# Patient Record
Sex: Male | Born: 1980
Health system: Southern US, Community
[De-identification: ages and names within clinical notes are randomized; demographics above are authoritative.]

## PROBLEM LIST (undated history)

## (undated) DIAGNOSIS — M549 Dorsalgia, unspecified: Secondary | ICD-10-CM

## (undated) DIAGNOSIS — F1120 Opioid dependence, uncomplicated: Secondary | ICD-10-CM

## (undated) DIAGNOSIS — F988 Other specified behavioral and emotional disorders with onset usually occurring in childhood and adolescence: Secondary | ICD-10-CM

## (undated) DIAGNOSIS — F319 Bipolar disorder, unspecified: Secondary | ICD-10-CM

## (undated) DIAGNOSIS — G629 Polyneuropathy, unspecified: Secondary | ICD-10-CM

## (undated) DIAGNOSIS — G8929 Other chronic pain: Secondary | ICD-10-CM

## (undated) HISTORY — PX: MOUTH SURGERY: SHX715

## (undated) HISTORY — PX: INCISION AND DRAINAGE: SHX5863

---

## 2006-02-05 ENCOUNTER — Ambulatory Visit: Payer: Self-pay | Admitting: "Endocrinology

## 2006-02-13 ENCOUNTER — Encounter: Admission: RE | Admit: 2006-02-13 | Discharge: 2006-02-13 | Payer: Self-pay | Admitting: "Endocrinology

## 2006-09-16 ENCOUNTER — Emergency Department (HOSPITAL_COMMUNITY): Admission: EM | Admit: 2006-09-16 | Discharge: 2006-09-16 | Payer: Self-pay | Admitting: Emergency Medicine

## 2007-12-19 ENCOUNTER — Ambulatory Visit (HOSPITAL_COMMUNITY): Admission: RE | Admit: 2007-12-19 | Discharge: 2007-12-19 | Payer: Self-pay | Admitting: Pulmonary Disease

## 2008-03-16 ENCOUNTER — Inpatient Hospital Stay (HOSPITAL_COMMUNITY): Admission: EM | Admit: 2008-03-16 | Discharge: 2008-03-17 | Payer: Self-pay | Admitting: Emergency Medicine

## 2008-04-13 ENCOUNTER — Emergency Department (HOSPITAL_COMMUNITY): Admission: EM | Admit: 2008-04-13 | Discharge: 2008-04-13 | Payer: Self-pay | Admitting: Emergency Medicine

## 2010-06-18 ENCOUNTER — Emergency Department (HOSPITAL_COMMUNITY): Admission: EM | Admit: 2010-06-18 | Discharge: 2010-06-18 | Payer: Self-pay | Admitting: Emergency Medicine

## 2010-06-19 ENCOUNTER — Inpatient Hospital Stay (HOSPITAL_COMMUNITY): Admission: AD | Admit: 2010-06-19 | Discharge: 2010-06-24 | Payer: Self-pay | Admitting: Pulmonary Disease

## 2010-06-19 ENCOUNTER — Ambulatory Visit: Payer: Self-pay | Admitting: Internal Medicine

## 2010-06-19 ENCOUNTER — Ambulatory Visit: Payer: Self-pay | Admitting: Cardiology

## 2010-06-20 ENCOUNTER — Encounter (INDEPENDENT_AMBULATORY_CARE_PROVIDER_SITE_OTHER): Payer: Self-pay | Admitting: Internal Medicine

## 2010-07-26 ENCOUNTER — Inpatient Hospital Stay (HOSPITAL_COMMUNITY): Admission: EM | Admit: 2010-07-26 | Discharge: 2010-07-28 | Payer: Self-pay | Admitting: Emergency Medicine

## 2010-08-12 ENCOUNTER — Ambulatory Visit: Payer: Self-pay | Admitting: Psychiatry

## 2010-12-29 ENCOUNTER — Emergency Department (HOSPITAL_COMMUNITY)
Admission: EM | Admit: 2010-12-29 | Discharge: 2010-12-30 | Disposition: A | Discharge status: Home / Self Care | Payer: Self-pay | Attending: Emergency Medicine | Admitting: Emergency Medicine

## 2010-12-29 DIAGNOSIS — E1069 Type 1 diabetes mellitus with other specified complication: Secondary | ICD-10-CM | POA: Insufficient documentation

## 2010-12-29 DIAGNOSIS — Z794 Long term (current) use of insulin: Secondary | ICD-10-CM | POA: Insufficient documentation

## 2010-12-29 DIAGNOSIS — E860 Dehydration: Secondary | ICD-10-CM | POA: Insufficient documentation

## 2010-12-29 LAB — DIFFERENTIAL
Basophils Absolute: 0 10*3/uL (ref 0.0–0.1)
Basophils Relative: 0 % (ref 0–1)
Lymphocytes Relative: 24 % (ref 12–46)
Monocytes Relative: 3 % (ref 3–12)
Neutro Abs: 7.5 10*3/uL (ref 1.7–7.7)
Neutrophils Relative %: 70 % (ref 43–77)

## 2010-12-29 LAB — CBC
HCT: 40.3 % (ref 39.0–52.0)
Hemoglobin: 13.2 g/dL (ref 13.0–17.0)
RBC: 4.67 MIL/uL (ref 4.22–5.81)

## 2010-12-29 LAB — HEPATIC FUNCTION PANEL
Albumin: 3.9 g/dL (ref 3.5–5.2)
Total Protein: 6.9 g/dL (ref 6.0–8.3)

## 2010-12-29 LAB — BLOOD GAS, VENOUS
FIO2: 0.21 %
O2 Saturation: 73.7 %
pCO2, Ven: 41.5 mmHg — ABNORMAL LOW (ref 45.0–50.0)

## 2010-12-29 LAB — URINALYSIS, ROUTINE W REFLEX MICROSCOPIC
Leukocytes, UA: NEGATIVE
Urine Glucose, Fasting: >1000 mg/dL — AB
pH: 6 (ref 5.0–8.0)

## 2010-12-29 LAB — POCT I-STAT, CHEM 8
BUN: 23 mg/dL (ref 6–23)
Calcium, Ion: 1.12 mmol/L (ref 1.12–1.32)
Chloride: 100 mEq/L (ref 96–112)
HCT: 38 % — ABNORMAL LOW (ref 39.0–52.0)
Potassium: 3.6 mEq/L (ref 3.5–5.1)
TCO2: 23 mmol/L (ref 0–100)

## 2010-12-29 LAB — URINE MICROSCOPIC-ADD ON

## 2010-12-29 LAB — BASIC METABOLIC PANEL
Chloride: 90 mEq/L — ABNORMAL LOW (ref 96–112)
Creatinine, Ser: 1.69 mg/dL — ABNORMAL HIGH (ref 0.4–1.5)
GFR calc Af Amer: 58 mL/min — ABNORMAL LOW (ref >60)
Potassium: 6.7 mEq/L (ref 3.5–5.1)
Sodium: 126 mEq/L — ABNORMAL LOW (ref 135–145)

## 2010-12-29 LAB — RAPID URINE DRUG SCREEN, HOSP PERFORMED
Barbiturates: NOT DETECTED
Benzodiazepines: POSITIVE — AB

## 2010-12-29 LAB — GLUCOSE, CAPILLARY: Glucose-Capillary: 552 mg/dL (ref 70–99)

## 2010-12-29 LAB — ETHANOL: Alcohol, Ethyl (B): <5 mg/dL (ref 0–10)

## 2010-12-29 LAB — LIPASE, BLOOD: Lipase: 19 U/L (ref 11–59)

## 2010-12-30 LAB — POCT I-STAT, CHEM 8
BUN: 23 mg/dL (ref 6–23)
Calcium, Ion: 1.13 mmol/L (ref 1.12–1.32)
Creatinine, Ser: 1.1 mg/dL (ref 0.4–1.5)
Glucose, Bld: 197 mg/dL — ABNORMAL HIGH (ref 70–99)
Hemoglobin: 12.2 g/dL — ABNORMAL LOW (ref 13.0–17.0)
TCO2: 22 mmol/L (ref 0–100)

## 2011-01-07 ENCOUNTER — Inpatient Hospital Stay (HOSPITAL_COMMUNITY)
Admission: EM | Admit: 2011-01-07 | Discharge: 2011-01-10 | DRG: 638 | Disposition: A | Discharge status: Home / Self Care | Payer: Self-pay | Attending: Pulmonary Disease | Admitting: Pulmonary Disease

## 2011-01-07 DIAGNOSIS — Z5989 Other problems related to housing and economic circumstances: Secondary | ICD-10-CM

## 2011-01-07 DIAGNOSIS — Z5987 Material hardship due to limited financial resources, not elsewhere classified: Secondary | ICD-10-CM

## 2011-01-07 DIAGNOSIS — E1065 Type 1 diabetes mellitus with hyperglycemia: Principal | ICD-10-CM | POA: Diagnosis present

## 2011-01-07 DIAGNOSIS — Z598 Other problems related to housing and economic circumstances: Secondary | ICD-10-CM

## 2011-01-07 DIAGNOSIS — G8929 Other chronic pain: Secondary | ICD-10-CM | POA: Diagnosis present

## 2011-01-07 DIAGNOSIS — Z794 Long term (current) use of insulin: Secondary | ICD-10-CM

## 2011-01-07 DIAGNOSIS — M79609 Pain in unspecified limb: Secondary | ICD-10-CM | POA: Diagnosis present

## 2011-01-07 DIAGNOSIS — M549 Dorsalgia, unspecified: Secondary | ICD-10-CM | POA: Diagnosis present

## 2011-01-07 DIAGNOSIS — IMO0002 Reserved for concepts with insufficient information to code with codable children: Principal | ICD-10-CM | POA: Diagnosis present

## 2011-01-07 DIAGNOSIS — E871 Hypo-osmolality and hyponatremia: Secondary | ICD-10-CM | POA: Diagnosis present

## 2011-01-07 LAB — DIFFERENTIAL
Basophils Relative: 0 % (ref 0–1)
Eosinophils Absolute: 0.2 10*3/uL (ref 0.0–0.7)
Eosinophils Relative: 1 % (ref 0–5)
Lymphs Abs: 2.8 10*3/uL (ref 0.7–4.0)
Neutrophils Relative %: 72 % (ref 43–77)

## 2011-01-07 LAB — GLUCOSE, CAPILLARY
Glucose-Capillary: >600 mg/dL (ref 70–99)
Glucose-Capillary: >600 mg/dL (ref 70–99)
Glucose-Capillary: 242 mg/dL — ABNORMAL HIGH (ref 70–99)
Glucose-Capillary: 231 mg/dL — ABNORMAL HIGH (ref 70–99)
Glucose-Capillary: 121 mg/dL — ABNORMAL HIGH (ref 70–99)
Glucose-Capillary: 79 mg/dL (ref 70–99)

## 2011-01-07 LAB — BASIC METABOLIC PANEL
BUN: 16 mg/dL (ref 6–23)
Creatinine, Ser: 1.02 mg/dL (ref 0.4–1.5)
GFR calc Af Amer: >60 mL/min (ref >60)
GFR calc non Af Amer: >60 mL/min (ref >60)
Potassium: 5 mEq/L (ref 3.5–5.1)

## 2011-01-07 LAB — CBC
MCH: 27.8 pg (ref 26.0–34.0)
MCV: 86.6 fL (ref 78.0–100.0)
Platelets: 303 10*3/uL (ref 150–400)
RDW: 14.6 % (ref 11.5–15.5)
WBC: 12.9 10*3/uL — ABNORMAL HIGH (ref 4.0–10.5)

## 2011-01-07 LAB — URINALYSIS, ROUTINE W REFLEX MICROSCOPIC
Hgb urine dipstick: NEGATIVE
Leukocytes, UA: NEGATIVE
Protein, ur: NEGATIVE mg/dL
Specific Gravity, Urine: 1.01 (ref 1.005–1.030)
Urine Glucose, Fasting: >1000 mg/dL — AB
pH: 7 (ref 5.0–8.0)

## 2011-01-07 LAB — BLOOD GAS, ARTERIAL
Acid-base deficit: 0.6 mmol/L (ref 0.0–2.0)
Bicarbonate: 23.2 mEq/L (ref 20.0–24.0)
O2 Saturation: 97.6 %
TCO2: 21 mmol/L (ref 0–100)
pCO2 arterial: 36.2 mmHg (ref 35.0–45.0)
pO2, Arterial: 91 mmHg (ref 80.0–100.0)

## 2011-01-07 LAB — URINE MICROSCOPIC-ADD ON

## 2011-01-07 LAB — MRSA PCR SCREENING: MRSA by PCR: NEGATIVE

## 2011-01-07 LAB — KETONES, QUALITATIVE

## 2011-01-08 LAB — GLUCOSE, CAPILLARY
Glucose-Capillary: 72 mg/dL (ref 70–99)
Glucose-Capillary: 95 mg/dL (ref 70–99)

## 2011-01-08 LAB — COMPREHENSIVE METABOLIC PANEL
ALT: 31 U/L (ref 0–53)
AST: 21 U/L (ref 0–37)
Albumin: 3.3 g/dL — ABNORMAL LOW (ref 3.5–5.2)
Alkaline Phosphatase: 58 U/L (ref 39–117)
Chloride: 100 mEq/L (ref 96–112)
Creatinine, Ser: 0.75 mg/dL (ref 0.4–1.5)
GFR calc Af Amer: >60 mL/min (ref >60)
Potassium: 3.4 mEq/L — ABNORMAL LOW (ref 3.5–5.1)
Sodium: 136 mEq/L (ref 135–145)
Total Bilirubin: 0.5 mg/dL (ref 0.3–1.2)

## 2011-01-09 LAB — GLUCOSE, CAPILLARY
Glucose-Capillary: 112 mg/dL — ABNORMAL HIGH (ref 70–99)
Glucose-Capillary: 232 mg/dL — ABNORMAL HIGH (ref 70–99)
Glucose-Capillary: 238 mg/dL — ABNORMAL HIGH (ref 70–99)
Glucose-Capillary: 413 mg/dL — ABNORMAL HIGH (ref 70–99)
Glucose-Capillary: 47 mg/dL — ABNORMAL LOW (ref 70–99)

## 2011-01-09 LAB — HEMOGLOBIN A1C: Hgb A1c MFr Bld: 10.2 % — ABNORMAL HIGH (ref <5.7)

## 2011-01-10 LAB — GLUCOSE, CAPILLARY: Glucose-Capillary: 403 mg/dL — ABNORMAL HIGH (ref 70–99)

## 2011-01-11 LAB — GLUCOSE, CAPILLARY: Glucose-Capillary: 25 mg/dL — CL (ref 70–99)

## 2011-01-15 NOTE — Discharge Summary (Signed)
  NAME:  Tyler Mann, Tyler Mann NO.:  [id number]  MEDICAL RECORD NO.:  [id number]           PATIENT TYPE:  I  LOCATION:  A210                          FACILITY:  [facility]  PHYSICIAN:  Garrison Michie L. Juanetta Gosling, M.D.DATE OF BIRTH:  1981-02-08  DATE OF ADMISSION:  01/07/2011 DATE OF DISCHARGE:  02/22/2012LH                              DISCHARGE SUMMARY   FINAL DISCHARGE DIAGNOSES: 1. Uncontrolled diabetes. 2. Chronic back and leg pain.  HISTORY:  This is a 30 year old who came to the emergency room with elevated blood sugar of about 762.  He said he had increasing pain in his legs and had increased blood sugar because of that.  He has been taking Neurontin and narcotics.  He treats his diabetes mostly with a sliding scale and with NPH.  He has significant financial difficulties and has been unable to afford some of his medications which has caused some of his problems with his blood sugar.  Exam showed he was alert, in no acute distress.  Pupils were reactive. Nose and throat were clear.  Neck was supple.  His blood sugar was 762. Sodium was 127, potassium of 5, bicarb of 23, BUN of 16, creatinine 1.02.  Blood gas showed a pH of 7.42, pCO2 of 36, pO2 of 91.  Urine showed a lot of glucose.  White blood count was 12,900, hemoglobin of 12, platelets 303.  HOSPITAL COURSE:  He was treated with insulin given IV fluids and pain medications.  Consultation was obtained with Dr. Fransico Him, endocrinologist and Dr. Fransico Him made a number of adjustments in his medications.  By the time of discharge, he was in much improved condition and he was discharged home on acetaminophen 325 mg 4 times a day as needed for pain, gabapentin 300 mg 2 tablets 4 times a day, NovoLog 10 units 3 times a day with meals and NovoLog sliding scale in addition.  Novolin N 6 units daily before supper and 8 units before breakfast, zolpidem 10 mg at bedtime and hydrocodone 10/325 q.i.d. p.r.n. pain.  He was  given prescriptions for his hydrocodone, Ambien and gabapentin with the understanding that he will need to follow in my office.  I did not give him any refills and he will need to have a urine drug screen at that point.     Shelbey Spindler L. Juanetta Gosling, M.D.     ELH/MEDQ  D:  01/10/2011  T:  01/11/2011  Job:  [id number]  Electronically Signed by Kari Baars M.D. on 01/15/2011 02:13:20 PM

## 2011-01-15 NOTE — Progress Notes (Signed)
  NAME:  Tyler Mann, Tyler Mann NO.:  [id number]  MEDICAL RECORD NO.:  [id number]           PATIENT TYPE:  I  LOCATION:  IC11                          FACILITY:  [facility]  PHYSICIAN:  Elmo Rio L. Juanetta Gosling, M.D.DATE OF BIRTH:  02-20-1981  DATE OF PROCEDURE: DATE OF DISCHARGE:                                PROGRESS NOTE   [name] was admitted yesterday with uncontrolled diabetes.  He has been using narcotics off the street and Neurontin off the street.  He says he is addicted to pain medication, I believe that he is.  He has been through rehab on several occasions and has not been successful yet. I asked him today if he would be willing to consider rehab again and he has not made a comment to that.  His blood sugar still up.  Overall, he looks about the same.  His chest is relatively clear.  Blood pressure in the 120s.  His blood sugar is better.  He is off the Glucommander at this point and my plan then, I am going to go and ask Dr. Fransico Him, the endocrinologist to take a look at it.  I have added some Neurontin and I will ask Social Work to see him and see if he is going to make any sort of a commitment to rehabilitation.     Damoney Julia L. Juanetta Gosling, M.D.     Gwenlyn Found  D:  01/08/2011  T:  01/08/2011  Job:  [id number]  Electronically Signed by Kari Baars M.D. on 01/15/2011 02:13:24 PM

## 2011-01-15 NOTE — Progress Notes (Signed)
  NAME:  Tyler Mann, Tyler Mann NO.:  [id number]  MEDICAL RECORD NO.:  [id number]           PATIENT TYPE:  LOCATION:                                 FACILITY:  PHYSICIAN:  Ashyia Schraeder L. Juanetta Gosling, M.D.DATE OF BIRTH:  [birth year]  DATE OF PROCEDURE: DATE OF DISCHARGE:                                PROGRESS NOTE   ADDENDUM  [name]'s mother called me after I spoke to him and said that she thinks that he is committed to going to rehab that they had discussed the possibility of going to [facility] for help with detox from narcotics, but also so that he will have someone to manage his blood sugar.  I think if we can make that work, that is certainly a reasonable possibility.  I would like for [name] to make it definite commitment to try this, but I have gone ahead and talked to our social worker about seeing if anything is available.     Alexys Lobello L. Juanetta Gosling, M.D.     Gwenlyn Found  D:  01/08/2011  T:  01/08/2011  Job:  [id number]  Electronically Signed by Kari Baars M.D. on 01/15/2011 02:13:30 PM

## 2011-01-15 NOTE — Progress Notes (Signed)
  NAME:  DEMETRIA, LIGHTSEY NO.:  [id number]  MEDICAL RECORD NO.:  [id number]           PATIENT TYPE:  I  LOCATION:  IC11                          FACILITY:  [facility]  PHYSICIAN:  Jess Sulak L. Juanetta Gosling, M.D.DATE OF BIRTH:  [birth year]  DATE OF PROCEDURE: DATE OF DISCHARGE:                                PROGRESS NOTE   Mr. Lothrop is better.  He continues to have some problems with his blood sugar and he had a blood sugar of 301 at 1:15 in the morning, 47 at midnight, last night 95 at 2200 last night.  This morning which is not out yet.  Otherwise, he looks about the same.  He says he did not sleep very well last night and is requesting something for sleep.  I think overall he is improved.  He is still hyperglycemic, but still working on trying to see if we can do something with his rehabilitation status.  I think he can move from the step-down unit today.     Ivan Maskell L. Juanetta Gosling, M.D.     ELH/MEDQ  D:  01/09/2011  T:  01/09/2011  Job:  [id number]  Electronically Signed by Kari Baars M.D. on 01/15/2011 02:13:35 PM

## 2011-01-15 NOTE — Progress Notes (Signed)
  NAME:  Tyler Mann, Tyler Mann NO.:  [id number]  MEDICAL RECORD NO.:  [id number]           PATIENT TYPE:  I  LOCATION:  A210                          FACILITY:  [facility]  PHYSICIAN:  Mason Burleigh L. Juanetta Gosling, M.D.DATE OF BIRTH:  24-Jan-1981  DATE OF PROCEDURE: DATE OF DISCHARGE:  01/10/2011                                PROGRESS NOTE   [name] is overall about the same.  He still has blood sugars that have been going up and down, but he looks better and he wants to go home.  I am going to go ahead and discussed this with Dr. Fransico Him and after my discussion with Dr. Fransico Him, he did agree that [name] could to be discharged, so I am going to plan to discharge him home.  Please see discharge summary for details.  He will follow up in my office and with Dr. Fransico Him.     Nelle Sayed L. Juanetta Gosling, M.D.     ELH/MEDQ  D:  01/10/2011  T:  01/10/2011  Job:  [id number]  Electronically Signed by Kari Baars M.D. on 01/15/2011 02:13:39 PM

## 2011-01-21 NOTE — H&P (Signed)
NAME:  Tyler Mann, Tyler Mann NO.:  [id number]  MEDICAL RECORD NO.:  [id number]          PATIENT TYPE:  LOCATION:                                 FACILITY:  PHYSICIAN:  Kingsley Callander. Ouida Sills, MD       DATE OF BIRTH:  Jul 13, 1981  DATE OF ADMISSION: DATE OF DISCHARGE:  LH                             HISTORY & PHYSICAL   CHIEF COMPLAINT:  High blood sugar and pain in the legs.  HISTORY OF PRESENT ILLNESS:  This patient is a 30 year old white male patient of Dr. Juanetta Gosling who presented to the emergency room with an elevated blood sugar found to be 762 on his MET-7.  He complained of his glucose being higher because of the increasing pain in his legs which he attributes to being off of street procured narcotics.  The patient describes a history of diabetes for approximately 6 years.  He has had neuropathic pain in his legs which he has resorted to treating with primarily Opana which he uses 2 doses of approximately 150 mg daily when he is able to get off of the street.  He last took any pain medications approximately 36 hours prior to presentation.  He describes having recently been in the emergency room with his glucose over 700 approximately a week ago.  He has taken Neurontin often at doses as high as 1600 mg at a the time for pain in his legs.  He has not had a prescription for either or Neurontin.  He treats his diabetes with a sliding scale.  He uses doses of NPH and regular insulin.  PAST MEDICAL HISTORY: 1. Diabetes. 2. Drug abuse.  He denies any recent IV drug use.  He has most     recently used marijuana.  He states has been off of cocaine since a     stent and rehab.  He was last hospitalized at [facility] with DKA     approximately 9 months ago, he states.  MEDICATIONS:  NPH and regular insulin.  ALLERGIES:  None.  SOCIAL HISTORY:  He denies alcohol use.  He is a smoker.  He denies any other drug use now.  He has worked as a Investment banker, operational.  FAMILY HISTORY:  Remarkable for  multiple sclerosis in his mother.  REVIEW OF SYSTEMS:  No fever, vomiting, syncope, or chest pain, states that he generally feels terrible all over.  PHYSICAL EXAMINATION:  VITAL SIGNS:  Temperature 98.0, blood pressure 135/85, pulse 84, respirations 18, oxygen saturation 100%. GENERAL:  Alert and in no acute distress. HEENT:  Pupils are dilated and reactive.  Oropharynx unremarkable. NECK:  Supple with no JVD or thyromegaly. LUNGS:  Clear. HEART:  Regular with no murmurs. ABDOMEN:  Nontender with no hepatosplenomegaly. GU: Deferred. EXTREMITIES:  No cyanosis, clubbing, or edema. NEURO:  No focal weakness. LYMPH NODES:  No cervical or supraclavicular enlargement. SKIN:  Multiple scabbed excoriations are present on his extremities.  LABORATORY DATA:  Glucose 762.  Sodium 127, potassium 5.0, bicarb 23, calcium 8.7, BUN 16, creatinine 1.02.  His ABG reveals a pH of 7.42, pCO2 36, and pO2 of 91 with  a bicarb of 21.  Urinalysis reveals greater than 100 mg per dL of glucose with trace ketones and negative protein. His blood acetone is small.  CBC reveals a white count 12.9 with a hemoglobin of 12, and platelets of 303,000  IMPRESSION/PLAN: 1. Uncontrolled diabetes.  He is not acidotic but is quite     hyperglycemic.  He will be admitted and treated with IV fluids and     IV insulin using the Glucommander protocol. 2. Drug abuse.  He will be treated with IV morphine initially on an as-     needed basis.  He will be started on a regular dosing schedule of     Neurontin. 3. Hyponatremia. 4. History of IV drug use.  He states he has been HIV negative.     Kingsley Callander. Ouida Sills, MD     ROF/MEDQ  D:  01/07/2011  T:  01/08/2011  Job:  [id number]  Electronically Signed by Carylon Perches MD on 01/21/2011 11:18:24 PM

## 2011-01-27 ENCOUNTER — Inpatient Hospital Stay (HOSPITAL_COMMUNITY)
Admission: EM | Admit: 2011-01-27 | Discharge: 2011-01-31 | DRG: 639 | Disposition: A | Discharge status: Home / Self Care | Payer: Self-pay | Attending: Pulmonary Disease | Admitting: Pulmonary Disease

## 2011-01-27 DIAGNOSIS — E1010 Type 1 diabetes mellitus with ketoacidosis without coma: Principal | ICD-10-CM | POA: Diagnosis present

## 2011-01-27 DIAGNOSIS — F1410 Cocaine abuse, uncomplicated: Secondary | ICD-10-CM | POA: Diagnosis present

## 2011-01-27 DIAGNOSIS — G8929 Other chronic pain: Secondary | ICD-10-CM | POA: Diagnosis present

## 2011-01-27 DIAGNOSIS — Z794 Long term (current) use of insulin: Secondary | ICD-10-CM

## 2011-01-27 DIAGNOSIS — E860 Dehydration: Secondary | ICD-10-CM | POA: Diagnosis present

## 2011-01-27 DIAGNOSIS — F1210 Cannabis abuse, uncomplicated: Secondary | ICD-10-CM | POA: Diagnosis present

## 2011-01-27 DIAGNOSIS — F1110 Opioid abuse, uncomplicated: Secondary | ICD-10-CM | POA: Diagnosis present

## 2011-01-27 LAB — BASIC METABOLIC PANEL
BUN: 18 mg/dL (ref 6–23)
BUN: 15 mg/dL (ref 6–23)
CO2: 11 mEq/L — ABNORMAL LOW (ref 19–32)
Chloride: 93 mEq/L — ABNORMAL LOW (ref 96–112)
Chloride: 105 mEq/L (ref 96–112)
Creatinine, Ser: 1.66 mg/dL — ABNORMAL HIGH (ref 0.4–1.5)
GFR calc non Af Amer: >60 mL/min (ref >60)
Glucose, Bld: 623 mg/dL (ref 70–99)
Glucose, Bld: 201 mg/dL — ABNORMAL HIGH (ref 70–99)
Potassium: 4.6 mEq/L (ref 3.5–5.1)
Sodium: 138 mEq/L (ref 135–145)

## 2011-01-27 LAB — GLUCOSE, CAPILLARY
Glucose-Capillary: 167 mg/dL — ABNORMAL HIGH (ref 70–99)
Glucose-Capillary: 235 mg/dL — ABNORMAL HIGH (ref 70–99)
Glucose-Capillary: 250 mg/dL — ABNORMAL HIGH (ref 70–99)
Glucose-Capillary: 126 mg/dL — ABNORMAL HIGH (ref 70–99)

## 2011-01-27 LAB — URINALYSIS, ROUTINE W REFLEX MICROSCOPIC
Ketones, ur: >80 mg/dL — AB
Leukocytes, UA: NEGATIVE
Protein, ur: NEGATIVE mg/dL
Urobilinogen, UA: 0.2 mg/dL (ref 0.0–1.0)

## 2011-01-27 LAB — DIFFERENTIAL
Eosinophils Absolute: 0.2 10*3/uL (ref 0.0–0.7)
Lymphs Abs: 4 10*3/uL (ref 0.7–4.0)
Monocytes Relative: 4 % (ref 3–12)
Neutro Abs: 12.5 10*3/uL — ABNORMAL HIGH (ref 1.7–7.7)
Neutrophils Relative %: 72 % (ref 43–77)

## 2011-01-27 LAB — CBC
HCT: 41.7 % (ref 39.0–52.0)
Hemoglobin: 13.5 g/dL (ref 13.0–17.0)
MCH: 28.1 pg (ref 26.0–34.0)
MCV: 86.7 fL (ref 78.0–100.0)
RBC: 4.81 MIL/uL (ref 4.22–5.81)

## 2011-01-27 LAB — URINE MICROSCOPIC-ADD ON

## 2011-01-27 LAB — MRSA PCR SCREENING: MRSA by PCR: NEGATIVE

## 2011-01-28 LAB — GLUCOSE, CAPILLARY
Glucose-Capillary: 104 mg/dL — ABNORMAL HIGH (ref 70–99)
Glucose-Capillary: 108 mg/dL — ABNORMAL HIGH (ref 70–99)
Glucose-Capillary: 114 mg/dL — ABNORMAL HIGH (ref 70–99)
Glucose-Capillary: 117 mg/dL — ABNORMAL HIGH (ref 70–99)
Glucose-Capillary: 125 mg/dL — ABNORMAL HIGH (ref 70–99)
Glucose-Capillary: 164 mg/dL — ABNORMAL HIGH (ref 70–99)
Glucose-Capillary: 234 mg/dL — ABNORMAL HIGH (ref 70–99)
Glucose-Capillary: 239 mg/dL — ABNORMAL HIGH (ref 70–99)
Glucose-Capillary: 279 mg/dL — ABNORMAL HIGH (ref 70–99)
Glucose-Capillary: 282 mg/dL — ABNORMAL HIGH (ref 70–99)
Glucose-Capillary: 320 mg/dL — ABNORMAL HIGH (ref 70–99)

## 2011-01-28 LAB — COMPREHENSIVE METABOLIC PANEL
ALT: 19 U/L (ref 0–53)
AST: 23 U/L (ref 0–37)
Albumin: 3.1 g/dL — ABNORMAL LOW (ref 3.5–5.2)
Alkaline Phosphatase: 57 U/L (ref 39–117)
BUN: 9 mg/dL (ref 6–23)
CO2: 21 mEq/L (ref 19–32)
Calcium: 8.1 mg/dL — ABNORMAL LOW (ref 8.4–10.5)
Chloride: 103 mEq/L (ref 96–112)
Creatinine, Ser: 1.05 mg/dL (ref 0.4–1.5)
GFR calc Af Amer: >60 mL/min (ref >60)
GFR calc non Af Amer: >60 mL/min (ref >60)
Glucose, Bld: 306 mg/dL — ABNORMAL HIGH (ref 70–99)
Potassium: 3.9 mEq/L (ref 3.5–5.1)
Sodium: 133 mEq/L — ABNORMAL LOW (ref 135–145)
Total Bilirubin: 0.7 mg/dL (ref 0.3–1.2)
Total Protein: 5.7 g/dL — ABNORMAL LOW (ref 6.0–8.3)

## 2011-01-28 LAB — BASIC METABOLIC PANEL
BUN: 12 mg/dL (ref 6–23)
BUN: 13 mg/dL (ref 6–23)
CO2: 21 mEq/L (ref 19–32)
CO2: 22 mEq/L (ref 19–32)
Calcium: 8.1 mg/dL — ABNORMAL LOW (ref 8.4–10.5)
Calcium: 8.3 mg/dL — ABNORMAL LOW (ref 8.4–10.5)
Chloride: 101 mEq/L (ref 96–112)
Chloride: 104 mEq/L (ref 96–112)
Creatinine, Ser: 0.94 mg/dL (ref 0.4–1.5)
Creatinine, Ser: 0.94 mg/dL (ref 0.4–1.5)
GFR calc Af Amer: >60 mL/min (ref >60)
GFR calc Af Amer: >60 mL/min (ref >60)
GFR calc non Af Amer: >60 mL/min (ref >60)
GFR calc non Af Amer: >60 mL/min (ref >60)
Glucose, Bld: 106 mg/dL — ABNORMAL HIGH (ref 70–99)
Glucose, Bld: 127 mg/dL — ABNORMAL HIGH (ref 70–99)
Potassium: 3.6 mEq/L (ref 3.5–5.1)
Potassium: 3.9 mEq/L (ref 3.5–5.1)
Sodium: 130 mEq/L — ABNORMAL LOW (ref 135–145)
Sodium: 135 mEq/L (ref 135–145)

## 2011-01-29 LAB — COMPREHENSIVE METABOLIC PANEL
ALT: 19 U/L (ref 0–53)
ALT: 24 U/L (ref 0–53)
AST: 31 U/L (ref 0–37)
CO2: 26 mEq/L (ref 19–32)
Calcium: 8.5 mg/dL (ref 8.4–10.5)
Chloride: 102 mEq/L (ref 96–112)
GFR calc Af Amer: >60 mL/min (ref >60)
GFR calc Af Amer: >60 mL/min (ref >60)
GFR calc non Af Amer: >60 mL/min (ref >60)
Glucose, Bld: 82 mg/dL (ref 70–99)
Potassium: 3.6 mEq/L (ref 3.5–5.1)
Sodium: 138 mEq/L (ref 135–145)
Sodium: 138 mEq/L (ref 135–145)
Total Bilirubin: 0.5 mg/dL (ref 0.3–1.2)
Total Protein: 6.1 g/dL (ref 6.0–8.3)

## 2011-01-29 LAB — GLUCOSE, CAPILLARY
Glucose-Capillary: 149 mg/dL — ABNORMAL HIGH (ref 70–99)
Glucose-Capillary: 205 mg/dL — ABNORMAL HIGH (ref 70–99)
Glucose-Capillary: 220 mg/dL — ABNORMAL HIGH (ref 70–99)
Glucose-Capillary: 380 mg/dL — ABNORMAL HIGH (ref 70–99)
Glucose-Capillary: 83 mg/dL (ref 70–99)
Glucose-Capillary: 87 mg/dL (ref 70–99)

## 2011-01-30 LAB — COMPREHENSIVE METABOLIC PANEL
ALT: 28 U/L (ref 0–53)
Alkaline Phosphatase: 63 U/L (ref 39–117)
BUN: 7 mg/dL (ref 6–23)
BUN: 7 mg/dL (ref 6–23)
CO2: 26 mEq/L (ref 19–32)
Calcium: 9.1 mg/dL (ref 8.4–10.5)
Creatinine, Ser: 0.77 mg/dL (ref 0.4–1.5)
GFR calc non Af Amer: >60 mL/min (ref >60)
Glucose, Bld: 240 mg/dL — ABNORMAL HIGH (ref 70–99)
Glucose, Bld: 163 mg/dL — ABNORMAL HIGH (ref 70–99)
Potassium: 3.9 mEq/L (ref 3.5–5.1)
Total Protein: 6.9 g/dL (ref 6.0–8.3)

## 2011-01-30 LAB — GLUCOSE, CAPILLARY
Glucose-Capillary: 217 mg/dL — ABNORMAL HIGH (ref 70–99)
Glucose-Capillary: 69 mg/dL — ABNORMAL LOW (ref 70–99)
Glucose-Capillary: 80 mg/dL (ref 70–99)
Glucose-Capillary: 90 mg/dL (ref 70–99)

## 2011-01-31 LAB — BASIC METABOLIC PANEL
Chloride: 101 mEq/L (ref 96–112)
Creatinine, Ser: 0.71 mg/dL (ref 0.4–1.5)
GFR calc Af Amer: >60 mL/min (ref >60)
Potassium: 4.4 mEq/L (ref 3.5–5.1)

## 2011-01-31 LAB — CBC
Platelets: 307 10*3/uL (ref 150–400)
RBC: 4.14 MIL/uL — ABNORMAL LOW (ref 4.22–5.81)
WBC: 7.9 10*3/uL (ref 4.0–10.5)

## 2011-01-31 LAB — COMPREHENSIVE METABOLIC PANEL
BUN: 6 mg/dL (ref 6–23)
CO2: 33 mEq/L — ABNORMAL HIGH (ref 19–32)
Chloride: 101 mEq/L (ref 96–112)
Creatinine, Ser: 0.64 mg/dL (ref 0.4–1.5)
GFR calc non Af Amer: >60 mL/min (ref >60)
Total Bilirubin: 0.5 mg/dL (ref 0.3–1.2)

## 2011-01-31 LAB — DIFFERENTIAL
Basophils Relative: 1 % (ref 0–1)
Eosinophils Absolute: 0.2 10*3/uL (ref 0.0–0.7)
Neutrophils Relative %: 49 % (ref 43–77)

## 2011-01-31 LAB — GLUCOSE, CAPILLARY
Glucose-Capillary: 104 mg/dL — ABNORMAL HIGH (ref 70–99)
Glucose-Capillary: 149 mg/dL — ABNORMAL HIGH (ref 70–99)
Glucose-Capillary: 125 mg/dL — ABNORMAL HIGH (ref 70–99)

## 2011-02-01 LAB — GLUCOSE, CAPILLARY
Glucose-Capillary: 113 mg/dL — ABNORMAL HIGH (ref 70–99)
Glucose-Capillary: 116 mg/dL — ABNORMAL HIGH (ref 70–99)
Glucose-Capillary: 139 mg/dL — ABNORMAL HIGH (ref 70–99)
Glucose-Capillary: 149 mg/dL — ABNORMAL HIGH (ref 70–99)
Glucose-Capillary: 151 mg/dL — ABNORMAL HIGH (ref 70–99)
Glucose-Capillary: 158 mg/dL — ABNORMAL HIGH (ref 70–99)
Glucose-Capillary: 168 mg/dL — ABNORMAL HIGH (ref 70–99)
Glucose-Capillary: 207 mg/dL — ABNORMAL HIGH (ref 70–99)
Glucose-Capillary: 250 mg/dL — ABNORMAL HIGH (ref 70–99)
Glucose-Capillary: 260 mg/dL — ABNORMAL HIGH (ref 70–99)
Glucose-Capillary: 291 mg/dL — ABNORMAL HIGH (ref 70–99)
Glucose-Capillary: 426 mg/dL — ABNORMAL HIGH (ref 70–99)
Glucose-Capillary: 99 mg/dL (ref 70–99)
Glucose-Capillary: >600 mg/dL (ref 70–99)

## 2011-02-01 LAB — COMPREHENSIVE METABOLIC PANEL
ALT: 59 U/L — ABNORMAL HIGH (ref 0–53)
AST: 27 U/L (ref 0–37)
Albumin: 4.1 g/dL (ref 3.5–5.2)
CO2: 9 mEq/L — CL (ref 19–32)
Calcium: 9.3 mg/dL (ref 8.4–10.5)
GFR calc Af Amer: 41 mL/min — ABNORMAL LOW (ref >60)
GFR calc non Af Amer: 34 mL/min — ABNORMAL LOW (ref >60)
Sodium: 130 mEq/L — ABNORMAL LOW (ref 135–145)

## 2011-02-01 LAB — BASIC METABOLIC PANEL
BUN: 11 mg/dL (ref 6–23)
BUN: 21 mg/dL (ref 6–23)
CO2: 13 mEq/L — ABNORMAL LOW (ref 19–32)
CO2: 17 mEq/L — ABNORMAL LOW (ref 19–32)
CO2: 20 mEq/L (ref 19–32)
CO2: 21 mEq/L (ref 19–32)
Calcium: 8.3 mg/dL — ABNORMAL LOW (ref 8.4–10.5)
Calcium: 8.4 mg/dL (ref 8.4–10.5)
Chloride: 106 mEq/L (ref 96–112)
Chloride: 106 mEq/L (ref 96–112)
Creatinine, Ser: 1.16 mg/dL (ref 0.4–1.5)
Creatinine, Ser: 1.23 mg/dL (ref 0.4–1.5)
GFR calc Af Amer: >60 mL/min (ref >60)
Glucose, Bld: 111 mg/dL — ABNORMAL HIGH (ref 70–99)
Glucose, Bld: 190 mg/dL — ABNORMAL HIGH (ref 70–99)
Glucose, Bld: 253 mg/dL — ABNORMAL HIGH (ref 70–99)
Potassium: 4.6 mEq/L (ref 3.5–5.1)
Sodium: 133 mEq/L — ABNORMAL LOW (ref 135–145)

## 2011-02-01 LAB — CBC
HCT: 33.7 % — ABNORMAL LOW (ref 39.0–52.0)
Hemoglobin: 13.2 g/dL (ref 13.0–17.0)
MCHC: 32.8 g/dL (ref 30.0–36.0)
MCHC: 34.2 g/dL (ref 30.0–36.0)
MCV: 86.4 fL (ref 78.0–100.0)
Platelets: 398 10*3/uL (ref 150–400)
RBC: 4.53 MIL/uL (ref 4.22–5.81)
RDW: 13.8 % (ref 11.5–15.5)

## 2011-02-01 LAB — RAPID URINE DRUG SCREEN, HOSP PERFORMED
Amphetamines: NOT DETECTED
Barbiturates: NOT DETECTED
Benzodiazepines: NOT DETECTED
Opiates: NOT DETECTED

## 2011-02-01 LAB — BLOOD GAS, ARTERIAL
Acid-base deficit: 23 mmol/L — ABNORMAL HIGH (ref 0.0–2.0)
Bicarbonate: 4.9 mEq/L — ABNORMAL LOW (ref 20.0–24.0)
Patient temperature: 37
TCO2: 4.7 mmol/L (ref 0–100)

## 2011-02-01 LAB — URINALYSIS, ROUTINE W REFLEX MICROSCOPIC
Glucose, UA: >1000 mg/dL — AB
Hgb urine dipstick: NEGATIVE
Ketones, ur: >80 mg/dL — AB
pH: 5.5 (ref 5.0–8.0)

## 2011-02-01 LAB — MRSA PCR SCREENING: MRSA by PCR: NEGATIVE

## 2011-02-01 LAB — DIFFERENTIAL
Eosinophils Absolute: 0.2 10*3/uL (ref 0.0–0.7)
Eosinophils Relative: 1 % (ref 0–5)
Lymphs Abs: 4 10*3/uL (ref 0.7–4.0)
Monocytes Absolute: 0.7 10*3/uL (ref 0.1–1.0)
Monocytes Relative: 4 % (ref 3–12)

## 2011-02-01 NOTE — H&P (Signed)
  NAME:  Tyler Mann, Tyler Mann NO.:  [id number]  MEDICAL RECORD NO.:  [id number]           PATIENT TYPE:  I  LOCATION:  IC08                          FACILITY:  [facility]  PHYSICIAN:  Kevin Mario G. Renard Matter, MD   DATE OF BIRTH:  1981/11/14  DATE OF ADMISSION:  01/27/2011 DATE OF DISCHARGE:  LH                             HISTORY & PHYSICAL   HISTORY OF PRESENT ILLNESS:  This 30 year old white male presented to the ED with a chief complaint of vomiting.  Apparently, patient has been running high blood sugars at home, had been increasing his insulin sliding scale.  Apparently, vomiting started some 2 weeks prior to this admission.  He has chronic back pain and leg pain.  The patient does have a prior history of depression and diabetes and does have a history of drug abuse.  The patient was seen, evaluated by ED physician.  BMET showed a sodium 129, potassium 5, chloride 93, CO2 of 11, glucose 623, and creatinine 1.66.  The patient was felt to be dehydrated and in diabetic ketoacidosis.  He was started on Glucommander protocol in the ED and was started on intravenous fluids was started on insulin drip, and subsequently admitted to ICU for further evaluation and followup. He was placed in ICU step-down care.  SOCIAL HISTORY:  The patient does currently smoke.  Drinks occasionally, does abuse cannabis, cocaine abuse, crack abuse, and narcotic abuse.  No prior history of surgery.  PAST MEDICAL HISTORY:  The patient has history of juvenile diabetes, has been hospitalized on several occasions for this and remains on the care of Dr. Juanetta Gosling and Dr. Fransico Him, endocrinologist.  ALLERGIES:  No known allergies except to BEES.  MEDICATION LIST:  Hydrocodone/acetaminophen 10/650 q.i.d., Neurontin 300 mg q.i.d., Ambien 10 mg at bedtime, Humalog insulin according to sliding scale.  Levemir insulin 24 units at bedtime.  REVIEW OF SYSTEMS:  HEENT:  Negative.  CARDIOPULMONARY:  No  cough, hemoptysis, dyspnea.  GI:  The patient has been vomiting intermittently today.  GU:  No dysuria, hematuria.  PHYSICAL EXAMINATION:  GENERAL:  Alert male when examined.  Blood pressure 121/70, pulse 115, respiration 22. HEENT:  Eyes:  PERRLA.  TMs negative.  Oropharynx benign. NECK:  Supple.  No JVD or thyroid abnormalities. HEART:  Regular rhythm.  No murmurs. LUNGS:  Clear to P and A. ABDOMEN:  No palpable organs or mass.  No organomegaly. EXTREMITIES:  Free of edema. NEUROLOGICAL:  Cranial nerves intact.  No motor or sensory disturbance.  ASSESSMENT:  The patient was admitted with diabetic ketosis acidosis, hyperglycemia, chronic back and leg pain, possible neuropathy.  PLAN:  To continue glucose stabilizer, continue intravenous fluids.  We will adjust insulin dosage.  We will attempt to control his pain with Dilaudid.     Lynette Topete G. Renard Matter, MD     AGM/MEDQ  D:  01/27/2011  T:  01/28/2011  Job:  [id number]  Electronically Signed by Butch Penny MD on 02/01/2011 04:41:47 PM

## 2011-02-02 LAB — CBC
HCT: 30.6 % — ABNORMAL LOW (ref 39.0–52.0)
HCT: 33.2 % — ABNORMAL LOW (ref 39.0–52.0)
Hemoglobin: 11.6 g/dL — ABNORMAL LOW (ref 13.0–17.0)
Hemoglobin: 9.8 g/dL — ABNORMAL LOW (ref 13.0–17.0)
MCH: 28.4 pg (ref 26.0–34.0)
MCH: 28.7 pg (ref 26.0–34.0)
MCH: 29 pg (ref 26.0–34.0)
MCH: 29.4 pg (ref 26.0–34.0)
MCHC: 32 g/dL (ref 30.0–36.0)
MCHC: 33.1 g/dL (ref 30.0–36.0)
MCV: 87.1 fL (ref 78.0–100.0)
MCV: 91.2 fL (ref 78.0–100.0)
Platelets: 188 10*3/uL (ref 150–400)
Platelets: 226 10*3/uL (ref 150–400)
Platelets: 241 10*3/uL (ref 150–400)
Platelets: 282 10*3/uL (ref 150–400)
Platelets: 323 10*3/uL (ref 150–400)
RBC: 3.64 MIL/uL — ABNORMAL LOW (ref 4.22–5.81)
RBC: 3.93 MIL/uL — ABNORMAL LOW (ref 4.22–5.81)
RBC: 4.19 MIL/uL — ABNORMAL LOW (ref 4.22–5.81)
RDW: 13.4 % (ref 11.5–15.5)
RDW: 13.6 % (ref 11.5–15.5)
RDW: 14.1 % (ref 11.5–15.5)
WBC: 4.4 10*3/uL (ref 4.0–10.5)
WBC: 45.1 10*3/uL — ABNORMAL HIGH (ref 4.0–10.5)

## 2011-02-02 LAB — GLUCOSE, CAPILLARY
Glucose-Capillary: 129 mg/dL — ABNORMAL HIGH (ref 70–99)
Glucose-Capillary: 130 mg/dL — ABNORMAL HIGH (ref 70–99)
Glucose-Capillary: 137 mg/dL — ABNORMAL HIGH (ref 70–99)
Glucose-Capillary: 151 mg/dL — ABNORMAL HIGH (ref 70–99)
Glucose-Capillary: 158 mg/dL — ABNORMAL HIGH (ref 70–99)
Glucose-Capillary: 164 mg/dL — ABNORMAL HIGH (ref 70–99)
Glucose-Capillary: 164 mg/dL — ABNORMAL HIGH (ref 70–99)
Glucose-Capillary: 166 mg/dL — ABNORMAL HIGH (ref 70–99)
Glucose-Capillary: 170 mg/dL — ABNORMAL HIGH (ref 70–99)
Glucose-Capillary: 172 mg/dL — ABNORMAL HIGH (ref 70–99)
Glucose-Capillary: 179 mg/dL — ABNORMAL HIGH (ref 70–99)
Glucose-Capillary: 183 mg/dL — ABNORMAL HIGH (ref 70–99)
Glucose-Capillary: 196 mg/dL — ABNORMAL HIGH (ref 70–99)
Glucose-Capillary: 213 mg/dL — ABNORMAL HIGH (ref 70–99)
Glucose-Capillary: 230 mg/dL — ABNORMAL HIGH (ref 70–99)
Glucose-Capillary: 234 mg/dL — ABNORMAL HIGH (ref 70–99)
Glucose-Capillary: 279 mg/dL — ABNORMAL HIGH (ref 70–99)
Glucose-Capillary: 308 mg/dL — ABNORMAL HIGH (ref 70–99)
Glucose-Capillary: 40 mg/dL — CL (ref 70–99)
Glucose-Capillary: 90 mg/dL (ref 70–99)
Glucose-Capillary: 97 mg/dL (ref 70–99)
Glucose-Capillary: 99 mg/dL (ref 70–99)
Glucose-Capillary: >600 mg/dL (ref 70–99)

## 2011-02-02 LAB — BASIC METABOLIC PANEL
BUN: 10 mg/dL (ref 6–23)
BUN: 10 mg/dL (ref 6–23)
BUN: 12 mg/dL (ref 6–23)
BUN: 17 mg/dL (ref 6–23)
BUN: 5 mg/dL — ABNORMAL LOW (ref 6–23)
BUN: 8 mg/dL (ref 6–23)
BUN: 9 mg/dL (ref 6–23)
CO2: 17 mEq/L — ABNORMAL LOW (ref 19–32)
CO2: 22 mEq/L (ref 19–32)
CO2: 22 mEq/L (ref 19–32)
CO2: 23 mEq/L (ref 19–32)
CO2: 23 mEq/L (ref 19–32)
CO2: 24 mEq/L (ref 19–32)
CO2: 31 mEq/L (ref 19–32)
Calcium: 7.8 mg/dL — ABNORMAL LOW (ref 8.4–10.5)
Calcium: 8 mg/dL — ABNORMAL LOW (ref 8.4–10.5)
Calcium: 8.1 mg/dL — ABNORMAL LOW (ref 8.4–10.5)
Calcium: 8.2 mg/dL — ABNORMAL LOW (ref 8.4–10.5)
Calcium: 8.4 mg/dL (ref 8.4–10.5)
Calcium: 8.5 mg/dL (ref 8.4–10.5)
Chloride: 100 mEq/L (ref 96–112)
Chloride: 116 mEq/L — ABNORMAL HIGH (ref 96–112)
Chloride: 119 mEq/L — ABNORMAL HIGH (ref 96–112)
Chloride: 123 mEq/L — ABNORMAL HIGH (ref 96–112)
Creatinine, Ser: 0.93 mg/dL (ref 0.4–1.5)
Creatinine, Ser: 0.97 mg/dL (ref 0.4–1.5)
Creatinine, Ser: 1.1 mg/dL (ref 0.4–1.5)
Creatinine, Ser: 1.49 mg/dL (ref 0.4–1.5)
Creatinine, Ser: 1.81 mg/dL — ABNORMAL HIGH (ref 0.4–1.5)
Creatinine, Ser: 1.9 mg/dL — ABNORMAL HIGH (ref 0.4–1.5)
GFR calc Af Amer: 51 mL/min — ABNORMAL LOW (ref >60)
GFR calc Af Amer: 54 mL/min — ABNORMAL LOW (ref >60)
GFR calc Af Amer: >60 mL/min (ref >60)
GFR calc Af Amer: >60 mL/min (ref >60)
GFR calc non Af Amer: 42 mL/min — ABNORMAL LOW (ref >60)
GFR calc non Af Amer: 53 mL/min — ABNORMAL LOW (ref >60)
GFR calc non Af Amer: 55 mL/min — ABNORMAL LOW (ref >60)
GFR calc non Af Amer: >60 mL/min (ref >60)
GFR calc non Af Amer: >60 mL/min (ref >60)
GFR calc non Af Amer: >60 mL/min (ref >60)
Glucose, Bld: 131 mg/dL — ABNORMAL HIGH (ref 70–99)
Glucose, Bld: 183 mg/dL — ABNORMAL HIGH (ref 70–99)
Glucose, Bld: 42 mg/dL — CL (ref 70–99)
Glucose, Bld: 90 mg/dL (ref 70–99)
Potassium: 2.6 mEq/L — CL (ref 3.5–5.1)
Potassium: 3.4 mEq/L — ABNORMAL LOW (ref 3.5–5.1)
Potassium: 3.7 mEq/L (ref 3.5–5.1)
Potassium: 3.8 mEq/L (ref 3.5–5.1)
Sodium: 143 mEq/L (ref 135–145)
Sodium: 143 mEq/L (ref 135–145)
Sodium: 145 mEq/L (ref 135–145)
Sodium: 151 mEq/L — ABNORMAL HIGH (ref 135–145)
Sodium: 153 mEq/L — ABNORMAL HIGH (ref 135–145)

## 2011-02-02 LAB — CARBOXYHEMOGLOBIN
Carboxyhemoglobin: 1.9 % — ABNORMAL HIGH (ref 0.5–1.5)
O2 Saturation: 89.5 %

## 2011-02-02 LAB — POCT I-STAT 3, ART BLOOD GAS (G3+)
Acid-base deficit: 21 mmol/L — ABNORMAL HIGH (ref 0.0–2.0)
Bicarbonate: 22.6 mEq/L (ref 20.0–24.0)
Bicarbonate: 8.9 mEq/L — ABNORMAL LOW (ref 20.0–24.0)
O2 Saturation: 100 %
O2 Saturation: 99 %
Patient temperature: 98.7
Patient temperature: 99.2
TCO2: 10 mmol/L (ref 0–100)
pCO2 arterial: 30.1 mmHg — ABNORMAL LOW (ref 35.0–45.0)
pH, Arterial: 7.328 — ABNORMAL LOW (ref 7.350–7.450)
pH, Arterial: 7.53 — ABNORMAL HIGH (ref 7.350–7.450)
pO2, Arterial: 161 mmHg — ABNORMAL HIGH (ref 80.0–100.0)

## 2011-02-02 LAB — COMPREHENSIVE METABOLIC PANEL
ALT: 31 U/L (ref 0–53)
AST: 36 U/L (ref 0–37)
Albumin: 3.4 g/dL — ABNORMAL LOW (ref 3.5–5.2)
CO2: 10 mEq/L — ABNORMAL LOW (ref 19–32)
Chloride: 119 mEq/L — ABNORMAL HIGH (ref 96–112)
GFR calc Af Amer: 33 mL/min — ABNORMAL LOW (ref >60)
GFR calc non Af Amer: 27 mL/min — ABNORMAL LOW (ref >60)
Sodium: 148 mEq/L — ABNORMAL HIGH (ref 135–145)
Total Bilirubin: 1.2 mg/dL (ref 0.3–1.2)

## 2011-02-02 LAB — MAGNESIUM
Magnesium: 2.1 mg/dL (ref 1.5–2.5)
Magnesium: 2.4 mg/dL (ref 1.5–2.5)
Magnesium: 2.8 mg/dL — ABNORMAL HIGH (ref 1.5–2.5)

## 2011-02-02 LAB — CARDIAC PANEL(CRET KIN+CKTOT+MB+TROPI)
CK, MB: 5.1 ng/mL — ABNORMAL HIGH (ref 0.3–4.0)
CK, MB: 7.5 ng/mL (ref 0.3–4.0)
Relative Index: 1.4 (ref 0.0–2.5)
Relative Index: INVALID (ref 0.0–2.5)
Total CK: 89 U/L (ref 7–232)
Troponin I: 0.05 ng/mL (ref 0.00–0.06)

## 2011-02-02 LAB — OSMOLALITY: Osmolality: 357 mOsm/kg — ABNORMAL HIGH (ref 275–300)

## 2011-02-02 LAB — CULTURE, BLOOD (ROUTINE X 2)
Culture: NO GROWTH
Culture: NO GROWTH

## 2011-02-02 LAB — PHOSPHORUS
Phosphorus: 2.4 mg/dL (ref 2.3–4.6)
Phosphorus: 2.5 mg/dL (ref 2.3–4.6)
Phosphorus: 2.5 mg/dL (ref 2.3–4.6)

## 2011-02-02 LAB — HEPATITIS PANEL, ACUTE
HCV Ab: NEGATIVE
Hep A IgM: NEGATIVE

## 2011-02-02 LAB — PROTIME-INR: Prothrombin Time: 13.2 seconds (ref 11.6–15.2)

## 2011-02-02 LAB — PROCALCITONIN: Procalcitonin: 29.96 ng/mL

## 2011-02-02 LAB — LACTATE DEHYDROGENASE: LDH: 248 U/L (ref 94–250)

## 2011-02-02 LAB — MRSA PCR SCREENING: MRSA by PCR: NEGATIVE

## 2011-02-02 LAB — HEMOGLOBIN A1C: Mean Plasma Glucose: 283 mg/dL — ABNORMAL HIGH (ref <117)

## 2011-02-02 LAB — CK
Total CK: 464 U/L — ABNORMAL HIGH (ref 7–232)
Total CK: 483 U/L — ABNORMAL HIGH (ref 7–232)

## 2011-02-02 LAB — APTT: aPTT: 23 seconds — ABNORMAL LOW (ref 24–37)

## 2011-02-03 LAB — BLOOD GAS, ARTERIAL
Acid-base deficit: 22.2 mmol/L — ABNORMAL HIGH (ref 0.0–2.0)
Bicarbonate: 5.6 meq/L — ABNORMAL LOW (ref 20.0–24.0)
FIO2: 0.21 %
O2 Saturation: 85.4 %
Patient temperature: 37
pCO2 arterial: 17.7 mmHg — CL (ref 35.0–45.0)
pH, Arterial: 7.124 — CL (ref 7.350–7.450)
pO2, Arterial: 66.2 mmHg — ABNORMAL LOW (ref 80.0–100.0)

## 2011-02-03 LAB — GLUCOSE, CAPILLARY
Glucose-Capillary: 307 mg/dL — ABNORMAL HIGH (ref 70–99)
Glucose-Capillary: >600 mg/dL (ref 70–99)
Glucose-Capillary: >600 mg/dL (ref 70–99)

## 2011-02-03 LAB — URINALYSIS, ROUTINE W REFLEX MICROSCOPIC
Bilirubin Urine: NEGATIVE
Glucose, UA: >1000 mg/dL — AB
Ketones, ur: >80 mg/dL — AB
Leukocytes, UA: NEGATIVE
Specific Gravity, Urine: 1.02 (ref 1.005–1.030)
pH: 5.5 (ref 5.0–8.0)

## 2011-02-03 LAB — RAPID URINE DRUG SCREEN, HOSP PERFORMED
Amphetamines: NOT DETECTED
Barbiturates: NOT DETECTED
Benzodiazepines: POSITIVE — AB
Cocaine: POSITIVE — AB
Opiates: NOT DETECTED
Tetrahydrocannabinol: NOT DETECTED

## 2011-02-03 LAB — DIFFERENTIAL
Eosinophils Relative: 1 % (ref 0–5)
Lymphocytes Relative: 16 % (ref 12–46)
Lymphs Abs: 3.5 10*3/uL (ref 0.7–4.0)
Monocytes Absolute: 1 10*3/uL (ref 0.1–1.0)

## 2011-02-03 LAB — CBC
HCT: 40 % (ref 39.0–52.0)
MCV: 92.3 fL (ref 78.0–100.0)
RBC: 4.33 MIL/uL (ref 4.22–5.81)
WBC: 22.4 10*3/uL — ABNORMAL HIGH (ref 4.0–10.5)

## 2011-02-03 LAB — GLUCOSE, RANDOM: Glucose, Bld: 586 mg/dL (ref 70–99)

## 2011-02-03 LAB — BASIC METABOLIC PANEL
Chloride: 93 mEq/L — ABNORMAL LOW (ref 96–112)
GFR calc Af Amer: 46 mL/min — ABNORMAL LOW (ref >60)
Potassium: 4.9 mEq/L (ref 3.5–5.1)
Sodium: 127 mEq/L — ABNORMAL LOW (ref 135–145)

## 2011-02-03 LAB — URINE MICROSCOPIC-ADD ON

## 2011-02-08 NOTE — Progress Notes (Signed)
  NAME:  JAHLANI, LORENTZ NO.:  [id number]  MEDICAL RECORD NO.:  [id number]           PATIENT TYPE:  I  LOCATION:  A210                          FACILITY:  [facility]  PHYSICIAN:  Lavel Rieman L. Juanetta Gosling, M.D.DATE OF BIRTH:  [birth year]  DATE OF PROCEDURE: DATE OF DISCHARGE:                                PROGRESS NOTE   Mr. Starlin was admitted with DKA.  He is doing better, but still has some abnormalities in his blood sugar.  He has been moved from the Intensive Care Unit, where he was initially.  He has overall improved.  His exam shows that he is awake and alert.  Chest is fairly clear. Heart is regular.  Blood sugar in the 100s-200s mostly.  His abdomen is soft.  ASSESSMENT:  He has DKA.  He is improving and he seems to be getting better in general.  Dr. Fransico Him has seen him and we are hopeful that he will be able to be ready for discharge tomorrow.  I do not plan to change any of his treatments today.     Delayla Hoffmaster L. Juanetta Gosling, M.D.     ELH/MEDQ  D:  01/30/2011  T:  01/31/2011  Job:  [id number]  Electronically Signed by Kari Baars M.D. on 02/08/2011 09:00:01 AM

## 2011-02-08 NOTE — Progress Notes (Signed)
  NAME:  OAKES, MCCREADY NO.:  [id number]  MEDICAL RECORD NO.:  [id number]           PATIENT TYPE:  I  LOCATION:  A210                          FACILITY:  [facility]  PHYSICIAN:  Kaari Zeigler L. Juanetta Gosling, M.D.DATE OF BIRTH:  05/28/1981  DATE OF PROCEDURE: DATE OF DISCHARGE:  01/31/2011                                PROGRESS NOTE   Tyler Mann says he is doing okay.  He did have a somewhat low blood sugar during last night, but he says he thinks that if he was at home, he would have had a larger bedtime snack and then he can control that. Otherwise, everything is about the same.  He has no new complaints.  PHYSICAL EXAMINATION:  VITAL SIGNS:  His exam shows his temperature is 97.9, pulse 82, respirations 20, blood pressure 129/91.  His O2 sats is 96% on room air GENERAL:  He is awake and alert, sitting up.  Says he feels well.  LABORATORY WORK:  His BUN is 6, creatinine 0.71, glucose 194.  White blood count 7900, hemoglobin is 11.5, platelets 307.  ASSESSMENT:  I think overall he is doing better and my plan is to discharge him home today.  Please see discharge summary for details.  I will ask for Dr. Isidoro Donning help with planning his insulin regimen at home.     Yulieth Carrender L. Juanetta Gosling, M.D.     ELH/MEDQ  D:  01/31/2011  T:  01/31/2011  Job:  [id number]  Electronically Signed by Kari Baars M.D. on 02/08/2011 09:00:07 AM

## 2011-02-08 NOTE — Discharge Summary (Signed)
  NAME:  JES, COSTALES NO.:  [id number]  MEDICAL RECORD NO.:  [id number]           PATIENT TYPE:  I  LOCATION:  A210                          FACILITY:  [facility]  PHYSICIAN:  Adedamola Seto L. Juanetta Gosling, M.D.DATE OF BIRTH:  17-Nov-1981  DATE OF ADMISSION:  01/27/2011 DATE OF DISCHARGE:  03/14/2012LH                              DISCHARGE SUMMARY   FINAL DISCHARGE DIAGNOSES: 1. Diabetic ketoacidosis. 2. Type 1 diabetes, chronically uncontrolled. 3. Chronic pain. 4. History of polysubstance abuse.  HISTORY:  Mr. Grays is a 30 year old who has had diabetes type 1 since age 62.  He had been in the hospital in February with DKA, was discharged.  Had seen Dr. Fransico Him, the endocrinologist in the office, thinks seemed to be going well and then his blood sugar became uncontrolled again.  There did not seem to be a cause of that, no specific infection, etc.  He simply became uncontrolled.  He was brought into the hospital, started on intravenous insulin and improved rapidly. He was switched back to Levemir and NovoLog.  His blood sugars stabilized and he is discharged home to continue with: 1. Ibuprofen 200 mg 2 tablets every 6 hours p.r.n. pain. 2. Hydrocodone 10/325 q.i.d. p.r.n. more severe pain. 3. NovoLog on a sliding scale. 4. NovoLog 10 units with meals. 5. Levemir 24 units at bedtime. 6. Ambien 10 mg at bedtime. 7. Gabapentin 300 mg 4 times a day.  I have asked Dr. Fransico Him to approve the final dosing on the insulin and make sure the dose that he would like to see Mr. Berger on, and otherwise I will see him back in my office in about 2 weeks and have him also follow with Dr. Fransico Him.     Chanelle Hodsdon L. Juanetta Gosling, M.D.     ELH/MEDQ  D:  01/31/2011  T:  01/31/2011  Job:  [id number]  Electronically Signed by Kari Baars M.D. on 02/08/2011 08:59:59 AM

## 2011-03-05 NOTE — Consult Note (Signed)
NAME:  Tyler Mann, Tyler Mann NO.:  [id number]  MEDICAL RECORD NO.:  [id number]           PATIENT TYPE:  LOCATION:                                 FACILITY:  PHYSICIAN:  Serafina Topham, MD DATE OF BIRTH:  04-20-1981  DATE OF CONSULTATION: DATE OF DISCHARGE:                                CONSULTATION   REASON FOR CONSULT:  Uncontrolled type 2 diabetes.  HISTORY OF PRESENT ILLNESS:  This is a 30 year old Caucasian gentleman with medical history significant for type 1 diabetes since age 83 with chronically uncontrolled status, currently inhouse with a complaint of severe hyperglycemia, not in diabetic ketoacidosis.  He was put on insulin infusion which had controlled his glycemia and currently on Lantus and NovoLog basal and bolus insulin.  He states that he came to the ER with complaint of pain on his bilateral legs because he ran out of his pain medications which include high-dose Opana.  He says his lower extremity pain resulted from diabetes complication likely neuropathy.  He gave a history of prior hospitalization for diabetic ketoacidosis, but none in the last 8 months.  He uses NPH and NovoLog insulin on sliding scale basis and not on fixed standing order.  PAST MEDICAL HISTORY:  Type 1 diabetes and drug abuse including marijuana, cocaine, and opiates.  He denies any IV drug use.  HOME MEDICATIONS:  NPH and NovoLog insulin at various doses.  ALLERGIES:  None.  SOCIAL HISTORY:  Remarkable for multiple drug abuse.  FAMILY HISTORY:  Multiple sclerosis in his mother.  No significant diabetes history.  REVIEW OF SYSTEMS:  At this point, the patient denies any chest pain, shortness of breath.  No fever and no chills.  He is tolerating oral feeding, but complains of pain which is chronic on his lower extremities.  PHYSICAL EXAMINATION:  GENERAL:  He is alert and oriented x3, not in acute distress.  He was sleeping, but arousable. VITAL SIGNS:  Within  normal limits. HEENT:  Slightly dry mucous membranes, otherwise anicteric.  No pallor. NECK:  Negative for thyromegaly. CHEST:  Clear to auscultation bilaterally. CARDIOVASCULAR:  Normal S1 and S2.  No murmur, no gallop. ABDOMEN:  Soft and nontender. EXTREMITIES:  No edema. CNS:  Nonfocal. SKIN:  No rash.  No hyperemia.  His most recent lab shows sodium 136, potassium 3.4, chloride 100, bicarb 28, BUN 7, creatinine 0.75, AST and ALT normal, albumin 3.3.  WBC 12.9, hemoglobin 12.0, hematocrit 37.4, platelets 303.  A1c none found. Thyroid function test none found at least during this admission.  ASSESSMENT AND PLAN:  Type 1 diabetes chronically uncontrolled.  The patient presented with severe hyperglycemia with impending DKA currently status post IV insulin therapy which will control glycemia to near normal.  I have counseled the patient for approximately 25 minutes regarding the tighten control of diabetes to avoid acute-on-chronic diabetes complication.  I have readjusted his medications as follows. He says he cannot afford Lantus, Levemir, NovoLog, and Humalog and would like to stay on his NPH and regular insulin.  Hence, I have readjusted his basal insulin as NPH 12 units b.i.d. and his  bolus insulin as regular insulin 80 units prior to each 3 major meals plus low dose sliding scale.  I have requested A1c and thyroid function test to be done before he leaves the hospital.  I offered him outpatient followup in 1 week to titrate his insulin.  If he stays overnight in the hospital, I will see him in morning to make further adjustments if needed.  I have discussed in detail with the patient the fact that type 1 diabetes would require standing order of basal and bolus insulin to avoid DKA and frequent hospitalization.  Dear Dr. Juanetta Gosling, thank you for the opportunity to participate in the care of this pleasant patient.  I will update you in his progress.           ______________________________ Purcell Nails, MD     GN/MEDQ  D:  01/08/2011  T:  01/09/2011  Job:  [id number]  Electronically Signed by Purcell Nails MD on 03/05/2011 09:03:00 AM

## 2011-03-05 NOTE — Consult Note (Signed)
NAME:  Tyler Mann, Tyler Mann NO.:  [id number]  MEDICAL RECORD NO.:  [id number]           PATIENT TYPE:  I  LOCATION:  A210                          FACILITY:  [facility]  PHYSICIAN:  Purcell Nails, MD DATE OF BIRTH:  Jul 21, 1981  DATE OF CONSULTATION:  01/27/2011 DATE OF DISCHARGE:                                CONSULTATION   REASON FOR CONSULTATION:  Uncontrolled type 1 diabetes.  HISTORY OF PRESENT ILLNESS:  The patient is a 30 year old Caucasian gentleman with a medical history significant for type 1 diabetes since age 60 with chronically uncontrolled status with most recent A1c of 9.6% and frequent hospitalization with diabetes ketoacidosis and hyperglycemia.  He was last admitted on January 07, 2011, and I saw him on January 08, 2011, for the same consultation.  At this time, he is being admitted after sustaining hyperglycemia for protracted duration of time and takes a himself to the ER where he was found to have severe acid-base imbalance consistent with diabetic ketoacidosis.  I had a chance to seen him as an outpatient where I have changed his NPH and regular insulin to Levemir and Humalog with clear instruction how to use these insulin, however, the patient did not report any extremes of glycemia before he arrived at the ER.  He was supposed to be on Levemir 20 units and Humalog 8 units t.i.d. a.c. plus sliding scale for his diabetes.  PAST MEDICAL HISTORY:  Type 1 diabetes, multiple substance abuse including marijuana, cocaine, and opiates.  MEDICATIONS:  Home medications include Levemir 20 units nightly and Humalog 8 units t.i.d. a.c. plus sliding scale.  ALLERGIES:  None.  SOCIAL HISTORY:  Remarkable for multiple drug abuse.  FAMILY HISTORY:  Significant for multiple sclerosis in his mother.  No significant history of diabetes.  REVIEW OF SYSTEMS:  At this point, the patient is status post IV insulin therapy, on basal bolus insulin with  controlled glycemia and good appetite.  No nausea.  No vomiting.  The rest of the systems have been reviewed and negative.  PHYSICAL EXAMINATION:  GENERAL:  He is alert and oriented x3 not in acute distress.  He is eating his breakfast. VITAL SIGNS:  BP 126/79, pulse rate 83, temperature 97.4. HEENT:  Moist mucous membranes.  There were no signs of dehydration. NECK:  Negative for JVD or thyromegaly. CHEST:  Clear to auscultation bilaterally. CARDIOVASCULAR:  Normal.  S1 and S2.  No murmur.  No gallop. ABDOMEN:  Soft and nontender. EXTREMITIES:  No edema. CNS:  Nonfocal. SKIN:  No rash.  No hyperemia.  LABORATORY DATA:  His glycemia in the 24 hours ranged from 59 to 217. Mild hypoglycemia was a result of transitioning NPH used as a one-time dose.  His chemistry shows sodium 138, potassium 4.1, chloride 102, bicarb 26, BUN 7, creatinine 0.7.  The CBC shows WBC 17.4, hemoglobin 13.5, hematocrit 41.7, platelet count 386.  ASSESSMENT AND PLAN: 1. Diabetic ketoacidosis, resolved. 2. Type 1 diabetes chronically uncontrolled with a recent hemoglobin     A1c of 9.6%.  The patient is currently status post treatment with     IV  insulin for diabetic ketoacidosis.  Since yesterday, he was     switched to Lantus 20 units nightly and NovoLog 8 units t.i.d. a.c.     plus low-dose sliding scale.  Current glycemic profile is     acceptable to proceed with the same insulin regimen for the next 24     hours.  I have repeatedly counselled this patient for 25 minutes     regarding the need for tightening of diabetes control to avoid     acute and chronic complications.  This patient has several social problems including lack of medical insurance as well as polysubstance abuse which basically interferes with his ability to focus on his diabetic care.  I offered him outpatient followup during last encounter, and he already has an appointment to see me in the outpatient setting.  I have reiterated with  the patient the fact that type 1 diabetes would require type least standing basal insulin regardless of meal intake to prevent diabetic ketoacidosis and proper coverage of his meals with rapid acting insulin is also advised.  Dear Dr. Juanetta Gosling, thank you for the opportunity to participate in the care of this pleasant patient.  I will update you his progress.          ______________________________ Purcell Nails, MD     GN/MEDQ  D:  01/30/2011  T:  01/31/2011  Job:  [id number]  Electronically Signed by Purcell Nails MD on 03/05/2011 09:03:05 AM

## 2011-04-03 NOTE — H&P (Signed)
NAME:  Tyler Mann, CERNEY NO.:  [id number]   MEDICAL RECORD NO.:  [id number]          PATIENT TYPE:  INP   LOCATION:  A225                          FACILITY:  [facility]   PHYSICIAN:  Edward L. Juanetta Gosling, M.D.DATE OF BIRTH:  June 09, 1981   DATE OF ADMISSION:  03/16/2008  DATE OF DISCHARGE:  LH                              HISTORY & PHYSICAL   Mr. Maffeo is a 30 year old who has had a long known history of  diabetes which was juvenile onset.  He has seen multiple  endocrinologists, and to my knowledge, he has never had good control of  his blood sugar.  He says that he was at a party about 3 days prior to  admission, and he developed nausea and vomiting following that.  He told  the emergency room doctor that he ran out of his Lantus. He tells me  that he ran out of his vial version of Humalog. He has been using a Pen  with does not seem to have worked.  He had been nauseated.  He has been  vomiting, but he is better in general this morning.  He has been  unemployed.  He does not have any insurance, so he has had difficulty  with obtaining his medications and has not done well with medical  followup.   PAST MEDICAL HISTORY:  Positive for diabetes as mentioned. He is  supposed to be taking Lantus and a sliding scale of Humalog, and it is  not really quite clear what he is taking.   FAMILY HISTORY:  Positive for multiple sclerosis.   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  He does smoke cigarettes at least a pack a day. Does not drink any  alcohol.  No use of illicit drugs.  He has been staying with friends   PHYSICAL EXAMINATION:  GENERAL:  A thin male who is in no acute  distress.  He is awake and alert.  His he does not appear to be having  any Kussmaul breathing.  VITAL SIGNS:  His blood pressure was 110/70, pulse 90.  He is afebrile  now.  HEENT:  His pupils are reactive.  His mucous membranes are moist.  Dental hygiene fair.  NECK:   Supple without masses.  CHEST:  Relatively clear without wheezes, rales or rhonchi.  HEART:  Regular without murmur, gallop or rub.  ABDOMEN:  Soft.  No masses felt.  EXTREMITIES:  Showed no edema.  CENTRAL NERVOUS SYSTEM:  Examination is grossly intact.   LABORATORY DATA:  Urine drug screen positive for benzodiazepines,  otherwise negative.  BMET showed a CO2 of 18, BUN of 17, creatinine 1.3.  Blood gas showed pH 7.32, pCO2 of 28, pO2 of 110.   ASSESSMENT:  He has mild diabetic ketoacidosis.   PLAN:  Admit him for insulin drip, etc..  Will see if we can somehow  obtain some help with diabetes teaching, etc.      Oneal Deputy. Juanetta Gosling, M.D.  Electronically Signed     ELH/MEDQ  D:  03/17/2008  T:  03/17/2008  Job:  [id number] 

## 2011-04-03 NOTE — Group Therapy Note (Signed)
NAME:  Tyler Mann, Tyler Mann NO.:  [id number]   MEDICAL RECORD NO.:  [id number]          PATIENT TYPE:  INP   LOCATION:  A225                          FACILITY:  [facility]   PHYSICIAN:  Edward L. Juanetta Gosling, M.D.DATE OF BIRTH:  [birth year]   DATE OF PROCEDURE:  03/17/2008  DATE OF DISCHARGE:  03/17/2008                                 PROGRESS NOTE   Mr. Cooler is much better.  He states he feels better and wants to go  home.  Part of his reason for wanting to be discharged of course is  financial in nature, since he does not have medical insurance, but he is  much better.  His blood sugars are better, and I think it is okay for  him to be discharged.  He has got medications available now.  I will  plan to see him back in my office.      Edward L. Juanetta Gosling, M.D.  Electronically Signed     ELH/MEDQ  D:  03/17/2008  T:  03/18/2008  Job:  [id number]

## 2011-04-03 NOTE — Group Therapy Note (Signed)
NAME:  STEFEN, JUBA NO.:  [id number]   MEDICAL RECORD NO.:  [id number]          PATIENT TYPE:  INP   LOCATION:  A225                          FACILITY:  [facility]   PHYSICIAN:  Edward L. Juanetta Gosling, M.D.DATE OF BIRTH:  November 29, 1980   DATE OF PROCEDURE:  03/17/2008  DATE OF DISCHARGE:                                 PROGRESS NOTE   Mr. Chevez was admitted last night with uncontrolled diabetes.  He  says he is better this morning, but he is still having some ups and  downs with his blood sugar.  Please see history and physical for more  details.      Edward L. Juanetta Gosling, M.D.  Electronically Signed     ELH/MEDQ  D:  03/17/2008  T:  03/17/2008  Job:  [id number]

## 2011-04-06 NOTE — Discharge Summary (Signed)
NAME:  Tyler Mann, Tyler Mann NO.:  [id number]   MEDICAL RECORD NO.:  [id number]           PATIENT TYPE:   LOCATION:                                 FACILITY:   PHYSICIAN:  Edward L. Juanetta Gosling, M.D.DATE OF BIRTH:  1981-10-06   DATE OF ADMISSION:  DATE OF DISCHARGE:  LH                               DISCHARGE SUMMARY   FINAL DISCHARGE DIAGNOSES:  1. Diabetic ketoacidosis.  2. Juvenile-onset diabetes.  3. Anxiety.   HISTORY:  Tyler Mann is a 30 year old _man_________ with a well-known  history of diabetes, which was juvenile onset, and in the 5 years or so  that I have known him, has never been controlled.  He has been in my  office, and I have sent him to multiple endocrinologists with very  little help.  His situation is now complicated by the fact that he is  not employed.  He has no health insurance and has had more problems with  getting his medications.  He told the doctor in the emergency room when  he came to the emergency room that he had run out of Lantus, but he told  me that he had run out of Humalog, and he likes to take the Humalog in  the vial.  When he switched to the pen, he said it did not  seem to  work.  He was actually taking someone else's pen insulin.   PHYSICAL EXAMINATION:  GENERAL:  On admission showed a thin male in no  acute distress.  He does not have any Kussmaul breathing.  CHEST:  Clear.  HEART:  Regular.  ABDOMEN:  Soft.   His BMET showed a CO2 of 18, a BUN of 17, a creatinine of 1.3.  His  blood gas showed a pH of 7.32, pCO2 of 28, pO2 of 110.   HOSPITAL COURSE:  He was started on an insulin drip, and he showed  marked improvement over the next 24 hours to the point that he was able  to be discharged home.  He is going to restart Lantus insulin that he  has been taking at about 24 units a day, and he is going to be on the  sliding scale that we use in the hospital, because he said he thought  that worked better than his sliding  scale that he was previously using  at home.  I plan to see him back in my office in about a month.      Edward L. Juanetta Gosling, M.D.  Electronically Signed     ELH/MEDQ  D:  03/22/2008  T:  03/23/2008  Job:  [id number]

## 2011-04-28 ENCOUNTER — Emergency Department (HOSPITAL_COMMUNITY)
Admission: EM | Admit: 2011-04-28 | Discharge: 2011-04-28 | Disposition: A | Discharge status: Home / Self Care | Payer: Self-pay | Attending: Emergency Medicine | Admitting: Emergency Medicine

## 2011-04-28 DIAGNOSIS — Z794 Long term (current) use of insulin: Secondary | ICD-10-CM | POA: Insufficient documentation

## 2011-04-28 DIAGNOSIS — F329 Major depressive disorder, single episode, unspecified: Secondary | ICD-10-CM | POA: Insufficient documentation

## 2011-04-28 DIAGNOSIS — F3289 Other specified depressive episodes: Secondary | ICD-10-CM | POA: Insufficient documentation

## 2011-04-28 DIAGNOSIS — J40 Bronchitis, not specified as acute or chronic: Secondary | ICD-10-CM | POA: Insufficient documentation

## 2011-04-28 DIAGNOSIS — E1069 Type 1 diabetes mellitus with other specified complication: Secondary | ICD-10-CM | POA: Insufficient documentation

## 2011-04-28 LAB — GLUCOSE, CAPILLARY: Glucose-Capillary: 121 mg/dL — ABNORMAL HIGH (ref 70–99)

## 2011-05-13 ENCOUNTER — Emergency Department (HOSPITAL_COMMUNITY)
Admission: EM | Admit: 2011-05-13 | Discharge: 2011-05-14 | Disposition: A | Discharge status: Home / Self Care | Payer: Self-pay | Attending: Emergency Medicine | Admitting: Emergency Medicine

## 2011-05-13 DIAGNOSIS — F17200 Nicotine dependence, unspecified, uncomplicated: Secondary | ICD-10-CM | POA: Insufficient documentation

## 2011-05-13 DIAGNOSIS — F3289 Other specified depressive episodes: Secondary | ICD-10-CM | POA: Insufficient documentation

## 2011-05-13 DIAGNOSIS — F1910 Other psychoactive substance abuse, uncomplicated: Secondary | ICD-10-CM | POA: Insufficient documentation

## 2011-05-13 DIAGNOSIS — E119 Type 2 diabetes mellitus without complications: Secondary | ICD-10-CM | POA: Insufficient documentation

## 2011-05-13 DIAGNOSIS — Z79899 Other long term (current) drug therapy: Secondary | ICD-10-CM | POA: Insufficient documentation

## 2011-05-13 DIAGNOSIS — F329 Major depressive disorder, single episode, unspecified: Secondary | ICD-10-CM | POA: Insufficient documentation

## 2011-05-14 LAB — DIFFERENTIAL
Basophils Absolute: 0.1 10*3/uL (ref 0.0–0.1)
Basophils Relative: 1 % (ref 0–1)
Eosinophils Absolute: 0.2 10*3/uL (ref 0.0–0.7)
Eosinophils Relative: 2 % (ref 0–5)
Monocytes Absolute: 0.6 10*3/uL (ref 0.1–1.0)
Monocytes Relative: 5 % (ref 3–12)

## 2011-05-14 LAB — BASIC METABOLIC PANEL
Calcium: 9.5 mg/dL (ref 8.4–10.5)
GFR calc Af Amer: >60 mL/min (ref >60)
GFR calc non Af Amer: >60 mL/min (ref >60)
Glucose, Bld: 253 mg/dL — ABNORMAL HIGH (ref 70–99)
Potassium: 3.4 mEq/L — ABNORMAL LOW (ref 3.5–5.1)
Sodium: 136 mEq/L (ref 135–145)

## 2011-05-14 LAB — GLUCOSE, CAPILLARY: Glucose-Capillary: 573 mg/dL (ref 70–99)

## 2011-05-14 LAB — CBC
MCH: 27.6 pg (ref 26.0–34.0)
MCHC: 32.9 g/dL (ref 30.0–36.0)
RDW: 14.2 % (ref 11.5–15.5)

## 2011-05-27 ENCOUNTER — Emergency Department (HOSPITAL_COMMUNITY)
Admission: EM | Admit: 2011-05-27 | Discharge: 2011-05-28 | Disposition: A | Discharge status: Home / Self Care | Payer: Self-pay | Attending: Emergency Medicine | Admitting: Emergency Medicine

## 2011-05-27 ENCOUNTER — Encounter: Payer: Self-pay | Admitting: *Deleted

## 2011-05-27 DIAGNOSIS — E119 Type 2 diabetes mellitus without complications: Secondary | ICD-10-CM | POA: Insufficient documentation

## 2011-05-27 DIAGNOSIS — F17200 Nicotine dependence, unspecified, uncomplicated: Secondary | ICD-10-CM | POA: Insufficient documentation

## 2011-05-27 DIAGNOSIS — G629 Polyneuropathy, unspecified: Secondary | ICD-10-CM

## 2011-05-27 DIAGNOSIS — R110 Nausea: Secondary | ICD-10-CM | POA: Insufficient documentation

## 2011-05-27 DIAGNOSIS — R739 Hyperglycemia, unspecified: Secondary | ICD-10-CM

## 2011-05-27 DIAGNOSIS — M79609 Pain in unspecified limb: Secondary | ICD-10-CM | POA: Insufficient documentation

## 2011-05-27 HISTORY — DX: Polyneuropathy, unspecified: G62.9

## 2011-05-27 LAB — GLUCOSE, CAPILLARY: Glucose-Capillary: >600 mg/dL (ref 70–99)

## 2011-05-27 NOTE — ED Notes (Signed)
Patient states that his blood sugar has been "out of whack" for several days, states that he does not feel good-nausea, arms and legs numb

## 2011-05-28 ENCOUNTER — Encounter (HOSPITAL_COMMUNITY): Payer: Self-pay | Admitting: *Deleted

## 2011-05-28 LAB — BASIC METABOLIC PANEL
BUN: 17 mg/dL (ref 6–23)
Calcium: 9.1 mg/dL (ref 8.4–10.5)
Chloride: 92 mEq/L — ABNORMAL LOW (ref 96–112)
Creatinine, Ser: 1.04 mg/dL (ref 0.50–1.35)
GFR calc Af Amer: >60 mL/min (ref >60)
GFR calc non Af Amer: >60 mL/min (ref >60)
Potassium: 4.5 mEq/L (ref 3.5–5.1)

## 2011-05-28 LAB — URINALYSIS, ROUTINE W REFLEX MICROSCOPIC
Glucose, UA: >1000 mg/dL — AB
Hgb urine dipstick: NEGATIVE
Leukocytes, UA: NEGATIVE
Nitrite: NEGATIVE
Specific Gravity, Urine: <1.005 — ABNORMAL LOW (ref 1.005–1.030)
Urobilinogen, UA: 0.2 mg/dL (ref 0.0–1.0)
pH: 6 (ref 5.0–8.0)

## 2011-05-28 LAB — DIFFERENTIAL
Basophils Absolute: 0.1 10*3/uL (ref 0.0–0.1)
Basophils Relative: 1 % (ref 0–1)
Lymphocytes Relative: 25 % (ref 12–46)
Monocytes Absolute: 0.4 10*3/uL (ref 0.1–1.0)
Neutro Abs: 6.8 10*3/uL (ref 1.7–7.7)
Neutrophils Relative %: 70 % (ref 43–77)

## 2011-05-28 LAB — CBC
HCT: 38 % — ABNORMAL LOW (ref 39.0–52.0)
Hemoglobin: 11.9 g/dL — ABNORMAL LOW (ref 13.0–17.0)
MCHC: 31.3 g/dL (ref 30.0–36.0)
RDW: 14.6 % (ref 11.5–15.5)
WBC: 9.7 10*3/uL (ref 4.0–10.5)

## 2011-05-28 LAB — URINE MICROSCOPIC-ADD ON

## 2011-05-28 MED ORDER — SODIUM CHLORIDE 0.9 % IV SOLN
Freq: Once | INTRAVENOUS | Status: AC
  Administered 2011-05-28: 1000 mL via INTRAVENOUS
  Administered 2011-05-28: 02:00:00 via INTRAVENOUS

## 2011-05-28 MED ORDER — ONDANSETRON HCL 4 MG/2ML IJ SOLN
4.0000 mg | Freq: Once | INTRAMUSCULAR | Status: AC
  Administered 2011-05-28: 4 mg via INTRAVENOUS

## 2011-05-28 MED ORDER — INSULIN ASPART 100 UNIT/ML ~~LOC~~ SOLN
10.0000 [IU] | Freq: Once | SUBCUTANEOUS | Status: AC
  Administered 2011-05-28: 10 [IU] via INTRAVENOUS
  Filled 2011-05-28: qty 3

## 2011-05-28 MED ORDER — INSULIN ASPART 100 UNIT/ML ~~LOC~~ SOLN
10.0000 [IU] | Freq: Once | SUBCUTANEOUS | Status: DC
  Filled 2011-05-28: qty 0.1

## 2011-05-28 MED ORDER — HYDROMORPHONE HCL 1 MG/ML IJ SOLN
1.0000 mg | Freq: Once | INTRAMUSCULAR | Status: AC
  Administered 2011-05-28: 1 mg via INTRAVENOUS
  Filled 2011-05-28: qty 1

## 2011-05-28 MED ORDER — ONDANSETRON HCL 4 MG/2ML IJ SOLN
4.0000 mg | Freq: Once | INTRAMUSCULAR | Status: DC
  Filled 2011-05-28: qty 2

## 2011-05-28 MED ORDER — SODIUM CHLORIDE 0.9 % IV SOLN
Freq: Once | INTRAVENOUS | Status: AC
  Administered 2011-05-28: 1000 mL via INTRAVENOUS

## 2011-05-28 NOTE — ED Provider Notes (Addendum)
History     Chief Complaint  Patient presents with  . Hyperglycemia   HPI Comments: PATIENT KNOWN TO ME AND FREQUENTLY COMES FOR HIGH BLOOD SUGAR AND BLE PAIN. FOR THE PAST 3 DAYS BS HAS BEEN GREATER THAN 600. NO VOMITING NO FEVER, NO ABD PAIN, SOME NAUSEA. NO CHANGE IN INSULIN. LE PAIN 8/10 AND OFTEN RELATED TO HIGH BS.   The history is provided by the patient.    Past Medical History  Diagnosis Date  . Diabetes mellitus   . Neuropathy     History reviewed. No pertinent past surgical history.  History reviewed. No pertinent family history.  History  Substance Use Topics  . Smoking status: Current Everyday Smoker -- 0.5 packs/day for 17 years    Types: Cigarettes  . Smokeless tobacco: Not on file  . Alcohol Use: No      Review of Systems  Constitutional: Negative for fever and appetite change.  HENT: Negative for neck pain.   Eyes: Negative for visual disturbance.  Respiratory: Negative for cough, chest tightness and shortness of breath.   Cardiovascular: Negative for chest pain and leg swelling.  Gastrointestinal: Positive for nausea. Negative for vomiting, abdominal pain and diarrhea.  Genitourinary: Positive for frequency.  Musculoskeletal: Negative for back pain and joint swelling.  Skin: Negative for rash.  Neurological: Negative for weakness and headaches.  Hematological: Negative for adenopathy.  Psychiatric/Behavioral: Negative for confusion.    Physical Exam  BP 126/80  Pulse 92  Temp(Src) 98.2 F (36.8 C) (Oral)  Resp 18  Ht 6' (1.829 m)  Wt 174 lb (78.926 kg)  BMI 23.60 kg/m2  SpO2 98%  Physical Exam  Constitutional: He is oriented to person, place, and time. He appears well-developed and well-nourished.  HENT:  Head: Normocephalic and atraumatic.  Eyes: Conjunctivae and EOM are normal. Pupils are equal, round, and reactive to light.  Neck: Normal range of motion. Neck supple.  Cardiovascular: Normal rate, regular rhythm, normal heart sounds  and intact distal pulses.   Pulmonary/Chest: Effort normal and breath sounds normal.  Abdominal: Soft. Bowel sounds are normal. There is no tenderness.  Musculoskeletal: Normal range of motion. He exhibits no edema and no tenderness.  Neurological: He is alert and oriented to person, place, and time. No cranial nerve deficit.       NORMAL MOTOR AND SENSATION  Skin: No rash noted.    ED Course  Procedures  MDM LABS NOTED LOW NA PSEUDO HYPONATREMIA FOR GLUCOSE IN 700'S. IN ED RECEIVED IV NS BOLUS X2 AND IV INSULIN 10 UNITS. BS IMPROVED TO 360'S AND LEG PAIN SIG IMPROVED WITH IV DILAUDID. NO ACIDOSIS.   CRITICAL CARE TIME 30 MINUTES FOR RESUSCITAITON STUDY INTERPRETATION AND RECHECKING ON PATIENT.       Shelda Jakes, MD 05/28/11 1610  Shelda Jakes, MD 05/28/11 [phone]

## 2011-08-14 LAB — BLOOD GAS, ARTERIAL
Acid-base deficit: 10.5 — ABNORMAL HIGH
O2 Content: 21
O2 Saturation: 97.2
Patient temperature: 37
TCO2: 13

## 2011-08-14 LAB — CBC
HCT: 41.5
MCV: 87.4
RBC: 4.75
WBC: 16.1 — ABNORMAL HIGH

## 2011-08-14 LAB — BASIC METABOLIC PANEL
BUN: 17
CO2: 18 — ABNORMAL LOW
CO2: 18 — ABNORMAL LOW
Calcium: 9.1
Calcium: 9.6
Chloride: 90 — ABNORMAL LOW
Chloride: 96
Creatinine, Ser: 1.3
Creatinine, Ser: 1.33
GFR calc Af Amer: >60
Glucose, Bld: 347 — ABNORMAL HIGH
Sodium: 126 — ABNORMAL LOW

## 2011-08-14 LAB — HEPATITIS PANEL, ACUTE
HCV Ab: NEGATIVE
Hep A IgM: NEGATIVE
Hep B C IgM: NEGATIVE

## 2011-08-14 LAB — DIFFERENTIAL
Basophils Absolute: 0
Eosinophils Relative: 1
Lymphocytes Relative: 18
Lymphs Abs: 2.9
Monocytes Relative: 2 — ABNORMAL LOW

## 2011-08-14 LAB — URINALYSIS, ROUTINE W REFLEX MICROSCOPIC
Bilirubin Urine: NEGATIVE
Glucose, UA: >1000 — AB
Hgb urine dipstick: NEGATIVE
Ketones, ur: >80 — AB
Specific Gravity, Urine: 1.01
pH: 5

## 2011-08-14 LAB — RAPID URINE DRUG SCREEN, HOSP PERFORMED
Amphetamines: NOT DETECTED
Opiates: NOT DETECTED
Tetrahydrocannabinol: NOT DETECTED

## 2011-08-14 LAB — URINE MICROSCOPIC-ADD ON

## 2011-08-14 LAB — URINE CULTURE: Colony Count: 1000

## 2011-08-14 LAB — KETONES, QUALITATIVE

## 2011-08-15 LAB — DIFFERENTIAL
Basophils Absolute: 0
Basophils Relative: 1
Eosinophils Relative: 2
Monocytes Absolute: 0.5
Neutro Abs: 5.9

## 2011-08-15 LAB — BASIC METABOLIC PANEL
BUN: 15
CO2: 27
Calcium: 9.3
Glucose, Bld: 86
Sodium: 139

## 2011-08-15 LAB — CBC
HCT: 34.9 — ABNORMAL LOW
Hemoglobin: 12.1 — ABNORMAL LOW
MCHC: 34.6
Platelets: 304
RDW: 15.2

## 2012-02-21 ENCOUNTER — Ambulatory Visit (HOSPITAL_COMMUNITY)
Admission: RE | Admit: 2012-02-21 | Discharge: 2012-02-21 | Disposition: A | Discharge status: Home / Self Care | Payer: Self-pay | Source: Ambulatory Visit | Attending: Pulmonary Disease | Admitting: Pulmonary Disease

## 2012-02-21 ENCOUNTER — Other Ambulatory Visit (HOSPITAL_COMMUNITY): Payer: Self-pay | Admitting: Pulmonary Disease

## 2012-02-21 DIAGNOSIS — R52 Pain, unspecified: Secondary | ICD-10-CM

## 2012-09-18 ENCOUNTER — Encounter (HOSPITAL_COMMUNITY): Payer: Self-pay | Admitting: *Deleted

## 2012-09-18 ENCOUNTER — Inpatient Hospital Stay (HOSPITAL_COMMUNITY)
Admission: EM | Admit: 2012-09-18 | Discharge: 2012-09-21 | DRG: 639 | Disposition: A | Discharge status: Home / Self Care | Payer: Self-pay | Attending: Family Medicine | Admitting: Family Medicine

## 2012-09-18 DIAGNOSIS — Z79899 Other long term (current) drug therapy: Secondary | ICD-10-CM

## 2012-09-18 DIAGNOSIS — Z794 Long term (current) use of insulin: Secondary | ICD-10-CM

## 2012-09-18 DIAGNOSIS — Z23 Encounter for immunization: Secondary | ICD-10-CM

## 2012-09-18 DIAGNOSIS — E785 Hyperlipidemia, unspecified: Secondary | ICD-10-CM | POA: Diagnosis present

## 2012-09-18 DIAGNOSIS — F17200 Nicotine dependence, unspecified, uncomplicated: Secondary | ICD-10-CM | POA: Diagnosis present

## 2012-09-18 DIAGNOSIS — E1110 Type 2 diabetes mellitus with ketoacidosis without coma: Secondary | ICD-10-CM | POA: Diagnosis present

## 2012-09-18 DIAGNOSIS — E1010 Type 1 diabetes mellitus with ketoacidosis without coma: Principal | ICD-10-CM | POA: Diagnosis present

## 2012-09-18 DIAGNOSIS — F1910 Other psychoactive substance abuse, uncomplicated: Secondary | ICD-10-CM | POA: Diagnosis present

## 2012-09-18 DIAGNOSIS — G8929 Other chronic pain: Secondary | ICD-10-CM | POA: Diagnosis present

## 2012-09-18 DIAGNOSIS — G589 Mononeuropathy, unspecified: Secondary | ICD-10-CM | POA: Diagnosis present

## 2012-09-18 DIAGNOSIS — E109 Type 1 diabetes mellitus without complications: Secondary | ICD-10-CM | POA: Diagnosis present

## 2012-09-18 LAB — GLUCOSE, CAPILLARY
Glucose-Capillary: 344 mg/dL — ABNORMAL HIGH (ref 70–99)
Glucose-Capillary: 328 mg/dL — ABNORMAL HIGH (ref 70–99)

## 2012-09-18 LAB — BASIC METABOLIC PANEL
BUN: 24 mg/dL — ABNORMAL HIGH (ref 6–23)
CO2: 13 mEq/L — ABNORMAL LOW (ref 19–32)
Calcium: 8.4 mg/dL (ref 8.4–10.5)
Chloride: 92 mEq/L — ABNORMAL LOW (ref 96–112)
Creatinine, Ser: 0.82 mg/dL (ref 0.50–1.35)
Glucose, Bld: 424 mg/dL — ABNORMAL HIGH (ref 70–99)

## 2012-09-18 LAB — URINALYSIS, ROUTINE W REFLEX MICROSCOPIC
Glucose, UA: 500 mg/dL — AB
Ketones, ur: >80 mg/dL — AB
Leukocytes, UA: NEGATIVE
Protein, ur: NEGATIVE mg/dL
pH: 6 (ref 5.0–8.0)

## 2012-09-18 LAB — CBC WITH DIFFERENTIAL/PLATELET
Hemoglobin: 12.6 g/dL — ABNORMAL LOW (ref 13.0–17.0)
Lymphs Abs: 4.5 10*3/uL — ABNORMAL HIGH (ref 0.7–4.0)
Monocytes Relative: 4 % (ref 3–12)
Neutro Abs: 12.9 10*3/uL — ABNORMAL HIGH (ref 1.7–7.7)
Neutrophils Relative %: 71 % (ref 43–77)
Platelets: 345 10*3/uL (ref 150–400)
RBC: 4.28 MIL/uL (ref 4.22–5.81)
WBC: 18.2 10*3/uL — ABNORMAL HIGH (ref 4.0–10.5)

## 2012-09-18 LAB — COMPREHENSIVE METABOLIC PANEL
ALT: 15 U/L (ref 0–53)
Albumin: 4.4 g/dL (ref 3.5–5.2)
Alkaline Phosphatase: 116 U/L (ref 39–117)
BUN: 26 mg/dL — ABNORMAL HIGH (ref 6–23)
Chloride: 87 mEq/L — ABNORMAL LOW (ref 96–112)
GFR calc Af Amer: >90 mL/min (ref >90)
Glucose, Bld: 346 mg/dL — ABNORMAL HIGH (ref 70–99)
Potassium: 4.9 mEq/L (ref 3.5–5.1)
Sodium: 130 mEq/L — ABNORMAL LOW (ref 135–145)
Total Bilirubin: 0.3 mg/dL (ref 0.3–1.2)

## 2012-09-18 MED ORDER — SODIUM CHLORIDE 0.9 % IV BOLUS (SEPSIS)
1000.0000 mL | Freq: Once | INTRAVENOUS | Status: AC
  Administered 2012-09-18: 1000 mL via INTRAVENOUS

## 2012-09-18 MED ORDER — DEXTROSE 50 % IV SOLN: 25.0000 mL | INTRAVENOUS | Status: DC | PRN

## 2012-09-18 MED ORDER — DEXTROSE-NACL 5-0.45 % IV SOLN
INTRAVENOUS | Status: DC
  Administered 2012-09-19: 1000 mL via INTRAVENOUS

## 2012-09-18 MED ORDER — ONDANSETRON HCL 4 MG/2ML IJ SOLN
4.0000 mg | Freq: Four times a day (QID) | INTRAMUSCULAR | Status: DC | PRN
  Administered 2012-09-19: 4 mg via INTRAVENOUS
  Filled 2012-09-18: qty 2

## 2012-09-18 MED ORDER — SODIUM CHLORIDE 0.9 % IV SOLN
INTRAVENOUS | Status: DC
  Administered 2012-09-18: 23:00:00 via INTRAVENOUS

## 2012-09-18 MED ORDER — ONDANSETRON HCL 4 MG/2ML IJ SOLN
4.0000 mg | Freq: Once | INTRAMUSCULAR | Status: AC
  Administered 2012-09-18: 4 mg via INTRAVENOUS
  Filled 2012-09-18: qty 2

## 2012-09-18 MED ORDER — SODIUM CHLORIDE 0.9 % IV SOLN
INTRAVENOUS | Status: DC
  Administered 2012-09-18: 21:00:00 via INTRAVENOUS
  Administered 2012-09-19: 1.8 [IU]/h via INTRAVENOUS
  Filled 2012-09-18 (×2): qty 1

## 2012-09-18 MED ORDER — INSULIN REGULAR BOLUS VIA INFUSION
0.0000 [IU] | Freq: Three times a day (TID) | INTRAVENOUS | Status: DC
  Filled 2012-09-18: qty 10

## 2012-09-18 MED ORDER — HYDROMORPHONE HCL PF 1 MG/ML IJ SOLN
1.0000 mg | Freq: Once | INTRAMUSCULAR | Status: AC
  Administered 2012-09-18: 1 mg via INTRAVENOUS
  Filled 2012-09-18: qty 1

## 2012-09-18 NOTE — ED Notes (Signed)
Patient is argumentative regarding his care at times, family at bedside.

## 2012-09-18 NOTE — ED Notes (Signed)
Vomiting since yesterday morning. Last CBG was 596 at home and states he is spilling ketones. Pt also states he has not been able to take his methadone because he knew he couldn't keep it down.

## 2012-09-18 NOTE — ED Notes (Signed)
Per physician, pt may eat.

## 2012-09-18 NOTE — ED Notes (Signed)
Pt states he feels like he wants to eat, advised the doctor would be notified of his request.

## 2012-09-18 NOTE — ED Provider Notes (Addendum)
History   This chart was scribed for Benny Lennert, MD, by Frederik Pear. The patient was seen in room APA08/APA08 and the patient's care was started at 1754.    CSN: [id number]  Arrival date & time 09/18/12  1736   First MD Initiated Contact with Patient 09/18/12 1754      Chief Complaint  Patient presents with  . Hyperglycemia  . Emesis    (Consider location/radiation/quality/duration/timing/severity/associated sxs/prior treatment) HPI Comments: Tyler Mann is a 31 y.o. male with a h/o of DM who presents to the Emergency Department complaining of severe, regular vomiting with associated abdominal pain and hyperglycemia that began yesterday morning. He also reports that his last CBG was 596 at home, and that he is spilling ketones. Pt is also a methadone pt and has not had a treatment since yesterday.    PCP is Dr. Juanetta Gosling.  Patient is a 31 y.o. male presenting with vomiting. The history is provided by the patient.  Emesis  This is a new problem. The current episode started yesterday. The problem occurs more than 10 times per day. The problem has not changed since onset.The emesis has an appearance of stomach contents. There has been no fever. Fever duration: No fever. Associated symptoms include abdominal pain. Pertinent negatives include no cough, no diarrhea and no headaches.    Past Medical History  Diagnosis Date  . Diabetes mellitus   . Neuropathy     History reviewed. No pertinent past surgical history.  No family history on file.  History  Substance Use Topics  . Smoking status: Current Every Day Smoker -- 0.5 packs/day for 17 years    Types: Cigarettes  . Smokeless tobacco: Not on file  . Alcohol Use: No      Review of Systems  Constitutional: Negative for fatigue.  HENT: Negative for congestion, sinus pressure and ear discharge.   Eyes: Negative for discharge.  Respiratory: Negative for cough.   Cardiovascular: Negative for chest pain.   Hyperglycemia  Gastrointestinal: Positive for vomiting and abdominal pain. Negative for diarrhea.  Genitourinary: Negative for frequency and hematuria.  Musculoskeletal: Negative for back pain.  Skin: Negative for rash.  Neurological: Negative for seizures and headaches.  Hematological: Negative.   Psychiatric/Behavioral: Negative for hallucinations.    Allergies  Review of patient's allergies indicates no known allergies.  Home Medications   Current Outpatient Rx  Name Route Sig Dispense Refill  . ACETAMINOPHEN 325 MG PO TABS Oral Take 650-975 mg by mouth 2 (two) times daily as needed. For pain    . GABAPENTIN 300 MG PO CAPS Oral Take 600 mg by mouth 2 (two) times daily.     . INSULIN GLARGINE 100 UNIT/ML [place] SOLN Subcutaneous Inject 22 Units into the skin at bedtime.    . INSULIN LISPRO (HUMAN) 100 UNIT/ML [place] SOLN Subcutaneous Inject 10-16 Units into the skin 3 (three) times daily before meals. As directed per sliding scale    . METHADONE HCL 10 MG/ML PO CONC Oral Take 100-120 mg by mouth 2 (two) times daily. *Give 120mg  daily in the morning and 100mg  in the evening    . PREGABALIN 100 MG PO CAPS Oral Take 100 mg by mouth 2 (two) times daily.    Marland Kitchen SIMVASTATIN 10 MG PO TABS Oral Take 10 mg by mouth at bedtime.    Marland Kitchen ZOLPIDEM TARTRATE 10 MG PO TABS Oral Take 10 mg by mouth at bedtime.      BP 114/66  Pulse 94  Temp 97.9 F (36.6 C) (Oral)  Resp 16  Ht 6' (1.829 m)  Wt 156 lb (70.761 kg)  BMI 21.16 kg/m2  SpO2 100%  Physical Exam  Nursing note and vitals reviewed. Constitutional: He is oriented to person, place, and time. He appears well-developed.  HENT:  Head: Normocephalic and atraumatic.       Mucus membranes are dry.  Eyes: Conjunctivae normal and EOM are normal. No scleral icterus.  Neck: Neck supple. No thyromegaly present.  Cardiovascular: Normal rate and regular rhythm.  Exam reveals no gallop and no friction rub.   No murmur heard. Pulmonary/Chest: No stridor. He  has no wheezes. He has no rales. He exhibits no tenderness.  Abdominal: He exhibits no distension. There is no tenderness. There is no rebound.  Musculoskeletal: Normal range of motion. He exhibits tenderness. He exhibits no edema.       Mild tenderness throughout.  Lymphadenopathy:    He has no cervical adenopathy.  Neurological: He is oriented to person, place, and time. Coordination normal.  Skin: No rash noted. No erythema.  Psychiatric: He has a normal mood and affect. His behavior is normal.    ED Course  Procedures (including critical care time)  DIAGNOSTIC STUDIES: Oxygen Saturation is 100% on room air, normal by my interpretation.    COORDINATION OF CARE:  18:01- Discussed planned course of treatment with the patient, including pain medication and blood work, who is agreeable at this time.  18:15- Medication Orders- sodium chloride 0.9% bolus 1,000 mL-Once, sodium chloride 0.9% bolus 1,000 mL- Once, ondansetron (ZOFRAN) injection 4 mg- Once, HYDROmorphone (DILAUDID) injection 1 mg- Once.  Results for orders placed during the hospital encounter of 09/18/12  GLUCOSE, CAPILLARY      Component Value Range   Glucose-Capillary 344 (*) 70 - 99 mg/dL     No diagnosis found.  CRITICAL CARE Performed by: Dejuana Weist L   Total critical care time:40  Critical care time was exclusive of separately billable procedures and treating other patients.  Critical care was necessary to treat or prevent imminent or life-threatening deterioration.  Critical care was time spent personally by me on the following activities: development of treatment plan with patient and/or surrogate as well as nursing, discussions with consultants, evaluation of patient's response to treatment, examination of patient, obtaining history from patient or surrogate, ordering and performing treatments and interventions, ordering and review of laboratory studies, ordering and review of radiographic studies,  pulse oximetry and re-evaluation of patient's condition.   MDM    The chart was scribed for me under my direct supervision.  I personally performed the history, physical, and medical decision making and all procedures in the evaluation of this patient.Benny Lennert, MD 09/18/12 2117  Benny Lennert, MD 09/18/12 [phone]

## 2012-09-18 NOTE — ED Notes (Signed)
Pt attempted to eat, but states he feels nauseated when doing so.  No insulin coverage provided.

## 2012-09-19 ENCOUNTER — Encounter (HOSPITAL_COMMUNITY): Payer: Self-pay | Admitting: *Deleted

## 2012-09-19 LAB — GLUCOSE, CAPILLARY
Glucose-Capillary: 134 mg/dL — ABNORMAL HIGH (ref 70–99)
Glucose-Capillary: 152 mg/dL — ABNORMAL HIGH (ref 70–99)
Glucose-Capillary: 150 mg/dL — ABNORMAL HIGH (ref 70–99)
Glucose-Capillary: 181 mg/dL — ABNORMAL HIGH (ref 70–99)
Glucose-Capillary: 126 mg/dL — ABNORMAL HIGH (ref 70–99)
Glucose-Capillary: 128 mg/dL — ABNORMAL HIGH (ref 70–99)
Glucose-Capillary: 141 mg/dL — ABNORMAL HIGH (ref 70–99)

## 2012-09-19 LAB — BASIC METABOLIC PANEL
BUN: 14 mg/dL (ref 6–23)
BUN: 11 mg/dL (ref 6–23)
BUN: 7 mg/dL (ref 6–23)
CO2: 22 mEq/L (ref 19–32)
Calcium: 8.7 mg/dL (ref 8.4–10.5)
Calcium: 9 mg/dL (ref 8.4–10.5)
Chloride: 96 mEq/L (ref 96–112)
Chloride: 97 mEq/L (ref 96–112)
Creatinine, Ser: 0.72 mg/dL (ref 0.50–1.35)
Creatinine, Ser: 0.71 mg/dL (ref 0.50–1.35)
Creatinine, Ser: 0.58 mg/dL (ref 0.50–1.35)
GFR calc Af Amer: >90 mL/min (ref >90)
GFR calc Af Amer: >90 mL/min (ref >90)
GFR calc non Af Amer: >90 mL/min (ref >90)
Glucose, Bld: 165 mg/dL — ABNORMAL HIGH (ref 70–99)
Glucose, Bld: 248 mg/dL — ABNORMAL HIGH (ref 70–99)
Glucose, Bld: 211 mg/dL — ABNORMAL HIGH (ref 70–99)
Potassium: 4.1 mEq/L (ref 3.5–5.1)

## 2012-09-19 LAB — RAPID URINE DRUG SCREEN, HOSP PERFORMED
Amphetamines: NOT DETECTED
Benzodiazepines: NOT DETECTED
Cocaine: NOT DETECTED
Opiates: NOT DETECTED

## 2012-09-19 LAB — CBC
Platelets: 296 10*3/uL (ref 150–400)
RBC: 3.7 MIL/uL — ABNORMAL LOW (ref 4.22–5.81)
RDW: 14.5 % (ref 11.5–15.5)
WBC: 14.8 10*3/uL — ABNORMAL HIGH (ref 4.0–10.5)

## 2012-09-19 LAB — MRSA PCR SCREENING: MRSA by PCR: NEGATIVE

## 2012-09-19 MED ORDER — SODIUM CHLORIDE 0.9 % IV SOLN
INTRAVENOUS | Status: DC
  Administered 2012-09-19 (×2): via INTRAVENOUS

## 2012-09-19 MED ORDER — POTASSIUM CHLORIDE 10 MEQ/100ML IV SOLN
10.0000 meq | INTRAVENOUS | Status: AC
  Administered 2012-09-19 (×2): 10 meq via INTRAVENOUS
  Filled 2012-09-19: qty 200

## 2012-09-19 MED ORDER — INSULIN NPH (HUMAN) (ISOPHANE) 100 UNIT/ML ~~LOC~~ SUSP
10.0000 [IU] | Freq: Once | SUBCUTANEOUS | Status: AC
  Administered 2012-09-19: 10 [IU] via SUBCUTANEOUS
  Filled 2012-09-19: qty 10

## 2012-09-19 MED ORDER — GABAPENTIN 300 MG PO CAPS
300.0000 mg | ORAL_CAPSULE | Freq: Two times a day (BID) | ORAL | Status: DC
  Administered 2012-09-19 – 2012-09-21 (×6): 300 mg via ORAL
  Filled 2012-09-19 (×5): qty 1

## 2012-09-19 MED ORDER — METHADONE HCL 10 MG/ML PO CONC: 100.0000 mg | Freq: Two times a day (BID) | ORAL | Status: DC

## 2012-09-19 MED ORDER — INSULIN ASPART 100 UNIT/ML ~~LOC~~ SOLN
8.0000 [IU] | Freq: Three times a day (TID) | SUBCUTANEOUS | Status: DC
  Administered 2012-09-19 – 2012-09-20 (×2): 8 [IU] via SUBCUTANEOUS

## 2012-09-19 MED ORDER — INSULIN ASPART 100 UNIT/ML ~~LOC~~ SOLN
0.0000 [IU] | Freq: Three times a day (TID) | SUBCUTANEOUS | Status: DC
  Administered 2012-09-20: 5 [IU] via SUBCUTANEOUS

## 2012-09-19 MED ORDER — METHADONE HCL 10 MG PO TABS
100.0000 mg | ORAL_TABLET | Freq: Every day | ORAL | Status: DC
  Administered 2012-09-19: 100 mg via ORAL
  Administered 2012-09-20: 20 mg via ORAL
  Filled 2012-09-19: qty 10

## 2012-09-19 MED ORDER — SIMVASTATIN 20 MG PO TABS
20.0000 mg | ORAL_TABLET | Freq: Every day | ORAL | Status: DC
  Administered 2012-09-19 – 2012-09-20 (×2): 20 mg via ORAL
  Filled 2012-09-19: qty 1

## 2012-09-19 MED ORDER — METHADONE HCL 10 MG PO TABS
120.0000 mg | ORAL_TABLET | Freq: Every day | ORAL | Status: DC
  Administered 2012-09-19 – 2012-09-21 (×3): 120 mg via ORAL
  Filled 2012-09-19: qty 2
  Filled 2012-09-19 (×2): qty 12
  Filled 2012-09-19: qty 10

## 2012-09-19 MED ORDER — INSULIN ASPART 100 UNIT/ML ~~LOC~~ SOLN: 8.0000 [IU] | Freq: Three times a day (TID) | SUBCUTANEOUS | Status: DC

## 2012-09-19 MED ORDER — INSULIN GLARGINE 100 UNIT/ML ~~LOC~~ SOLN
20.0000 [IU] | Freq: Every day | SUBCUTANEOUS | Status: DC
  Administered 2012-09-19: 21:00:00 via SUBCUTANEOUS
  Administered 2012-09-20: 20 [IU] via SUBCUTANEOUS

## 2012-09-19 MED ORDER — POTASSIUM CHLORIDE 10 MEQ/100ML IV SOLN
10.0000 meq | INTRAVENOUS | Status: AC
  Administered 2012-09-19 (×2): 10 meq via INTRAVENOUS

## 2012-09-19 MED ORDER — ENOXAPARIN SODIUM 40 MG/0.4ML ~~LOC~~ SOLN
40.0000 mg | SUBCUTANEOUS | Status: DC
  Administered 2012-09-19 – 2012-09-20 (×2): 40 mg via SUBCUTANEOUS
  Filled 2012-09-19 (×3): qty 0.4

## 2012-09-19 MED ORDER — SODIUM CHLORIDE 0.9 % IV SOLN: 1.0000 mg/h | Freq: Once | INTRAVENOUS | Status: DC

## 2012-09-19 MED ORDER — INFLUENZA VIRUS VACC SPLIT PF IM SUSP
0.5000 mL | INTRAMUSCULAR | Status: AC
  Administered 2012-09-19: 0.5 mL via INTRAMUSCULAR
  Filled 2012-09-19: qty 0.5

## 2012-09-19 MED ORDER — HYDROMORPHONE HCL PF 1 MG/ML IJ SOLN
1.0000 mg | Freq: Once | INTRAMUSCULAR | Status: AC
  Administered 2012-09-19: 1 mg via INTRAVENOUS
  Filled 2012-09-19: qty 1

## 2012-09-19 MED ORDER — POTASSIUM CHLORIDE 10 MEQ/100ML IV SOLN
INTRAVENOUS | Status: AC
  Administered 2012-09-19: 10 meq via INTRAVENOUS
  Filled 2012-09-19: qty 200

## 2012-09-19 MED ORDER — PREGABALIN 50 MG PO CAPS
100.0000 mg | ORAL_CAPSULE | Freq: Two times a day (BID) | ORAL | Status: DC
  Administered 2012-09-19 – 2012-09-21 (×6): 100 mg via ORAL
  Filled 2012-09-19: qty 2
  Filled 2012-09-19 (×5): qty 1

## 2012-09-19 NOTE — Progress Notes (Signed)
ANION GAP 14. PT REMAINS ON INSULIN DRIP W/ HOURLY CBG'S

## 2012-09-19 NOTE — Progress Notes (Addendum)
NA 132 CHL 89; CO2 22; ANION GAP 21. INSULIN DRIP STOPPED AS ORDERED.

## 2012-09-19 NOTE — H&P (Signed)
[facility] MRN: [id number] DOB/AGE: [birth year] 33 y.o. Primary Care Physician:Kathlyn Leachman L, MD Admit date: 09/18/2012 Chief Complaint: Diabetic ketoacidosis HPI: This is a 31 year old type I diabetic who has been hospitalized on multiple occasions with diabetic ketoacidosis. His problem had been complicated by illicit drug use which he stopped about a year ago. Since then his diabetes has been better controlled. He says he feels better. He came to the emergency with nausea vomiting and diarrhea. He is on a continuous insulin infusion.  Past Medical History  Diagnosis Date  . Diabetes mellitus   . Neuropathy    History reviewed. No pertinent past surgical history.      History reviewed. No pertinent family history. his mother has multiple sclerosis  Social History:  reports that he has been smoking Cigarettes.  He has a 8.5 pack-year smoking history. He does not have any smokeless tobacco history on file. He reports that he does not drink alcohol or use illicit drugs.   Allergies: No Known Allergies  Medications Prior to Admission  Medication Sig Dispense Refill  . acetaminophen (TYLENOL) 325 MG tablet Take 650-975 mg by mouth 2 (two) times daily as needed. For pain      . gabapentin (NEURONTIN) 300 MG capsule Take 600 mg by mouth 2 (two) times daily.       . insulin glargine (LANTUS) 100 UNIT/ML injection Inject 22 Units into the skin at bedtime.      . insulin lispro (HUMALOG) 100 UNIT/ML injection Inject 10-16 Units into the skin 3 (three) times daily before meals. As directed per sliding scale      . methadone (DOLOPHINE) 10 MG/ML solution Take 100-120 mg by mouth 2 (two) times daily. *Give 120mg  daily in the morning and 100mg  in the evening      . pregabalin (LYRICA) 100 MG capsule Take 100 mg by mouth 2 (two) times daily.      . simvastatin (ZOCOR) 10 MG tablet Take 10 mg by mouth at bedtime.      Marland Kitchen zolpidem (AMBIEN) 10 MG tablet Take 10 mg by mouth at bedtime.            DGU:YQIHK from the symptoms mentioned above,there are no other symptoms referable to all systems reviewed.  Physical Exam: Blood pressure 120/70, pulse 81, temperature 97.9 F (36.6 C), temperature source Oral, resp. rate 13, height 6\' 1"  (1.854 m), weight 69.4 kg (153 lb), SpO2 98.00%. He is awake and alert. He is asking for something to eat. His pupils are reactive nose and throat are clear. Neck is supple without masses. His chest is clear. His heart is regular without gallop. His abdomen is soft without masses bowel sounds are present and active. Extremities showed no edema. Central nervous system examination is grossly intact    Basename 09/19/12 0434 09/18/12 1740  WBC 14.8* 18.2*  NEUTROABS -- 12.9*  HGB 10.9* 12.6*  HCT 32.9* 38.0*  MCV 88.9 88.8  PLT 296 345    Basename 09/19/12 0434 09/19/12 0011  NA 133* 133*  K 4.6 4.1  CL 98 97  CO2 18* 19  GLUCOSE 165* 170*  BUN 14 19  CREATININE 0.72 0.79  CALCIUM 8.7 8.9  MG -- --  lablast2(ast:2,ALT:2,alkphos:2,bilitot:2,prot:2,albumin:2)@    Recent Results (from the past 240 hour(s))  MRSA PCR SCREENING     Status: Normal   Collection Time   09/18/12 11:36 PM      Component Value Range Status Comment   MRSA by PCR NEGATIVE  NEGATIVE Final      No results found. Impression: He has diabetic ketoacidosis. He has long-term diabetes. He has a history of illicit drug abuse but has stopped that and his diabetes has been better controlled. He says he is hungry but he is not at goal for his insulin drip. After discussion I have reluctantly put him on clear liquids for the moment Active Problems:  * No active hospital problems. *      Plan: I will ask for endocrinology consultation. He will continue his insulin drip because he is still acidotic based on his bicarbonate level.      Skylier Kretschmer L Pager [phone]  09/19/2012, 8:46 AM

## 2012-09-19 NOTE — Consult Note (Signed)
  [id number] 

## 2012-09-19 NOTE — Progress Notes (Signed)
UR Chart Review Completed  

## 2012-09-19 NOTE — Plan of Care (Signed)
Problem: Consults Goal: Diabetic Ketoacidosis (DKA) Patient Education See Patient Education Modules for education specifics. Outcome: Progressing Patient readmitted with longstanding illness. Type 1 diabetic Goal: Nutrition Consult-if indicated Outcome: Progressing Patient has lost 30lbs in last 5 months Goal: Diabetes Guidelines if Diabetic/Glucose > 140 If diabetic or lab glucose is > 140 mg/dl - Initiate Diabetes/Hyperglycemia Guidelines & Document Interventions  Outcome: Completed/Met Date Met:  09/19/12 Presently on glucostabilizer  Problem: Phase I Progression Outcomes Goal: Monitor hydration status Outcome: Progressing D51/2 NS running at 125cc/hr Goal: Initial discharge plan identified Outcome: Progressing Home with parents Goal: Voiding-avoid urinary catheter unless indicated Outcome: Not Progressing Patient has not voided since admission at 2300

## 2012-09-19 NOTE — Consult Note (Signed)
NAME:  Tyler Mann, Tyler Mann NO.:  [id number]  MEDICAL RECORD NO.:  [id number]  LOCATION:  IC02                          FACILITY:  [facility]  PHYSICIAN:  Purcell Nails, MD DATE OF BIRTH:  [birth year]  DATE OF CONSULTATION:  09/19/2012 DATE OF DISCHARGE:                                CONSULTATION   REASON FOR CONSULT:  Diabetic ketoacidosis.  HISTORY OF PRESENT ILLNESS:  This is a 31 year old gentleman who is familiar to me from outpatient consult as well as prior visits.  [name] is a type 1 diabetic for the last 25 years with history significant for noncompliance and poor control.  I have seen him on multiple occasions as an outpatient.  His most recent A1c of 10.7% was actually an improvement for him in September, 2013 at which time, he was on basal bolus insulin involving Levemir 22 units at bedtime and Humalog 11 units t.i.d. a.c. plus sliding scale.  At this time he comes in with a complaint of nausea, vomiting, and hyperglycemia and was found to have diabetic ketoacidosis.  He has multiple admissions in the past for diabetic ketoacidosis.  However he has successfully avoided hospitalizations for at least 1 year due to relatively better compliance.  He denied any chest pain, fever; however, he did have abdominal pain  and vomiting and some diarrhea.  He was put on insulin drip to stabilize his numbers and this morning, he feels much better, although, he is asking for food.  PAST MEDICAL HISTORY:  Type 1 diabetes, multiple drug abuse, pain syndrome, and neuropathy.  PAST SURGICAL HISTORY:  Nonsignificant.  SOCIAL HISTORY:  Positive for smoking.  He has at least 10 years of smoking history.  He had also used some illicit drugs in the past. Reports no alcohol intake at this time.  ALLERGIES:  No allergies.  HOME MEDICATIONS:  Include Neurontin, Levemir 22 units at bedtime and Humalog 11 units t.i.d. a.c. plus sliding scale.  He is on methadone, Lyrica,  simvastatin, and Ambien.  REVIEW OF SYSTEMS:  As in HPI.  All others are reviewed and negative.  PHYSICAL EXAMINATION:  GENERAL:  He is alert and oriented x3, not in acute distress. VITAL SIGNS:  Stable at blood pressure 114/61, pulse rate 81, temperature 97.9. HEENT:  Slightly dry mucous membranes. NECK:  Negative for JVD or thyromegaly. CHEST:  Clear to auscultation bilaterally.  CARDIOVASCULAR:  Normal S1, S2.  No murmur.  No gallop. ABDOMEN:  Soft and nontender.  He has positive bowel sounds. EXTREMITIES:  No edema. CNS:  Nonfocal.  LABORATORY DATA:  His most recent lab show sodium 132, potassium 4.4, chloride 96, bicarb 22, BUN 11, and creatinine 0.7.  ASSESSMENT: 1. Diabetic ketoacidosis, resolving. 2. Type 1 diabetes chronically uncontrolled.  Last A1c was 10.7% on     August 05, 2012.  PLAN:  Given his stabilized chemistry and out of anion gap acidosis I will consider converting to basal bolus insulin today.  He will be allowed to take clear liquids to advance as tolerated up to 2000 calories daily.  Better to keep the patient in ICU for close monitoring for at least today and plan to transfer to  the regular floors if everything is stable by tomorrow.  I will continue to follow him in- house as well as an outpatient.          ______________________________ Purcell Nails, MD     GN/MEDQ  D:  09/19/2012  T:  09/19/2012  Job:  [id number]

## 2012-09-19 NOTE — Progress Notes (Signed)
Nutrition Brief Note  Patient identified on the Malnutrition Screening Tool (MST) Report  Body mass index is 20.19 kg/(m^2). Pt meets criteria for Normal Range based on current BMI.   Current diet order is Clear Liquids,meal intake percentage not available at this time. Labs and medications reviewed.   No nutrition interventions warranted at this time. If nutrition issues arise, please consult RD.   #[phone]

## 2012-09-20 DIAGNOSIS — E785 Hyperlipidemia, unspecified: Secondary | ICD-10-CM | POA: Diagnosis present

## 2012-09-20 DIAGNOSIS — E1110 Type 2 diabetes mellitus with ketoacidosis without coma: Secondary | ICD-10-CM | POA: Diagnosis present

## 2012-09-20 DIAGNOSIS — E109 Type 1 diabetes mellitus without complications: Secondary | ICD-10-CM | POA: Diagnosis present

## 2012-09-20 DIAGNOSIS — G8929 Other chronic pain: Secondary | ICD-10-CM | POA: Diagnosis present

## 2012-09-20 LAB — BASIC METABOLIC PANEL
Chloride: 107 mEq/L (ref 96–112)
Creatinine, Ser: 0.6 mg/dL (ref 0.50–1.35)
GFR calc Af Amer: >90 mL/min (ref >90)
GFR calc non Af Amer: >90 mL/min (ref >90)
Potassium: 3.8 mEq/L (ref 3.5–5.1)

## 2012-09-20 LAB — GLUCOSE, CAPILLARY
Glucose-Capillary: 275 mg/dL — ABNORMAL HIGH (ref 70–99)
Glucose-Capillary: 306 mg/dL — ABNORMAL HIGH (ref 70–99)

## 2012-09-20 MED ORDER — SIMVASTATIN 20 MG PO TABS
ORAL_TABLET | ORAL | Status: AC
  Administered 2012-09-20: 20 mg via ORAL
  Filled 2012-09-20: qty 1

## 2012-09-20 MED ORDER — GABAPENTIN 300 MG PO CAPS
ORAL_CAPSULE | ORAL | Status: AC
  Administered 2012-09-20: 22:00:00
  Filled 2012-09-20: qty 1

## 2012-09-20 MED ORDER — METHADONE HCL 40 MG PO TBSO
ORAL_TABLET | ORAL | Status: AC
  Administered 2012-09-20: 80 mg
  Filled 2012-09-20: qty 2

## 2012-09-20 MED ORDER — METHADONE HCL 10 MG PO TABS
ORAL_TABLET | ORAL | Status: AC
  Administered 2012-09-20: 22:00:00
  Filled 2012-09-20: qty 2

## 2012-09-20 MED ORDER — INSULIN ASPART 100 UNIT/ML ~~LOC~~ SOLN
0.0000 [IU] | Freq: Three times a day (TID) | SUBCUTANEOUS | Status: DC
  Administered 2012-09-20: 7 [IU] via SUBCUTANEOUS
  Administered 2012-09-21: 1 [IU] via SUBCUTANEOUS

## 2012-09-20 MED ORDER — SODIUM CHLORIDE 0.9 % IJ SOLN: 3.0000 mL | Freq: Two times a day (BID) | INTRAMUSCULAR | Status: DC

## 2012-09-20 MED ORDER — PREGABALIN 50 MG PO CAPS
ORAL_CAPSULE | ORAL | Status: AC
  Administered 2012-09-20: 22:00:00
  Filled 2012-09-20: qty 2

## 2012-09-20 NOTE — Progress Notes (Signed)
Subjective: He says he feels better. He's eating without any difficulty. He has no nausea. His blood sugars are better. His acid base balance is normal  Objective: Vital signs in last 24 hours: Temp:  [97.9 F (36.6 C)-98.3 F (36.8 C)] 97.9 F (36.6 C) (11/02 0411) Resp:  [9-18] 9  (11/02 0600) BP: (107-131)/(58-80) 121/74 mmHg (11/02 0400) Weight:  [72.9 kg (160 lb 11.5 oz)] 72.9 kg (160 lb 11.5 oz) (11/02 4540) Weight change: 2.139 kg (4 lb 11.5 oz) Last BM Date: 09/18/12  Intake/Output from previous day: 11/01 0701 - 11/02 0700 In: 4671.1 [P.O.:2220; I.V.:2251.1; IV Piggyback:200] Out: 3250 [Urine:3250]  PHYSICAL EXAM General appearance: alert, cooperative and no distress Resp: clear to auscultation bilaterally Cardio: regular rate and rhythm, S1, S2 normal, no murmur, click, rub or gallop GI: soft, non-tender; bowel sounds normal; no masses,  no organomegaly Extremities: extremities normal, atraumatic, no cyanosis or edema  Lab Results:    Basic Metabolic Panel:  Basename 09/20/12 0454 09/19/12 1255  NA 142 132*  K 3.8 4.2  CL 107 99  CO2 28 22  GLUCOSE 73 211*  BUN 4* 7  CREATININE 0.60 0.58  CALCIUM 9.2 9.0  MG -- --  PHOS -- --   Liver Function Tests:  Basename 09/18/12 1740  AST 17  ALT 15  ALKPHOS 116  BILITOT 0.3  PROT 8.1  ALBUMIN 4.4   No results found for this basename: LIPASE:2,AMYLASE:2 in the last 72 hours No results found for this basename: AMMONIA:2 in the last 72 hours CBC:  Basename 09/19/12 0434 09/18/12 1740  WBC 14.8* 18.2*  NEUTROABS -- 12.9*  HGB 10.9* 12.6*  HCT 32.9* 38.0*  MCV 88.9 88.8  PLT 296 345   Cardiac Enzymes: No results found for this basename: CKTOTAL:3,CKMB:3,CKMBINDEX:3,TROPONINI:3 in the last 72 hours BNP: No results found for this basename: PROBNP:3 in the last 72 hours D-Dimer: No results found for this basename: DDIMER:2 in the last 72 hours CBG:  Basename 09/20/12 0725 09/19/12 2058 09/19/12 1615  09/19/12 1355 09/19/12 1250 09/19/12 1128  GLUCAP 86 141* 107* 128* 186* 153*   Hemoglobin A1C: No results found for this basename: HGBA1C in the last 72 hours Fasting Lipid Panel: No results found for this basename: CHOL,HDL,LDLCALC,TRIG,CHOLHDL,LDLDIRECT in the last 72 hours Thyroid Function Tests: No results found for this basename: TSH,T4TOTAL,FREET4,T3FREE,THYROIDAB in the last 72 hours Anemia Panel: No results found for this basename: VITAMINB12,FOLATE,FERRITIN,TIBC,IRON,RETICCTPCT in the last 72 hours Coagulation: No results found for this basename: LABPROT:2,INR:2 in the last 72 hours Urine Drug Screen: Drugs of Abuse     Component Value Date/Time   LABOPIA NONE DETECTED 09/19/2012 1438   COCAINSCRNUR NONE DETECTED 09/19/2012 1438   LABBENZ NONE DETECTED 09/19/2012 1438   AMPHETMU NONE DETECTED 09/19/2012 1438   THCU NONE DETECTED 09/19/2012 1438   LABBARB NONE DETECTED 09/19/2012 1438    Alcohol Level: No results found for this basename: ETH:2 in the last 72 hours Urinalysis:  Basename 09/18/12 1932  COLORURINE YELLOW  LABSPEC >1.030*  PHURINE 6.0  GLUCOSEU 500*  HGBUR NEGATIVE  BILIRUBINUR SMALL*  KETONESUR >80*  PROTEINUR NEGATIVE  UROBILINOGEN 0.2  NITRITE NEGATIVE  LEUKOCYTESUR NEGATIVE   Misc. Labs:  ABGS No results found for this basename: PHART,PCO2,PO2ART,TCO2,HCO3 in the last 72 hours CULTURES Recent Results (from the past 240 hour(s))  MRSA PCR SCREENING     Status: Normal   Collection Time   09/18/12 11:36 PM      Component Value Range Status Comment  MRSA by PCR NEGATIVE  NEGATIVE Final    Studies/Results: No results found.  Medications:  Prior to Admission:  Prescriptions prior to admission  Medication Sig Dispense Refill  . acetaminophen (TYLENOL) 325 MG tablet Take 650-975 mg by mouth 2 (two) times daily as needed. For pain      . gabapentin (NEURONTIN) 300 MG capsule Take 600 mg by mouth 2 (two) times daily.       . insulin glargine  (LANTUS) 100 UNIT/ML injection Inject 22 Units into the skin at bedtime.      . insulin lispro (HUMALOG) 100 UNIT/ML injection Inject 10-16 Units into the skin 3 (three) times daily before meals. As directed per sliding scale      . methadone (DOLOPHINE) 10 MG/ML solution Take 100-120 mg by mouth 2 (two) times daily. *Give 120mg  daily in the morning and 100mg  in the evening      . pregabalin (LYRICA) 100 MG capsule Take 100 mg by mouth 2 (two) times daily.      . simvastatin (ZOCOR) 10 MG tablet Take 10 mg by mouth at bedtime.      Marland Kitchen zolpidem (AMBIEN) 10 MG tablet Take 10 mg by mouth at bedtime.       Scheduled:   . enoxaparin (LOVENOX) injection  40 mg Subcutaneous Q24H  . gabapentin  300 mg Oral BID  . influenza  inactive virus vaccine  0.5 mL Intramuscular Tomorrow-1000  . insulin aspart  0-9 Units Subcutaneous TID WC  . insulin aspart  8 Units Subcutaneous TID WC  . insulin glargine  20 Units Subcutaneous QHS  . insulin NPH  10 Units Subcutaneous Once  . methadone  120 mg Oral Daily   And  . methadone  100 mg Oral QHS  . potassium chloride  10 mEq Intravenous Q1 Hr x 2  . pregabalin  100 mg Oral BID  . simvastatin  20 mg Oral q1800  . DISCONTD: insulin aspart  8 Units Subcutaneous TID WC  . DISCONTD: insulin regular  0-10 Units Intravenous TID WC   Continuous:   . sodium chloride 125 mL/hr at 09/20/12 0600  . dextrose 5 % and 0.45% NaCl 125 mL/hr at 09/19/12 0900  . DISCONTD: sodium chloride 150 mL/hr at 09/18/12 2303  . DISCONTD: insulin (NOVOLIN-R) infusion 1.1 Units/hr (09/19/12 0800)   ZOX:WRUEAVWU, ondansetron (ZOFRAN) IV  Assesment: He has diabetes and was in DKA. That has improved. I think he can move to the floor Active Problems:  * No active hospital problems. *     Plan: Transfer to regular floor. Probable discharge in the morning    LOS: 2 days   Edna Rede L 09/20/2012, 8:04 AM

## 2012-09-20 NOTE — Progress Notes (Signed)
PT ESCORTED BY RN TO ROOM 304.TOLERATED WELL.

## 2012-09-20 NOTE — Progress Notes (Signed)
Transfer report called to [employer] on 300. Pt alert and oriented. Lunch time cbg 275. ssi And meal coverage given. Skin warm and dry. Rt ac nsl  Patent. Parents at bedside.

## 2012-09-20 NOTE — Progress Notes (Signed)
NAME:  Tyler Mann, TEAGLE NO.:  [id number]  MEDICAL RECORD NO.:  [id number]  LOCATION:  IC02                          FACILITY:  [facility]  PHYSICIAN:  Purcell Nails, MD DATE OF BIRTH:  1981-06-21  DATE OF PROCEDURE:  09/20/2012 DATE OF DISCHARGE:                                PROGRESS NOTE   REASON FOR FOLLOWUP:  Diabetic ketoacidosis.  SUBJECTIVE:  The patient feels better this morning.  He did not have any hypoglycemia.  He is tolerating oral feeding.  OBJECTIVE:  GENERAL:  He is alert and oriented x3 in no acute distress. VITAL SIGNS:  Blood pressure of 104/59, pulse rate 88, temperature 97.8. HEENT:  Well hydrated. NECK:  Negative for JVD. CHEST:  Clear to auscultation bilaterally. CARDIOVASCULAR:  Normal S1 and S2.  No murmur.  No gallop. ABDOMEN:  Soft and nontender. EXTREMITIES:  No edema. CNS:  Nonfocal.  LABORATORY DATA:  Sodium 142, potassium 3.8, chloride 107, bicarb 28, BUN 4, creatinine 0.6.  ASSESSMENT: 1. Diabetic ketoacidosis, resolved. 2. Type 1 diabetes, chronically uncontrolled.  Last A1c was 10.7% on     August 05, 2012.  PLAN:  Continue basal bolus insulin.  Lantus 20 units at bedtime and NovoLog 8 units t.i.d. before meals plus sliding scale.  Continue to monitor blood glucose before meals and at bedtime.  The patient is consuming much less carbohydrate in the hospital compared to what he is consuming at home hence the need for Lasix while he is here.  He can be treated on regular floors outside of the ICU.  I will continue to follow up.          ______________________________ Purcell Nails, MD    GN/MEDQ  D:  09/20/2012  T:  09/20/2012  Job:  [id number]

## 2012-09-20 NOTE — Progress Notes (Signed)
Patient ID: Tyler Mann, male   DOB: 08/04/1981, 31 y.o.   MRN: [id number]

## 2012-09-21 LAB — GLUCOSE, CAPILLARY: Glucose-Capillary: 319 mg/dL — ABNORMAL HIGH (ref 70–99)

## 2012-09-21 MED ORDER — INSULIN ASPART 100 UNIT/ML ~~LOC~~ SOLN: 10.0000 [IU] | Freq: Three times a day (TID) | SUBCUTANEOUS | Status: DC

## 2012-09-21 MED ORDER — INSULIN GLARGINE 100 UNIT/ML ~~LOC~~ SOLN: 24.0000 [IU] | Freq: Every day | SUBCUTANEOUS | Status: DC

## 2012-09-21 NOTE — Progress Notes (Signed)
Patient very anxious about elevated blood sugar ( 306).  Notified MD and received order for night time coverage with sliding scale.  Insulin given per MD order.

## 2012-09-21 NOTE — Discharge Summary (Signed)
Physician Discharge Summary  Patient ID: Tyler Mann MRN: [id number] DOB/AGE: 12-25-1980 31 y.o. Primary Care Physician:Brooke Steinhilber L, MD Admit date: 09/18/2012 Discharge date: 09/21/2012    Discharge Diagnoses:   Principal Problem:  *Diabetic ketoacidosis Active Problems:  Diabetes mellitus type 1  Chronic pain  Hyperlipidemia     Medication List     As of 09/21/2012  9:53 AM    TAKE these medications         acetaminophen 325 MG tablet   Commonly known as: TYLENOL   Take 650-975 mg by mouth 2 (two) times daily as needed. For pain      gabapentin 300 MG capsule   Commonly known as: NEURONTIN   Take 600 mg by mouth 2 (two) times daily.      insulin glargine 100 UNIT/ML injection   Commonly known as: LANTUS   Inject 22 Units into the skin at bedtime.      insulin lispro 100 UNIT/ML injection   Commonly known as: HUMALOG   Inject 10-16 Units into the skin 3 (three) times daily before meals. As directed per sliding scale      methadone 10 MG/ML solution   Commonly known as: DOLOPHINE   Take 100-120 mg by mouth 2 (two) times daily. *Give 120mg  daily in the morning and 100mg  in the evening      pregabalin 100 MG capsule   Commonly known as: LYRICA   Take 100 mg by mouth 2 (two) times daily.      simvastatin 10 MG tablet   Commonly known as: ZOCOR   Take 10 mg by mouth at bedtime.      zolpidem 10 MG tablet   Commonly known as: AMBIEN   Take 10 mg by mouth at bedtime.        Discharged Condition: Improved    Consults: Endocrinology  Significant Diagnostic Studies: No results found.  Lab Results: Basic Metabolic Panel:  Basename 09/20/12 0454 09/19/12 1255  NA 142 132*  K 3.8 4.2  CL 107 99  CO2 28 22  GLUCOSE 73 211*  BUN 4* 7  CREATININE 0.60 0.58  CALCIUM 9.2 9.0  MG -- --  PHOS -- --   Liver Function Tests:  Basename 09/18/12 1740  AST 17  ALT 15  ALKPHOS 116  BILITOT 0.3  PROT 8.1  ALBUMIN 4.4     CBC:  Basename  09/19/12 0434 09/18/12 1740  WBC 14.8* 18.2*  NEUTROABS -- 12.9*  HGB 10.9* 12.6*  HCT 32.9* 38.0*  MCV 88.9 88.8  PLT 296 345    Recent Results (from the past 240 hour(s))  MRSA PCR SCREENING     Status: Normal   Collection Time   09/18/12 11:36 PM      Component Value Range Status Comment   MRSA by PCR NEGATIVE  NEGATIVE Final      Hospital Course: He was admitted with diabetic ketoacidosis. He was treated with intravenous insulin and improved. By the time of discharge was back to baseline. He had endocrinology consultation. His blood sugar was in the 100 to discharge he felt and looked better  Discharge Exam: Blood pressure 113/73, pulse 69, temperature 97.7 F (36.5 C), temperature source Oral, resp. rate 12, height 6\' 1"  (1.854 m), weight 72.9 kg (160 lb 11.5 oz), SpO2 98.00%. He is awake and alert. His chest is clear. His heart is regular. His abdomen is soft  Disposition: Home he does not want home health services  Discharge Orders    Future Orders Please Complete By Expires   Discharge patient           Signed: Fredirick Maudlin Pager [phone]  09/21/2012, 9:53 AM

## 2012-09-21 NOTE — Progress Notes (Signed)
Patient received discharge instructions along with follow up appointments and medication summary. Patient verbalized understanding of all instructions. Patient was escorted by staff to vehicle. Patient discharged to home in stable condition.

## 2012-09-21 NOTE — Progress Notes (Signed)
Subjective: He says he feels well. He feels that the problem with his blood sugar last night was the he did not get any insulin at all when his blood sugar was in the 70s. He is better this morning and blood sugars in the 100 range. He is anxious to go home  Objective: Vital signs in last 24 hours: Temp:  [97.7 F (36.5 C)-97.8 F (36.6 C)] 97.7 F (36.5 C) (11/03 0425) Pulse Rate:  [69-77] 69  (11/03 0425) Resp:  [12] 12  (11/03 0425) BP: (113-114)/(73-74) 113/73 mmHg (11/03 0425) SpO2:  [98 %] 98 % (11/03 0425) Weight change:  Last BM Date: 09/18/12  Intake/Output from previous day: 11/02 0701 - 11/03 0700 In: 960 [P.O.:960] Out: 400 [Urine:400]  PHYSICAL EXAM General appearance: alert, cooperative and no distress Resp: clear to auscultation bilaterally Cardio: regular rate and rhythm, S1, S2 normal, no murmur, click, rub or gallop GI: soft, non-tender; bowel sounds normal; no masses,  no organomegaly Extremities: extremities normal, atraumatic, no cyanosis or edema  Lab Results:    Basic Metabolic Panel:  Basename 09/20/12 0454 09/19/12 1255  NA 142 132*  K 3.8 4.2  CL 107 99  CO2 28 22  GLUCOSE 73 211*  BUN 4* 7  CREATININE 0.60 0.58  CALCIUM 9.2 9.0  MG -- --  PHOS -- --   Liver Function Tests:  Basename 09/18/12 1740  AST 17  ALT 15  ALKPHOS 116  BILITOT 0.3  PROT 8.1  ALBUMIN 4.4   No results found for this basename: LIPASE:2,AMYLASE:2 in the last 72 hours No results found for this basename: AMMONIA:2 in the last 72 hours CBC:  Basename 09/19/12 0434 09/18/12 1740  WBC 14.8* 18.2*  NEUTROABS -- 12.9*  HGB 10.9* 12.6*  HCT 32.9* 38.0*  MCV 88.9 88.8  PLT 296 345   Cardiac Enzymes: No results found for this basename: CKTOTAL:3,CKMB:3,CKMBINDEX:3,TROPONINI:3 in the last 72 hours BNP: No results found for this basename: PROBNP:3 in the last 72 hours D-Dimer: No results found for this basename: DDIMER:2 in the last 72 hours CBG:  Basename  09/20/12 2129 09/20/12 1944 09/20/12 1647 09/20/12 1117 09/20/12 0725 09/19/12 2058  GLUCAP 319* 306* 78 275* 86 141*   Hemoglobin A1C: No results found for this basename: HGBA1C in the last 72 hours Fasting Lipid Panel: No results found for this basename: CHOL,HDL,LDLCALC,TRIG,CHOLHDL,LDLDIRECT in the last 72 hours Thyroid Function Tests: No results found for this basename: TSH,T4TOTAL,FREET4,T3FREE,THYROIDAB in the last 72 hours Anemia Panel: No results found for this basename: VITAMINB12,FOLATE,FERRITIN,TIBC,IRON,RETICCTPCT in the last 72 hours Coagulation: No results found for this basename: LABPROT:2,INR:2 in the last 72 hours Urine Drug Screen: Drugs of Abuse     Component Value Date/Time   LABOPIA NONE DETECTED 09/19/2012 1438   COCAINSCRNUR NONE DETECTED 09/19/2012 1438   LABBENZ NONE DETECTED 09/19/2012 1438   AMPHETMU NONE DETECTED 09/19/2012 1438   THCU NONE DETECTED 09/19/2012 1438   LABBARB NONE DETECTED 09/19/2012 1438    Alcohol Level: No results found for this basename: ETH:2 in the last 72 hours Urinalysis:  Basename 09/18/12 1932  COLORURINE YELLOW  LABSPEC >1.030*  PHURINE 6.0  GLUCOSEU 500*  HGBUR NEGATIVE  BILIRUBINUR SMALL*  KETONESUR >80*  PROTEINUR NEGATIVE  UROBILINOGEN 0.2  NITRITE NEGATIVE  LEUKOCYTESUR NEGATIVE   Misc. Labs:  ABGS No results found for this basename: PHART,PCO2,PO2ART,TCO2,HCO3 in the last 72 hours CULTURES Recent Results (from the past 240 hour(s))  MRSA PCR SCREENING     Status: Normal  Collection Time   09/18/12 11:36 PM      Component Value Range Status Comment   MRSA by PCR NEGATIVE  NEGATIVE Final    Studies/Results: No results found.  Medications:  Prior to Admission:  Prescriptions prior to admission  Medication Sig Dispense Refill  . acetaminophen (TYLENOL) 325 MG tablet Take 650-975 mg by mouth 2 (two) times daily as needed. For pain      . gabapentin (NEURONTIN) 300 MG capsule Take 600 mg by mouth 2 (two)  times daily.       . insulin glargine (LANTUS) 100 UNIT/ML injection Inject 22 Units into the skin at bedtime.      . insulin lispro (HUMALOG) 100 UNIT/ML injection Inject 10-16 Units into the skin 3 (three) times daily before meals. As directed per sliding scale      . methadone (DOLOPHINE) 10 MG/ML solution Take 100-120 mg by mouth 2 (two) times daily. *Give 120mg  daily in the morning and 100mg  in the evening      . pregabalin (LYRICA) 100 MG capsule Take 100 mg by mouth 2 (two) times daily.      . simvastatin (ZOCOR) 10 MG tablet Take 10 mg by mouth at bedtime.      Marland Kitchen zolpidem (AMBIEN) 10 MG tablet Take 10 mg by mouth at bedtime.       Scheduled:   . enoxaparin (LOVENOX) injection  40 mg Subcutaneous Q24H  . [COMPLETED] gabapentin      . gabapentin  300 mg Oral BID  . insulin aspart  0-9 Units Subcutaneous TID WC & HS  . insulin aspart  10 Units Subcutaneous TID WC  . insulin glargine  24 Units Subcutaneous QHS  . [COMPLETED] methadone      . methadone  120 mg Oral Daily   And  . methadone  100 mg Oral QHS  . [COMPLETED] methadone      . [COMPLETED] pregabalin      . pregabalin  100 mg Oral BID  . simvastatin  20 mg Oral q1800  . sodium chloride  3 mL Intravenous Q12H  . [DISCONTINUED] insulin aspart  0-9 Units Subcutaneous TID WC  . [DISCONTINUED] insulin aspart  8 Units Subcutaneous TID WC  . [DISCONTINUED] insulin glargine  20 Units Subcutaneous QHS   Continuous:   . sodium chloride Stopped (09/20/12 0800)  . dextrose 5 % and 0.45% NaCl 125 mL/hr at 09/19/12 0900   ZOX:[id number], ondansetron (ZOFRAN) IV  Assesment: He is much improved and ready for discharge Principal Problem:  *Diabetic ketoacidosis Active Problems:  Diabetes mellitus type 1  Chronic pain  Hyperlipidemia    Plan: Discharge home today please see discharge summary for details    LOS: 3 days   Meshelle Holness L 09/21/2012, 9:51 AM

## 2012-09-22 LAB — GLUCOSE, CAPILLARY
Glucose-Capillary: 61 mg/dL — ABNORMAL LOW (ref 70–99)
Glucose-Capillary: 116 mg/dL — ABNORMAL HIGH (ref 70–99)
Glucose-Capillary: 142 mg/dL — ABNORMAL HIGH (ref 70–99)

## 2012-11-08 ENCOUNTER — Emergency Department (HOSPITAL_COMMUNITY): Payer: Self-pay

## 2012-11-08 ENCOUNTER — Encounter (HOSPITAL_COMMUNITY): Payer: Self-pay | Admitting: *Deleted

## 2012-11-08 ENCOUNTER — Emergency Department (HOSPITAL_COMMUNITY)
Admission: EM | Admit: 2012-11-08 | Discharge: 2012-11-08 | Discharge status: Against Medical Advice | Payer: Self-pay | Attending: Emergency Medicine | Admitting: Emergency Medicine

## 2012-11-08 ENCOUNTER — Emergency Department (HOSPITAL_COMMUNITY)
Admission: EM | Admit: 2012-11-08 | Discharge: 2012-11-08 | Disposition: A | Discharge status: Home / Self Care | Payer: Self-pay | Attending: Emergency Medicine | Admitting: Emergency Medicine

## 2012-11-08 DIAGNOSIS — R350 Frequency of micturition: Secondary | ICD-10-CM | POA: Insufficient documentation

## 2012-11-08 DIAGNOSIS — Z79899 Other long term (current) drug therapy: Secondary | ICD-10-CM | POA: Insufficient documentation

## 2012-11-08 DIAGNOSIS — E1149 Type 2 diabetes mellitus with other diabetic neurological complication: Secondary | ICD-10-CM | POA: Insufficient documentation

## 2012-11-08 DIAGNOSIS — G589 Mononeuropathy, unspecified: Secondary | ICD-10-CM | POA: Insufficient documentation

## 2012-11-08 DIAGNOSIS — Z794 Long term (current) use of insulin: Secondary | ICD-10-CM | POA: Insufficient documentation

## 2012-11-08 DIAGNOSIS — F17200 Nicotine dependence, unspecified, uncomplicated: Secondary | ICD-10-CM | POA: Insufficient documentation

## 2012-11-08 DIAGNOSIS — G8929 Other chronic pain: Secondary | ICD-10-CM | POA: Insufficient documentation

## 2012-11-08 DIAGNOSIS — R5381 Other malaise: Secondary | ICD-10-CM | POA: Insufficient documentation

## 2012-11-08 DIAGNOSIS — R6883 Chills (without fever): Secondary | ICD-10-CM | POA: Insufficient documentation

## 2012-11-08 DIAGNOSIS — R110 Nausea: Secondary | ICD-10-CM | POA: Insufficient documentation

## 2012-11-08 DIAGNOSIS — E1169 Type 2 diabetes mellitus with other specified complication: Secondary | ICD-10-CM | POA: Insufficient documentation

## 2012-11-08 DIAGNOSIS — R112 Nausea with vomiting, unspecified: Secondary | ICD-10-CM | POA: Insufficient documentation

## 2012-11-08 DIAGNOSIS — R61 Generalized hyperhidrosis: Secondary | ICD-10-CM | POA: Insufficient documentation

## 2012-11-08 DIAGNOSIS — R739 Hyperglycemia, unspecified: Secondary | ICD-10-CM

## 2012-11-08 DIAGNOSIS — E1165 Type 2 diabetes mellitus with hyperglycemia: Secondary | ICD-10-CM

## 2012-11-08 DIAGNOSIS — IMO0001 Reserved for inherently not codable concepts without codable children: Secondary | ICD-10-CM | POA: Insufficient documentation

## 2012-11-08 DIAGNOSIS — F1310 Sedative, hypnotic or anxiolytic abuse, uncomplicated: Secondary | ICD-10-CM | POA: Insufficient documentation

## 2012-11-08 DIAGNOSIS — E1142 Type 2 diabetes mellitus with diabetic polyneuropathy: Secondary | ICD-10-CM | POA: Insufficient documentation

## 2012-11-08 HISTORY — DX: Opioid dependence, uncomplicated: F11.20

## 2012-11-08 LAB — BASIC METABOLIC PANEL
BUN: 28 mg/dL — ABNORMAL HIGH (ref 6–23)
CO2: 27 mEq/L (ref 19–32)
Calcium: 9.2 mg/dL (ref 8.4–10.5)
Chloride: 87 mEq/L — ABNORMAL LOW (ref 96–112)
Creatinine, Ser: 0.79 mg/dL (ref 0.50–1.35)
GFR calc Af Amer: >90 mL/min (ref >90)
GFR calc non Af Amer: >90 mL/min (ref >90)
Glucose, Bld: 751 mg/dL (ref 70–99)
Potassium: 4.8 mEq/L (ref 3.5–5.1)
Sodium: 128 mEq/L — ABNORMAL LOW (ref 135–145)

## 2012-11-08 LAB — URINALYSIS, ROUTINE W REFLEX MICROSCOPIC
Bilirubin Urine: NEGATIVE
Glucose, UA: >1000 mg/dL — AB
Hgb urine dipstick: NEGATIVE
Ketones, ur: 15 mg/dL — AB
Leukocytes, UA: NEGATIVE
Nitrite: NEGATIVE
Protein, ur: NEGATIVE mg/dL
Specific Gravity, Urine: <1.005 — ABNORMAL LOW (ref 1.005–1.030)
Urobilinogen, UA: 0.2 mg/dL (ref 0.0–1.0)
pH: 6 (ref 5.0–8.0)

## 2012-11-08 LAB — GLUCOSE, CAPILLARY
Glucose-Capillary: >600 mg/dL (ref 70–99)
Glucose-Capillary: 592 mg/dL (ref 70–99)
Glucose-Capillary: 317 mg/dL — ABNORMAL HIGH (ref 70–99)
Glucose-Capillary: 434 mg/dL — ABNORMAL HIGH (ref 70–99)

## 2012-11-08 LAB — BLOOD GAS, VENOUS
Acid-Base Excess: 2.2 mmol/L — ABNORMAL HIGH (ref 0.0–2.0)
Bicarbonate: 27.6 mEq/L — ABNORMAL HIGH (ref 20.0–24.0)
FIO2: 21 %
O2 Saturation: 58.1 %
Patient temperature: 37
TCO2: 25.6 mmol/L (ref 0–100)
pCO2, Ven: 54.6 mmHg — ABNORMAL HIGH (ref 45.0–50.0)
pH, Ven: 7.324 — ABNORMAL HIGH (ref 7.250–7.300)
pO2, Ven: 33.6 mmHg (ref 30.0–45.0)

## 2012-11-08 LAB — CBC
HCT: 37.3 % — ABNORMAL LOW (ref 39.0–52.0)
Hemoglobin: 12.3 g/dL — ABNORMAL LOW (ref 13.0–17.0)
MCH: 29.6 pg (ref 26.0–34.0)
MCHC: 33 g/dL (ref 30.0–36.0)
MCV: 89.9 fL (ref 78.0–100.0)
Platelets: 310 10*3/uL (ref 150–400)
RBC: 4.15 MIL/uL — ABNORMAL LOW (ref 4.22–5.81)
RDW: 13.4 % (ref 11.5–15.5)
WBC: 10.1 10*3/uL (ref 4.0–10.5)

## 2012-11-08 LAB — URINE MICROSCOPIC-ADD ON: Urine-Other: NONE SEEN

## 2012-11-08 MED ORDER — ONDANSETRON HCL 4 MG/2ML IJ SOLN
4.0000 mg | Freq: Once | INTRAMUSCULAR | Status: AC
  Administered 2012-11-08: 4 mg via INTRAVENOUS
  Filled 2012-11-08: qty 2

## 2012-11-08 MED ORDER — ONDANSETRON HCL 4 MG/2ML IJ SOLN: 4.0000 mg | INTRAMUSCULAR | Status: DC | PRN

## 2012-11-08 MED ORDER — INSULIN ASPART 100 UNIT/ML ~~LOC~~ SOLN
20.0000 [IU] | Freq: Once | SUBCUTANEOUS | Status: AC
  Administered 2012-11-08: 20 [IU] via INTRAVENOUS

## 2012-11-08 MED ORDER — SODIUM CHLORIDE 0.9 % IV BOLUS (SEPSIS)
1000.0000 mL | Freq: Once | INTRAVENOUS | Status: AC
  Administered 2012-11-08: 1000 mL via INTRAVENOUS

## 2012-11-08 MED ORDER — SODIUM CHLORIDE 0.9 % IV SOLN: INTRAVENOUS | Status: DC

## 2012-11-08 MED ORDER — HYDROMORPHONE HCL PF 1 MG/ML IJ SOLN
1.0000 mg | Freq: Once | INTRAMUSCULAR | Status: AC
  Administered 2012-11-08: 1 mg via INTRAVENOUS
  Filled 2012-11-08: qty 1

## 2012-11-08 MED ORDER — INSULIN ASPART 100 UNIT/ML ~~LOC~~ SOLN
15.0000 [IU] | Freq: Once | SUBCUTANEOUS | Status: AC
  Administered 2012-11-08: 15 [IU] via INTRAVENOUS
  Filled 2012-11-08: qty 1

## 2012-11-08 MED ORDER — FENTANYL CITRATE 0.05 MG/ML IJ SOLN: 50.0000 ug | INTRAMUSCULAR | Status: DC | PRN

## 2012-11-08 MED ORDER — SODIUM CHLORIDE 0.9 % IV BOLUS (SEPSIS): 1000.0000 mL | Freq: Once | INTRAVENOUS | Status: DC

## 2012-11-08 NOTE — ED Notes (Signed)
Pt not happy with care. States he will be back because none of the doctors he goes to will not help him, Pt inst to follow discharge inst and if he does not get any better or is worse he can return to the ED. Pt staggering around in room

## 2012-11-08 NOTE — ED Notes (Signed)
Pt CBG read 317. Pt states he hurts all over and needs something for pain. RN made aware.

## 2012-11-08 NOTE — ED Notes (Signed)
Lab called critical glucose of 751. MD aware

## 2012-11-08 NOTE — ED Provider Notes (Signed)
History  This chart was scribed for Raeford Razor, MD by Shari Heritage, ED Scribe. The patient was seen in room APA10/APA10. Patient's care was started at 0716.  CSN: [id number]  Arrival date & time 11/08/12  0706   First MD Initiated Contact with Patient 11/08/12 [phone]      Chief Complaint  Patient presents with  . Hyperglycemia    The history is provided by the patient. No language interpreter was used.    HPI Comments: Tyler Mann is a 31 y.o. male diabetes and neuropathy who presents to the Emergency Department complaining of hyperglycemia onset 4 days ago. There is associated intermittent nausea, moderate upper extremity pain (arms and shoulders bilaterally), increased urinary frequency, thirst, chills and diaphoresis. Patient takes humalog, lantus daily to control blood sugar. Patient says his lantus dosage was recently increased to 22u per day.. Patient also states that he has been taking methadone 220 mg daily as part of rehab for narcotic addiction and self-reports decreased grip strength in his hands while taking methadone.   PCP - Dwana Melena  Past Medical History  Diagnosis Date  . Diabetes mellitus   . Neuropathy   . Narcotic addiction     History reviewed. No pertinent past surgical history.  No family history on file.  History  Substance Use Topics  . Smoking status: Current Every Day Smoker -- 0.5 packs/day for 17 years    Types: Cigarettes  . Smokeless tobacco: Not on file  . Alcohol Use: No      Review of Systems  Constitutional: Positive for chills and diaphoresis.  Gastrointestinal: Positive for nausea.  Genitourinary: Positive for frequency.  Musculoskeletal: Positive for myalgias.  All other systems reviewed and are negative.    Allergies  Review of patient's allergies indicates no known allergies.  Home Medications   Current Outpatient Rx  Name  Route  Sig  Dispense  Refill  . ACETAMINOPHEN 325 MG PO TABS   Oral   Take 650-975 mg by  mouth 2 (two) times daily as needed. For pain         . GABAPENTIN 300 MG PO CAPS   Oral   Take 600 mg by mouth 2 (two) times daily.          . INSULIN GLARGINE 100 UNIT/ML [place] SOLN   Subcutaneous   Inject 22 Units into the skin at bedtime.         . INSULIN LISPRO (HUMAN) 100 UNIT/ML [place] SOLN   Subcutaneous   Inject 10-16 Units into the skin 3 (three) times daily before meals. As directed per sliding scale         . METHADONE HCL 10 MG/ML PO CONC   Oral   Take 100-120 mg by mouth 2 (two) times daily. *Give 120mg  daily in the morning and 100mg  in the evening         . PREGABALIN 100 MG PO CAPS   Oral   Take 100 mg by mouth 2 (two) times daily.         Marland Kitchen SIMVASTATIN 10 MG PO TABS   Oral   Take 10 mg by mouth at bedtime.         Marland Kitchen ZOLPIDEM TARTRATE 10 MG PO TABS   Oral   Take 10 mg by mouth at bedtime.           Triage Vitals: BP 132/80  Pulse 82  Temp 97.5 F (36.4 C) (Oral)  Resp 16  SpO2 95%  Physical Exam  Nursing note and vitals reviewed. Constitutional: He appears well-developed and well-nourished. No distress.       Tired appearing, but NAD.   HENT:  Head: Normocephalic and atraumatic.  Eyes: Conjunctivae normal are normal. Right eye exhibits no discharge. Left eye exhibits no discharge.  Neck: Neck supple.  Cardiovascular: Normal rate, regular rhythm and normal heart sounds.  Exam reveals no gallop and no friction rub.   No murmur heard. Pulmonary/Chest: Effort normal and breath sounds normal. No respiratory distress.       No increased work of breathing.  Abdominal: Soft. He exhibits no distension. There is no tenderness.  Musculoskeletal: He exhibits no edema and no tenderness.  Neurological: He is alert.  Skin: Skin is warm and dry.  Psychiatric: He has a normal mood and affect. His behavior is normal. Thought content normal.    ED Course  Procedures (including critical care time) DIAGNOSTIC STUDIES: Oxygen Saturation is 95% on room  air, adequate by my interpretation.    COORDINATION OF CARE: 7:30 AM- Patient informed of current plan for treatment and evaluation and agrees with plan at this time.   Results for orders placed during the hospital encounter of 11/08/12  GLUCOSE, CAPILLARY      Component Value Range   Glucose-Capillary >600 (*) 70 - 99 mg/dL  BLOOD GAS, VENOUS      Component Value Range   FIO2 21.00     pH, Ven 7.324 (*) 7.250 - 7.300   pCO2, Ven 54.6 (*) 45.0 - 50.0 mmHg   pO2, Ven 33.6  30.0 - 45.0 mmHg   Bicarbonate 27.6 (*) 20.0 - 24.0 mEq/L   TCO2 25.6  0 - 100 mmol/L   Acid-Base Excess 2.2 (*) 0.0 - 2.0 mmol/L   O2 Saturation 58.1     Patient temperature 37.0     Drawn by VEIN     Sample type VEIN    BASIC METABOLIC PANEL      Component Value Range   Sodium 128 (*) 135 - 145 mEq/L   Potassium 4.8  3.5 - 5.1 mEq/L   Chloride 87 (*) 96 - 112 mEq/L   CO2 27  19 - 32 mEq/L   Glucose, Bld 751 (*) 70 - 99 mg/dL   BUN 28 (*) 6 - 23 mg/dL   Creatinine, Ser 4.09  0.50 - 1.35 mg/dL   Calcium 9.2  8.4 - 81.1 mg/dL   GFR calc non Af Amer >90  >90 mL/min   GFR calc Af Amer >90  >90 mL/min  CBC      Component Value Range   WBC 10.1  4.0 - 10.5 K/uL   RBC 4.15 (*) 4.22 - 5.81 MIL/uL   Hemoglobin 12.3 (*) 13.0 - 17.0 g/dL   HCT 91.4 (*) 78.2 - 95.6 %   MCV 89.9  78.0 - 100.0 fL   MCH 29.6  26.0 - 34.0 pg   MCHC 33.0  30.0 - 36.0 g/dL   RDW 21.3  08.6 - 57.8 %   Platelets 310  150 - 400 K/uL  URINALYSIS, ROUTINE W REFLEX MICROSCOPIC      Component Value Range   Color, Urine YELLOW  YELLOW   APPearance CLEAR  CLEAR   Specific Gravity, Urine <1.005 (*) 1.005 - 1.030   pH 6.0  5.0 - 8.0   Glucose, UA >1000 (*) NEGATIVE mg/dL   Hgb urine dipstick NEGATIVE  NEGATIVE   Bilirubin Urine NEGATIVE  NEGATIVE   Ketones,  ur 15 (*) NEGATIVE mg/dL   Protein, ur NEGATIVE  NEGATIVE mg/dL   Urobilinogen, UA 0.2  0.0 - 1.0 mg/dL   Nitrite NEGATIVE  NEGATIVE   Leukocytes, UA NEGATIVE  NEGATIVE  URINE  MICROSCOPIC-ADD ON      Component Value Range   Urine-Other       Value: NO FORMED ELEMENTS SEEN ON URINE MICROSCOPIC EXAMINATION  GLUCOSE, CAPILLARY      Component Value Range   Glucose-Capillary 592 (*) 70 - 99 mg/dL   Comment 1 Documented in Chart     Comment 2 Notify RN    GLUCOSE, CAPILLARY      Component Value Range   Glucose-Capillary 317 (*) 70 - 99 mg/dL   Comment 1 Documented in Chart     Comment 2 Notify RN      No results found.   1. Poorly controlled diabetes mellitus   2. Chronic pain       MDM  31yM with generalized pain and hyperglycemia. Significant hyperglycemia, but bicarb normal. Anion gap 14. HD stable. Glucose improved with IVF and insulin. Instructed to increase lantus to 24u and continue SS. Discussed needs to FU with his endocrinologist as soon as he can.      I personally preformed the services scribed in my presence. The recorded information has been reviewed and is correct. Raeford Razor, MD.    Raeford Razor, MD 11/08/12 [phone]

## 2012-11-08 NOTE — ED Notes (Signed)
C/o generalized pain to shoulders, legs, and difficulty ambulating x 3-4 days.  States blood sugar has increased over last couple of days.

## 2012-11-08 NOTE — ED Notes (Signed)
MD advised of critical blood gas values

## 2012-11-08 NOTE — ED Notes (Signed)
Pt walking out of ED. Called name and he ignored [place] and walked out. Family that was with pt stated the edp told him she was going to do chest xray and urine so pt left.

## 2012-11-08 NOTE — ED Notes (Signed)
Seen here this morning for same.  Reports has been vomiting since discharge and is not feeling better.

## 2012-11-08 NOTE — ED Provider Notes (Signed)
History     CSN: [id number]  Arrival date & time 11/08/12  1539   First MD Initiated Contact with Patient 11/08/12 1601      Chief Complaint  Patient presents with  . Hyperglycemia     HPI Pt was seen at 1620.   Per pt, c/o gradual onset and persistence of constant generalized weakness and chronic pain for the past "20 years."  Pt states he is "so weak" he cannot take care of himself at home alone and he lives with his parents. States he "can't take the cap off a toothpaste tube" because of his generalized weakness and chronic pain issues that have been ongoing for many many years. States "no doctor has been able to help me."  States he did not take his methadone today due to N/V and feels his CBG's "are high again," so he came back to the ED for "some pain meds and to get admitted."  Denies CP/SOB, no abd pain, no diarrhea, no black or blood in stools or emesis, no fevers, no rash.     Past Medical History  Diagnosis Date  . Diabetes mellitus   . Neuropathy   . Narcotic addiction     History reviewed. No pertinent past surgical history.   History  Substance Use Topics  . Smoking status: Current Every Day Smoker -- 0.5 packs/day for 17 years    Types: Cigarettes  . Smokeless tobacco: Not on file  . Alcohol Use: No     Review of Systems ROS: Statement: All systems negative except as marked or noted in the HPI; Constitutional: +chronic generalized pain. Negative for fever and chills. ; ; Eyes: Negative for eye pain, redness and discharge. ; ; ENMT: Negative for ear pain, hoarseness, nasal congestion, sinus pressure and sore throat. ; ; Cardiovascular: Negative for chest pain, palpitations, diaphoresis, dyspnea and peripheral edema. ; ; Respiratory: Negative for cough, wheezing and stridor. ; ; Gastrointestinal: +N/V. Negative for diarrhea, abdominal pain, blood in stool, hematemesis, jaundice and rectal bleeding. . ; ; Genitourinary: Negative for dysuria, flank pain and  hematuria. ; ; Musculoskeletal: Negative for back pain and neck pain. Negative for swelling and trauma.; ; Skin: Negative for pruritus, rash, abrasions, blisters, bruising and skin lesion.; ; Neuro: +generalized weakness. Negative for headache, lightheadedness and neck stiffness. Negative for altered level of consciousness , altered mental status, extremity weakness, paresthesias, involuntary movement, seizure and syncope.     Allergies  Review of patient's allergies indicates no known allergies.  Home Medications   Current Outpatient Rx  Name  Route  Sig  Dispense  Refill  . INSULIN DETEMIR 100 UNIT/ML [place] SOLN   Subcutaneous   Inject 24 Units into the skin at bedtime.          . INSULIN LISPRO (HUMAN) 100 UNIT/ML [place] SOLN   Subcutaneous   Inject 3-15 Units into the skin 3 (three) times daily before meals. Take 3 units if BG < 70, Take 6 units for 71-149, Take 10 units for BG > 150, then increase by 1 unit for every 50 units >150.         Marland Kitchen METHADONE HCL 10 MG/ML PO CONC   Oral   Take 100-120 mg by mouth 2 (two) times daily. *Give 120mg  daily in the morning and 100mg  in the evening           BP 118/75  Pulse 92  Temp 97.9 F (36.6 C) (Oral)  Resp 16  SpO2 96%  Physical Exam 1625: Physical examination:  Nursing notes reviewed; Vital signs and O2 SAT reviewed;  Constitutional: Thin, Well hydrated, In no acute distress; Head:  Normocephalic, atraumatic; Eyes: EOMI, PERRL, No scleral icterus; ENMT: Mouth and pharynx normal, Mucous membranes moist; Neck: Supple, Full range of motion, No lymphadenopathy; Cardiovascular: Regular rate and rhythm, No gallop; Respiratory: Breath sounds clear & equal bilaterally, No rales, rhonchi, wheezes.  Speaking full sentences with ease, Normal respiratory effort/excursion; Chest: Nontender, Movement normal; Abdomen: Soft, Nontender, Nondistended, Normal bowel sounds;; Extremities: Pulses normal, No tenderness, No edema, No calf edema or asymmetry.;  Neuro: AA&Ox3, Major CN grossly intact.  Speech clear. No gross focal motor or sensory deficits in extremities.; Skin: Color normal, Warm, Dry.; Psych:  Easily agitated and arguementative.    ED Course  Procedures     MDM  MDM Reviewed: previous chart, nursing note and vitals Reviewed previous: labs and x-ray      1645:  Apparently after my exam pt climbed off the stretcher by himself, got himself dressed, and walked out of the ED. Had no N/V while in the ED. Pt's family friend and mother in the hallway, tearful.  States he "just got mad again and left," and "he gets like that a lot." They further describe pt as easily agitated, frequently cursing and arguing with his parents. Family and friend have made an appt at social services after the holidays to try to help him in that regard (financial, medical programs, etc).  I encouraged family and pt's friend to have pt stay for a work up, but pt continues to refuse and won't get out of the car in the parking lot.  Pt makes his own medical decisions.  Risks of AMA explained to pt's friend and family, including, but not limited to:  stroke, heart attack, cardiac arrythmia ("irregular heart rate/beat"), "passing out," temporary and/or permanent disability, death.  Pt's friend and family verb understanding, and relay this to pt in the parking lot.  He continues to refuse any further ED evaluation, apparently understanding the consequences of his decision.  I encouraged them to have pt to follow up with his PMD and Pain Management MD on Monday and return to the ED immediately if symptoms return, or for any other concerns.  Pt's friend and family verb understanding, agreeable.  Pt's friend and family given resource list as well as information on signing up for insurance under the new federal marketplace.  Pt's friend and family both voiced appreciation and thanks for my and the ED staff's efforts to help them.          Laray Anger, DO 11/10/12  [phone]

## 2012-11-08 NOTE — ED Notes (Signed)
Pt CBG read 592.

## 2012-11-22 ENCOUNTER — Encounter (HOSPITAL_COMMUNITY): Payer: Self-pay | Admitting: *Deleted

## 2012-11-22 ENCOUNTER — Inpatient Hospital Stay (HOSPITAL_COMMUNITY)
Admission: EM | Admit: 2012-11-22 | Discharge: 2012-11-24 | DRG: 639 | Disposition: A | Discharge status: Home / Self Care | Payer: Self-pay | Attending: Internal Medicine | Admitting: Internal Medicine

## 2012-11-22 ENCOUNTER — Emergency Department (HOSPITAL_COMMUNITY): Payer: Self-pay

## 2012-11-22 DIAGNOSIS — Z794 Long term (current) use of insulin: Secondary | ICD-10-CM

## 2012-11-22 DIAGNOSIS — E1010 Type 1 diabetes mellitus with ketoacidosis without coma: Principal | ICD-10-CM

## 2012-11-22 DIAGNOSIS — F17200 Nicotine dependence, unspecified, uncomplicated: Secondary | ICD-10-CM | POA: Diagnosis present

## 2012-11-22 DIAGNOSIS — E1121 Type 2 diabetes mellitus with diabetic nephropathy: Secondary | ICD-10-CM | POA: Diagnosis present

## 2012-11-22 DIAGNOSIS — E109 Type 1 diabetes mellitus without complications: Secondary | ICD-10-CM

## 2012-11-22 DIAGNOSIS — E1029 Type 1 diabetes mellitus with other diabetic kidney complication: Secondary | ICD-10-CM | POA: Diagnosis present

## 2012-11-22 DIAGNOSIS — F1120 Opioid dependence, uncomplicated: Secondary | ICD-10-CM

## 2012-11-22 DIAGNOSIS — Z720 Tobacco use: Secondary | ICD-10-CM

## 2012-11-22 DIAGNOSIS — N058 Unspecified nephritic syndrome with other morphologic changes: Secondary | ICD-10-CM | POA: Diagnosis present

## 2012-11-22 DIAGNOSIS — E1110 Type 2 diabetes mellitus with ketoacidosis without coma: Secondary | ICD-10-CM

## 2012-11-22 DIAGNOSIS — G8929 Other chronic pain: Secondary | ICD-10-CM

## 2012-11-22 DIAGNOSIS — Z23 Encounter for immunization: Secondary | ICD-10-CM

## 2012-11-22 DIAGNOSIS — E1065 Type 1 diabetes mellitus with hyperglycemia: Secondary | ICD-10-CM | POA: Diagnosis present

## 2012-11-22 LAB — CBC WITH DIFFERENTIAL/PLATELET
Hemoglobin: 12.7 g/dL — ABNORMAL LOW (ref 13.0–17.0)
Lymphocytes Relative: 20 % (ref 12–46)
Lymphs Abs: 3.4 10*3/uL (ref 0.7–4.0)
MCH: 29.1 pg (ref 26.0–34.0)
Monocytes Relative: 5 % (ref 3–12)
Neutro Abs: 12.9 10*3/uL — ABNORMAL HIGH (ref 1.7–7.7)
Neutrophils Relative %: 75 % (ref 43–77)
RBC: 4.36 MIL/uL (ref 4.22–5.81)

## 2012-11-22 LAB — BASIC METABOLIC PANEL
BUN: 17 mg/dL (ref 6–23)
BUN: 13 mg/dL (ref 6–23)
CO2: 16 mEq/L — ABNORMAL LOW (ref 19–32)
Chloride: 85 mEq/L — ABNORMAL LOW (ref 96–112)
Creatinine, Ser: 0.86 mg/dL (ref 0.50–1.35)
GFR calc non Af Amer: >90 mL/min (ref >90)
Glucose, Bld: 581 mg/dL (ref 70–99)
Glucose, Bld: 132 mg/dL — ABNORMAL HIGH (ref 70–99)
Potassium: 4.6 mEq/L (ref 3.5–5.1)
Potassium: 4 mEq/L (ref 3.5–5.1)

## 2012-11-22 LAB — GLUCOSE, CAPILLARY
Glucose-Capillary: 513 mg/dL — ABNORMAL HIGH (ref 70–99)
Glucose-Capillary: 251 mg/dL — ABNORMAL HIGH (ref 70–99)
Glucose-Capillary: 120 mg/dL — ABNORMAL HIGH (ref 70–99)
Glucose-Capillary: 125 mg/dL — ABNORMAL HIGH (ref 70–99)

## 2012-11-22 LAB — URINALYSIS, ROUTINE W REFLEX MICROSCOPIC
Bilirubin Urine: NEGATIVE
Hgb urine dipstick: NEGATIVE
Specific Gravity, Urine: 1.02 (ref 1.005–1.030)
pH: 5.5 (ref 5.0–8.0)

## 2012-11-22 LAB — URINE MICROSCOPIC-ADD ON

## 2012-11-22 LAB — INFLUENZA PANEL BY PCR (TYPE A & B)
Influenza A By PCR: NEGATIVE
Influenza B By PCR: NEGATIVE

## 2012-11-22 LAB — MRSA PCR SCREENING: MRSA by PCR: NEGATIVE

## 2012-11-22 LAB — KETONES, QUALITATIVE

## 2012-11-22 MED ORDER — ONDANSETRON HCL 4 MG/2ML IJ SOLN
4.0000 mg | Freq: Once | INTRAMUSCULAR | Status: AC
  Administered 2012-11-22: 4 mg via INTRAVENOUS

## 2012-11-22 MED ORDER — ENOXAPARIN SODIUM 40 MG/0.4ML ~~LOC~~ SOLN
40.0000 mg | SUBCUTANEOUS | Status: DC
  Administered 2012-11-22 – 2012-11-23 (×2): 40 mg via SUBCUTANEOUS
  Filled 2012-11-22 (×2): qty 0.4

## 2012-11-22 MED ORDER — SODIUM CHLORIDE 0.9 % IV BOLUS (SEPSIS)
1000.0000 mL | Freq: Once | INTRAVENOUS | Status: AC
  Administered 2012-11-22: 1000 mL via INTRAVENOUS

## 2012-11-22 MED ORDER — POTASSIUM CHLORIDE 10 MEQ/100ML IV SOLN
10.0000 meq | INTRAVENOUS | Status: AC
  Administered 2012-11-22: 10 meq via INTRAVENOUS
  Filled 2012-11-22: qty 200

## 2012-11-22 MED ORDER — SODIUM CHLORIDE 0.9 % IV SOLN: INTRAVENOUS | Status: DC

## 2012-11-22 MED ORDER — ONDANSETRON HCL 4 MG/2ML IJ SOLN: 4.0000 mg | INTRAMUSCULAR | Status: DC | PRN

## 2012-11-22 MED ORDER — DEXTROSE-NACL 5-0.45 % IV SOLN
INTRAVENOUS | Status: DC
  Administered 2012-11-22: 1000 mL via INTRAVENOUS
  Administered 2012-11-23: 12:00:00 via INTRAVENOUS
  Administered 2012-11-23: 1000 mL via INTRAVENOUS

## 2012-11-22 MED ORDER — DEXTROSE 50 % IV SOLN: 25.0000 mL | INTRAVENOUS | Status: DC | PRN

## 2012-11-22 MED ORDER — ONDANSETRON HCL 4 MG/2ML IJ SOLN
INTRAMUSCULAR | Status: AC
  Administered 2012-11-22: 4 mg
  Filled 2012-11-22: qty 2

## 2012-11-22 MED ORDER — ONDANSETRON HCL 4 MG/2ML IJ SOLN
INTRAMUSCULAR | Status: AC
  Administered 2012-11-22: 4 mg via INTRAVENOUS
  Filled 2012-11-22: qty 2

## 2012-11-22 MED ORDER — SODIUM CHLORIDE 0.9 % IV SOLN
Freq: Once | INTRAVENOUS | Status: AC
  Administered 2012-11-22: 12:00:00 via INTRAVENOUS

## 2012-11-22 MED ORDER — LORAZEPAM 0.5 MG PO TABS
1.0000 mg | ORAL_TABLET | Freq: Four times a day (QID) | ORAL | Status: DC | PRN
  Administered 2012-11-22 – 2012-11-23 (×3): 1 mg via ORAL
  Filled 2012-11-22 (×3): qty 2

## 2012-11-22 MED ORDER — ACETAMINOPHEN 325 MG PO TABS: 650.0000 mg | ORAL_TABLET | Freq: Four times a day (QID) | ORAL | Status: DC | PRN

## 2012-11-22 MED ORDER — INSULIN REGULAR HUMAN 100 UNIT/ML IJ SOLN
INTRAMUSCULAR | Status: DC
  Administered 2012-11-22: 15:00:00 via INTRAVENOUS
  Filled 2012-11-22 (×2): qty 1

## 2012-11-22 MED ORDER — ONDANSETRON HCL 4 MG/2ML IJ SOLN: 4.0000 mg | Freq: Three times a day (TID) | INTRAMUSCULAR | Status: DC | PRN

## 2012-11-22 MED ORDER — HYDROMORPHONE HCL PF 1 MG/ML IJ SOLN
INTRAMUSCULAR | Status: AC
  Administered 2012-11-22: 1 mg
  Filled 2012-11-22: qty 1

## 2012-11-22 NOTE — ED Provider Notes (Signed)
History  This chart was scribed for Benny Lennert, MD by Bennett Scrape, ED Scribe. This patient was seen in room APA09/APA09 and the patient's care was started at 11:50 AM.  CSN: [id number]  Arrival date & time 11/22/12  1120   First MD Initiated Contact with Patient 08/18/12 1150    Chief Complaint  Patient presents with  . Hyperglycemia    The history is provided by the patient. No language interpreter was used.    Tyler Mann is a 32 y.o. male with a h/o DM who presents to the Emergency Department complaining of 3 days of gradual onset, gradually worsening, constant hyperglycemia of 600 with associated non-bloody emesis that started this morning and decreased appetite. He reports that he has been taking his insulin as prescribed without improvement. He states that he took 70 to 80 units of insulin this morning and had a CBG level of 65. He reports that he took a large dose, because he was tired of his glucose levels being so high. He denies fever, chills, cough, and diarrhea as associated symptoms. He also has a h/o neuropathy and narcotic addiction and is a current everyday smoker but denies alcohol use.   Past Medical History  Diagnosis Date  . Diabetes mellitus   . Neuropathy   . Narcotic addiction     No past surgical history on file.  No family history on file.  History  Substance Use Topics  . Smoking status: Current Every Day Smoker -- 0.5 packs/day for 17 years    Types: Cigarettes  . Smokeless tobacco: Not on file  . Alcohol Use: No      Review of Systems  Constitutional: Positive for appetite change (decreased). Negative for fever, chills and fatigue.  HENT: Negative for congestion, sinus pressure and ear discharge.   Eyes: Negative for discharge.  Respiratory: Negative for cough.   Cardiovascular: Negative for chest pain.  Gastrointestinal: Positive for vomiting. Negative for abdominal pain and diarrhea.  Genitourinary: Negative for frequency and  hematuria.  Musculoskeletal: Negative for back pain.  Skin: Negative for rash.  Neurological: Negative for seizures and headaches.  Hematological: Negative.   Psychiatric/Behavioral: Negative for hallucinations.    Allergies  Review of patient's allergies indicates no known allergies.  Home Medications   Current Outpatient Rx  Name  Route  Sig  Dispense  Refill  . INSULIN DETEMIR 100 UNIT/ML [place] SOLN   Subcutaneous   Inject 24 Units into the skin at bedtime.          . INSULIN LISPRO (HUMAN) 100 UNIT/ML [place] SOLN   Subcutaneous   Inject 3-15 Units into the skin 3 (three) times daily before meals. Take 3 units if BG < 70, Take 6 units for 71-149, Take 10 units for BG > 150, then increase by 1 unit for every 50 units >150.         Marland Kitchen METHADONE HCL 10 MG/ML PO CONC   Oral   Take 100-120 mg by mouth 2 (two) times daily. *Give 120mg  daily in the morning and 100mg  in the evening           Triage Vitals: BP 152/85  Pulse 101  Temp 99.6 F (37.6 C) (Oral)  Resp 16  Ht 6' (1.829 m)  Wt 160 lb (72.576 kg)  BMI 21.70 kg/m2  SpO2 100%  Physical Exam  Nursing note and vitals reviewed. Constitutional: He is oriented to person, place, and time. He appears well-developed and well-nourished.  HENT:  Head: Normocephalic and atraumatic.       Dry mucous membranes  Eyes: Conjunctivae normal and EOM are normal. No scleral icterus.  Neck: Neck supple. No thyromegaly present.  Cardiovascular: Regular rhythm.  Tachycardia present.  Exam reveals no gallop and no friction rub.   No murmur heard. Pulmonary/Chest: Effort normal and breath sounds normal. No stridor. He has no wheezes. He has no rales. He exhibits no tenderness.  Abdominal: Soft. He exhibits no distension. There is no tenderness. There is no rebound.  Musculoskeletal: Normal range of motion. He exhibits no edema.  Lymphadenopathy:    He has no cervical adenopathy.  Neurological: He is alert and oriented to person, place,  and time. Coordination normal.  Skin: Skin is warm and dry. No rash noted. No erythema.  Psychiatric: He has a normal mood and affect. His behavior is normal.    ED Course  Procedures (including critical care time)  DIAGNOSTIC STUDIES: Oxygen Saturation is 100% on room air, normal by my interpretation.    COORDINATION OF CARE: 11:55 AM-Discussed treatment plan which includes CXR, UA and CBC panel with pt at bedside and pt agreed to plan.  12:00 PM- Ordered 1,000 mL of Bolus  Labs Reviewed  GLUCOSE, CAPILLARY - Abnormal; Notable for the following:    Glucose-Capillary >600 (*)     All other components within normal limits  CBC WITH DIFFERENTIAL  BASIC METABOLIC PANEL   No results found.   No diagnosis found.   CRITICAL CARE Performed by: Tvisha Schwoerer L   Total critical care time: 40  Critical care time was exclusive of separately billable procedures and treating other patients.  Critical care was necessary to treat or prevent imminent or life-threatening deterioration.  Critical care was time spent personally by me on the following activities: development of treatment plan with patient and/or surrogate as well as nursing, discussions with consultants, evaluation of patient's response to treatment, examination of patient, obtaining history from patient or surrogate, ordering and performing treatments and interventions, ordering and review of laboratory studies, ordering and review of radiographic studies, pulse oximetry and re-evaluation of patient's condition.  MDM     The chart was scribed for me under my direct supervision.  I personally performed the history, physical, and medical decision making and all procedures in the evaluation of this patient.Benny Lennert, MD 11/22/12 [phone]

## 2012-11-22 NOTE — ED Notes (Signed)
Pt states his glucose levels have been reading high x 3 days. States he took 70-80 units of insulin and it was 65 this morning. States he took so much insulin because he was tired of it being so high. States he is spilling ketones. Vomiting began this morning.

## 2012-11-22 NOTE — ED Notes (Signed)
edp in with pt at this time  

## 2012-11-22 NOTE — H&P (Signed)
Hospital Admission Note Date: 11/22/2012  Patient name: Tyler Mann Medical record number: [id number] Date of birth: July 17, 1981 Age: 32 y.o. Gender: male PCP: Dwana Melena, MD  Attending physician: Christiane Ha, MD  Chief Complaint: Vomiting and high blood sugars  History of Present Illness:  Tyler Mann is an 32 y.o. male type I diabetic who presented with vomiting and hyperglycemia. He has been giving himself extra lispro insulin but remains hyperglycemic. He used to see Dr. Juanetta Gosling for his primary care needs, but has switched to Dr. Shelva Majestic as his primary care provider. He noticed his blood sugars have been running higher than usual when he tried to quit methadone maintenance cold [place]. He has a history of IV heroin use, and has been on methadone for a year. He was tired of taking it and felt that it was making his manual dexterity worse. He's had no hematemesis. He has a chronic smoker's cough which is unchanged. No fevers or chills. No dysuria. No diarrhea. He received some Dilaudid in the emergency room, but would like to avoid any further. He reports that if he continues Zofran, he should be okay. He believes he has essentially detoxed from opiates. In the emergency room, he was noted to have moderate acetone, increased anion gap acidosis, and a blood glucose of greater than 600. He is also followed by Dr. Fransico Him. He reports that he call Dr. Fransico Him who recommended he come to the emergency room.  Past Medical History  Diagnosis Date  . Diabetes mellitus   . Neuropathy   . Narcotic addiction     Meds: Prescriptions prior to admission  Medication Sig Dispense Refill  . insulin detemir (LEVEMIR) 100 UNIT/ML injection Inject 22 Units into the skin at bedtime.       . insulin lispro (HUMALOG) 100 UNIT/ML injection Inject 1-16 Units into the skin 3 (three) times daily before meals. Take 1 units if BG < 100, then increase by 1 unit for every 50 units >150. If BG > 400, take 16  units and call MD        Allergies: Review of patient's allergies indicates no known allergies. History   Social History  . Marital Status: Single    Spouse Name: N/A    Number of Children: N/A  . Years of Education: N/A   Occupational History    unemployed    Social History Main Topics  . Smoking status: Current Every Day Smoker -- 0.5 packs/day for 17 years    Types: Cigarettes  . Smokeless tobacco: Not on file  . Alcohol Use: No  . Drug Use: No     Comment: recovering from heroin  . Sexually Active:    Other Topics Concern  . Not on file   Social History Narrative  . No narrative on file   Family history: Mother has multiple sclerosis  No past surgical history on file.  Review of Systems: Systems reviewed and as per HPI, otherwise negative.  Physical Exam: Blood pressure 140/66, pulse 100, temperature 98.3 F (36.8 C), temperature source Oral, resp. rate 26, height 5\' 11"  (1.803 m), weight 70 kg (154 lb 5.2 oz), SpO2 100.00%. BP 140/66  Pulse 100  Temp 98.5 F (36.9 C) (Oral)  Resp 26  Ht 5\' 11"  (1.803 m)  Wt 70 kg (154 lb 5.2 oz)  BMI 21.52 kg/m2  SpO2 100%  General Appearance:    alert, talkative, nontoxic appearing.   Head:    Normocephalic, without obvious abnormality,  atraumatic  Eyes:    PERRL, conjunctiva/corneas clear, EOM's intact, fundi    benign, both eyes       Ears:    Normal TM's and external ear canals, both ears  Nose:   Nares normal, septum midline, mucosa normal, no drainage    or sinus tenderness  Throat:   Lips, mucosa, and tongue normal; teeth and gums normal  Neck:   Supple, symmetrical, trachea midline, no adenopathy;       thyroid:  No enlargement/tenderness/nodules; no carotid   bruit or JVD  Back:     Symmetric, no curvature, ROM normal, no CVA tenderness  Lungs:     Clear to auscultation bilaterally, respirations unlabored  Chest wall:    No tenderness or deformity  Heart:    Regular rate and rhythm, S1 and S2 normal, no  murmur, rub   or gallop  Abdomen:     Soft, non-tender, bowel sounds active all four quadrants,    no masses, no organomegaly  Genitalia:   deferred   Rectal:   deferred   Extremities:   Extremities normal, atraumatic, no cyanosis or edema  Pulses:   2+ and symmetric all extremities  Skin:   Skin color, texture, turgor normal, no rashes or lesions  Lymph nodes:   Cervical, supraclavicular, and axillary nodes normal  Neurologic:   CNII-XII intact. Normal strength, sensation and reflexes      throughout    Psychiatric: Normal affect. Calm and cooperative  Lab results: Basic Metabolic Panel:  Basename 11/22/12 1146  NA 128*  K 4.6  CL 85*  CO2 16*  GLUCOSE 581*  BUN 17  CREATININE 1.00  CALCIUM 9.8  MG --  PHOS --   Liver Function Tests: No results found for this basename: AST:2,ALT:2,ALKPHOS:2,BILITOT:2,PROT:2,ALBUMIN:2 in the last 72 hours No results found for this basename: LIPASE:2,AMYLASE:2 in the last 72 hours No results found for this basename: AMMONIA:2 in the last 72 hours CBC:  Basename 11/22/12 1146  WBC 17.3*  NEUTROABS 12.9*  HGB 12.7*  HCT 38.9*  MCV 89.2  PLT 395  CBG:  Basename 11/22/12 1513 11/22/12 1430 11/22/12 1132  GLUCAP 502* 513* >600*   Urinalysis:  Basename 11/22/12 1215  COLORURINE STRAW*  LABSPEC 1.020  PHURINE 5.5  GLUCOSEU >1000*  HGBUR NEGATIVE  BILIRUBINUR NEGATIVE  KETONESUR >80*  PROTEINUR NEGATIVE  UROBILINOGEN 0.2  NITRITE NEGATIVE  LEUKOCYTESUR NEGATIVE   Imaging results:  Dg Chest Port 1 View  11/22/2012  *RADIOLOGY REPORT*  Clinical Data: Weakness  PORTABLE CHEST - 1 VIEW  Comparison: 07/26/2010  Findings: Cardiomediastinal silhouette is within normal limits. The lungs are clear. No pleural effusion.  No pneumothorax.  No acute osseous abnormality.  Cardiac leads obscure detail.  IMPRESSION: No acute cardiopulmonary process.   Original Report Authenticated By: Christiana Pellant, M.D.     Assessment & Plan: Principal  Problem:  *DKA, type 1 Active Problems:  Opiate dependence  Tobacco abuse  Diabetic nephropathy  The patient has received a fluid bolus in the emergency room. He has been started on an insulin drip. We'll continue this. Serial basic metabolic panels. Step down monitoring while on insulin drip. Patient wishes to avoid further opiates. Patient had been on Neurontin previously but I do not see this listed on his home medication list as prepared by the pharmacy tech. Counseled to quit smoking.  Tyler Mann L 11/22/2012, 3:42 PM

## 2012-11-22 NOTE — ED Notes (Signed)
Beeped to [phone].Tyler Mann

## 2012-11-22 NOTE — ED Notes (Signed)
CRITICAL VALUE ALERT  Critical value received:  bs 581  Date of notification:  11/22/2012  Time of notification:  1230  Critical value read back:yes  Nurse who received alert:  c Shareka Casale rn  MD notified (1st page):  Dr zammit  Time of first page:  1230  MD notified (2nd page):  Time of second page:  Responding MD:  Dr Estell Harpin  Time MD responded:  1230

## 2012-11-22 NOTE — ED Notes (Signed)
Pt/family advised of flu test takes approx 1-1.5hrs for results per lab

## 2012-11-22 NOTE — Progress Notes (Signed)
Patient visibly anxious this evening. States that he frequently has a feeling of anxiety, and sometimes he goes for days without sleeping. He states he has not seen a psychiatrist for his symptoms. Patient also states that the only medications he takes are home are insulin, that he has no health insurance, and he is unable to pay for his medications. Case management consult ordered.

## 2012-11-23 LAB — BASIC METABOLIC PANEL
BUN: 7 mg/dL (ref 6–23)
BUN: 5 mg/dL — ABNORMAL LOW (ref 6–23)
BUN: 8 mg/dL (ref 6–23)
Calcium: 9 mg/dL (ref 8.4–10.5)
Chloride: 99 mEq/L (ref 96–112)
Chloride: 100 mEq/L (ref 96–112)
Chloride: 100 mEq/L (ref 96–112)
Chloride: 102 mEq/L (ref 96–112)
GFR calc Af Amer: >90 mL/min (ref >90)
GFR calc Af Amer: >90 mL/min (ref >90)
GFR calc non Af Amer: >90 mL/min (ref >90)
GFR calc non Af Amer: >90 mL/min (ref >90)
GFR calc non Af Amer: >90 mL/min (ref >90)
Glucose, Bld: 164 mg/dL — ABNORMAL HIGH (ref 70–99)
Glucose, Bld: 170 mg/dL — ABNORMAL HIGH (ref 70–99)
Potassium: 3.8 mEq/L (ref 3.5–5.1)
Potassium: 3.7 mEq/L (ref 3.5–5.1)
Potassium: 3.8 mEq/L (ref 3.5–5.1)
Sodium: 135 mEq/L (ref 135–145)
Sodium: 133 mEq/L — ABNORMAL LOW (ref 135–145)
Sodium: 135 mEq/L (ref 135–145)

## 2012-11-23 LAB — GLUCOSE, CAPILLARY
Glucose-Capillary: 136 mg/dL — ABNORMAL HIGH (ref 70–99)
Glucose-Capillary: 140 mg/dL — ABNORMAL HIGH (ref 70–99)
Glucose-Capillary: 144 mg/dL — ABNORMAL HIGH (ref 70–99)
Glucose-Capillary: 149 mg/dL — ABNORMAL HIGH (ref 70–99)
Glucose-Capillary: 176 mg/dL — ABNORMAL HIGH (ref 70–99)
Glucose-Capillary: 201 mg/dL — ABNORMAL HIGH (ref 70–99)
Glucose-Capillary: 216 mg/dL — ABNORMAL HIGH (ref 70–99)

## 2012-11-23 LAB — CBC
HCT: 36.1 % — ABNORMAL LOW (ref 39.0–52.0)
MCH: 29.2 pg (ref 26.0–34.0)
MCHC: 33 g/dL (ref 30.0–36.0)
MCV: 88.5 fL (ref 78.0–100.0)
Platelets: 367 10*3/uL (ref 150–400)
RDW: 14.1 % (ref 11.5–15.5)
WBC: 16.5 10*3/uL — ABNORMAL HIGH (ref 4.0–10.5)

## 2012-11-23 LAB — HEMOGLOBIN A1C: Mean Plasma Glucose: 283 mg/dL — ABNORMAL HIGH (ref <117)

## 2012-11-23 MED ORDER — INSULIN ASPART 100 UNIT/ML ~~LOC~~ SOLN
0.0000 [IU] | Freq: Three times a day (TID) | SUBCUTANEOUS | Status: DC
  Administered 2012-11-24: 5 [IU] via SUBCUTANEOUS

## 2012-11-23 MED ORDER — INSULIN ASPART 100 UNIT/ML ~~LOC~~ SOLN
6.0000 [IU] | Freq: Three times a day (TID) | SUBCUTANEOUS | Status: DC
  Administered 2012-11-24: 6 [IU] via SUBCUTANEOUS

## 2012-11-23 MED ORDER — INSULIN DETEMIR 100 UNIT/ML ~~LOC~~ SOLN
22.0000 [IU] | Freq: Every day | SUBCUTANEOUS | Status: DC
  Administered 2012-11-23: 22 [IU] via SUBCUTANEOUS
  Filled 2012-11-23: qty 10

## 2012-11-23 MED ORDER — POTASSIUM CHLORIDE CRYS ER 20 MEQ PO TBCR
20.0000 meq | EXTENDED_RELEASE_TABLET | Freq: Once | ORAL | Status: AC
  Administered 2012-11-23: 20 meq via ORAL
  Filled 2012-11-23: qty 1

## 2012-11-23 MED ORDER — INSULIN ASPART 100 UNIT/ML ~~LOC~~ SOLN: 0.0000 [IU] | Freq: Every day | SUBCUTANEOUS | Status: DC

## 2012-11-23 NOTE — Progress Notes (Signed)
Subjective: Vomiting resolved.  Feels well  Objective: Vital signs in last 24 hours: Filed Vitals:   11/23/12 0700 11/23/12 0800 11/23/12 0820 11/23/12 0900  BP: 128/70 109/66 117/71 106/63  Pulse:   92   Temp:   98 F (36.7 C)   TempSrc:   Oral   Resp: 18 24 19 19   Height:      Weight:      SpO2:   100%    Weight change:   Intake/Output Summary (Last 24 hours) at 11/23/12 1137 Last data filed at 11/23/12 0932  Gross per 24 hour  Intake 2540.32 ml  Output   2225 ml  Net 315.32 ml   General: Comfortable Lungs clear to auscultation bilaterally without wheeze rhonchi or rales Cardiovascular regular rate rhythm without murmurs gallops rubs Abdomen soft nontender nondistended Extremities no clubbing cyanosis or edema  Lab Results: Basic Metabolic Panel:  Lab 11/23/12 1478 11/23/12 0436  NA 133* 135  K 3.7 3.8  CL 100 100  CO2 19 21  GLUCOSE 164* 151*  BUN 5* 7  CREATININE 0.64 0.81  CALCIUM 9.2 9.2  MG -- --  PHOS -- --   Liver Function Tests: No results found for this basename: AST:2,ALT:2,ALKPHOS:2,BILITOT:2,PROT:2,ALBUMIN:2 in the last 168 hours No results found for this basename: LIPASE:2,AMYLASE:2 in the last 168 hours No results found for this basename: AMMONIA:2 in the last 168 hours CBC:  Lab 11/23/12 0436 11/22/12 1146  WBC 16.5* 17.3*  NEUTROABS -- 12.9*  HGB 11.9* 12.7*  HCT 36.1* 38.9*  MCV 88.5 89.2  PLT 367 395   CBG:  Lab 11/23/12 1037 11/23/12 0931 11/23/12 0818 11/23/12 0718 11/23/12 0612 11/23/12 0440  GLUCAP 140* 178* 243* 175* 230* 144*   Hemoglobin A1C:  Lab 11/22/12 1536  HGBA1C 11.5*    Lab 11/22/12 1215  COLORURINE STRAW*  LABSPEC 1.020  PHURINE 5.5  GLUCOSEU >1000*  HGBUR NEGATIVE  BILIRUBINUR NEGATIVE  KETONESUR >80*  PROTEINUR NEGATIVE  UROBILINOGEN 0.2  NITRITE NEGATIVE  LEUKOCYTESUR NEGATIVE   Micro Results: Recent Results (from the past 240 hour(s))  MRSA PCR SCREENING     Status: Normal   Collection Time    11/22/12  3:44 PM      Component Value Range Status Comment   MRSA by PCR NEGATIVE  NEGATIVE Final    Studies/Results: Dg Chest Port 1 View  11/22/2012  *RADIOLOGY REPORT*  Clinical Data: Weakness  PORTABLE CHEST - 1 VIEW  Comparison: 07/26/2010  Findings: Cardiomediastinal silhouette is within normal limits. The lungs are clear. No pleural effusion.  No pneumothorax.  No acute osseous abnormality.  Cardiac leads obscure detail.  IMPRESSION: No acute cardiopulmonary process.   Original Report Authenticated By: Christiana Pellant, M.D.    Scheduled Meds:   . enoxaparin (LOVENOX) injection  40 mg Subcutaneous Q24H   Continuous Infusions:   . sodium chloride Stopped (11/22/12 1928)  . dextrose 5 % and 0.45% NaCl 125 mL/hr at 11/23/12 0900  . insulin (NOVOLIN-R) infusion 0.8 Units/hr (11/23/12 0447)   PRN Meds:.acetaminophen, dextrose, LORazepam, ondansetron (ZOFRAN) IV Assessment/Plan: Principal Problem:  *DKA, type 1: Straight remains on an insulin drip as anion gap still slightly high. Active Problems:  Opiate dependence: essentially detoxed from methadone at this point.  Tobacco abuse  Diabetic nephropathy Vomiting resolved   LOS: 1 day   Tyler Mann 11/23/2012, 11:37 AM

## 2012-11-23 NOTE — Plan of Care (Signed)
Problem: Consults Goal: Diabetic Ketoacidosis (DKA) Patient Education See Patient Education Modules for education specifics. Outcome: Progressing Recurrent admissions for this brittle diabetic, nausea/ vomiting was symptom that brought him to the ED Goal: Diabetes Guidelines if Diabetic/Glucose > 140 If diabetic or lab glucose is > 140 mg/dl - Initiate Diabetes/Hyperglycemia Guidelines & Document Interventions  Outcome: Progressing Patient remains on glucostabilizer throughout the night.  Anion gap remains elevated at present  Problem: Phase I Progression Outcomes Goal: Acidosis resolving Outcome: Progressing Last gap was 18, to reassess at 0430 Goal: NPO or per MD order Outcome: Progressing Diabetic clears, carbohydrate covering via glucostabilizer Goal: Initial discharge plan identified Outcome: Completed/Met Date Met:  11/23/12 Home lives with Mom and Dad Goal: Pt. states reason for hospitalization Outcome: Completed/Met Date Met:  11/23/12 Nausea and vomiting brought him to the ED

## 2012-11-23 NOTE — Progress Notes (Signed)
Anion Gap at 2330  Bmet 18.  Patient resting at present.  Insulin drip continues

## 2012-11-24 DIAGNOSIS — E109 Type 1 diabetes mellitus without complications: Secondary | ICD-10-CM

## 2012-11-24 DIAGNOSIS — E1110 Type 2 diabetes mellitus with ketoacidosis without coma: Secondary | ICD-10-CM

## 2012-11-24 LAB — BASIC METABOLIC PANEL
BUN: 5 mg/dL — ABNORMAL LOW (ref 6–23)
CO2: 26 mEq/L (ref 19–32)
GFR calc non Af Amer: >90 mL/min (ref >90)
Glucose, Bld: 77 mg/dL (ref 70–99)
Potassium: 3.6 mEq/L (ref 3.5–5.1)
Sodium: 142 mEq/L (ref 135–145)

## 2012-11-24 LAB — GLUCOSE, CAPILLARY: Glucose-Capillary: 210 mg/dL — ABNORMAL HIGH (ref 70–99)

## 2012-11-24 MED ORDER — ONDANSETRON HCL 4 MG PO TABS: 4.0000 mg | ORAL_TABLET | Freq: Three times a day (TID) | ORAL | Status: DC | PRN

## 2012-11-24 NOTE — Discharge Summary (Signed)
Physician Discharge Summary  GLENDA KUNST MWU:[id number] DOB: Oct 19, 1981 DOA: 11/22/2012  PCP: Dwana Melena, MD Endocrinologist: Dr. Fransico Him  Admit date: 11/22/2012 Discharge date: 11/24/2012  Time spent: 35 minutes  Recommendations for Outpatient Follow-up:  1. Follow up with Dr. Fransico Him in the next week  Discharge Diagnoses:  Principal Problem:  *DKA, type 1 Active Problems:  Opiate dependence  Tobacco abuse  Diabetic nephropathy   Discharge Condition: improved  Diet recommendation: low carb diet  Filed Weights   11/22/12 1527 11/23/12 0646 11/24/12 0507  Weight: 70 kg (154 lb 5.2 oz) 72 kg (158 lb 11.7 oz) 72.7 kg (160 lb 4.4 oz)    History of present illness:  Tyler Mann is an 32 y.o. male type I diabetic who presented with vomiting and hyperglycemia. He has been giving himself extra lispro insulin but remains hyperglycemic. He used to see Dr. Juanetta Gosling for his primary care needs, but has switched to Dr. Shelva Majestic as his primary care provider. He noticed his blood sugars have been running higher than usual when he tried to quit methadone maintenance cold [place]. He has a history of IV heroin use, and has been on methadone for a year. He was tired of taking it and felt that it was making his manual dexterity worse. He's had no hematemesis. He has a chronic smoker's cough which is unchanged. No fevers or chills. No dysuria. No diarrhea. He received some Dilaudid in the emergency room, but would like to avoid any further. He reports that if he continues Zofran, he should be okay. He believes he has essentially detoxed from opiates. In the emergency room, he was noted to have moderate acetone, increased anion gap acidosis, and a blood glucose of greater than 600. He is also followed by Dr. Fransico Him. He reports that he call Dr. Fransico Him who recommended he come to the emergency room.   Hospital Course:  Patient was admitted to the hospital and receive standard treatment for diabetic ketoacidosis.  He was placed on insulin drip per protocol and received aggressive IV hydration. Since then, his anion gap has closed. His blood sugars are in reasonable range. Patient reports that his increased blood sugars were related to his opiate detoxification. He reports having a follow up appointment with Dr. Fransico Him in the next week. He reports compliance with his insulin as well as his diet. At this time, he appears to be doing well. He is requesting discharge home. I believe he is stable for discharge at this point.  Procedures:  None  Consultations:  None  Discharge Exam: Filed Vitals:   11/23/12 2030 11/24/12 0037 11/24/12 0507 11/24/12 0742  BP: 119/73  117/81   Pulse: 79  80   Temp: 98.4 F (36.9 C) 97.8 F (36.6 C) 98.3 F (36.8 C) 98.1 F (36.7 C)  TempSrc: Oral Oral Oral Oral  Resp: 20  20   Height:      Weight:   72.7 kg (160 lb 4.4 oz)   SpO2: 99%  99%     General: No acute distress Cardiovascular: S1, S2, regular rate and rhythm Respiratory: Clear to auscultation bilaterally  Discharge Instructions  Discharge Orders    Future Orders Please Complete By Expires   Diet - low sodium heart healthy      Diet Carb Modified      Increase activity slowly          Medication List     As of 11/24/2012  8:35 AM    TAKE  these medications         insulin detemir 100 UNIT/ML injection   Commonly known as: LEVEMIR   Inject 22 Units into the skin at bedtime.      insulin lispro 100 UNIT/ML injection   Commonly known as: HUMALOG   Inject 1-16 Units into the skin 3 (three) times daily before meals. Take 1 units if BG < 100, then increase by 1 unit for every 50 units >150. If BG > 400, take 16 units and call MD      ondansetron 4 MG tablet   Commonly known as: ZOFRAN   Take 1 tablet (4 mg total) by mouth every 8 (eight) hours as needed for nausea.           Follow-up Information    Follow up with [facility], MD. Schedule an appointment as soon as possible for a visit in 2  weeks.   Contact information:   [address] [place] [address] [phone]       Follow up with NIDA,GEBRESELASSIE, MD. (follow up by the end of this week)    Contact information:   [address] [place] [address] [phone]           The results of significant diagnostics from this hospitalization (including imaging, microbiology, ancillary and laboratory) are listed below for reference.    Significant Diagnostic Studies: Dg Chest Port 1 View  11/22/2012  *RADIOLOGY REPORT*  Clinical Data: Weakness  PORTABLE CHEST - 1 VIEW  Comparison: 07/26/2010  Findings: Cardiomediastinal silhouette is within normal limits. The lungs are clear. No pleural effusion.  No pneumothorax.  No acute osseous abnormality.  Cardiac leads obscure detail.  IMPRESSION: No acute cardiopulmonary process.   Original Report Authenticated By: Christiana Pellant, M.D.     Microbiology: Recent Results (from the past 240 hour(s))  MRSA PCR SCREENING     Status: Normal   Collection Time   11/22/12  3:44 PM      Component Value Range Status Comment   MRSA by PCR NEGATIVE  NEGATIVE Final      Labs: Basic Metabolic Panel:  Lab 11/24/12 5784 11/23/12 1436 11/23/12 1009 11/23/12 0436 11/22/12 2326  NA 142 135 133* 135 133*  K 3.6 3.8 3.7 3.8 3.7  CL 105 102 100 100 99  CO2 26 22 19 21  16*  GLUCOSE 77 170* 164* 151* 211*  BUN 5* 8 5* 7 10  CREATININE 0.64 0.79 0.64 0.81 0.81  CALCIUM 9.3 9.1 9.2 9.2 9.0  MG -- -- -- -- --  PHOS -- -- -- -- --   Liver Function Tests: No results found for this basename: AST:5,ALT:5,ALKPHOS:5,BILITOT:5,PROT:5,ALBUMIN:5 in the last 168 hours No results found for this basename: LIPASE:5,AMYLASE:5 in the last 168 hours No results found for this basename: AMMONIA:5 in the last 168 hours CBC:  Lab 11/23/12 0436 11/22/12 1146  WBC 16.5* 17.3*  NEUTROABS -- 12.9*  HGB 11.9* 12.7*  HCT 36.1* 38.9*  MCV 88.5 89.2  PLT 367 395   Cardiac Enzymes: No  results found for this basename: CKTOTAL:5,CKMB:5,CKMBINDEX:5,TROPONINI:5 in the last 168 hours BNP: BNP (last 3 results) No results found for this basename: PROBNP:3 in the last 8760 hours CBG:  Lab 11/24/12 0725 11/23/12 2028 11/23/12 1720 11/23/12 1615 11/23/12 1511  GLUCAP 210* 126* 219* 149* 136*       Signed:  Nicholette Dolson  [facility] 11/24/2012, 8:35 AM

## 2012-11-24 NOTE — Progress Notes (Signed)
Discharged patient to home with instructions for follow up appointments. Prescriptions at [facility]. Vitals WNL. Discussed attempting to obtain health insurance thru the new [employer] Act. States he in in the process of trying to obtain disability. Verbalized understanding of discharge instructions through teachback method. Escorted to private vehicle and family per wheelchair. IV removed.

## 2013-01-21 ENCOUNTER — Emergency Department (HOSPITAL_COMMUNITY)
Admission: EM | Admit: 2013-01-21 | Discharge: 2013-01-21 | Disposition: A | Discharge status: Home / Self Care | Payer: Self-pay | Attending: Emergency Medicine | Admitting: Emergency Medicine

## 2013-01-21 ENCOUNTER — Emergency Department (HOSPITAL_COMMUNITY): Payer: Self-pay

## 2013-01-21 ENCOUNTER — Encounter (HOSPITAL_COMMUNITY): Payer: Self-pay

## 2013-01-21 DIAGNOSIS — F17200 Nicotine dependence, unspecified, uncomplicated: Secondary | ICD-10-CM | POA: Insufficient documentation

## 2013-01-21 DIAGNOSIS — R059 Cough, unspecified: Secondary | ICD-10-CM | POA: Insufficient documentation

## 2013-01-21 DIAGNOSIS — J209 Acute bronchitis, unspecified: Secondary | ICD-10-CM | POA: Insufficient documentation

## 2013-01-21 DIAGNOSIS — Z794 Long term (current) use of insulin: Secondary | ICD-10-CM | POA: Insufficient documentation

## 2013-01-21 DIAGNOSIS — Z8669 Personal history of other diseases of the nervous system and sense organs: Secondary | ICD-10-CM | POA: Insufficient documentation

## 2013-01-21 DIAGNOSIS — J40 Bronchitis, not specified as acute or chronic: Secondary | ICD-10-CM

## 2013-01-21 DIAGNOSIS — R5381 Other malaise: Secondary | ICD-10-CM | POA: Insufficient documentation

## 2013-01-21 DIAGNOSIS — F1920 Other psychoactive substance dependence, uncomplicated: Secondary | ICD-10-CM | POA: Insufficient documentation

## 2013-01-21 DIAGNOSIS — R739 Hyperglycemia, unspecified: Secondary | ICD-10-CM

## 2013-01-21 DIAGNOSIS — E1169 Type 2 diabetes mellitus with other specified complication: Secondary | ICD-10-CM | POA: Insufficient documentation

## 2013-01-21 LAB — GLUCOSE, CAPILLARY
Glucose-Capillary: 413 mg/dL — ABNORMAL HIGH (ref 70–99)
Glucose-Capillary: 77 mg/dL (ref 70–99)

## 2013-01-21 LAB — BASIC METABOLIC PANEL
CO2: 19 mEq/L (ref 19–32)
Chloride: 93 mEq/L — ABNORMAL LOW (ref 96–112)
Creatinine, Ser: 0.72 mg/dL (ref 0.50–1.35)
GFR calc Af Amer: >90 mL/min (ref >90)
Potassium: 4.2 mEq/L (ref 3.5–5.1)
Sodium: 132 mEq/L — ABNORMAL LOW (ref 135–145)

## 2013-01-21 LAB — CBC WITH DIFFERENTIAL/PLATELET
Basophils Absolute: 0.1 10*3/uL (ref 0.0–0.1)
Basophils Relative: 0 % (ref 0–1)
HCT: 36.3 % — ABNORMAL LOW (ref 39.0–52.0)
Lymphocytes Relative: 14 % (ref 12–46)
MCHC: 33.3 g/dL (ref 30.0–36.0)
Monocytes Absolute: 0.6 10*3/uL (ref 0.1–1.0)
Neutro Abs: 13.3 10*3/uL — ABNORMAL HIGH (ref 1.7–7.7)
Neutrophils Relative %: 81 % — ABNORMAL HIGH (ref 43–77)
Platelets: 307 10*3/uL (ref 150–400)
RDW: 13.5 % (ref 11.5–15.5)
WBC: 16.3 10*3/uL — ABNORMAL HIGH (ref 4.0–10.5)

## 2013-01-21 MED ORDER — AMOXICILLIN 500 MG PO CAPS: 500.0000 mg | ORAL_CAPSULE | Freq: Three times a day (TID) | ORAL | Status: DC

## 2013-01-21 MED ORDER — INSULIN ASPART 100 UNIT/ML ~~LOC~~ SOLN
10.0000 [IU] | Freq: Once | SUBCUTANEOUS | Status: AC
  Administered 2013-01-21: 10 [IU] via SUBCUTANEOUS
  Filled 2013-01-21: qty 1

## 2013-01-21 MED ORDER — SODIUM CHLORIDE 0.9 % IV BOLUS (SEPSIS)
1000.0000 mL | Freq: Once | INTRAVENOUS | Status: AC
  Administered 2013-01-21: 1000 mL via INTRAVENOUS

## 2013-01-21 NOTE — ED Notes (Signed)
Pt reports has had a cold for past few days.  Reports blood sugar has been very high for the past 3 days.  Today cbg registered hi on his machine.  Pt took 22units of levemir and 12 units of humalog approx ago.  Pt also c/o feeling lightheaded.

## 2013-01-21 NOTE — ED Provider Notes (Signed)
History     CSN: [id number]  Arrival date & time 01/21/13  1633   First MD Initiated Contact with Patient 01/21/13 1648      Chief Complaint  Patient presents with  . Hyperglycemia  . Dizziness    (Consider location/radiation/quality/duration/timing/severity/associated sxs/prior treatment) Patient is a 32 y.o. male presenting with cough. The history is provided by the patient (pt complains of a cough and his sugars being high).  Cough Cough characteristics:  Non-productive Severity:  Moderate Onset quality:  Gradual Timing:  Constant Progression:  Worsening Chronicity:  New Associated symptoms: no chest pain, no eye discharge, no headaches and no rash     Past Medical History  Diagnosis Date  . Diabetes mellitus   . Neuropathy   . Narcotic addiction     History reviewed. No pertinent past surgical history.  No family history on file.  History  Substance Use Topics  . Smoking status: Current Every Day Smoker -- 0.50 packs/day for 17 years    Types: Cigarettes  . Smokeless tobacco: Not on file  . Alcohol Use: No      Review of Systems  Constitutional: Positive for fatigue.  HENT: Negative for congestion, sinus pressure and ear discharge.   Eyes: Negative for discharge.  Respiratory: Positive for cough.   Cardiovascular: Negative for chest pain.  Gastrointestinal: Negative for abdominal pain and diarrhea.  Genitourinary: Negative for frequency and hematuria.  Musculoskeletal: Negative for back pain.  Skin: Negative for rash.  Neurological: Negative for seizures and headaches.  Psychiatric/Behavioral: Negative for hallucinations.    Allergies  Review of patient's allergies indicates no known allergies.  Home Medications   Current Outpatient Rx  Name  Route  Sig  Dispense  Refill  . Chlorpheniramine-DM (COUGH & COLD PO)   Oral   Take 1-2 tablets by mouth daily as needed (FOR COUGH AND COLD SYMPTOMS).         . insulin detemir (LEVEMIR) 100 UNIT/ML  injection   Subcutaneous   Inject 22 Units into the skin every morning.          . insulin lispro (HUMALOG) 100 UNIT/ML injection   Subcutaneous   Inject 1-20 Units into the skin 3 (three) times daily before meals. For levels between 90-150, inject 10 units. Sliding scales increases at 151 to 11 units and for every increase of 50 units patient injects 1 additional unit. If levels increase over 400, call MD.         . ondansetron (ZOFRAN) 4 MG tablet   Oral   Take 1 tablet (4 mg total) by mouth every 8 (eight) hours as needed for nausea.   20 tablet   0   . amoxicillin (AMOXIL) 500 MG capsule   Oral   Take 1 capsule (500 mg total) by mouth 3 (three) times daily.   21 capsule   0     BP 113/73  Pulse 83  Temp(Src) 97.4 F (36.3 C) (Oral)  Resp 20  Ht 6' (1.829 m)  Wt 166 lb (75.297 kg)  BMI 22.51 kg/m2  SpO2 99%  Physical Exam  Constitutional: He is oriented to person, place, and time. He appears well-developed.  HENT:  Head: Normocephalic and atraumatic.  Eyes: Conjunctivae and EOM are normal. No scleral icterus.  Neck: Neck supple. No thyromegaly present.  Cardiovascular: Normal rate and regular rhythm.  Exam reveals no gallop and no friction rub.   No murmur heard. Pulmonary/Chest: No stridor. He has no wheezes. He has no  rales. He exhibits no tenderness.  Abdominal: He exhibits no distension. There is no tenderness. There is no rebound.  Musculoskeletal: Normal range of motion. He exhibits no edema.  Lymphadenopathy:    He has no cervical adenopathy.  Neurological: He is oriented to person, place, and time. Coordination normal.  Skin: No rash noted. No erythema.  Psychiatric: He has a normal mood and affect. His behavior is normal.    ED Course  Procedures (including critical care time)  Labs Reviewed  GLUCOSE, CAPILLARY - Abnormal; Notable for the following:    Glucose-Capillary 413 (*)    All other components within normal limits  CBC WITH DIFFERENTIAL  - Abnormal; Notable for the following:    WBC 16.3 (*)    RBC 4.14 (*)    Hemoglobin 12.1 (*)    HCT 36.3 (*)    Neutrophils Relative 81 (*)    Neutro Abs 13.3 (*)    All other components within normal limits  BASIC METABOLIC PANEL - Abnormal; Notable for the following:    Sodium 132 (*)    Chloride 93 (*)    Glucose, Bld 376 (*)    All other components within normal limits  GLUCOSE, CAPILLARY   Dg Chest 2 View  01/21/2013  *RADIOLOGY REPORT*  Clinical Data: Dizzy, diabetic  CHEST - 2 VIEW  Comparison: Chest radiograph 11/22/2012  Findings: Normal cardiac silhouette.  Costophrenic angles are clear.  No effusion, infiltrate, or pneumothorax.  IMPRESSION: No acute cardiopulmonary process.   Original Report Authenticated By: Genevive Bi, M.D.      1. Hyperglycemia   2. Bronchitis       MDM  Pt improved with tx.  Wanted to go home.  He is to eat his dinner directly after being discharged        Benny Lennert, MD 01/21/13 2030

## 2013-01-21 NOTE — ED Notes (Signed)
BGL 413.  Nurse notified

## 2013-04-02 ENCOUNTER — Encounter (HOSPITAL_COMMUNITY): Payer: Self-pay | Admitting: Emergency Medicine

## 2013-04-02 ENCOUNTER — Inpatient Hospital Stay (HOSPITAL_COMMUNITY)
Admission: EM | Admit: 2013-04-02 | Discharge: 2013-04-03 | DRG: 295 | Disposition: A | Discharge status: Home / Self Care | Payer: BC Managed Care – PPO | Attending: Internal Medicine | Admitting: Internal Medicine

## 2013-04-02 DIAGNOSIS — Z720 Tobacco use: Secondary | ICD-10-CM | POA: Diagnosis present

## 2013-04-02 DIAGNOSIS — F1120 Opioid dependence, uncomplicated: Secondary | ICD-10-CM | POA: Diagnosis present

## 2013-04-02 DIAGNOSIS — Z794 Long term (current) use of insulin: Secondary | ICD-10-CM

## 2013-04-02 DIAGNOSIS — G8929 Other chronic pain: Secondary | ICD-10-CM | POA: Diagnosis present

## 2013-04-02 DIAGNOSIS — F17200 Nicotine dependence, unspecified, uncomplicated: Secondary | ICD-10-CM | POA: Diagnosis present

## 2013-04-02 DIAGNOSIS — G589 Mononeuropathy, unspecified: Secondary | ICD-10-CM | POA: Diagnosis present

## 2013-04-02 DIAGNOSIS — E109 Type 1 diabetes mellitus without complications: Secondary | ICD-10-CM | POA: Diagnosis present

## 2013-04-02 DIAGNOSIS — E1010 Type 1 diabetes mellitus with ketoacidosis without coma: Principal | ICD-10-CM | POA: Diagnosis present

## 2013-04-02 LAB — CBC
MCH: 27.3 pg (ref 26.0–34.0)
MCHC: 31 g/dL (ref 30.0–36.0)
MCV: 88.1 fL (ref 78.0–100.0)
Platelets: 302 10*3/uL (ref 150–400)
RBC: 4.62 MIL/uL (ref 4.22–5.81)

## 2013-04-02 LAB — COMPREHENSIVE METABOLIC PANEL
AST: 17 U/L (ref 0–37)
CO2: 23 mEq/L (ref 19–32)
Calcium: 9 mg/dL (ref 8.4–10.5)
Creatinine, Ser: 0.89 mg/dL (ref 0.50–1.35)
GFR calc Af Amer: >90 mL/min (ref >90)
GFR calc non Af Amer: >90 mL/min (ref >90)
Glucose, Bld: 957 mg/dL (ref 70–99)
Total Protein: 7.5 g/dL (ref 6.0–8.3)

## 2013-04-02 LAB — BLOOD GAS, ARTERIAL
Acid-base deficit: 4.4 mmol/L — ABNORMAL HIGH (ref 0.0–2.0)
Drawn by: 22223
FIO2: 21 %
O2 Saturation: 96.2 %
Patient temperature: 37
pO2, Arterial: 89.2 mmHg (ref 80.0–100.0)

## 2013-04-02 LAB — URINALYSIS, ROUTINE W REFLEX MICROSCOPIC
Leukocytes, UA: NEGATIVE
Nitrite: NEGATIVE
Specific Gravity, Urine: 1.01 (ref 1.005–1.030)
Urobilinogen, UA: 0.2 mg/dL (ref 0.0–1.0)
pH: 5.5 (ref 5.0–8.0)

## 2013-04-02 LAB — GLUCOSE, CAPILLARY: Glucose-Capillary: >600 mg/dL (ref 70–99)

## 2013-04-02 MED ORDER — INSULIN REGULAR HUMAN 100 UNIT/ML IJ SOLN
INTRAMUSCULAR | Status: AC
  Filled 2013-04-02: qty 3

## 2013-04-02 MED ORDER — HYDROMORPHONE HCL PF 1 MG/ML IJ SOLN
1.0000 mg | Freq: Once | INTRAMUSCULAR | Status: AC
  Administered 2013-04-02: 1 mg via INTRAVENOUS
  Filled 2013-04-02: qty 1

## 2013-04-02 MED ORDER — INSULIN REGULAR BOLUS VIA INFUSION
0.0000 [IU] | Freq: Three times a day (TID) | INTRAVENOUS | Status: DC
  Filled 2013-04-02: qty 10

## 2013-04-02 MED ORDER — SODIUM CHLORIDE 0.9 % IV BOLUS (SEPSIS)
1000.0000 mL | Freq: Once | INTRAVENOUS | Status: AC
  Administered 2013-04-02: 1000 mL via INTRAVENOUS

## 2013-04-02 MED ORDER — SODIUM CHLORIDE 0.9 % IV SOLN
INTRAVENOUS | Status: DC
  Administered 2013-04-03: 01:00:00 via INTRAVENOUS

## 2013-04-02 MED ORDER — ONDANSETRON HCL 4 MG/2ML IJ SOLN
4.0000 mg | Freq: Once | INTRAMUSCULAR | Status: AC
  Administered 2013-04-02: 4 mg via INTRAVENOUS
  Filled 2013-04-02: qty 2

## 2013-04-02 MED ORDER — DEXTROSE 50 % IV SOLN: 25.0000 mL | INTRAVENOUS | Status: DC | PRN

## 2013-04-02 MED ORDER — SODIUM CHLORIDE 0.9 % IV SOLN
INTRAVENOUS | Status: DC
  Administered 2013-04-03: 5.4 [IU]/h via INTRAVENOUS
  Filled 2013-04-02: qty 1

## 2013-04-02 NOTE — ED Provider Notes (Signed)
History    This chart was scribed for [name], MD,  by Concha Se, ED Scribe. The patient was seen in room APA02/APA02 and the patient's care was started at 11:07 PM.     CSN: [id number]  Arrival date & time 04/02/13  2223      Chief Complaint  Patient presents with  . Hyperglycemia   The history is provided by the patient and medical records. No language interpreter was used.   HPI Comments: Tyler Mann is a 32 y.o. male who presents to the Emergency Department complaining of hyperglycemia that started at  2 PM today which is uncontrollable with insulin.  Pt has h/o diabetes.Pt states he has been admitted 4 months ago for DKA. Pt states he has taken 100 units of humalog since 4PM with no improvement.  Pt states he has been under work stress that has got better.Pt also states he is experiencing nausea. Pt states he experiences pain to extremeties when he experiences elevated blood sugar level which he is current experiencing. Symptoms MOD in severity.  Past Medical History  Diagnosis Date  . Diabetes mellitus   . Neuropathy   . Narcotic addiction     History reviewed. No pertinent past surgical history.  History reviewed. No pertinent family history.  History  Substance Use Topics  . Smoking status: Current Every Day Smoker -- 0.50 packs/day for 17 years    Types: Cigarettes  . Smokeless tobacco: Not on file  . Alcohol Use: No      Review of Systems  Constitutional: Negative for fever and chills.  HENT: Negative for neck pain and neck stiffness.   Eyes: Negative for pain.  Respiratory: Negative for shortness of breath.   Cardiovascular: Negative for chest pain.  Gastrointestinal: Positive for nausea. Negative for abdominal distention.  Genitourinary: Negative for dysuria.  Musculoskeletal: Positive for arthralgias (pain to extremities ). Negative for back pain.  Skin: Negative for rash.  Neurological: Negative for headaches.  All other systems reviewed  and are negative.   Allergies  Review of patient's allergies indicates no known allergies.  Home Medications   Current Outpatient Rx  Name  Route  Sig  Dispense  Refill  . amoxicillin (AMOXIL) 500 MG capsule   Oral   Take 1 capsule (500 mg total) by mouth 3 (three) times daily.   21 capsule   0   . Chlorpheniramine-DM (COUGH & COLD PO)   Oral   Take 1-2 tablets by mouth daily as needed (FOR COUGH AND COLD SYMPTOMS).         . insulin detemir (LEVEMIR) 100 UNIT/ML injection   Subcutaneous   Inject 22 Units into the skin every morning.          . insulin lispro (HUMALOG) 100 UNIT/ML injection   Subcutaneous   Inject 1-20 Units into the skin 3 (three) times daily before meals. For levels between 90-150, inject 10 units. Sliding scales increases at 151 to 11 units and for every increase of 50 units patient injects 1 additional unit. If levels increase over 400, call MD.         . ondansetron (ZOFRAN) 4 MG tablet   Oral   Take 1 tablet (4 mg total) by mouth every 8 (eight) hours as needed for nausea.   20 tablet   0     BP 147/85  Pulse 106  Temp(Src) 97.4 F (36.3 C) (Oral)  Resp 22  Ht 6' (1.829 m)  Wt 160 lb (  72.576 kg)  BMI 21.7 kg/m2  SpO2 98%  Physical Exam  Nursing note and vitals reviewed. Constitutional: He is oriented to person, place, and time. He appears well-developed and well-nourished.  HENT:  Head: Normocephalic and atraumatic.  Dry mucus membrane. Clear posterior pharynx.  Eyes: EOM are normal. Pupils are equal, round, and reactive to light. No scleral icterus.  Neck: Normal range of motion. Neck supple.  Cardiovascular: Regular rhythm, normal heart sounds and intact distal pulses.  Tachycardia present.   Pulmonary/Chest: Effort normal and breath sounds normal. No respiratory distress.  Abdominal: Soft. Bowel sounds are normal. He exhibits no distension and no mass. There is tenderness. There is no rebound and no guarding.  mild diffuse  abdominal tenderness.  Genitourinary: Rectum normal.  Musculoskeletal: Normal range of motion. He exhibits no edema.  Neurological: He is alert and oriented to person, place, and time.  Skin: Skin is warm and dry.  Psychiatric: He has a normal mood and affect. Judgment normal.    ED Course  Procedures (including critical care time) DIAGNOSTIC STUDIES: Oxygen Saturation is 98% on room air, normal by my interpretation.    COORDINATION OF CARE: 11:02 PM Discussed ED treatment with pt and pt agrees to treatment plan.  Results for orders placed during the hospital encounter of 04/02/13  GLUCOSE, CAPILLARY      Result Value Range   Glucose-Capillary >600 (*) 70 - 99 mg/dL  BLOOD GAS, ARTERIAL      Result Value Range   FIO2 21.00     Delivery systems ROOM AIR     pH, Arterial 7.337 (*) 7.350 - 7.450   pCO2 arterial 39.1  35.0 - 45.0 mmHg   pO2, Arterial 89.2  80.0 - 100.0 mmHg   Bicarbonate 20.4  20.0 - 24.0 mEq/L   TCO2 18.9  0 - 100 mmol/L   Acid-base deficit 4.4 (*) 0.0 - 2.0 mmol/L   O2 Saturation 96.2     Patient temperature 37.0     Collection site LEFT RADIAL     Drawn by [id number]     Sample type ARTERIAL     Allens test (pass/fail) PASS  PASS  CBC      Result Value Range   WBC 9.9  4.0 - 10.5 K/uL   RBC 4.62  4.22 - 5.81 MIL/uL   Hemoglobin 12.6 (*) 13.0 - 17.0 g/dL   HCT 19.1  47.8 - 29.5 %   MCV 88.1  78.0 - 100.0 fL   MCH 27.3  26.0 - 34.0 pg   MCHC 31.0  30.0 - 36.0 g/dL   RDW 62.1  30.8 - 65.7 %   Platelets 302  150 - 400 K/uL  COMPREHENSIVE METABOLIC PANEL      Result Value Range   Sodium 127 (*) 135 - 145 mEq/L   Potassium 4.9  3.5 - 5.1 mEq/L   Chloride 89 (*) 96 - 112 mEq/L   CO2 23  19 - 32 mEq/L   Glucose, Bld 957 (*) 70 - 99 mg/dL   BUN 19  6 - 23 mg/dL   Creatinine, Ser 8.46  0.50 - 1.35 mg/dL   Calcium 9.0  8.4 - 96.2 mg/dL   Total Protein 7.5  6.0 - 8.3 g/dL   Albumin 3.9  3.5 - 5.2 g/dL   AST 17  0 - 37 U/L   ALT 17  0 - 53 U/L   Alkaline  Phosphatase 98  39 - 117 U/L   Total Bilirubin  0.2 (*) 0.3 - 1.2 mg/dL   GFR calc non Af Amer >90  >90 mL/min   GFR calc Af Amer >90  >90 mL/min  URINALYSIS, ROUTINE W REFLEX MICROSCOPIC      Result Value Range   Color, Urine STRAW (*) YELLOW   APPearance CLEAR  CLEAR   Specific Gravity, Urine 1.010  1.005 - 1.030   pH 5.5  5.0 - 8.0   Glucose, UA >1000 (*) NEGATIVE mg/dL   Hgb urine dipstick NEGATIVE  NEGATIVE   Bilirubin Urine NEGATIVE  NEGATIVE   Ketones, ur TRACE (*) NEGATIVE mg/dL   Protein, ur NEGATIVE  NEGATIVE mg/dL   Urobilinogen, UA 0.2  0.0 - 1.0 mg/dL   Nitrite NEGATIVE  NEGATIVE   Leukocytes, UA NEGATIVE  NEGATIVE  URINE MICROSCOPIC-ADD ON      Result Value Range   Urine-Other       Value: NO FORMED ELEMENTS SEEN ON URINE MICROSCOPIC EXAMINATION  GLUCOSE, CAPILLARY      Result Value Range   Glucose-Capillary >600 (*) 70 - 99 mg/dL   CRITICAL CARE Performed by: [name] Total critical care time: 30 Critical care time was exclusive of separately billable procedures and treating other patients. Critical care was necessary to treat or prevent imminent or life-threatening deterioration. Critical care was time spent personally by me on the following activities: development of treatment plan with patient and/or surrogate as well as nursing, discussions with consultants, evaluation of patient's response to treatment, examination of patient, obtaining history from patient or surrogate, ordering and performing treatments and interventions, ordering and review of laboratory studies, ordering and review of radiographic studies, pulse oximetry and re-evaluation of patient's condition. IVFs. IV Insulin drip, serial evaluations, IV narcotics pain control, d/w Dr Orvan Falconer at 1205am and MED admit step down ICU  MDM  Early DKA (mild gap and pH < 7.35)/ Hyperglycemia  Labs obtained/ reviewed as above  IVFs and IV insulin initiated in the ED  MED admit    I personally  performed the services described in this documentation, which was scribed in my presence. The recorded information has been reviewed and is accurate.     [name], MD 04/03/13 [phone]

## 2013-04-02 NOTE — ED Notes (Signed)
Patient with history of diabetes, reports large amount resulted for urine ketone test at home. Also reports glucose at home was greater than 600. Reports feeling generally weak.

## 2013-04-03 ENCOUNTER — Encounter (HOSPITAL_COMMUNITY): Payer: Self-pay | Admitting: Internal Medicine

## 2013-04-03 DIAGNOSIS — G8929 Other chronic pain: Secondary | ICD-10-CM

## 2013-04-03 DIAGNOSIS — E1010 Type 1 diabetes mellitus with ketoacidosis without coma: Secondary | ICD-10-CM

## 2013-04-03 DIAGNOSIS — F1120 Opioid dependence, uncomplicated: Secondary | ICD-10-CM

## 2013-04-03 DIAGNOSIS — F17200 Nicotine dependence, unspecified, uncomplicated: Secondary | ICD-10-CM

## 2013-04-03 DIAGNOSIS — E109 Type 1 diabetes mellitus without complications: Secondary | ICD-10-CM

## 2013-04-03 LAB — CBC
Hemoglobin: 11.6 g/dL — ABNORMAL LOW (ref 13.0–17.0)
MCH: 27.2 pg (ref 26.0–34.0)
MCHC: 32.3 g/dL (ref 30.0–36.0)
MCV: 84.1 fL (ref 78.0–100.0)

## 2013-04-03 LAB — BASIC METABOLIC PANEL
BUN: 9 mg/dL (ref 6–23)
CO2: 23 mEq/L (ref 19–32)
Calcium: 8.6 mg/dL (ref 8.4–10.5)
Calcium: 8.7 mg/dL (ref 8.4–10.5)
Creatinine, Ser: 0.91 mg/dL (ref 0.50–1.35)
GFR calc non Af Amer: >90 mL/min (ref >90)
Glucose, Bld: 780 mg/dL (ref 70–99)
Glucose, Bld: 105 mg/dL — ABNORMAL HIGH (ref 70–99)
Potassium: 3.6 mEq/L (ref 3.5–5.1)

## 2013-04-03 LAB — GLUCOSE, CAPILLARY
Glucose-Capillary: >600 mg/dL (ref 70–99)
Glucose-Capillary: >600 mg/dL (ref 70–99)
Glucose-Capillary: 490 mg/dL — ABNORMAL HIGH (ref 70–99)
Glucose-Capillary: 50 mg/dL — ABNORMAL LOW (ref 70–99)
Glucose-Capillary: 64 mg/dL — ABNORMAL LOW (ref 70–99)

## 2013-04-03 LAB — MAGNESIUM: Magnesium: 2 mg/dL (ref 1.5–2.5)

## 2013-04-03 MED ORDER — VITAMIN B-1 100 MG PO TABS: 100.0000 mg | ORAL_TABLET | Freq: Every day | ORAL | Status: DC

## 2013-04-03 MED ORDER — TRAZODONE HCL 50 MG PO TABS: 75.0000 mg | ORAL_TABLET | Freq: Every evening | ORAL | Status: DC | PRN

## 2013-04-03 MED ORDER — INSULIN ASPART 100 UNIT/ML ~~LOC~~ SOLN: 0.0000 [IU] | Freq: Every day | SUBCUTANEOUS | Status: DC

## 2013-04-03 MED ORDER — INSULIN ASPART 100 UNIT/ML ~~LOC~~ SOLN: 0.0000 [IU] | Freq: Three times a day (TID) | SUBCUTANEOUS | Status: DC

## 2013-04-03 MED ORDER — DEXTROSE-NACL 5-0.45 % IV SOLN
INTRAVENOUS | Status: DC
  Administered 2013-04-03: 1000 mL via INTRAVENOUS

## 2013-04-03 MED ORDER — FOLIC ACID 1 MG PO TABS: 1.0000 mg | ORAL_TABLET | Freq: Every day | ORAL | Status: DC

## 2013-04-03 MED ORDER — SODIUM CHLORIDE 0.9 % IV SOLN: INTRAVENOUS | Status: DC

## 2013-04-03 MED ORDER — POTASSIUM CHLORIDE 10 MEQ/100ML IV SOLN
10.0000 meq | INTRAVENOUS | Status: AC
  Administered 2013-04-03 (×2): 10 meq via INTRAVENOUS
  Filled 2013-04-03: qty 200

## 2013-04-03 MED ORDER — ENOXAPARIN SODIUM 40 MG/0.4ML ~~LOC~~ SOLN: 40.0000 mg | SUBCUTANEOUS | Status: DC

## 2013-04-03 MED ORDER — HYDROMORPHONE HCL PF 1 MG/ML IJ SOLN: 1.0000 mg | INTRAMUSCULAR | Status: DC | PRN

## 2013-04-03 MED ORDER — SODIUM CHLORIDE 0.9 % IJ SOLN
3.0000 mL | Freq: Two times a day (BID) | INTRAMUSCULAR | Status: DC
  Administered 2013-04-03: 3 mL via INTRAVENOUS

## 2013-04-03 MED ORDER — INSULIN DETEMIR 100 UNIT/ML ~~LOC~~ SOLN
22.0000 [IU] | Freq: Every day | SUBCUTANEOUS | Status: DC
  Filled 2013-04-03: qty 0.22

## 2013-04-03 MED ORDER — SODIUM CHLORIDE 0.9 % IV SOLN
INTRAVENOUS | Status: DC
  Filled 2013-04-03: qty 1

## 2013-04-03 MED ORDER — NICOTINE 21 MG/24HR TD PT24: 21.0000 mg | MEDICATED_PATCH | Freq: Every day | TRANSDERMAL | Status: DC | PRN

## 2013-04-03 MED ORDER — ACETAMINOPHEN 325 MG PO TABS: 650.0000 mg | ORAL_TABLET | ORAL | Status: DC | PRN

## 2013-04-03 MED ORDER — DEXTROSE 50 % IV SOLN: 25.0000 mL | INTRAVENOUS | Status: DC | PRN

## 2013-04-03 MED ORDER — ONDANSETRON HCL 4 MG/2ML IJ SOLN: 4.0000 mg | Freq: Three times a day (TID) | INTRAMUSCULAR | Status: DC | PRN

## 2013-04-03 MED ORDER — ONDANSETRON HCL 4 MG/2ML IJ SOLN: 4.0000 mg | INTRAMUSCULAR | Status: DC | PRN

## 2013-04-03 NOTE — Progress Notes (Signed)
Patient discharged to home with father. Instructions given regarding medications, activity, diet, follow-up appointment. IV removed, site clean, dry, and intact.

## 2013-04-03 NOTE — Clinical Social Work Note (Signed)
CSW received consult for narcotic abuse. Pt d/c this morning already prior to CSW assessment.   Derenda Fennel, [place] [phone]

## 2013-04-03 NOTE — Progress Notes (Signed)
UR Chart Review Completed  

## 2013-04-03 NOTE — H&P (Signed)
[facility] History and Physical  Tyler Mann  OZH:[id number]  DOB: [birth year]   DOA: 04/03/2013   PCP:   Catalina Pizza, MD  Endocrinologist: Dr. Fransico Him  Chief Complaint:  Elevated blood sugar since noon today  HPI: Tyler Mann is an 32 y.o. male.   With a history of diabetes type 1, and narcotic abuse, presents to the emergency room complaining of uncontrolled blood sugars since midday. Before lunch his blood sugar was 450, and although he denies eating at all today, he reports his blood sugars getting progressively higher despite repeated doses of Humalog, total of 100 units. At baseline he takes 22 units of Levemir daily, and sliding scale Humalog, 10-15 units 4 times daily.  He says the stress of work causes his blood sugars to be elevated, but in fact he has not worked for a week since he lost his job.  He denies nausea or vomiting, but says he has been having diarrhea for the past 2 days; 10 watery loose stool on Wednesday; 6 on Friday. He has had no fevers nor chills.  He takes typically 90 mg of illegally purchased Roxicodone daily, and says this is to prevent pains in his upper and lower extremities; he does have a history of IV narcotic abuse; he declines treatment for his drug addiction; he has failed methadone therapy.  He smokes one and a half packs of cigarettes per day and does not wish to quit.    Rewiew of Systems:   All systems negative except as marked bold or noted in the HPI;  Constitutional:    malaise, fever and chills. ;  Eyes:   eye pain, redness and discharge. ;  ENMT:   ear pain, hoarseness, nasal congestion, sinus pressure and sore throat. ;  Cardiovascular:    chest pain, palpitations, diaphoresis, dyspnea and peripheral edema.  Respiratory:   cough, hemoptysis, wheezing and stridor. ;  Gastrointestinal:  nausea, vomiting, diarrhea, constipation, abdominal pain, melena, blood in stool, hematemesis, jaundice and rectal bleeding. unusual weight  loss..   Genitourinary:    frequency, dysuria, incontinence,flank pain and hematuria; Musculoskeletal:   back pain and neck pain.  swelling and trauma.;  Skin: .  pruritus, rash, abrasions, bruising and skin lesion.; ulcerations Neuro:    headache, lightheadedness and neck stiffness.  weakness, altered level of consciousness, altered mental status, extremity weakness, burning feet, involuntary movement, seizure and syncope.  Psych:    anxiety, depression, insomnia, tearfulness, panic attacks, hallucinations, paranoia, suicidal or homicidal ideation    Past Medical History  Diagnosis Date  . Diabetes mellitus   . Neuropathy   . Narcotic addiction     History reviewed. No pertinent past surgical history.  Medications:  HOME MEDS: Prior to Admission medications   Medication Sig Start Date End Date Taking? Authorizing Provider  amoxicillin (AMOXIL) 500 MG capsule Take 1 capsule (500 mg total) by mouth 3 (three) times daily. 01/21/13   Benny Lennert, MD  Chlorpheniramine-DM (COUGH & COLD PO) Take 1-2 tablets by mouth daily as needed (FOR COUGH AND COLD SYMPTOMS).    Historical Provider, MD  insulin detemir (LEVEMIR) 100 UNIT/ML injection Inject 22 Units into the skin every morning.     Historical Provider, MD  insulin lispro (HUMALOG) 100 UNIT/ML injection Inject 1-20 Units into the skin 3 (three) times daily before meals. For levels between 90-150, inject 10 units. Sliding scales increases at 151 to 11 units and for every increase of 50 units patient injects 1 additional  unit. If levels increase over 400, call MD.    Historical Provider, MD  ondansetron (ZOFRAN) 4 MG tablet Take 1 tablet (4 mg total) by mouth every 8 (eight) hours as needed for nausea. 11/24/12   Erick Blinks, MD     Allergies:  No Known Allergies  Social History:   reports that he has been smoking Cigarettes.  He has a 25.5 pack-year smoking history. He does not have any smokeless tobacco history on file. He reports  that he does not drink alcohol or use illicit drugs.  Family History: History reviewed. No pertinent family history.   Physical Exam: Filed Vitals:   04/02/13 2249 04/03/13 0045 04/03/13 0100  BP: 147/85 138/85 142/89  Pulse: 106 98   Temp: 97.4 F (36.3 C)  98.3 F (36.8 C)  TempSrc: Oral  Oral  Resp: 22 24 19   Height: 6' (1.829 m)  6' (1.829 m)  Weight: 72.576 kg (160 lb)  74.3 kg (163 lb 12.8 oz)  SpO2: 98% 99% 99%   Blood pressure 142/89, pulse 98, temperature 98.3 F (36.8 C), temperature source Oral, resp. rate 19, height 6' (1.829 m), weight 74.3 kg (163 lb 12.8 oz), SpO2 99.00%.  GEN:  Pleasant young Caucasian man lying bed in no acute distress; cooperative with exam PSYCH:  alert and oriented x4;  neither anxious or depressed; affect is appropriate. HEENT: Mucous membranes pink and anicteric; PERRLA; EOM intact; no cervical lymphadenopathy nor thyromegaly or carotid bruit; no JVD; Breasts:: Not examined CHEST WALL: No tenderness CHEST: Normal respiration, clear to auscultation bilaterally HEART: Regular rate and rhythm; no murmurs rubs or gallops BACK: No kyphosis no scoliosis; no CVA tenderness ABDOMEN: Scaphoid soft non-tender; no masses, no organomegaly, normal abdominal bowel sounds; no pannus; no intertriginous candida. Rectal Exam: Not done EXTREMITIES: No bone or joint deformity; age-appropriate arthropathy of the hands and knees; no edema; no ulcerations. Genitalia: not examined PULSES: 2+ and symmetric SKIN: Normal hydration no rash or ulceration CNS: Cranial nerves 2-12 grossly intact no focal lateralizing neurologic deficit   Labs on Admission:  Basic Metabolic Panel:  Recent Labs Lab 04/02/13 2315  NA 127*  K 4.9  CL 89*  CO2 23  GLUCOSE 957*  BUN 19  CREATININE 0.89  CALCIUM 9.0   Liver Function Tests:  Recent Labs Lab 04/02/13 2315  AST 17  ALT 17  ALKPHOS 98  BILITOT 0.2*  PROT 7.5  ALBUMIN 3.9   No results found for this  basename: LIPASE, AMYLASE,  in the last 168 hours No results found for this basename: AMMONIA,  in the last 168 hours CBC:  Recent Labs Lab 04/02/13 2315  WBC 9.9  HGB 12.6*  HCT 40.7  MCV 88.1  PLT 302   Cardiac Enzymes: No results found for this basename: CKTOTAL, CKMB, CKMBINDEX, TROPONINI,  in the last 168 hours BNP: No components found with this basename: POCBNP,  D-dimer: No components found with this basename: D-DIMER,  CBG:  Recent Labs Lab 04/02/13 2256 04/02/13 2356 04/03/13 0111 04/03/13 0207  GLUCAP >600* >600* >600* >600*    Radiological Exams on Admission: No results found.     Assessment/Plan Present on Admission:   . DKA, type 1  Is only mildly acidotic, and anion gap is only slightly elevated; there is trace ketones in his urine. Nevertheless we'll start him on a DKA protocol since he is likely to deteriorate rapidly without aggressive treatment. We'll keep him n.p.o. until blood sugar normalizes.    Marland Kitchen  Tobacco abuse  He objects to counseling, and he insists he will continue smoking until the day he dies. Will offer nicotine patch as necessary.  . Opiate dependence  He freely admits this use of street drugs, "to prevent himself from having pains" and opines that I could save him a lot of money by simply prescribing them for him. He is declining any treatment program. He has been advised that he will not be getting any narcotics while in hospital.  It is likely that he allows his blood sugars to become elevated in order to get inpatient narcotics.his description of how his blood sugar comes to the elevated does not sound plausible. l  . Chronic pain  After discussion with him its seems more likely that his pain is mostly a result of chronic narcotic use, and possibly some diabetic neuropathy; he has declined neuropathic treatments such as Neurontin.  He reports loose stool for at least 2 days, but this is likely the results of narcotic withdrawal,  since he has not worked for a week, and may be running short of money to buy his Roxicodone. If diarrhea continues in hospital will do stool evaluation.  PLAN:   Other plans as per orders.  Code Status: He insists he does not want any resuscitative measures .DNR  Family Communication: Plans discussed with patient at bedside; his father Navraj is next of kin Disposition Plan: Likely home in a day or less  Critical care time: 60 minutes.   Sparrow [place] Nocturnist [facility] Pager [phone]   04/03/2013, 2:25 AM

## 2013-04-03 NOTE — Discharge Summary (Signed)
[facility]                                                                                   Tyler Mann, is a 32 y.o. male  DOB 03-13-1981  MRN [id number].  Admission date:  04/02/2013  Discharge Date:  04/03/2013  Primary MD  Catalina Pizza, MD  Admitting Physician  Vania Rea, MD  Admission Diagnosis  DKA, type 1 [250.11]  Discharge Diagnosis     Active Problems:   Diabetes mellitus type 1   Chronic pain   DKA, type 1   Opiate dependence   Tobacco abuse    Past Medical History  Diagnosis Date  . Diabetes mellitus   . Neuropathy   . Narcotic addiction     History reviewed. No pertinent past surgical history.   Recommendations for primary care physician for things to follow:       Discharge Diagnoses:   Active Problems:   Diabetes mellitus type 1   Chronic pain   DKA, type 1   Opiate dependence   Tobacco abuse    Discharge Condition: Stable   Diet recommendation: See Discharge Instructions below   Consults S work    History of present illness and  Hospital Course:     Kindly see H&P for history of present illness and admission details, please review complete Labs, Consult reports and Test reports for all details in brief Tyler Mann, is a 32 y.o. male, patient with history of allergies mellitus type I, chronic narcotic abuse, chronic pain, tobacco abuse who was counseled to quit all recreational narcotics to stop smoking. Was admitted to the hospital for mild DKA which resolved within a few hours of insulin administration to IV drip, he is clinically stable, he denies missing any home insulin dose, he is nontoxic appearance, stable vital signs, his anion gap closed last night, will be discharged home on his home insulin regimen, has been counseled to check his blood sugars q. a.c. at bedtime and to be in close touch and followup with his primary care physician within a few days days post discharge. He will be seen by social worker  prior to his discharge for narcotic abuse counseling.      Today   Subjective:   Tyler Mann today has no headache,no chest abdominal pain,chronic body aches, no new weakness tingling or numbness, feels much better wants to go home today.   Objective:   Blood pressure 112/62, pulse 73, temperature 98 F (36.7 C), temperature source Oral, resp. rate 19, height 6' (1.829 m), weight 74.3 kg (163 lb 12.8 oz), SpO2 97.00%.   Intake/Output Summary (Last 24 hours) at 04/03/13 0754 Last data filed at 04/03/13 0600  Gross per 24 hour  Intake  896.8 ml  Output   1900 ml  Net -1003.2 ml    Exam Awake Alert, Oriented *3, No new F.N deficits, Normal affect [place].AT,PERRAL Supple Neck,No JVD, No cervical lymphadenopathy appriciated.  Symmetrical Chest wall movement, Good air movement bilaterally, CTAB RRR,No Gallops,Rubs or new Murmurs, No Parasternal Heave +ve B.Sounds, Abd Soft, Non tender, No organomegaly appriciated, No rebound -guarding or rigidity. No Cyanosis, Clubbing or edema,  No new Rash or bruise  Data Review   Major procedures and Radiology Reports - PLEASE review detailed and final reports for all details in brief -      No results found.  Micro Results     Recent Results (from the past 240 hour(s))  MRSA PCR SCREENING     Status: None   Collection Time    04/03/13 12:50 AM      Result Value Range Status   MRSA by PCR NEGATIVE  NEGATIVE Final   Comment:            The GeneXpert MRSA Assay (FDA     approved for NASAL specimens     only), is one component of a     comprehensive MRSA colonization     surveillance program. It is not     intended to diagnose MRSA     infection nor to guide or     monitor treatment for     MRSA infections.     CBC w Diff: Lab Results  Component Value Date   WBC 10.1 04/03/2013   HGB 11.6* 04/03/2013   HCT 35.9* 04/03/2013   PLT 270 04/03/2013   LYMPHOPCT 14 01/21/2013   MONOPCT 4 01/21/2013   EOSPCT 0 01/21/2013   BASOPCT 0  01/21/2013    CMP: Lab Results  Component Value Date   NA 138 04/03/2013   K 3.6 04/03/2013   CL 104 04/03/2013   CO2 25 04/03/2013   BUN 9 04/03/2013   CREATININE 0.76 04/03/2013   PROT 7.5 04/02/2013   ALBUMIN 3.9 04/02/2013   BILITOT 0.2* 04/02/2013   ALKPHOS 98 04/02/2013   AST 17 04/02/2013   ALT 17 04/02/2013  .   Discharge Instructions     Follow with Primary MD Catalina Pizza, MD in 3 days   Get CBC, CMP, checked 3 days by Primary MD and again as instructed by your Primary MD.  Accuchecks 4 times/day, Once in AM empty stomach and then before each meal. Log in all results and show them to your Prim.MD in 3 days. If any glucose reading is under 80 or above 300 call your Prim MD immidiately. Follow Low glucose instructions for glucose under 80 as instructed.    Get Medicines reviewed and adjusted.  Please request your Prim.MD to go over all Hospital Tests and Procedure/Radiological results at the follow up, please get all Hospital records sent to your Prim MD by signing hospital release before you go home.  Activity: As tolerated with Full fall precautions use walker/cane & assistance as needed   Diet:  Heart Healthy - Low Carb .  For Heart failure patients - Check your Weight same time everyday, if you gain over 2 pounds, or you develop in leg swelling, experience more shortness of breath or chest pain, call your Primary MD immediately. Follow Cardiac Low Salt Diet and 1.8 lit/day fluid restriction.  Disposition Home    If you experience worsening of your admission symptoms, develop shortness of breath, life threatening emergency, suicidal or homicidal thoughts you must seek medical attention immediately by calling 911 or calling your MD immediately  if symptoms less severe.  You Must read complete instructions/literature along with all the possible adverse reactions/side effects for all the Medicines you take and that have been prescribed to you. Take any new Medicines after you  have completely understood and accpet all the possible adverse reactions/side effects.   Do not drive and provide baby sitting services  if your were admitted for syncope or siezures until you have seen by Primary MD or a Neurologist and advised to do so again.  Do not drive when taking Pain medications.    Do not take more than prescribed Pain, Sleep and Anxiety Medications  Special Instructions: If you have smoked or chewed Tobacco  in the last 2 yrs please stop smoking, stop any regular Alcohol  and or any Recreational drug use.  Wear Seat belts while driving.    Follow-up Information   Follow up with Catalina Pizza, MD. Schedule an appointment as soon as possible for a visit in 3 days.   Contact information:    [address]  [place] [address] [phone]         Discharge Medications     Medication List    TAKE these medications       amoxicillin 500 MG capsule  Commonly known as:  AMOXIL  Take 1 capsule (500 mg total) by mouth 3 (three) times daily.     COUGH & COLD PO  Take 1-2 tablets by mouth daily as needed (FOR COUGH AND COLD SYMPTOMS).     insulin detemir 100 UNIT/ML injection  Commonly known as:  LEVEMIR  Inject 22 Units into the skin every morning.     insulin lispro 100 UNIT/ML injection  Commonly known as:  HUMALOG  Inject 1-20 Units into the skin 3 (three) times daily before meals. For levels between 90-150, inject 10 units. Sliding scales increases at 151 to 11 units and for every increase of 50 units patient injects 1 additional unit. If levels increase over 400, call MD.     ondansetron 4 MG tablet  Commonly known as:  ZOFRAN  Take 1 tablet (4 mg total) by mouth every 8 (eight) hours as needed for nausea.           Total Time in preparing paper work, data evaluation and todays exam - 35 minutes  Leroy Sea M.D on 04/03/2013 at 7:54 AM  [facility] Office  [phone]

## 2013-04-03 NOTE — Progress Notes (Signed)
Hypoglycemic Event  CBG: 50  Treatment: 4 oz orange juice, graham crackers and peanut butter  Symptoms: asymptomatic, alert and oriented  Follow-up CBG: Time:0754 CBG Result:64  Possible Reasons for Event: npo overnight  Comments/MD notified:Dr Thedore Mins notified    [place]  Remember to initiate Hypoglycemia Order Set & complete

## 2013-04-03 NOTE — ED Notes (Signed)
CRITICAL VALUE ALERT  Critical value received:  Glucose 957  Date of notification:  04/02/13  Time of notification:  2355  Critical value read back:yes   Nurse who received alert:  [email or web address]  MD notified (1st page): opitz  Time of first page:  2355  MD notified (2nd page):  Time of second page:  Responding MD:  opitz  Time MD responded:  opitz

## 2013-04-03 NOTE — Plan of Care (Signed)
Problem: Consults Goal: Diabetic Ketoacidosis (DKA) Patient Education See Patient Education Modules for education specifics. Outcome: Progressing Patient admitted with DKA, recurrent problem with this type 1 diabetic.  Last admission 09/19/12.  Patient states he knows how to care for his diabetes.  Wall barrier to any new or added teaching.  Goal: Diabetes Guidelines if Diabetic/Glucose > 140 If diabetic or lab glucose is > 140 mg/dl - Initiate Diabetes/Hyperglycemia Guidelines & Document Interventions  Outcome: Progressing Patient on Glucostabilizer for  Admission blood sugar of 950.  Problem: Phase I Progression Outcomes Goal: Acidosis resolving Outcome: Completed/Met Date Met:  04/03/13 Anion gap has closed Goal: NPO or per MD order Outcome: Completed/Met Date Met:  04/03/13 Ice  Goal: Nausea/vomiting controlled with antiemetics Outcome: Not Applicable Date Met:  04/03/13 No nausea Goal: Pain controlled with appropriate interventions Outcome: Not Progressing Patient complains of shoulder pain that radiates to his fingers and back pain that radiates to his feet.  He says it hurts really bad when his sugar goes up. Goal: Initial discharge plan identified Outcome: Progressing To home

## 2013-09-24 ENCOUNTER — Other Ambulatory Visit: Payer: Self-pay

## 2013-11-19 ENCOUNTER — Inpatient Hospital Stay (HOSPITAL_COMMUNITY)
Admission: EM | Admit: 2013-11-19 | Discharge: 2013-11-21 | DRG: 639 | Disposition: A | Discharge status: Home / Self Care | Payer: BC Managed Care – PPO | Attending: Family Medicine | Admitting: Family Medicine

## 2013-11-19 ENCOUNTER — Encounter (HOSPITAL_COMMUNITY): Payer: Self-pay | Admitting: Emergency Medicine

## 2013-11-19 DIAGNOSIS — E1065 Type 1 diabetes mellitus with hyperglycemia: Secondary | ICD-10-CM | POA: Diagnosis present

## 2013-11-19 DIAGNOSIS — E1049 Type 1 diabetes mellitus with other diabetic neurological complication: Secondary | ICD-10-CM | POA: Diagnosis present

## 2013-11-19 DIAGNOSIS — F17200 Nicotine dependence, unspecified, uncomplicated: Secondary | ICD-10-CM | POA: Diagnosis present

## 2013-11-19 DIAGNOSIS — R1110 Vomiting, unspecified: Secondary | ICD-10-CM

## 2013-11-19 DIAGNOSIS — E860 Dehydration: Secondary | ICD-10-CM

## 2013-11-19 DIAGNOSIS — Z794 Long term (current) use of insulin: Secondary | ICD-10-CM

## 2013-11-19 DIAGNOSIS — E1110 Type 2 diabetes mellitus with ketoacidosis without coma: Secondary | ICD-10-CM | POA: Diagnosis present

## 2013-11-19 DIAGNOSIS — E1010 Type 1 diabetes mellitus with ketoacidosis without coma: Secondary | ICD-10-CM

## 2013-11-19 DIAGNOSIS — E1142 Type 2 diabetes mellitus with diabetic polyneuropathy: Secondary | ICD-10-CM | POA: Diagnosis present

## 2013-11-19 DIAGNOSIS — E875 Hyperkalemia: Secondary | ICD-10-CM | POA: Diagnosis present

## 2013-11-19 LAB — GLUCOSE, CAPILLARY
GLUCOSE-CAPILLARY: 422 mg/dL — AB (ref 70–99)
GLUCOSE-CAPILLARY: 240 mg/dL — AB (ref 70–99)
Glucose-Capillary: 479 mg/dL — ABNORMAL HIGH (ref 70–99)
Glucose-Capillary: 154 mg/dL — ABNORMAL HIGH (ref 70–99)
Glucose-Capillary: 145 mg/dL — ABNORMAL HIGH (ref 70–99)

## 2013-11-19 LAB — URINALYSIS, ROUTINE W REFLEX MICROSCOPIC
Bilirubin Urine: NEGATIVE
Glucose, UA: 500 mg/dL — AB
Hgb urine dipstick: NEGATIVE
Ketones, ur: >80 mg/dL — AB
Leukocytes, UA: NEGATIVE
Nitrite: NEGATIVE
Protein, ur: NEGATIVE mg/dL
Specific Gravity, Urine: 1.025 (ref 1.005–1.030)
Urobilinogen, UA: 0.2 mg/dL (ref 0.0–1.0)
pH: 5.5 (ref 5.0–8.0)

## 2013-11-19 LAB — COMPREHENSIVE METABOLIC PANEL
ALT: 25 U/L (ref 0–53)
AST: 19 U/L (ref 0–37)
Albumin: 3.9 g/dL (ref 3.5–5.2)
Alkaline Phosphatase: 95 U/L (ref 39–117)
BUN: 14 mg/dL (ref 6–23)
CALCIUM: 9.6 mg/dL (ref 8.4–10.5)
CO2: 13 meq/L — AB (ref 19–32)
Chloride: 91 mEq/L — ABNORMAL LOW (ref 96–112)
Creatinine, Ser: 0.87 mg/dL (ref 0.50–1.35)
GFR calc non Af Amer: >90 mL/min (ref >90)
GLUCOSE: 538 mg/dL — AB (ref 70–99)
POTASSIUM: 5.5 meq/L — AB (ref 3.7–5.3)
SODIUM: 136 meq/L — AB (ref 137–147)
Total Bilirubin: 0.3 mg/dL (ref 0.3–1.2)
Total Protein: 8.2 g/dL (ref 6.0–8.3)

## 2013-11-19 LAB — BASIC METABOLIC PANEL
BUN: 14 mg/dL (ref 6–23)
BUN: 12 mg/dL (ref 6–23)
CHLORIDE: 104 meq/L (ref 96–112)
CO2: 12 mEq/L — ABNORMAL LOW (ref 19–32)
CO2: 19 mEq/L (ref 19–32)
Calcium: 8.5 mg/dL (ref 8.4–10.5)
Calcium: 8.7 mg/dL (ref 8.4–10.5)
Chloride: 101 mEq/L (ref 96–112)
Creatinine, Ser: 0.87 mg/dL (ref 0.50–1.35)
Creatinine, Ser: 0.78 mg/dL (ref 0.50–1.35)
GFR calc Af Amer: >90 mL/min (ref >90)
GFR calc non Af Amer: >90 mL/min (ref >90)
GFR calc non Af Amer: >90 mL/min (ref >90)
GLUCOSE: 355 mg/dL — AB (ref 70–99)
Glucose, Bld: 163 mg/dL — ABNORMAL HIGH (ref 70–99)
POTASSIUM: 4.3 meq/L (ref 3.7–5.3)
POTASSIUM: 4.1 meq/L (ref 3.7–5.3)
SODIUM: 141 meq/L (ref 137–147)
Sodium: 142 mEq/L (ref 137–147)

## 2013-11-19 LAB — BLOOD GAS, ARTERIAL
Acid-base deficit: 16.1 mmol/L — ABNORMAL HIGH (ref 0.0–2.0)
Bicarbonate: 9.5 mEq/L — ABNORMAL LOW (ref 20.0–24.0)
DRAWN BY: 23534
FIO2: 0.21 %
O2 Content: 21 L/min
O2 Saturation: 97.5 %
PCO2 ART: 20.7 mmHg — AB (ref 35.0–45.0)
PH ART: 7.283 — AB (ref 7.350–7.450)
PO2 ART: 113 mmHg — AB (ref 80.0–100.0)
Patient temperature: 37
TCO2: 8.7 mmol/L (ref 0–100)

## 2013-11-19 LAB — BASIC METABOLIC PANEL WITH GFR
BUN: 13 mg/dL (ref 6–23)
CO2: 16 meq/L — ABNORMAL LOW (ref 19–32)
Calcium: 9.2 mg/dL (ref 8.4–10.5)
Chloride: 101 meq/L (ref 96–112)
Creatinine, Ser: 0.85 mg/dL (ref 0.50–1.35)
GFR calc Af Amer: >90 mL/min
GFR calc non Af Amer: >90 mL/min
Glucose, Bld: 273 mg/dL — ABNORMAL HIGH (ref 70–99)
Potassium: 4.4 meq/L (ref 3.7–5.3)
Sodium: 141 meq/L (ref 137–147)

## 2013-11-19 LAB — CBC WITH DIFFERENTIAL/PLATELET
Basophils Absolute: 0 K/uL (ref 0.0–0.1)
Basophils Relative: 0 % (ref 0–1)
Eosinophils Absolute: 0.1 K/uL (ref 0.0–0.7)
Eosinophils Relative: 1 % (ref 0–5)
HCT: 45.3 % (ref 39.0–52.0)
Hemoglobin: 14.5 g/dL (ref 13.0–17.0)
Lymphocytes Relative: 21 % (ref 12–46)
Lymphs Abs: 2.1 K/uL (ref 0.7–4.0)
MCH: 28 pg (ref 26.0–34.0)
MCHC: 32 g/dL (ref 30.0–36.0)
MCV: 87.5 fL (ref 78.0–100.0)
Monocytes Absolute: 0.3 K/uL (ref 0.1–1.0)
Monocytes Relative: 3 % (ref 3–12)
Neutro Abs: 7.4 K/uL (ref 1.7–7.7)
Neutrophils Relative %: 74 % (ref 43–77)
Platelets: 409 K/uL — ABNORMAL HIGH (ref 150–400)
RBC: 5.18 MIL/uL (ref 4.22–5.81)
RDW: 14.3 % (ref 11.5–15.5)
WBC: 10 K/uL (ref 4.0–10.5)

## 2013-11-19 LAB — LACTIC ACID, PLASMA: Lactic Acid, Venous: 1.2 mmol/L (ref 0.5–2.2)

## 2013-11-19 MED ORDER — INSULIN ASPART 100 UNIT/ML ~~LOC~~ SOLN
0.0000 [IU] | Freq: Three times a day (TID) | SUBCUTANEOUS | Status: DC
  Administered 2013-11-20: 15 [IU] via SUBCUTANEOUS
  Administered 2013-11-20: 2 [IU] via SUBCUTANEOUS
  Administered 2013-11-21: 3 [IU] via SUBCUTANEOUS
  Administered 2013-11-21: 5 [IU] via SUBCUTANEOUS

## 2013-11-19 MED ORDER — SODIUM CHLORIDE 0.9 % IV SOLN
INTRAVENOUS | Status: DC
  Administered 2013-11-19: 18:00:00 via INTRAVENOUS

## 2013-11-19 MED ORDER — INSULIN DETEMIR 100 UNIT/ML ~~LOC~~ SOLN
5.0000 [IU] | Freq: Every day | SUBCUTANEOUS | Status: DC
  Administered 2013-11-19: 5 [IU] via SUBCUTANEOUS
  Filled 2013-11-19 (×4): qty 0.05

## 2013-11-19 MED ORDER — SODIUM CHLORIDE 0.9 % IV SOLN
INTRAVENOUS | Status: DC
  Administered 2013-11-19: 4.2 [IU]/h via INTRAVENOUS
  Administered 2013-11-20: 2.1 [IU]/h via INTRAVENOUS
  Filled 2013-11-19: qty 1

## 2013-11-19 MED ORDER — ALPRAZOLAM 0.25 MG PO TABS
0.2500 mg | ORAL_TABLET | Freq: Two times a day (BID) | ORAL | Status: DC
  Administered 2013-11-19 – 2013-11-21 (×4): 0.25 mg via ORAL
  Filled 2013-11-19 (×4): qty 1

## 2013-11-19 MED ORDER — CALCIUM GLUCONATE 10 % IV SOLN
1.0000 g | Freq: Once | INTRAVENOUS | Status: AC
  Administered 2013-11-19: 1 g via INTRAVENOUS
  Filled 2013-11-19: qty 10

## 2013-11-19 MED ORDER — DEXTROSE 50 % IV SOLN: 25.0000 mL | INTRAVENOUS | Status: DC | PRN

## 2013-11-19 MED ORDER — HEPARIN SODIUM (PORCINE) 5000 UNIT/ML IJ SOLN
5000.0000 [IU] | Freq: Three times a day (TID) | INTRAMUSCULAR | Status: DC
  Administered 2013-11-19 – 2013-11-20 (×4): 5000 [IU] via SUBCUTANEOUS
  Filled 2013-11-19 (×5): qty 1

## 2013-11-19 MED ORDER — SODIUM POLYSTYRENE SULFONATE 15 GM/60ML PO SUSP
15.0000 g | Freq: Once | ORAL | Status: AC
  Administered 2013-11-19: 15 g via ORAL
  Filled 2013-11-19: qty 60

## 2013-11-19 MED ORDER — ONDANSETRON HCL 4 MG/2ML IJ SOLN: 4.0000 mg | Freq: Three times a day (TID) | INTRAMUSCULAR | Status: AC | PRN

## 2013-11-19 MED ORDER — SODIUM CHLORIDE 0.9 % IV SOLN: INTRAVENOUS | Status: AC

## 2013-11-19 MED ORDER — INSULIN ASPART 100 UNIT/ML ~~LOC~~ SOLN
3.0000 [IU] | Freq: Three times a day (TID) | SUBCUTANEOUS | Status: DC
  Administered 2013-11-20 – 2013-11-21 (×4): 3 [IU] via SUBCUTANEOUS

## 2013-11-19 MED ORDER — SODIUM CHLORIDE 0.9 % IV SOLN: INTRAVENOUS | Status: DC

## 2013-11-19 MED ORDER — DEXTROSE-NACL 5-0.45 % IV SOLN
INTRAVENOUS | Status: DC
  Administered 2013-11-19 – 2013-11-20 (×2): via INTRAVENOUS

## 2013-11-19 MED ORDER — SODIUM CHLORIDE 0.9 % IV BOLUS (SEPSIS)
1000.0000 mL | Freq: Once | INTRAVENOUS | Status: AC
  Administered 2013-11-19: 1000 mL via INTRAVENOUS

## 2013-11-19 MED ORDER — SODIUM CHLORIDE 0.9 % IV SOLN
INTRAVENOUS | Status: DC
  Administered 2013-11-20: 1.9 [IU]/h via INTRAVENOUS
  Filled 2013-11-19: qty 1

## 2013-11-19 MED ORDER — MILNACIPRAN HCL 50 MG PO TABS
50.0000 mg | ORAL_TABLET | Freq: Two times a day (BID) | ORAL | Status: DC
  Administered 2013-11-19 – 2013-11-21 (×4): 50 mg via ORAL
  Filled 2013-11-19 (×8): qty 1

## 2013-11-19 MED ORDER — POTASSIUM CHLORIDE 10 MEQ/100ML IV SOLN
10.0000 meq | INTRAVENOUS | Status: AC
  Administered 2013-11-19 (×2): 10 meq via INTRAVENOUS
  Filled 2013-11-19 (×2): qty 100

## 2013-11-19 NOTE — ED Provider Notes (Signed)
CSN: [id number]     Arrival date & time 11/19/13  1424 History  This chart was scribed for Enid Skeens, MD by Bennett Scrape, ED Scribe. This patient was seen in room APA11/APA11 and the patient's care was started at 3:18 PM.   Chief Complaint  Patient presents with  . Hyperglycemia    The history is provided by the patient. No language interpreter was used.    HPI Comments: Tyler Mann is a 32 y.o. male with a h/o Type 1 DM for the past 20 years who presents to the Emergency Department complaining of 4 days of constant, non-changing hyperglycemia that he believes to be in the 1000s. He is on Levamir 25 units x1 a day and a sliding scale of Humalog. He denies any missed doses. He denies any recent missed meals. He states that he changed out the insulin believing the medication to be bad with no improvement. He states that he has also felt dehydrated and had "off the chart" ketones. He admits to having prior DKA diagnoses with subequent admissions and feels that the symptoms are similar. He reports that he has delayed treatment, because he hates to be admitted. He also reports nausea, emesis, myalgias and abdominal discomfort from vomiting within the past few days. He denies any cough, sore throat, fevers, CP, neck pain. He denies any alcohol use.   Past Medical History  Diagnosis Date  . Diabetes mellitus   . Neuropathy   . Narcotic addiction    History reviewed. No pertinent past surgical history. History reviewed. No pertinent family history. History  Substance Use Topics  . Smoking status: Current Every Day Smoker -- 1.50 packs/day for 17 years    Types: Cigarettes  . Smokeless tobacco: Not on file  . Alcohol Use: No    Review of Systems  Constitutional: Positive for fatigue. Negative for fever and chills.  HENT: Negative for ear pain.   Respiratory: Negative for cough and shortness of breath.   Cardiovascular: Negative for chest pain and palpitations.  Gastrointestinal:  Positive for nausea, vomiting and abdominal pain (discomfort). Negative for diarrhea.  Genitourinary: Positive for decreased urine volume. Negative for dysuria.  Musculoskeletal: Positive for back pain (mild lower) and myalgias. Negative for neck pain.  Skin: Negative for rash.  All other systems reviewed and are negative.    Allergies  Review of patient's allergies indicates no known allergies.  Home Medications   Current Outpatient Rx  Name  Route  Sig  Dispense  Refill  . insulin detemir (LEVEMIR) 100 UNIT/ML injection   Subcutaneous   Inject 22 Units into the skin every morning.          . insulin lispro (HUMALOG) 100 UNIT/ML injection   Subcutaneous   Inject 1-20 Units into the skin 3 (three) times daily before meals. For levels between 90-150, inject 10 units. Sliding scales increases at 151 to 11 units and for every increase of 50 units patient injects 1 additional unit. If levels increase over 400, call MD.         . ondansetron (ZOFRAN) 4 MG tablet   Oral   Take 1 tablet (4 mg total) by mouth every 8 (eight) hours as needed for nausea.   20 tablet   0    Triage vitals: BP 124/99  Pulse 119  Resp 18  Ht 6' (1.829 m)  Wt 180 lb (81.647 kg)  BMI 24.41 kg/m2  SpO2 100%  Physical Exam  Nursing note and vitals  reviewed. Constitutional: He is oriented to person, place, and time. He appears well-developed and well-nourished. No distress.  HENT:  Head: Normocephalic and atraumatic.  Dry MM  Eyes: Conjunctivae and EOM are normal. Pupils are equal, round, and reactive to light. No scleral icterus.  Neck: Normal range of motion. Neck supple. No tracheal deviation present.  Cardiovascular: Regular rhythm.  Exam reveals no gallop and no friction rub.   No murmur heard. Mild tachycardia  Pulmonary/Chest: Effort normal and breath sounds normal. No respiratory distress. He has no wheezes. He has no rales.  Abdominal: Soft. There is no tenderness (no focal tenderness ).  There is no guarding.  Musculoskeletal: Normal range of motion. He exhibits no edema (no leg swelling).  Moves all extremities appropriately   Lymphadenopathy:    He has no cervical adenopathy.  Neurological: He is alert and oriented to person, place, and time. No cranial nerve deficit.  Generalized weakness   Skin: Skin is warm and dry.  Chronic skin lesions to the lower extremities, no signs of cellulitis   Psychiatric: He has a normal mood and affect. His behavior is normal.    ED Course  Procedures (including critical care time)  Medications  insulin regular (NOVOLIN R,HUMULIN R) 1 Units/mL in sodium chloride 0.9 % 100 mL infusion (1.9 Units/hr Intravenous Rate/Dose Change 11/19/13 2207)  ondansetron (ZOFRAN) injection 4 mg (not administered)  0.9 %  sodium chloride infusion ( Intravenous Canceled Entry 11/19/13 1951)  Milnacipran (SAVELLA) tablet TABS 50 mg (50 mg Oral Given 11/19/13 2053)  ALPRAZolam (XANAX) tablet 0.25 mg (0.25 mg Oral Given 11/19/13 2053)  0.9 %  sodium chloride infusion ( Intravenous Canceled Entry 11/19/13 1800)  dextrose 5 %-0.45 % sodium chloride infusion ( Intravenous New Bag/Given 11/19/13 2050)  insulin regular (NOVOLIN R,HUMULIN R) 1 Units/mL in sodium chloride 0.9 % 100 mL infusion ( Intravenous Canceled Entry 11/19/13 1800)  dextrose 50 % solution 25 mL (not administered)  heparin injection 5,000 Units (5,000 Units Subcutaneous Given 11/19/13 2054)  0.9 %  sodium chloride infusion ( Intravenous Stopped 11/19/13 2050)  insulin detemir (LEVEMIR) injection 5 Units (5 Units Subcutaneous Given 11/19/13 2054)  insulin aspart (novoLOG) injection 0-15 Units (not administered)  insulin aspart (novoLOG) injection 3 Units (not administered)  sodium chloride 0.9 % bolus 1,000 mL (0 mLs Intravenous Stopped 11/19/13 1639)  sodium chloride 0.9 % bolus 1,000 mL (1,000 mLs Intravenous Transfusing/Transfer 11/19/13 1736)  calcium gluconate 1 g in sodium chloride 0.9 % 100 mL IVPB (1 g  Intravenous Given 11/19/13 1823)  sodium polystyrene (KAYEXALATE) 15 GM/60ML suspension 15 g (15 g Oral Given 11/19/13 1824)  potassium chloride 10 mEq in 100 mL IVPB (10 mEq Intravenous Given 11/19/13 2145)    DIAGNOSTIC STUDIES: Oxygen Saturation is 100% on room air, normal by my interpretation.    COORDINATION OF CARE: 3:22 PM-Discussed treatment plan which includes IV fluids, CBC, CMP and UA with pt at bedside and pt agreed to plan.   Labs Review Labs Reviewed  GLUCOSE, CAPILLARY - Abnormal; Notable for the following:    Glucose-Capillary 422 (*)    All other components within normal limits  CBC WITH DIFFERENTIAL - Abnormal; Notable for the following:    Platelets 409 (*)    All other components within normal limits  COMPREHENSIVE METABOLIC PANEL - Abnormal; Notable for the following:    Sodium 136 (*)    Potassium 5.5 (*)    Chloride 91 (*)    CO2 13 (*)  Glucose, Bld 538 (*)    All other components within normal limits  URINALYSIS, ROUTINE W REFLEX MICROSCOPIC - Abnormal; Notable for the following:    Glucose, UA 500 (*)    Ketones, ur >80 (*)    All other components within normal limits  BLOOD GAS, ARTERIAL - Abnormal; Notable for the following:    pH, Arterial 7.283 (*)    pCO2 arterial 20.7 (*)    pO2, Arterial 113.0 (*)    Bicarbonate 9.5 (*)    Acid-base deficit 16.1 (*)    All other components within normal limits  BASIC METABOLIC PANEL - Abnormal; Notable for the following:    CO2 16 (*)    Glucose, Bld 273 (*)    All other components within normal limits  GLUCOSE, CAPILLARY - Abnormal; Notable for the following:    Glucose-Capillary 479 (*)    All other components within normal limits  BASIC METABOLIC PANEL - Abnormal; Notable for the following:    CO2 12 (*)    Glucose, Bld 355 (*)    All other components within normal limits  BASIC METABOLIC PANEL - Abnormal; Notable for the following:    Glucose, Bld 163 (*)    All other components within normal limits   GLUCOSE, CAPILLARY - Abnormal; Notable for the following:    Glucose-Capillary 240 (*)    All other components within normal limits  GLUCOSE, CAPILLARY - Abnormal; Notable for the following:    Glucose-Capillary 154 (*)    All other components within normal limits  LACTIC ACID, PLASMA  BASIC METABOLIC PANEL   Imaging Review No results found.  EKG Interpretation    Date/Time:  Thursday November 19 2013 17:08:52 EST Ventricular Rate:  107 PR Interval:  126 QRS Duration: 90 QT Interval:  340 QTC Calculation: 453 R Axis:   84 Text Interpretation:  Sinus tachycardia with Premature supraventricular complexes Otherwise normal ECG When compared with ECG of 03-Apr-2013 04:09, Premature supraventricular complexes are now Present Confirmed by Eliyas Suddreth  MD, Macguire Holsinger ([id number]) on 11/19/2013 10:58:44 PM            MDM   1. DKA, type 1   2. Vomiting   3. Dehydration    I personally performed the services described in this documentation, which was scribed in my presence. The recorded information has been reviewed and is accurate.  Recurrent DKA.  Fluid boluses in ED.  Pt neuro intact. No signs of infection on exam. Spoke with [facility], [facility].  Insulin drip started in ED.  The patients results and plan were reviewed and discussed.   Any x-rays performed were personally reviewed by myself.   Differential diagnosis were considered with the presenting HPI.   EKG: reviewed  Admission/ observation were discussed with the admitting physician, patient and/or family and they are comfortable with the plan.     Enid SkeensJoshua M Dakotah Orrego, MD 11/19/13 [phone]

## 2013-11-19 NOTE — ED Notes (Signed)
States bs has been over 600 x 2 days. Pt alert/oriented. Nad. No weakness noted. Pt noncompliant. States just feels bad. Has been having some n/v but denies at present. Pain to lower back/legs and hand that is "normal" when his sugar is high

## 2013-11-19 NOTE — H&P (Signed)
[facility] History and Physical  CAELUM FEDERICI WUJ:[id number] DOB: 09-22-1981 DOA: 11/19/2013  Referring physician: Jodi Mourning, ED-P PCP: Catalina Pizza, MD  Specialists:   Chief Complaint: DKA  HPI: Tyler Mann is a 33 y.o. male, known type I diabetic since age 63 with complications of severe peripheral neuropathy came to [facility] ed 11/19/2013 with 3 to four-day history of feeling unwell with nausea, abdominal pain, vomiting. Cannot quantify exactly how much he vomited. Nonbloody emesis. Abdominal pain significant and central abdomen. No fever no chills no blurred vision no double vision however polydipsia. Has been taking his regular insulin at home and has tried to increase the dosage to avoid hospital admission because "I'm always admitted and a stay for a week in the ICU". Denies ill contacts can denies diarrhea denies febrile illness Has not been eating since onset of symptoms. Usually he states he does pretty well at home with management of his diabetes He measures his diabetes at home and states that the meter has said "over thousand"  At bedside he asks me for "something stronger for his pain" other than Lyrica or gabapentin which do not seem to help him. He has been trialed on Savella which seems to help a little and in his past medical history and not that he has a problem of narcotic addiction  On admission, anion gap 34, pH 7.2 Potassium 5.5  Random blood sugar 538 Lactic acid 1.2 Specific gravity 1.010, urinary glucose of 1000, trace ketones, positive acetone EKG = sinus tachycardia PR interval 0 point 08, QRS axis = 90 almost, peak T waves in lead V2, V3 3, BP 4 with some scalloping ST segment likely rate related.   Review of Systems: The patient denies other symptoms other than mentioned   Past Medical History  Diagnosis Date  . Diabetes mellitus   . Neuropathy   . Narcotic addiction    History reviewed. No pertinent past surgical history. Social History:  History    Social History Narrative  . No narrative on file    No Known Allergies  History reviewed. No pertinent family history. patient states no one in his family has diabetes or any other medical illnesses from his knowledge He lives at home with his parents He is not currently employed He denies drug use  Prior to Admission medications   Medication Sig Start Date End Date Taking? Authorizing Provider  ALPRAZolam (XANAX) 0.25 MG tablet Take 0.25 mg by mouth 2 (two) times daily.   Yes Historical Provider, MD  cyclobenzaprine (FLEXERIL) 10 MG tablet Take 20 mg by mouth daily.   Yes Historical Provider, MD  insulin detemir (LEVEMIR) 100 UNIT/ML injection Inject 25 Units into the skin at bedtime.    Yes Historical Provider, MD  insulin lispro (HUMALOG) 100 UNIT/ML injection Inject 1-20 Units into the skin 3 (three) times daily before meals. For levels between 90-150, inject 10 units. Sliding scales increases at 151 to 11 units and for every increase of 50 units patient injects 1 additional unit. If levels increase over 400, call MD.   Yes Historical Provider, MD  Milnacipran (SAVELLA) 50 MG TABS tablet Take 50 mg by mouth 2 (two) times daily.   Yes Historical Provider, MD  Vitamin D, Ergocalciferol, (DRISDOL) 50000 UNITS CAPS capsule Take 50,000 Units by mouth every 7 (seven) days. Usually takes on Sunday   Yes Historical Provider, MD   Physical Exam: Filed Vitals:   11/19/13 1430 11/19/13 1726  BP: 124/99   Pulse: 119  Temp:  97.8 F (36.6 C)  TempSrc:  Oral  Resp: 18   Height: 6' (1.829 m)   Weight: 81.647 kg (180 lb)   SpO2: 100%      General:  EOMI, deep sighing respirations however alert oriented able to speak to me clearly  Eyes: EOMI, no pallor no icterus  ENT: No throat lesion or swelling or erythema, TMs are negative  Neck: Soft supple neck veins flat  Cardiovascular: S1-S2 tachycardic 100 range regular rate rhythm  Respiratory: Sighing respirations, does not appear  however in significant distress  Abdomen: Slightly tender in epigastrium  Skin: There is areas on the hand and leg with scabs however no open lesion or concern for cellulitis  Musculoskeletal: Range of motion intact  Psychiatric: Flat affect  Neurologic: Grossly intact moving all 4 limbs equally no noted deficit grossly  Labs on Admission:  Basic Metabolic Panel:  Recent Labs Lab 11/19/13 1554  NA 136*  K 5.5*  CL 91*  CO2 13*  GLUCOSE 538*  BUN 14  CREATININE 0.87  CALCIUM 9.6   Liver Function Tests:  Recent Labs Lab 11/19/13 1554  AST 19  ALT 25  ALKPHOS 95  BILITOT 0.3  PROT 8.2  ALBUMIN 3.9   No results found for this basename: LIPASE, AMYLASE,  in the last 168 hours No results found for this basename: AMMONIA,  in the last 168 hours CBC:  Recent Labs Lab 11/19/13 1554  WBC 10.0  NEUTROABS 7.4  HGB 14.5  HCT 45.3  MCV 87.5  PLT 409*   Cardiac Enzymes: No results found for this basename: CKTOTAL, CKMB, CKMBINDEX, TROPONINI,  in the last 168 hours  BNP (last 3 results) No results found for this basename: PROBNP,  in the last 8760 hours CBG:  Recent Labs Lab 11/19/13 1428 11/19/13 1722  GLUCAP 422* 479*    Radiological Exams on Admission: No results found.  EKG: Independently reviewed. As above  Assessment/Plan Active Problems:   DKA (diabetic ketoacidoses)-moderate to severe, given biochemical parameters. Will start on DKA protocol and will monitor blood sugars q. one hourly initially and then every 2 hourly until anion gap closes. Patient should remain on insulin drip until Is completely closed. He'll require monitoring with basic metabolic panel every 4 hourly, he will be kept n.p.o. and on transition to regular insulin once his sugar is below 250, will require transition to insulin coverage which has already been placed in the orders.   Diabetic polyneuropathy-I have encouraged him to reach out to his endocrinologist for further options  since gabapentin/Lyrica did not help him   History of tatoo-would consider checking hepatitis panels and titers on him as an out patient. He also states he has used drugs in the past   Hyperkalemia in the setting of acidosis-given calcium gluconate 1 g in the emergency room, Kayexalate 15 milligrams ordered. Expect that with correction of acidosis, and it should will occur and patient will correct hyperkalemia.   None if consultant consulted, please document name and whether formally or informally consulted  Code Status:  full (must indicate code status--if unknown or must be presumed, indicate so) Family Communication:  none at bedside (indicate person spoken with, if applicable, with phone number if by telephone) Disposition Plan:  ICU, expect 2-3 days at least (indicate anticipated LOS)  Time spent:  45  Mohab Ashby, [facility]boroJAI-GURMUKH [facility] Pager [phone]  If 7PM-7AM, please contact night-coverage [email or web address] Password [email or web address] 11/19/2013, 5:32 PM

## 2013-11-20 ENCOUNTER — Encounter (HOSPITAL_COMMUNITY): Payer: Self-pay | Admitting: *Deleted

## 2013-11-20 LAB — BASIC METABOLIC PANEL WITH GFR
BUN: 11 mg/dL (ref 6–23)
CO2: 20 meq/L (ref 19–32)
Calcium: 8.5 mg/dL (ref 8.4–10.5)
Chloride: 104 meq/L (ref 96–112)
Creatinine, Ser: 0.72 mg/dL (ref 0.50–1.35)
GFR calc Af Amer: >90 mL/min
GFR calc non Af Amer: >90 mL/min
Glucose, Bld: 154 mg/dL — ABNORMAL HIGH (ref 70–99)
Potassium: 4.1 meq/L (ref 3.7–5.3)
Sodium: 142 meq/L (ref 137–147)

## 2013-11-20 LAB — BASIC METABOLIC PANEL
BUN: 9 mg/dL (ref 6–23)
BUN: 7 mg/dL (ref 6–23)
CHLORIDE: 101 meq/L (ref 96–112)
CO2: 19 mEq/L (ref 19–32)
CO2: 25 meq/L (ref 19–32)
CREATININE: 0.7 mg/dL (ref 0.50–1.35)
Calcium: 8.3 mg/dL — ABNORMAL LOW (ref 8.4–10.5)
Calcium: 8.7 mg/dL (ref 8.4–10.5)
Chloride: 103 mEq/L (ref 96–112)
Creatinine, Ser: 0.73 mg/dL (ref 0.50–1.35)
GFR calc Af Amer: >90 mL/min (ref >90)
GFR calc non Af Amer: >90 mL/min (ref >90)
GLUCOSE: 367 mg/dL — AB (ref 70–99)
Glucose, Bld: 154 mg/dL — ABNORMAL HIGH (ref 70–99)
POTASSIUM: 4.5 meq/L (ref 3.7–5.3)
POTASSIUM: 4.1 meq/L (ref 3.7–5.3)
Sodium: 140 mEq/L (ref 137–147)
Sodium: 136 mEq/L — ABNORMAL LOW (ref 137–147)

## 2013-11-20 LAB — GLUCOSE, CAPILLARY
GLUCOSE-CAPILLARY: 133 mg/dL — AB (ref 70–99)
GLUCOSE-CAPILLARY: 367 mg/dL — AB (ref 70–99)
GLUCOSE-CAPILLARY: 275 mg/dL — AB (ref 70–99)
Glucose-Capillary: 131 mg/dL — ABNORMAL HIGH (ref 70–99)
Glucose-Capillary: 135 mg/dL — ABNORMAL HIGH (ref 70–99)
Glucose-Capillary: 142 mg/dL — ABNORMAL HIGH (ref 70–99)
Glucose-Capillary: 141 mg/dL — ABNORMAL HIGH (ref 70–99)
Glucose-Capillary: 335 mg/dL — ABNORMAL HIGH (ref 70–99)
Glucose-Capillary: 181 mg/dL — ABNORMAL HIGH (ref 70–99)
Glucose-Capillary: 185 mg/dL — ABNORMAL HIGH (ref 70–99)
Glucose-Capillary: 149 mg/dL — ABNORMAL HIGH (ref 70–99)
Glucose-Capillary: 165 mg/dL — ABNORMAL HIGH (ref 70–99)
Glucose-Capillary: 159 mg/dL — ABNORMAL HIGH (ref 70–99)
Glucose-Capillary: 148 mg/dL — ABNORMAL HIGH (ref 70–99)
Glucose-Capillary: 154 mg/dL — ABNORMAL HIGH (ref 70–99)

## 2013-11-20 MED ORDER — POTASSIUM CHLORIDE 10 MEQ/100ML IV SOLN
10.0000 meq | INTRAVENOUS | Status: AC
  Administered 2013-11-20 (×2): 10 meq via INTRAVENOUS

## 2013-11-20 MED ORDER — CYCLOBENZAPRINE HCL 10 MG PO TABS
10.0000 mg | ORAL_TABLET | Freq: Two times a day (BID) | ORAL | Status: DC | PRN
  Administered 2013-11-20 – 2013-11-21 (×2): 10 mg via ORAL
  Filled 2013-11-20 (×2): qty 1

## 2013-11-20 MED ORDER — INSULIN DETEMIR 100 UNIT/ML ~~LOC~~ SOLN
25.0000 [IU] | Freq: Every day | SUBCUTANEOUS | Status: DC
  Administered 2013-11-20: 25 [IU] via SUBCUTANEOUS
  Filled 2013-11-20 (×3): qty 0.25

## 2013-11-20 MED ORDER — POTASSIUM CHLORIDE 10 MEQ/100ML IV SOLN
INTRAVENOUS | Status: AC
  Filled 2013-11-20: qty 200

## 2013-11-20 MED ORDER — INSULIN DETEMIR 100 UNIT/ML ~~LOC~~ SOLN
8.0000 [IU] | Freq: Once | SUBCUTANEOUS | Status: AC
Start: 2013-11-20 — End: 2013-11-20
  Administered 2013-11-20: 8 [IU] via SUBCUTANEOUS
  Filled 2013-11-20: qty 0.08

## 2013-11-20 NOTE — Progress Notes (Signed)
Tyler Mann ZOX:[id number] DOB: Sep 27, 1981 DOA: 11/19/2013 PCP: Catalina Pizza, MD  Brief narrative: 33 y.o. male, known type I diabetic since age 20 with complications of severe peripheral neuropathy came to [facility] ed 11/19/2013 with 3 to four-day history of feeling unwell with nausea, abdominal pain, vomiting. Cannot quantify exactly how much he vomited. Nonbloody emesis. Found to be in mod-severe DKA On admission, anion gap 34, pH 7.2  Potassium 5.5  Random blood sugar 538  Lactic acid 1.2  Specific gravity 1.010, urinary glucose of 1000, trace ketones, positive acetone  EKG = sinus tachycardia PR interval 0 point 08, QRS axis = 90 almost, peak T waves in lead V2, V3 3, BP 4 with some scalloping ST segment likely rate related.   Past medical history-As per Problem list Chart reviewed as below- reviewed  Consultants:  None  Procedures:  cxr  Antibiotics:  None   Subjective  Feels much better than prior Hungry, asks questions about metabolic abnormalities Has not yet received his long-acting insulin sleepy but in no distress, no abdominal pain currently No cough no cold   Objective    Interim History: None  Telemetry: Sinus   Objective: Filed Vitals:   11/20/13 0500 11/20/13 0600 11/20/13 0700 11/20/13 0800  BP: 126/71 116/82 124/69 122/65  Pulse: 94 92 89 88  Temp:      TempSrc:      Resp: 23 17 20 21   Height:      Weight: 76 kg (167 lb 8.8 oz)     SpO2: 100% 100% 100% 100%    Intake/Output Summary (Last 24 hours) at 11/20/13 0853 Last data filed at 11/20/13 0509  Gross per 24 hour  Intake 2292.75 ml  Output   1075 ml  Net 1217.75 ml    Exam:  General: Body mass index is 22.72 kg/(m^2). Cardiovascular: S1-S2 no murmur rub or gallop Respiratory: Clear Abdomen: Soft nontender  Data Reviewed: Basic Metabolic Panel:  Recent Labs Lab 11/19/13 1842 11/19/13 1955 11/19/13 2220 11/19/13 2355 11/20/13 0340  NA 141 141 142 142 140  K 4.3 4.4 4.1  4.1 4.5  CL 101 101 104 104 103  CO2 12* 16* 19 20 19   GLUCOSE 355* 273* 163* 154* 154*  BUN 14 13 12 11 9   CREATININE 0.87 0.85 0.78 0.72 0.73  CALCIUM 8.5 9.2 8.7 8.5 8.3*   Liver Function Tests:  Recent Labs Lab 11/19/13 1554  AST 19  ALT 25  ALKPHOS 95  BILITOT 0.3  PROT 8.2  ALBUMIN 3.9   No results found for this basename: LIPASE, AMYLASE,  in the last 168 hours No results found for this basename: AMMONIA,  in the last 168 hours CBC:  Recent Labs Lab 11/19/13 1554  WBC 10.0  NEUTROABS 7.4  HGB 14.5  HCT 45.3  MCV 87.5  PLT 409*   Cardiac Enzymes: No results found for this basename: CKTOTAL, CKMB, CKMBINDEX, TROPONINI,  in the last 168 hours BNP: No components found with this basename: POCBNP,  CBG:  Recent Labs Lab 11/20/13 0342 11/20/13 0445 11/20/13 0547 11/20/13 0647 11/20/13 0744  GLUCAP 141* 181* 185* 149* 165*    No results found for this or any previous visit (from the past 240 hour(s)).   Studies:              All Imaging reviewed and is as per above notation   Scheduled Meds: . sodium chloride   Intravenous STAT  . ALPRAZolam  0.25 mg Oral  BID  . heparin  5,000 Units Subcutaneous Q8H  . insulin aspart  0-15 Units Subcutaneous TID WC  . insulin aspart  3 Units Subcutaneous TID WC  . insulin detemir  5 Units Subcutaneous QHS  . insulin detemir  8 Units Subcutaneous Once  . Milnacipran  50 mg Oral BID  . potassium chloride       Continuous Infusions: . sodium chloride Stopped (11/19/13 2050)  . dextrose 5 % and 0.45% NaCl 125 mL/hr at 11/20/13 0509  . insulin (NOVOLIN-R) infusion 1.8 Units/hr (11/20/13 0647)  . insulin (NOVOLIN-R) infusion       Assessment/Plan:  1. DKA (diabetic ketoacidoses)-moderate to severe, given biochemical parameters. AG now=18.  Order given for transition over to Levemir   2. Moderate to severe metabolic acidosis secondary to #1-resolved 3. Diabetic polyneuropathy-I have encouraged him to reach out to  his endocrinologist for further options since gabapentin/Lyrica did not help him  4. History of tatoo-would consider checking hepatitis panels and titers on him as an out patient. He also states he has used drugs in the past  5. Hyperkalemia in the setting of acidosis-resolved with treatment of primary process   Code Status: Full Family Communication: None Disposition Plan: Transfer to telemetry later today   Pleas KochJai Brooklin Rieger, MD  [facility] Pager [phone] 11/20/2013, 8:53 AM    LOS: 1 day

## 2013-11-20 NOTE — Progress Notes (Signed)
Utilization Review Complete  

## 2013-11-20 NOTE — Progress Notes (Signed)
PT IS ALERT AND ORIENTED. SKIN WARM AND DRY. LAST CBG 154. LEVIMIR [place] HAS BEEN GIVEN. ALL IV'S HAVE BEEN D/C'D. NSL PATENT. PT TRANSFERING TO ROOM 321 WHEN ROOM IS CLEANED. REPORT GIVEN

## 2013-11-20 NOTE — Progress Notes (Signed)
Patient has had 4-5 CBG's under 180.  His Anion GAP is 18 with a CO2 under 20; K+ is 4.5 but on-call MD states not to give more runs at this time or start by IV or PO (Had 4 runs total).  Insulin drip restarted at 1.2 per 181 CBG.

## 2013-11-21 LAB — BASIC METABOLIC PANEL
BUN: 7 mg/dL (ref 6–23)
CHLORIDE: 102 meq/L (ref 96–112)
CO2: 27 meq/L (ref 19–32)
Calcium: 8.7 mg/dL (ref 8.4–10.5)
Creatinine, Ser: 0.68 mg/dL (ref 0.50–1.35)
GFR calc Af Amer: >90 mL/min (ref >90)
GFR calc non Af Amer: >90 mL/min (ref >90)
Glucose, Bld: 275 mg/dL — ABNORMAL HIGH (ref 70–99)
Potassium: 3.3 mEq/L — ABNORMAL LOW (ref 3.7–5.3)
SODIUM: 138 meq/L (ref 137–147)

## 2013-11-21 LAB — GLUCOSE, CAPILLARY
GLUCOSE-CAPILLARY: 218 mg/dL — AB (ref 70–99)
Glucose-Capillary: 164 mg/dL — ABNORMAL HIGH (ref 70–99)

## 2013-11-21 MED ORDER — INSULIN ASPART 100 UNIT/ML ~~LOC~~ SOLN: SUBCUTANEOUS | Status: DC

## 2013-11-21 NOTE — Discharge Summary (Signed)
Physician Discharge Summary  Tyler Mann ZOX:[id number] DOB: 09/28/1981 DOA: 11/19/2013  PCP: Catalina Pizza, MD  Admit date: 11/19/2013 Discharge date: 11/21/2013  Time spent: 20 minutes  Recommendations for Outpatient Follow-up:  1. Please followup with her primary care physician 2. Needs A1c in about 3 months 3. Needs close followup with endocrinology   Discharge Diagnoses:  Active Problems:   DKA (diabetic ketoacidoses)   Discharge Condition: Good  Diet recommendation: Diabetic moderate  Filed Weights   11/19/13 1430 11/19/13 1800 11/20/13 0500  Weight: 81.647 kg (180 lb) 75.9 kg (167 lb 5.3 oz) 76 kg (167 lb 8.8 oz)    History of present illness:  33 y.o. male, known type I diabetic since age 17 with complications of severe peripheral neuropathy came to [facility] ed 11/19/2013 with 3 to four-day history of feeling unwell with nausea, abdominal pain, vomiting. Cannot quantify exactly how much he vomited. Nonbloody emesis.  Found to be in mod-severe DKA On admission, anion gap 34, pH 7.2  Potassium 5.5  Random blood sugar 538  Lactic acid 1.2  Specific gravity 1.010, urinary glucose of 1000, trace ketones, positive acetone  EKG = sinus tachycardia PR interval 0 point 08, QRS axis = 90 almost, peak T waves in lead V2, V3 3, BP 4 with some scalloping ST segment likely rate related. Initially admitted to step down unit Rapidly transitioned off of insulin stabilizer-blood sugars very well controlled in the 100-200 range Education provided regarding risks of DKA Patient very capable of giving himself insulin and sophisticated enough 2 up titrate insulin at home. Recommended to him close outpatient followup with primary care physician/endocrinology Patient confirmed to have enough insulin supplies, meter, strips at home  Discharge Exam: Filed Vitals:   11/21/13 0715  BP: 111/68  Pulse: 85  Temp: 97.6 F (36.4 C)  Resp: 20   Doing fair no distress Sleepy at bedside Arouses with no  complaints today, tolerating diet  General: Well Cardiovascular: S1-S2 no murmur rub or gallop Respiratory: Clear  Discharge Instructions  Discharge Orders   Future Orders Complete By Expires   Diet - low sodium heart healthy  As directed    Discharge instructions  As directed    Comments:     Followup with Dr. Margo Aye and with Dr. Fransico Him as an outpatient If you ever had similar symptoms again with nausea vomiting and you are not able to keep down food, is constricted the emergency room Please remember that DKA is a life-threatening diagnosis   Increase activity slowly  As directed        Medication List    STOP taking these medications       insulin lispro 100 UNIT/ML injection  Commonly known as:  HUMALOG      TAKE these medications       ALPRAZolam 0.25 MG tablet  Commonly known as:  XANAX  Take 0.25 mg by mouth 2 (two) times daily.     cyclobenzaprine 10 MG tablet  Commonly known as:  FLEXERIL  Take 20 mg by mouth daily.     insulin aspart 100 UNIT/ML injection  Commonly known as:  novoLOG  Please continue her sliding scale as you used to take at home     insulin detemir 100 UNIT/ML injection  Commonly known as:  LEVEMIR  Inject 25 Units into the skin at bedtime.     Milnacipran 50 MG Tabs tablet  Commonly known as:  SAVELLA  Take 50 mg by mouth 2 (two) times  daily.     Vitamin D (Ergocalciferol) 50000 UNITS Caps capsule  Commonly known as:  DRISDOL  Take 50,000 Units by mouth every 7 (seven) days. Usually takes on Sunday       No Known Allergies    The results of significant diagnostics from this hospitalization (including imaging, microbiology, ancillary and laboratory) are listed below for reference.    Significant Diagnostic Studies: No results found.  Microbiology: No results found for this or any previous visit (from the past 240 hour(s)).   Labs: Basic Metabolic Panel:  Recent Labs Lab 11/19/13 2220 11/19/13 2355 11/20/13 0340  11/20/13 1343 11/21/13 0559  NA 142 142 140 136* 138  K 4.1 4.1 4.5 4.1 3.3*  CL 104 104 103 101 102  CO2 19 20 19 25 27   GLUCOSE 163* 154* 154* 367* 275*  BUN 12 11 9 7 7   CREATININE 0.78 0.72 0.73 0.70 0.68  CALCIUM 8.7 8.5 8.3* 8.7 8.7   Liver Function Tests:  Recent Labs Lab 11/19/13 1554  AST 19  ALT 25  ALKPHOS 95  BILITOT 0.3  PROT 8.2  ALBUMIN 3.9   No results found for this basename: LIPASE, AMYLASE,  in the last 168 hours No results found for this basename: AMMONIA,  in the last 168 hours CBC:  Recent Labs Lab 11/19/13 1554  WBC 10.0  NEUTROABS 7.4  HGB 14.5  HCT 45.3  MCV 87.5  PLT 409*   Cardiac Enzymes: No results found for this basename: CKTOTAL, CKMB, CKMBINDEX, TROPONINI,  in the last 168 hours BNP: BNP (last 3 results) No results found for this basename: PROBNP,  in the last 8760 hours CBG:  Recent Labs Lab 11/20/13 1004 11/20/13 1120 11/20/13 1649 11/20/13 2152 11/21/13 0739  GLUCAP 159* 148* 367* 275* 218*       Signed:  Rhetta MuraSAMTANI, JAI-GURMUKH  [facility] 11/21/2013, 12:11 PM

## 2013-11-21 NOTE — Progress Notes (Signed)
IV removed, site WNL.  Pt given d/c instructions, pt states he has all needed medications at home.  Discussed home care with patient and discussed home medications, patient verbalizes understanding, teachback completed. F/U appointments to be made with Dr Margo AyeHall and Dr Fransico HimNida, pt states he will make and keep appointments. Pt is stable at this time and desires to go home, waiting on ride home.

## 2013-11-21 NOTE — Progress Notes (Signed)
Pt's ride here, refused wheelchair, escorted to main entrance by staff.

## 2014-09-18 ENCOUNTER — Emergency Department (HOSPITAL_COMMUNITY): Payer: BC Managed Care – PPO

## 2014-09-18 ENCOUNTER — Encounter (HOSPITAL_COMMUNITY): Payer: Self-pay | Admitting: Emergency Medicine

## 2014-09-18 ENCOUNTER — Emergency Department (HOSPITAL_COMMUNITY)
Admission: EM | Admit: 2014-09-18 | Discharge: 2014-09-18 | Disposition: A | Discharge status: Home / Self Care | Payer: BC Managed Care – PPO | Attending: Emergency Medicine | Admitting: Emergency Medicine

## 2014-09-18 DIAGNOSIS — Z794 Long term (current) use of insulin: Secondary | ICD-10-CM | POA: Insufficient documentation

## 2014-09-18 DIAGNOSIS — Z79899 Other long term (current) drug therapy: Secondary | ICD-10-CM | POA: Diagnosis not present

## 2014-09-18 DIAGNOSIS — S99911A Unspecified injury of right ankle, initial encounter: Secondary | ICD-10-CM | POA: Diagnosis present

## 2014-09-18 DIAGNOSIS — Z8669 Personal history of other diseases of the nervous system and sense organs: Secondary | ICD-10-CM | POA: Insufficient documentation

## 2014-09-18 DIAGNOSIS — E119 Type 2 diabetes mellitus without complications: Secondary | ICD-10-CM | POA: Insufficient documentation

## 2014-09-18 DIAGNOSIS — T1490XA Injury, unspecified, initial encounter: Secondary | ICD-10-CM

## 2014-09-18 DIAGNOSIS — Z791 Long term (current) use of non-steroidal anti-inflammatories (NSAID): Secondary | ICD-10-CM | POA: Insufficient documentation

## 2014-09-18 DIAGNOSIS — Z720 Tobacco use: Secondary | ICD-10-CM | POA: Insufficient documentation

## 2014-09-18 DIAGNOSIS — R52 Pain, unspecified: Secondary | ICD-10-CM

## 2014-09-18 DIAGNOSIS — S8251XA Displaced fracture of medial malleolus of right tibia, initial encounter for closed fracture: Secondary | ICD-10-CM | POA: Insufficient documentation

## 2014-09-18 MED ORDER — IBUPROFEN 800 MG PO TABS: 800.0000 mg | ORAL_TABLET | Freq: Three times a day (TID) | ORAL | Status: DC

## 2014-09-18 NOTE — Discharge Instructions (Signed)
Ankle Fracture  A fracture is a break in a bone. The ankle joint is made up of three bones. These include the lower (distal)sections of your lower leg bones, called the tibia and fibula, along with a bone in your foot, called the talus. Depending on how bad the break is and if more than one ankle joint bone is broken, a cast or splint is used to protect and keep your injured bone from moving while it heals. Sometimes, surgery is required to help the fracture heal properly.   There are two general types of fractures:   Stable fracture. This includes a single fracture line through one bone, with no injury to ankle ligaments. A fracture of the talus that does not have any displacement (movement of the bone on either side of the fracture line) is also stable.   Unstable fracture. This includes more than one fracture line through one or more bones in the ankle joint. It also includes fractures that have displacement of the bone on either side of the fracture line.  CAUSES   A direct blow to the ankle.    Quickly and severely twisting your ankle.   Trauma, such as a car accident or falling from a significant height.  RISK FACTORS  You may be at a higher risk of ankle fracture if:   You have certain medical conditions.   You are involved in high-impact sports.   You are involved in a high-impact car accident.  SIGNS AND SYMPTOMS    Tender and swollen ankle.   Bruising around the injured ankle.   Pain on movement of the ankle.   Difficulty walking or putting weight on the ankle.   A cold foot below the site of the ankle injury. This can occur if the blood vessels passing through your injured ankle were also damaged.   Numbness in the foot below the site of the ankle injury.  DIAGNOSIS   An ankle fracture is usually diagnosed with a physical exam and X-rays. A CT scan may also be required for complex fractures.  TREATMENT   Stable fractures are treated with a cast or splint and using crutches to avoid putting  weight on your injured ankle. This is followed by an ankle strengthening program. Some patients require a special type of cast, depending on other medical problems they may have. Unstable fractures require surgery to ensure the bones heal properly. Your health care provider will tell you what type of fracture you have and the best treatment for your condition.  HOME CARE INSTRUCTIONS    Review correct crutch use with your health care provider and use your crutches as directed. Safe use of crutches is extremely important. Misuse of crutches can cause you to fall or cause injury to nerves in your hands or armpits.   Do not put weight or pressure on the injured ankle until directed by your health care provider.   To lessen the swelling, keep the injured leg elevated while sitting or lying down.   Apply ice to the injured area:   Put ice in a plastic bag.   Place a towel between your cast and the bag.   Leave the ice on for 20 minutes, 2-3 times a day.   If you have a plaster or fiberglass cast:   Do not try to scratch the skin under the cast with any objects. This can increase your risk of skin infection.   Check the skin around the cast every day. You   may put lotion on any red or sore areas.   Keep your cast dry and clean.   If you have a plaster splint:   Wear the splint as directed.   You may loosen the elastic around the splint if your toes become numb, tingle, or turn cold or blue.   Do not put pressure on any part of your cast or splint; it may break. Rest your cast only on a pillow the first 24 hours until it is fully hardened.   Your cast or splint can be protected during bathing with a plastic bag sealed to your skin with medical tape. Do not lower the cast or splint into water.   Take medicines as directed by your health care provider. Only take over-the-counter or prescription medicines for pain, discomfort, or fever as directed by your health care provider.   Do not drive a vehicle until  your health care provider specifically tells you it is safe to do so.   If your health care provider has given you a follow-up appointment, it is very important to keep that appointment. Not keeping the appointment could result in a chronic or permanent injury, pain, and disability. If you have any problem keeping the appointment, call the facility for assistance.  SEEK MEDICAL CARE IF:  You develop increased swelling or discomfort.  SEEK IMMEDIATE MEDICAL CARE IF:    Your cast gets damaged or breaks.   You have continued severe pain.   You develop new pain or swelling after the cast was put on.   Your skin or toenails below the injury turn blue or gray.   Your skin or toenails below the injury feel cold, numb, or have loss of sensitivity to touch.   There is a bad smell or pus draining from under the cast.  MAKE SURE YOU:    Understand these instructions.   Will watch your condition.   Will get help right away if you are not doing well or get worse.  Document Released: 11/02/2000 Document Revised: 11/10/2013 Document Reviewed: 06/04/2013  [employer] Patient Information 2015 [employer]. This information is not intended to replace advice given to you by your health care provider. Make sure you discuss any questions you have with your health care provider.

## 2014-09-18 NOTE — ED Notes (Signed)
Had hypoglycemic episode 3 days ago and in process, injured R ankle. He is not sure how he hurt it, but it is swollen and tender on medial malleolus.

## 2014-09-18 NOTE — ED Provider Notes (Signed)
Medical screening examination/treatment/procedure(s) were performed by non-physician practitioner and as supervising physician I was immediately available for consultation/collaboration.   EKG Interpretation None        Benny LennertJoseph L Benjerman Molinelli, MD 09/18/14 2252

## 2014-09-18 NOTE — ED Provider Notes (Signed)
CSN: [id number]     Arrival date & time 09/18/14  1846 History   First MD Initiated Contact with Patient 09/18/14 1902     Chief Complaint  Patient presents with  . Ankle Pain     (Consider location/radiation/quality/duration/timing/severity/associated sxs/prior Treatment) HPI  Tyler Mann is a 33 y.o. male who presents to the Emergency Department complaining of persistent pain and swelling of the medial right ankle for one week. Patient states that he is a diabetic and has frequent episodes of hypoglycemia and he reports an injury to his right ankle that occurred during one of his hypoglycemic episodes. He believes that he may have tripped down some steps, but he is unsure of an actual injury. He reports significant swelling and bruising of the ankle at onset and states that it has significantly improved. He reports that he is able to bear weight to his right foot, but has pain with stepping or putting pressure to his front foot.   Past Medical History  Diagnosis Date  . Diabetes mellitus   . Neuropathy   . Narcotic addiction    Past Surgical History  Procedure Laterality Date  . Mouth surgery     History reviewed. No pertinent family history. History  Substance Use Topics  . Smoking status: Current Every Day Smoker -- 1.00 packs/day for 17 years    Types: Cigarettes  . Smokeless tobacco: Current User  . Alcohol Use: No    Review of Systems  Constitutional: Negative for fever and chills.  Genitourinary: Negative for dysuria and difficulty urinating.  Musculoskeletal: Positive for arthralgias and joint swelling.       Right ankle pain and swelling  Skin: Negative for color change and wound.  Neurological: Negative for weakness and numbness.  All other systems reviewed and are negative.     Allergies  Review of patient's allergies indicates no known allergies.  Home Medications   Prior to Admission medications   Medication Sig Start Date End Date Taking?  Authorizing Provider  ALPRAZolam (XANAX) 0.25 MG tablet Take 0.25 mg by mouth 2 (two) times daily.    Historical Provider, MD  cyclobenzaprine (FLEXERIL) 10 MG tablet Take 20 mg by mouth daily.    Historical Provider, MD  ibuprofen (ADVIL,MOTRIN) 800 MG tablet Take 1 tablet (800 mg total) by mouth 3 (three) times daily. With food 09/18/14   Charolett Yarrow L. Modean Mccullum, PA-C  insulin aspart (NOVOLOG) 100 UNIT/ML injection Please continue her sliding scale as you used to take at home 11/21/13   Rhetta MuraJai-Gurmukh Samtani, MD  insulin detemir (LEVEMIR) 100 UNIT/ML injection Inject 25 Units into the skin at bedtime.     Historical Provider, MD  Milnacipran (SAVELLA) 50 MG TABS tablet Take 50 mg by mouth 2 (two) times daily.    Historical Provider, MD  Vitamin D, Ergocalciferol, (DRISDOL) 50000 UNITS CAPS capsule Take 50,000 Units by mouth every 7 (seven) days. Usually takes on Sunday    Historical Provider, MD   BP 161/86  Temp(Src) 98.9 F (37.2 C) (Oral)  Resp 20  Ht 6' (1.829 m)  Wt 190 lb (86.183 kg)  BMI 25.76 kg/m2  SpO2 100% Physical Exam  Nursing note and vitals reviewed. Constitutional: He is oriented to person, place, and time. He appears well-developed and well-nourished. No distress.  HENT:  Head: Normocephalic and atraumatic.  Cardiovascular: Normal rate, regular rhythm, normal heart sounds and intact distal pulses.   Pulmonary/Chest: Effort normal and breath sounds normal.  Musculoskeletal: He exhibits tenderness.  Tenderness  to palpation of the medial right ankle. Moderate soft tissue swelling is present  DP pulse is brisk,distal sensation intact.  No erythema, abrasion, bruising or bony deformity.  No proximal tenderness. Compartments are soft  Neurological: He is alert and oriented to person, place, and time. He exhibits normal muscle tone. Coordination normal.  Skin: Skin is warm and dry.    ED Course  Procedures (including critical care time) Labs Review Labs Reviewed - No data to  display  Imaging Review Dg Ankle Complete Right  09/18/2014   CLINICAL DATA:  Fall 1 week ago. Medial ankle pain. Initial encounter.  EXAM: RIGHT ANKLE - COMPLETE 3+ VIEW  COMPARISON:  None.  FINDINGS: Nondisplaced fracture is seen involving the medial malleolus at the level of tibial plafond. No other fractures are identified. No evidence of dislocation. No evidence of ankle joint effusion. Mild peripheral vascular calcification noted.  IMPRESSION: Nondisplaced medial malleolar fracture.   Electronically Signed   By: Myles RosenthalJohn  Stahl M.D.   On: 09/18/2014 19:45     EKG Interpretation None      MDM   Final diagnoses:  Closed fracture of medial malleolus of right ankle, initial encounter    Patient was 281-week-old injury to the right ankle. X-ray shows nondisplaced malleolar fracture. has been wearing weight to the ankle.  Stirrup splint applied, pain improved, remains neurovascularly intact patient has crutches at home. He agrees to be nonweightbearing to the ankle, elevate and apply ice prescription for ibuprofen for pain referral information for Dr. Romeo AppleHarrison was given. Patient agrees to plan.    Deztiny Sarra L. Trisha Mangleriplett, PA-C 09/18/14 2205

## 2014-11-02 ENCOUNTER — Emergency Department (HOSPITAL_COMMUNITY): Payer: BC Managed Care – PPO

## 2014-11-02 ENCOUNTER — Encounter (HOSPITAL_COMMUNITY): Payer: Self-pay | Admitting: Emergency Medicine

## 2014-11-02 ENCOUNTER — Inpatient Hospital Stay (HOSPITAL_COMMUNITY)
Admission: EM | Admit: 2014-11-02 | Discharge: 2014-11-04 | DRG: 638 | Disposition: A | Discharge status: Home / Self Care | Payer: BC Managed Care – PPO | Attending: Internal Medicine | Admitting: Internal Medicine

## 2014-11-02 DIAGNOSIS — Z79899 Other long term (current) drug therapy: Secondary | ICD-10-CM | POA: Diagnosis not present

## 2014-11-02 DIAGNOSIS — R110 Nausea: Secondary | ICD-10-CM

## 2014-11-02 DIAGNOSIS — M549 Dorsalgia, unspecified: Secondary | ICD-10-CM

## 2014-11-02 DIAGNOSIS — K920 Hematemesis: Secondary | ICD-10-CM | POA: Diagnosis present

## 2014-11-02 DIAGNOSIS — R112 Nausea with vomiting, unspecified: Secondary | ICD-10-CM | POA: Diagnosis not present

## 2014-11-02 DIAGNOSIS — Z791 Long term (current) use of non-steroidal anti-inflammatories (NSAID): Secondary | ICD-10-CM | POA: Diagnosis not present

## 2014-11-02 DIAGNOSIS — D72829 Elevated white blood cell count, unspecified: Secondary | ICD-10-CM | POA: Diagnosis present

## 2014-11-02 DIAGNOSIS — F17210 Nicotine dependence, cigarettes, uncomplicated: Secondary | ICD-10-CM | POA: Diagnosis present

## 2014-11-02 DIAGNOSIS — G8929 Other chronic pain: Secondary | ICD-10-CM | POA: Diagnosis present

## 2014-11-02 DIAGNOSIS — Z794 Long term (current) use of insulin: Secondary | ICD-10-CM

## 2014-11-02 DIAGNOSIS — E1010 Type 1 diabetes mellitus with ketoacidosis without coma: Secondary | ICD-10-CM | POA: Diagnosis not present

## 2014-11-02 DIAGNOSIS — F411 Generalized anxiety disorder: Secondary | ICD-10-CM | POA: Diagnosis present

## 2014-11-02 DIAGNOSIS — E875 Hyperkalemia: Secondary | ICD-10-CM | POA: Diagnosis present

## 2014-11-02 DIAGNOSIS — E876 Hypokalemia: Secondary | ICD-10-CM

## 2014-11-02 DIAGNOSIS — Z720 Tobacco use: Secondary | ICD-10-CM | POA: Diagnosis present

## 2014-11-02 HISTORY — DX: Other chronic pain: G89.29

## 2014-11-02 HISTORY — DX: Dorsalgia, unspecified: M54.9

## 2014-11-02 LAB — GLUCOSE, CAPILLARY
GLUCOSE-CAPILLARY: 402 mg/dL — AB (ref 70–99)
GLUCOSE-CAPILLARY: 297 mg/dL — AB (ref 70–99)
GLUCOSE-CAPILLARY: 173 mg/dL — AB (ref 70–99)
GLUCOSE-CAPILLARY: 133 mg/dL — AB (ref 70–99)
Glucose-Capillary: 373 mg/dL — ABNORMAL HIGH (ref 70–99)
Glucose-Capillary: 219 mg/dL — ABNORMAL HIGH (ref 70–99)
Glucose-Capillary: 191 mg/dL — ABNORMAL HIGH (ref 70–99)
Glucose-Capillary: 170 mg/dL — ABNORMAL HIGH (ref 70–99)
Glucose-Capillary: 149 mg/dL — ABNORMAL HIGH (ref 70–99)
Glucose-Capillary: 160 mg/dL — ABNORMAL HIGH (ref 70–99)
Glucose-Capillary: 151 mg/dL — ABNORMAL HIGH (ref 70–99)
Glucose-Capillary: 140 mg/dL — ABNORMAL HIGH (ref 70–99)
Glucose-Capillary: 135 mg/dL — ABNORMAL HIGH (ref 70–99)

## 2014-11-02 LAB — BLOOD GAS, ARTERIAL
Acid-base deficit: 26.7 mmol/L — ABNORMAL HIGH (ref 0.0–2.0)
BICARBONATE: 2.5 meq/L — AB (ref 20.0–24.0)
Drawn by: 234301
O2 Content: 2 L/min
O2 SAT: 96.4 %
PCO2 ART: 9.3 mmHg — AB (ref 35.0–45.0)
PO2 ART: 149 mmHg — AB (ref 80.0–100.0)
Patient temperature: 37
TCO2: 2.4 mmol/L (ref 0–100)
pH, Arterial: 7.05 — CL (ref 7.350–7.450)

## 2014-11-02 LAB — CBC WITH DIFFERENTIAL/PLATELET
Basophils Absolute: 0.1 10*3/uL (ref 0.0–0.1)
Basophils Relative: 0 % (ref 0–1)
Eosinophils Absolute: 0 10*3/uL (ref 0.0–0.7)
Eosinophils Relative: 0 % (ref 0–5)
HCT: 43.8 % (ref 39.0–52.0)
Hemoglobin: 13.9 g/dL (ref 13.0–17.0)
Lymphocytes Relative: 11 % — ABNORMAL LOW (ref 12–46)
Lymphs Abs: 2.3 10*3/uL (ref 0.7–4.0)
MCH: 27.8 pg (ref 26.0–34.0)
MCHC: 31.7 g/dL (ref 30.0–36.0)
MCV: 87.6 fL (ref 78.0–100.0)
Monocytes Absolute: 0.4 10*3/uL (ref 0.1–1.0)
Monocytes Relative: 2 % — ABNORMAL LOW (ref 3–12)
Neutro Abs: 18.4 10*3/uL — ABNORMAL HIGH (ref 1.7–7.7)
Neutrophils Relative %: 87 % — ABNORMAL HIGH (ref 43–77)
Platelets: 453 10*3/uL — ABNORMAL HIGH (ref 150–400)
RBC: 5 MIL/uL (ref 4.22–5.81)
RDW: 18.5 % — ABNORMAL HIGH (ref 11.5–15.5)
WBC: 21.2 10*3/uL — ABNORMAL HIGH (ref 4.0–10.5)

## 2014-11-02 LAB — BASIC METABOLIC PANEL
ANION GAP: 17 — AB (ref 5–15)
ANION GAP: 15 (ref 5–15)
Anion gap: 23 — ABNORMAL HIGH (ref 5–15)
BUN: 19 mg/dL (ref 6–23)
BUN: 16 mg/dL (ref 6–23)
BUN: 15 mg/dL (ref 6–23)
BUN: 13 mg/dL (ref 6–23)
CALCIUM: 7.8 mg/dL — AB (ref 8.4–10.5)
CALCIUM: 7.9 mg/dL — AB (ref 8.4–10.5)
CALCIUM: 7.9 mg/dL — AB (ref 8.4–10.5)
CHLORIDE: 109 meq/L (ref 96–112)
CO2: 12 mEq/L — ABNORMAL LOW (ref 19–32)
CO2: 19 meq/L (ref 19–32)
CO2: 21 meq/L (ref 19–32)
CREATININE: 1 mg/dL (ref 0.50–1.35)
CREATININE: 0.93 mg/dL (ref 0.50–1.35)
CREATININE: 0.93 mg/dL (ref 0.50–1.35)
Calcium: 7.9 mg/dL — ABNORMAL LOW (ref 8.4–10.5)
Chloride: 106 mEq/L (ref 96–112)
Chloride: 109 mEq/L (ref 96–112)
Chloride: 109 mEq/L (ref 96–112)
Creatinine, Ser: 0.9 mg/dL (ref 0.50–1.35)
GFR calc Af Amer: >90 mL/min (ref >90)
GFR calc Af Amer: >90 mL/min (ref >90)
GFR calc Af Amer: >90 mL/min (ref >90)
GFR calc Af Amer: >90 mL/min (ref >90)
GFR calc non Af Amer: >90 mL/min (ref >90)
GFR calc non Af Amer: >90 mL/min (ref >90)
GFR calc non Af Amer: >90 mL/min (ref >90)
GLUCOSE: 203 mg/dL — AB (ref 70–99)
GLUCOSE: 169 mg/dL — AB (ref 70–99)
GLUCOSE: 152 mg/dL — AB (ref 70–99)
Glucose, Bld: 436 mg/dL — ABNORMAL HIGH (ref 70–99)
Potassium: 5.5 mEq/L — ABNORMAL HIGH (ref 3.7–5.3)
Potassium: 4.8 mEq/L (ref 3.7–5.3)
Potassium: 4.2 mEq/L (ref 3.7–5.3)
Potassium: 3.7 mEq/L (ref 3.7–5.3)
Sodium: 145 mEq/L (ref 137–147)
Sodium: 144 mEq/L (ref 137–147)
Sodium: 145 mEq/L (ref 137–147)
Sodium: 145 mEq/L (ref 137–147)

## 2014-11-02 LAB — HEMOGLOBIN A1C
Hgb A1c MFr Bld: 10.5 % — ABNORMAL HIGH (ref <5.7)
Mean Plasma Glucose: 255 mg/dL — ABNORMAL HIGH (ref <117)

## 2014-11-02 LAB — URINALYSIS, ROUTINE W REFLEX MICROSCOPIC
Bilirubin Urine: NEGATIVE
Bilirubin Urine: NEGATIVE
Glucose, UA: >1000 mg/dL — AB
Glucose, UA: >1000 mg/dL — AB
Ketones, ur: >80 mg/dL — AB
Leukocytes, UA: NEGATIVE
Leukocytes, UA: NEGATIVE
Nitrite: NEGATIVE
Nitrite: NEGATIVE
PROTEIN: NEGATIVE mg/dL
Specific Gravity, Urine: 1.025 (ref 1.005–1.030)
Specific Gravity, Urine: 1.02 (ref 1.005–1.030)
Urobilinogen, UA: 0.2 mg/dL (ref 0.0–1.0)
Urobilinogen, UA: 0.2 mg/dL (ref 0.0–1.0)
pH: 5.5 (ref 5.0–8.0)
pH: 5 (ref 5.0–8.0)

## 2014-11-02 LAB — MRSA PCR SCREENING: MRSA BY PCR: NEGATIVE

## 2014-11-02 LAB — COMPREHENSIVE METABOLIC PANEL
ALT: 33 U/L (ref 0–53)
AST: 19 U/L (ref 0–37)
Albumin: 4 g/dL (ref 3.5–5.2)
Alkaline Phosphatase: 155 U/L — ABNORMAL HIGH (ref 39–117)
BUN: 20 mg/dL (ref 6–23)
Calcium: 9.6 mg/dL (ref 8.4–10.5)
Chloride: 90 mEq/L — ABNORMAL LOW (ref 96–112)
Creatinine, Ser: 1.18 mg/dL (ref 0.50–1.35)
GFR calc Af Amer: >90 mL/min (ref >90)
GFR, EST NON AFRICAN AMERICAN: 79 mL/min — AB (ref >90)
GLUCOSE: 673 mg/dL — AB (ref 70–99)
Potassium: 6.2 mEq/L — ABNORMAL HIGH (ref 3.7–5.3)
SODIUM: 139 meq/L (ref 137–147)
Total Bilirubin: 0.2 mg/dL — ABNORMAL LOW (ref 0.3–1.2)
Total Protein: 8.6 g/dL — ABNORMAL HIGH (ref 6.0–8.3)

## 2014-11-02 LAB — CBG MONITORING, ED
Glucose-Capillary: 573 mg/dL (ref 70–99)
Glucose-Capillary: 586 mg/dL (ref 70–99)

## 2014-11-02 LAB — URINE MICROSCOPIC-ADD ON

## 2014-11-02 LAB — TSH: TSH: 0.22 u[IU]/mL — AB (ref 0.350–4.500)

## 2014-11-02 LAB — LIPASE, BLOOD: Lipase: 19 U/L (ref 11–59)

## 2014-11-02 MED ORDER — PROMETHAZINE HCL 12.5 MG PO TABS: 12.5000 mg | ORAL_TABLET | Freq: Four times a day (QID) | ORAL | Status: DC | PRN

## 2014-11-02 MED ORDER — LEVALBUTEROL HCL 0.63 MG/3ML IN NEBU
0.6300 mg | INHALATION_SOLUTION | Freq: Four times a day (QID) | RESPIRATORY_TRACT | Status: DC | PRN
Start: 2014-11-02 — End: 2014-11-04

## 2014-11-02 MED ORDER — SODIUM CHLORIDE 0.9 % IV SOLN: INTRAVENOUS | Status: DC

## 2014-11-02 MED ORDER — SODIUM CHLORIDE 0.9 % IV SOLN
1000.0000 mL | Freq: Once | INTRAVENOUS | Status: AC
  Administered 2014-11-02: 1000 mL via INTRAVENOUS

## 2014-11-02 MED ORDER — NICOTINE 21 MG/24HR TD PT24
21.0000 mg | MEDICATED_PATCH | Freq: Every day | TRANSDERMAL | Status: DC
  Administered 2014-11-04: 21 mg via TRANSDERMAL
  Filled 2014-11-02 (×2): qty 1

## 2014-11-02 MED ORDER — CHLORHEXIDINE GLUCONATE 0.12 % MT SOLN
15.0000 mL | Freq: Two times a day (BID) | OROMUCOSAL | Status: DC
  Administered 2014-11-02 – 2014-11-04 (×3): 15 mL via OROMUCOSAL
  Filled 2014-11-02 (×4): qty 15

## 2014-11-02 MED ORDER — PANTOPRAZOLE SODIUM 40 MG IV SOLR
40.0000 mg | Freq: Two times a day (BID) | INTRAVENOUS | Status: DC
  Administered 2014-11-02 – 2014-11-03 (×3): 40 mg via INTRAVENOUS
  Filled 2014-11-02 (×3): qty 40

## 2014-11-02 MED ORDER — LORAZEPAM 2 MG/ML IJ SOLN: 0.2500 mg | Freq: Four times a day (QID) | INTRAMUSCULAR | Status: DC | PRN

## 2014-11-02 MED ORDER — MORPHINE SULFATE 4 MG/ML IJ SOLN
4.0000 mg | Freq: Once | INTRAMUSCULAR | Status: AC
  Administered 2014-11-02: 4 mg via INTRAVENOUS
  Filled 2014-11-02: qty 1

## 2014-11-02 MED ORDER — ACETAMINOPHEN 325 MG PO TABS: 650.0000 mg | ORAL_TABLET | Freq: Four times a day (QID) | ORAL | Status: DC | PRN

## 2014-11-02 MED ORDER — DEXTROSE-NACL 5-0.45 % IV SOLN: INTRAVENOUS | Status: DC

## 2014-11-02 MED ORDER — SODIUM CHLORIDE 0.9 % IV SOLN
INTRAVENOUS | Status: DC
  Administered 2014-11-02: 11:00:00 via INTRAVENOUS

## 2014-11-02 MED ORDER — SODIUM CHLORIDE 0.9 % IV SOLN: 1000.0000 mL | Freq: Once | INTRAVENOUS | Status: DC

## 2014-11-02 MED ORDER — ONDANSETRON HCL 4 MG/2ML IJ SOLN
4.0000 mg | Freq: Once | INTRAMUSCULAR | Status: AC
  Administered 2014-11-02: 4 mg via INTRAVENOUS
  Filled 2014-11-02: qty 2

## 2014-11-02 MED ORDER — ONDANSETRON HCL 4 MG/2ML IJ SOLN: 4.0000 mg | Freq: Three times a day (TID) | INTRAMUSCULAR | Status: DC | PRN

## 2014-11-02 MED ORDER — SODIUM CHLORIDE 0.9 % IV SOLN
1000.0000 mL | INTRAVENOUS | Status: DC
  Administered 2014-11-02: 1000 mL via INTRAVENOUS

## 2014-11-02 MED ORDER — DEXTROSE-NACL 5-0.45 % IV SOLN
INTRAVENOUS | Status: DC
  Administered 2014-11-02 (×3): via INTRAVENOUS

## 2014-11-02 MED ORDER — CETYLPYRIDINIUM CHLORIDE 0.05 % MT LIQD
7.0000 mL | Freq: Two times a day (BID) | OROMUCOSAL | Status: DC
  Administered 2014-11-02 – 2014-11-04 (×3): 7 mL via OROMUCOSAL

## 2014-11-02 MED ORDER — SODIUM CHLORIDE 0.9 % IV SOLN: 1000.0000 mL | INTRAVENOUS | Status: DC

## 2014-11-02 MED ORDER — SODIUM CHLORIDE 0.9 % IV SOLN
INTRAVENOUS | Status: DC
  Administered 2014-11-02: 5.3 [IU]/h via INTRAVENOUS
  Filled 2014-11-02 (×2): qty 2.5

## 2014-11-02 MED ORDER — MORPHINE SULFATE 4 MG/ML IJ SOLN
4.0000 mg | INTRAMUSCULAR | Status: DC | PRN
  Administered 2014-11-03 – 2014-11-04 (×5): 4 mg via INTRAVENOUS
  Filled 2014-11-02 (×5): qty 1

## 2014-11-02 MED ORDER — ACETAMINOPHEN 650 MG RE SUPP: 650.0000 mg | Freq: Four times a day (QID) | RECTAL | Status: DC | PRN

## 2014-11-02 MED ORDER — SODIUM CHLORIDE 0.9 % IV SOLN
INTRAVENOUS | Status: DC
  Administered 2014-11-02: 5.3 [IU]/h via INTRAVENOUS
  Filled 2014-11-02: qty 2.5

## 2014-11-02 MED ORDER — SODIUM CHLORIDE 0.9 % IV SOLN: INTRAVENOUS | Status: AC

## 2014-11-02 MED ORDER — ONDANSETRON HCL 4 MG/2ML IJ SOLN
4.0000 mg | Freq: Four times a day (QID) | INTRAMUSCULAR | Status: DC
  Administered 2014-11-02 – 2014-11-04 (×5): 4 mg via INTRAVENOUS
  Filled 2014-11-02 (×5): qty 2

## 2014-11-02 MED ORDER — LIVING WELL WITH DIABETES BOOK
Freq: Once | Status: AC
  Administered 2014-11-02: 15:00:00
  Filled 2014-11-02: qty 1

## 2014-11-02 NOTE — ED Notes (Signed)
[facility] to [facility] back.

## 2014-11-02 NOTE — ED Notes (Signed)
Pt requesting something for pain.  Notified edp 

## 2014-11-02 NOTE — ED Provider Notes (Signed)
CSN: [id number]     Arrival date & time 11/02/14  4098 History  This chart was scribed for Tyler Roller, MD by Tonye Royalty, ED Scribe. This patient was seen in room APA06/[facility]06 and the patient's care was started at 7:20 AM.    Chief Complaint  Patient presents with  . Hyperglycemia   The history is provided by the patient. No language interpreter was used.    Symptoms were gradual in onset Symptoms are persistent Symptoms are improved with nothing Made worse with nothing Associated symptoms include diffuse pain, vomiting, cough   HPI Comments: Tyler Mann is a 33 y.o. male with history of DM who presents to the Emergency Department complaining of hyperglycemia with onset 5 days ago. He states he believes he is in DKA. He reports diffuse pain with associated vomiting and cough. He states he is compliant with his medications.  He states he believes he is in DKA due to stress.  The patient does have a history of recurrent diabetic ketoacidosis, he denies any history of dietary indiscretions and states that he has been compliant with his medications. His mother reports that he is having ongoing sickness for the last 2 or 3 days.  Past Medical History  Diagnosis Date  . Diabetes mellitus   . Neuropathy   . Narcotic addiction    Past Surgical History  Procedure Laterality Date  . Mouth surgery     No family history on file. History  Substance Use Topics  . Smoking status: Current Every Day Smoker -- 1.00 packs/day for 17 years    Types: Cigarettes  . Smokeless tobacco: Current User  . Alcohol Use: No    Review of Systems  Respiratory: Positive for cough.   Gastrointestinal: Positive for nausea and vomiting.  Musculoskeletal: Positive for myalgias.  Psychiatric/Behavioral:       Stress  All other systems reviewed and are negative.     Allergies  Review of patient's allergies indicates no known allergies.  Home Medications   Prior to Admission medications    Medication Sig Start Date End Date Taking? Authorizing Provider  ALPRAZolam (XANAX) 0.25 MG tablet Take 0.25 mg by mouth 2 (two) times daily.   Yes Historical Provider, MD  cyclobenzaprine (FLEXERIL) 10 MG tablet Take 20 mg by mouth daily.   Yes Historical Provider, MD  escitalopram (LEXAPRO) 10 MG tablet Take 10 mg by mouth daily.   Yes Historical Provider, MD  ibuprofen (ADVIL,MOTRIN) 800 MG tablet Take 1 tablet (800 mg total) by mouth 3 (three) times daily. With food 09/18/14  Yes Tammy L. Triplett, PA-C  insulin aspart (NOVOLOG) 100 UNIT/ML injection Please continue her sliding scale as you used to take at home Patient taking differently: Inject 5-35 Units into the skin 3 (three) times daily with meals. Please continue her sliding scale as you used to take at home 11/21/13  Yes Rhetta Mura, MD  insulin detemir (LEVEMIR) 100 UNIT/ML injection Inject 25-35 Units into the skin at bedtime.    Yes Historical Provider, MD  meloxicam (MOBIC) 15 MG tablet Take 15 mg by mouth daily.   Yes Historical Provider, MD  Milnacipran (SAVELLA) 50 MG TABS tablet Take 50 mg by mouth 2 (two) times daily.   Yes Historical Provider, MD  Vitamin D, Ergocalciferol, (DRISDOL) 50000 UNITS CAPS capsule Take 50,000 Units by mouth every 7 (seven) days. Usually takes on Sunday   Yes Historical Provider, MD   There were no vitals taken for this visit. Physical  Exam  Constitutional: He appears well-developed and well-nourished. No distress.  HENT:  Head: Normocephalic and atraumatic.  Mouth/Throat: No oropharyngeal exudate.  Dry mucous membranes  Eyes: Conjunctivae and EOM are normal. Pupils are equal, round, and reactive to light. Right eye exhibits no discharge. Left eye exhibits no discharge. No scleral icterus.  Neck: Normal range of motion. Neck supple. No JVD present. No thyromegaly present.  Cardiovascular: Regular rhythm, normal heart sounds and intact distal pulses.  Exam reveals no gallop and no friction  rub.   No murmur heard. Tachycardia Strong pulses  Pulmonary/Chest: Effort normal and breath sounds normal. No respiratory distress. He has no wheezes. He has no rales.  tachypnea  Abdominal: Soft. Bowel sounds are normal. He exhibits no distension and no mass. There is no tenderness.  Musculoskeletal: Normal range of motion. He exhibits no edema or tenderness.  Lymphadenopathy:    He has no cervical adenopathy.  Neurological: He is alert. Coordination normal.  Skin: Skin is warm and dry. No rash noted. No erythema.  Psychiatric: He has a normal mood and affect. His behavior is normal.  Nursing note and vitals reviewed.   ED Course  Procedures (including critical care time)   COORDINATION OF CARE: 7:22 AM Discussed treatment plan with patient at beside, the patient agrees with the plan and has no further questions at this time.   Labs Review Labs Reviewed  CBC WITH DIFFERENTIAL - Abnormal; Notable for the following:    WBC 21.2 (*)    RDW 18.5 (*)    Platelets 453 (*)    Neutrophils Relative % 87 (*)    Neutro Abs 18.4 (*)    Lymphocytes Relative 11 (*)    Monocytes Relative 2 (*)    All other components within normal limits  COMPREHENSIVE METABOLIC PANEL - Abnormal; Notable for the following:    Potassium 6.2 (*)    Chloride 90 (*)    CO2 <7 (*)    Glucose, Bld 673 (*)    Total Protein 8.6 (*)    Alkaline Phosphatase 155 (*)    Total Bilirubin 0.2 (*)    GFR calc non Af Amer 79 (*)    All other components within normal limits  URINALYSIS, ROUTINE W REFLEX MICROSCOPIC - Abnormal; Notable for the following:    Glucose, UA >1000 (*)    Hgb urine dipstick TRACE (*)    Ketones, ur >80 (*)    Protein, ur TRACE (*)    All other components within normal limits  BLOOD GAS, ARTERIAL - Abnormal; Notable for the following:    pH, Arterial 7.050 (*)    pCO2 arterial 9.3 (*)    pO2, Arterial 149.0 (*)    Bicarbonate 2.5 (*)    Acid-base deficit 26.7 (*)    All other  components within normal limits  CBG MONITORING, ED - Abnormal; Notable for the following:    Glucose-Capillary 573 (*)    All other components within normal limits  CBG MONITORING, ED - Abnormal; Notable for the following:    Glucose-Capillary 586 (*)    All other components within normal limits  LIPASE, BLOOD  URINE MICROSCOPIC-ADD ON  URINALYSIS, ROUTINE W REFLEX MICROSCOPIC    Imaging Review Dg Chest Portable 1 View  11/02/2014   CLINICAL DATA:  Hyperglycemia with onset 5 days ago, diffuse pain and vomiting with cough  EXAM: PORTABLE CHEST - 1 VIEW  COMPARISON:  None.  FINDINGS: The heart size and mediastinal contours are within normal limits.  Both lungs are clear. The visualized skeletal structures are unremarkable.  IMPRESSION: No active disease.   Electronically Signed   By: Elige KoHetal  Patel   On: 11/02/2014 07:49     EKG Interpretation   Date/Time:  Tuesday November 02 2014 07:38:31 EST Ventricular Rate:  117 PR Interval:  116 QRS Duration: 96 QT Interval:  332 QTC Calculation: 463 R Axis:   83 Text Interpretation:  Sinus tachycardia Biatrial enlargement RSR' in V1 or  V2, probably normal variant Left ventricular hypertrophy Baseline wander  in lead(s) V1 V5 Since last tracing rate faster Confirmed by Xitlaly Ault  MD,  Trooper Olander ([id number]) on 11/02/2014 8:00:27 AM      MDM   Final diagnoses:  Diabetic ketoacidosis without coma associated with type 1 diabetes mellitus    The patient appears critically ill, he is to keep taken tachycardic, he is likely very acidotic, he will need IV fluid resuscitation as well as insulin drip for his severe diabetic ketoacidosis.  ABG confirms that the patient in fact has a severe acidosis, pH of 7.05, very low bicarbonate, oxygenating well. Chest x-ray negative, other labs show significant leukocytosis. Urinalysis shows ketonuria but no signs of infection. He is hyperkalemic, this is likely related to the acidosis, suspect this will improve with  insulin administration  2 large-bore IVs requested, IV fluids running wide open, multiple boluses, insulin drip, discussed with hospitalist for admission to the intensive care unit.  CRITICAL CARE Performed by: Tyler RollerMILLER,Jasyn Mey D Total critical care time: 35 Critical care time was exclusive of separately billable procedures and treating other patients. Critical care was necessary to treat or prevent imminent or life-threatening deterioration. Critical care was time spent personally by me on the following activities: development of treatment plan with patient and/or surrogate as well as nursing, discussions with consultants, evaluation of patient's response to treatment, examination of patient, obtaining history from patient or surrogate, ordering and performing treatments and interventions, ordering and review of laboratory studies, ordering and review of radiographic studies, pulse oximetry and re-evaluation of patient's condition.   I personally performed the services described in this documentation, which was scribed in my presence. The recorded information has been reviewed and is accurate.     Tyler RollerBrian D Laynie Espy, MD 11/02/14 [phone]

## 2014-11-02 NOTE — ED Notes (Signed)
Per EMS, patient has been having trouble with blood sugars x 5 days; states usually resolves on own, but patient began vomiting 2 days ago.

## 2014-11-02 NOTE — ED Notes (Signed)
CRITICAL VALUE ALERT  Critical value received:  Glucose 673, CO2 4  Date of notification:  11/02/14  Time of notification:  0724  Critical value read back: yes  Nurse who received alert:  Juanita LasterAmanda Chakia Counts, RN  MD notified (1st page):  Hyacinth MeekerMiller  Time of first page:  0724  MD notified (2nd page):  Time of second page:  Responding MD:  Hyacinth MeekerMiller  Time MD responded:  [phone]

## 2014-11-02 NOTE — Care Management Utilization Note (Signed)
UR complete 

## 2014-11-02 NOTE — H&P (Signed)
[facility] History and Physical  DAMIONE ROBIDEAU ZOX:[id number] DOB: Aug 04, 1981 DOA: 11/02/2014  Referring physician: ED PHYSICIAN, DR. Hyacinth Meeker PCP: Catalina Pizza, MD   Chief Complaint: Elevated blood sugar, nausea, and vomiting.  HPI: Tyler Mann is a 33 y.o. male with a history of type 1 diabetes, diagnosed at 33 years of age. He also has a history of DKA, chronic back pain, and chronic anxiety. He presents to the emergency department with a five-day history, of feeling weak, shortness of breath, nausea, and vomiting. Over the last 3 days, he has vomited multiple times. Some of his emesis had a small amount of blood in it. He denies abdominal pain, chest congestion, fever, chills, pain with urination, or unusual swelling in his extremities. He does have chronic low back pain which worsens with DKA. He denies missing any of his insulin: He reports taking 20-40 units of Levemir twice daily and sliding scale NovoLog.Marland Kitchen He reports increased stress at home.  In the ED, he was tachycardic and tachypnea. His blood pressure was within normal limits. His chest x-ray is nonacute. His urinalysis reveals greater than 80 ketones, but no WBCs or bacteria. His lab data are significant for a glucose of 673, CO2 of less than 7, WBC of 21.2, and ABG with a pH of 7.05. He is being admitted for further evaluation and management.   Review of Systems:  As above in history present illness. In addition, he has chronic anxiety, chronic shoulder pain, chronic low back pain.  Past Medical History  Diagnosis Date  . Diabetes mellitus   . Neuropathy   . Narcotic addiction     Hx IV heroin abuse  . Chronic back pain    Past Surgical History  Procedure Laterality Date  . Mouth surgery     Social History: He is single. He has no children. He smokes one pack of cigarettes per day. He denies alcohol use. He has a history of IV heroin which he says he has been abstinent for 2 years.   No Known  Allergies  Family history-His parents are living. No specific chronic medical conditions.  Prior to Admission medications   Medication Sig Start Date End Date Taking? Authorizing Provider  ALPRAZolam (XANAX) 0.25 MG tablet Take 0.25 mg by mouth 2 (two) times daily.   Yes Historical Provider, MD  cyclobenzaprine (FLEXERIL) 10 MG tablet Take 20 mg by mouth daily.   Yes Historical Provider, MD  escitalopram (LEXAPRO) 10 MG tablet Take 10 mg by mouth daily.   Yes Historical Provider, MD  ibuprofen (ADVIL,MOTRIN) 800 MG tablet Take 1 tablet (800 mg total) by mouth 3 (three) times daily. With food 09/18/14  Yes Tammy L. Triplett, PA-C  insulin aspart (NOVOLOG) 100 UNIT/ML injection Please continue her sliding scale as you used to take at home Patient taking differently: Inject 5-35 Units into the skin 3 (three) times daily with meals. Please continue her sliding scale as you used to take at home 11/21/13  Yes Rhetta Mura, MD  insulin detemir (LEVEMIR) 100 UNIT/ML injection Inject 25-35 Units into the skin at bedtime.    Yes Historical Provider, MD  meloxicam (MOBIC) 15 MG tablet Take 15 mg by mouth daily.   Yes Historical Provider, MD  Milnacipran (SAVELLA) 50 MG TABS tablet Take 50 mg by mouth 2 (two) times daily.   Yes Historical Provider, MD  Vitamin D, Ergocalciferol, (DRISDOL) 50000 UNITS CAPS capsule Take 50,000 Units by mouth every 7 (seven) days. Usually takes on Sunday  Yes Historical Provider, MD   Physical Exam: Filed Vitals:   11/02/14 0800 11/02/14 0830 11/02/14 0900  BP: 135/52 143/69 150/72  Pulse: 116 113 114  Resp: 36 27 31  SpO2: 100% 100% 100%    Wt Readings from Last 3 Encounters:  09/18/14 86.183 kg (190 lb)  11/20/13 76 kg (167 lb 8.8 oz)  04/03/13 74.3 kg (163 lb 12.8 oz)    General:  33 year old ill-appearing Caucasian man who is tachypneic from acidosis. Eyes: PERRL, normal lids, irises & conjunctiva; conjunctivae are clear and sclerae are white. ENT:  grossly normal hearing; oropharynx was very dry mucosal membranes-no posterior exudates or erythema. Neck: no LAD, masses or thyromegaly Cardiovascular: S1, S2, with tachycardia. No LE edema. Telemetry: Sinus tachycardia  Respiratory: CTA bilaterally; tachypneic. Abdomen: soft, ntnd; positive bowel sounds, no masses palpated. Skin: Pimples noted on his legs. Musculoskeletal: grossly normal tone BUE/BLE Psychiatric: Ill-appearing; semi-alert; becomes alert and oriented 3. Neurologic: Cranial nerves II through XII are intact.           Labs on Admission:  Basic Metabolic Panel:  Recent Labs Lab 11/02/14 0647  NA 139  K 6.2*  CL 90*  CO2 <7*  GLUCOSE 673*  BUN 20  CREATININE 1.18  CALCIUM 9.6   Liver Function Tests:  Recent Labs Lab 11/02/14 0647  AST 19  ALT 33  ALKPHOS 155*  BILITOT 0.2*  PROT 8.6*  ALBUMIN 4.0    Recent Labs Lab 11/02/14 0647  LIPASE 19   No results for input(s): AMMONIA in the last 168 hours. CBC:  Recent Labs Lab 11/02/14 0647  WBC 21.2*  NEUTROABS 18.4*  HGB 13.9  HCT 43.8  MCV 87.6  PLT 453*   Cardiac Enzymes: No results for input(s): CKTOTAL, CKMB, CKMBINDEX, TROPONINI in the last 168 hours.  BNP (last 3 results) No results for input(s): PROBNP in the last 8760 hours. CBG:  Recent Labs Lab 11/02/14 0602 11/02/14 0752 11/02/14 1020  GLUCAP 573* 586* 402*    Radiological Exams on Admission: Dg Chest Portable 1 View  11/02/2014   CLINICAL DATA:  Hyperglycemia with onset 5 days ago, diffuse pain and vomiting with cough  EXAM: PORTABLE CHEST - 1 VIEW  COMPARISON:  None.  FINDINGS: The heart size and mediastinal contours are within normal limits. Both lungs are clear. The visualized skeletal structures are unremarkable.  IMPRESSION: No active disease.   Electronically Signed   By: Elige KoHetal  Patel   On: 11/02/2014 07:49    EKG: Independently reviewed.   Assessment/Plan Principal Problem:   DKA, type 1 Active Problems:    Hematemesis   Tobacco abuse   Leukocytosis   Hyperkalemia   Chronic back pain   1. The patient is being admitted to the ICU. 2. Start insulin drip protocol. Will check his capillary blood glucose every hour and his basic metabolic panel every 4 hours. Will make adjustments in his IV fluids per the protocol. 3. Continue vigorous IV fluids with normal saline at 200 cc an hour. When his capillary blood glucoses less than 250, will transition IV fluids to D5 half-normal saline. 4. We'll give him Protonix IV every 12 hours. If his hematemesis returns, would consult GI. 5. Nicotine patch. Order tobacco cessation counseling. 6. IV morphine as needed for pain. IV lorazepam as needed for anxiety.    Code Status: Full code DVT Prophylaxis: SCDs Family Communication: Discussed with mother Disposition Plan: Discharge when clinically appropriate.  Time spent: One hour and 10 minutes.  [facility] Institute Surgery Center LLCFISHER,Kashtyn Jankowski [facility] Pager [phone].

## 2014-11-02 NOTE — ED Notes (Signed)
Called pharmacy.  They are bringing insulin down now

## 2014-11-03 DIAGNOSIS — E876 Hypokalemia: Secondary | ICD-10-CM

## 2014-11-03 DIAGNOSIS — K920 Hematemesis: Secondary | ICD-10-CM

## 2014-11-03 DIAGNOSIS — E1010 Type 1 diabetes mellitus with ketoacidosis without coma: Principal | ICD-10-CM

## 2014-11-03 LAB — BASIC METABOLIC PANEL
ANION GAP: 12 (ref 5–15)
Anion gap: 14 (ref 5–15)
BUN: 11 mg/dL (ref 6–23)
BUN: 9 mg/dL (ref 6–23)
CHLORIDE: 107 meq/L (ref 96–112)
CHLORIDE: 107 meq/L (ref 96–112)
CO2: 21 mEq/L (ref 19–32)
CO2: 24 mEq/L (ref 19–32)
Calcium: 7.9 mg/dL — ABNORMAL LOW (ref 8.4–10.5)
Calcium: 7.9 mg/dL — ABNORMAL LOW (ref 8.4–10.5)
Creatinine, Ser: 0.77 mg/dL (ref 0.50–1.35)
Creatinine, Ser: 0.79 mg/dL (ref 0.50–1.35)
GFR calc non Af Amer: >90 mL/min (ref >90)
Glucose, Bld: 152 mg/dL — ABNORMAL HIGH (ref 70–99)
Glucose, Bld: 171 mg/dL — ABNORMAL HIGH (ref 70–99)
POTASSIUM: 3.2 meq/L — AB (ref 3.7–5.3)
Potassium: 3.2 mEq/L — ABNORMAL LOW (ref 3.7–5.3)
SODIUM: 142 meq/L (ref 137–147)
Sodium: 143 mEq/L (ref 137–147)

## 2014-11-03 LAB — GLUCOSE, CAPILLARY
GLUCOSE-CAPILLARY: 143 mg/dL — AB (ref 70–99)
GLUCOSE-CAPILLARY: 144 mg/dL — AB (ref 70–99)
GLUCOSE-CAPILLARY: 152 mg/dL — AB (ref 70–99)
GLUCOSE-CAPILLARY: 155 mg/dL — AB (ref 70–99)
GLUCOSE-CAPILLARY: 162 mg/dL — AB (ref 70–99)
GLUCOSE-CAPILLARY: 190 mg/dL — AB (ref 70–99)
Glucose-Capillary: 125 mg/dL — ABNORMAL HIGH (ref 70–99)
Glucose-Capillary: 152 mg/dL — ABNORMAL HIGH (ref 70–99)
Glucose-Capillary: 178 mg/dL — ABNORMAL HIGH (ref 70–99)
Glucose-Capillary: 87 mg/dL (ref 70–99)
Glucose-Capillary: 89 mg/dL (ref 70–99)
Glucose-Capillary: 122 mg/dL — ABNORMAL HIGH (ref 70–99)

## 2014-11-03 MED ORDER — INSULIN ASPART 100 UNIT/ML ~~LOC~~ SOLN: 0.0000 [IU] | Freq: Every day | SUBCUTANEOUS | Status: DC

## 2014-11-03 MED ORDER — PANTOPRAZOLE SODIUM 40 MG PO TBEC
40.0000 mg | DELAYED_RELEASE_TABLET | Freq: Every day | ORAL | Status: DC
  Administered 2014-11-04: 40 mg via ORAL
  Filled 2014-11-03: qty 1

## 2014-11-03 MED ORDER — INSULIN DETEMIR 100 UNIT/ML ~~LOC~~ SOLN
25.0000 [IU] | Freq: Every day | SUBCUTANEOUS | Status: DC
  Administered 2014-11-03 – 2014-11-04 (×2): 25 [IU] via SUBCUTANEOUS
  Filled 2014-11-03 (×3): qty 0.25

## 2014-11-03 MED ORDER — INSULIN ASPART 100 UNIT/ML ~~LOC~~ SOLN
0.0000 [IU] | Freq: Three times a day (TID) | SUBCUTANEOUS | Status: DC
  Administered 2014-11-04: 11 [IU] via SUBCUTANEOUS
  Administered 2014-11-04: 8 [IU] via SUBCUTANEOUS

## 2014-11-03 MED ORDER — MAGIC MOUTHWASH W/LIDOCAINE: 15.0000 mL | Freq: Three times a day (TID) | ORAL | Status: DC | PRN

## 2014-11-03 MED ORDER — INSULIN ASPART 100 UNIT/ML ~~LOC~~ SOLN
4.0000 [IU] | Freq: Three times a day (TID) | SUBCUTANEOUS | Status: DC
  Administered 2014-11-03 – 2014-11-04 (×3): 4 [IU] via SUBCUTANEOUS

## 2014-11-03 MED ORDER — MAGIC MOUTHWASH
10.0000 mL | Freq: Three times a day (TID) | ORAL | Status: DC | PRN
  Administered 2014-11-03 (×2): 10 mL via ORAL
  Filled 2014-11-03 (×2): qty 10

## 2014-11-03 MED ORDER — POTASSIUM CHLORIDE 10 MEQ/100ML IV SOLN
10.0000 meq | INTRAVENOUS | Status: AC
Start: 2014-11-03 — End: 2014-11-03
  Administered 2014-11-03 (×4): 10 meq via INTRAVENOUS
  Filled 2014-11-03: qty 100

## 2014-11-03 MED ORDER — SODIUM CHLORIDE 0.9 % IV SOLN
INTRAVENOUS | Status: DC
  Administered 2014-11-03 (×2): via INTRAVENOUS

## 2014-11-03 MED ORDER — LIDOCAINE VISCOUS 2 % MT SOLN
5.0000 mL | Freq: Three times a day (TID) | OROMUCOSAL | Status: DC | PRN
  Administered 2014-11-04: 5 mL via OROMUCOSAL
  Filled 2014-11-03: qty 5

## 2014-11-03 NOTE — Care Management Note (Addendum)
    Page 1 of 1   11/04/2014     11:42:04 AM CARE MANAGEMENT NOTE 11/04/2014  Patient:  Tyler Mann,Tyler Mann   Account Number:  [id number]  Date Initiated:  11/03/2014  Documentation initiated by:  CHILDRESS,JESSICA  Subjective/Objective Assessment:   Pt is from home with self care. Pt plans to discharge home with self care. [facility] and to the financial counselor for disability assistance. Will continue to follow for CM needs.     Action/Plan:   Anticipated DC Date:  11/04/2014   Anticipated DC Plan:  HOME/SELF CARE  In-house referral  Financial Counselor      DC Planning Services  CM consult  Other      Choice offered to / List presented to:             Status of service:  Completed, signed off Medicare Important Message given?   (If response is "NO", the following Medicare IM given date fields will be blank) Date Medicare IM given:   Medicare IM given by:   Date Additional Medicare IM given:   Additional Medicare IM given by:    Discharge Disposition:  HOME/SELF CARE  Per UR Regulation:  Reviewed for med. necessity/level of care/duration of stay  If discussed at Long Length of Stay Meetings, dates discussed:    Comments:  11/04/14 1140 Arlyss Queenammy Bailley [place], RN BSN CM Pt discharged home today. No CM needs noted.  11/03/2014 1200 Kathyrn SheriffJessica Childress, RN, MSN, [facility]

## 2014-11-03 NOTE — Progress Notes (Signed)
All dka goals met. Insulin drip is off.

## 2014-11-03 NOTE — Progress Notes (Signed)
[facility] PROGRESS NOTE  Paulo FruitJohn E [place] QIO:[id number] DOB: 03/11/1981 DOA: 11/02/2014 PCP: Catalina PizzaHALL, ZACH, MD  Assessment/Plan: DKA -Resolved as per this am's BMET. -Will give levemir to transition off drip. -Start home dose of levemir and SSI. -Transition fluids to saline.  Hematemesis -Likely a small MW tear from retching. -Hb stable on admission. -Recheck in am. -See no need for GI consult. -Transition protonix to PO.  Code Status: Full Code Family Communication: Patient only  Disposition Plan: to med surg once off drip. Anticipate DC home in am.   Consultants:  None   Antibiotics:  None   Subjective: No complaints. Feels better.  Objective: Filed Vitals:   11/03/14 0600 11/03/14 0700 11/03/14 0800 11/03/14 0900  BP: 129/71 113/47 128/66 116/51  Pulse: 84 78 81 89  Temp:   97.9 F (36.6 C)   TempSrc:   Oral   Resp: 15 10 9 22   SpO2: 99% 100% 100% 100%    Intake/Output Summary (Last 24 hours) at 11/03/14 1003 Last data filed at 11/03/14 0932  Gross per 24 hour  Intake 4356.46 ml  Output   1000 ml  Net 3356.46 ml   There were no vitals filed for this visit.  Exam:   General:  AA Ox3  Cardiovascular: RRR, no M/R/G  Respiratory: CTA B  Abdomen: S/NT/ND/+BS  Extremities: no C/C/E   Neurologic:  Intact/non-focal  Data Reviewed: Basic Metabolic Panel:  Recent Labs Lab 11/02/14 1452 11/02/14 1900 11/02/14 2254 11/03/14 0300 11/03/14 0724  NA 144 145 145 142 143  K 4.8 4.2 3.7 3.2* 3.2*  CL 109 109 109 107 107  CO2 12* 19 21 21 24   GLUCOSE 203* 169* 152* 152* 171*  BUN 16 15 13 11 9   CREATININE 0.93 0.93 0.90 0.77 0.79  CALCIUM 7.9* 7.9* 7.9* 7.9* 7.9*   Liver Function Tests:  Recent Labs Lab 11/02/14 0647  AST 19  ALT 33  ALKPHOS 155*  BILITOT 0.2*  PROT 8.6*  ALBUMIN 4.0    Recent Labs Lab 11/02/14 0647  LIPASE 19   No results for input(s): AMMONIA in the last 168 hours. CBC:  Recent Labs Lab  11/02/14 0647  WBC 21.2*  NEUTROABS 18.4*  HGB 13.9  HCT 43.8  MCV 87.6  PLT 453*   Cardiac Enzymes: No results for input(s): CKTOTAL, CKMB, CKMBINDEX, TROPONINI in the last 168 hours. BNP (last 3 results) No results for input(s): PROBNP in the last 8760 hours. CBG:  Recent Labs Lab 11/03/14 0519 11/03/14 0633 11/03/14 0725 11/03/14 0832 11/03/14 0932  GLUCAP 143* 125* 152* 178* 155*    Recent Results (from the past 240 hour(s))  MRSA PCR Screening     Status: None   Collection Time: 11/02/14 10:35 AM  Result Value Ref Range Status   MRSA by PCR NEGATIVE NEGATIVE Final    Comment:        The GeneXpert MRSA Assay (FDA approved for NASAL specimens only), is one component of a comprehensive MRSA colonization surveillance program. It is not intended to diagnose MRSA infection nor to guide or monitor treatment for MRSA infections.      Studies: Dg Chest Portable 1 View  11/02/2014   CLINICAL DATA:  Hyperglycemia with onset 5 days ago, diffuse pain and vomiting with cough  EXAM: PORTABLE CHEST - 1 VIEW  COMPARISON:  None.  FINDINGS: The heart size and mediastinal contours are within normal limits. Both lungs are clear. The visualized skeletal structures are unremarkable.  IMPRESSION: No active disease.   Electronically Signed   By: Elige KoHetal  Patel   On: 11/02/2014 07:49    Scheduled Meds: . antiseptic oral rinse  7 mL Mouth Rinse q12n4p  . chlorhexidine  15 mL Mouth Rinse BID  . insulin aspart  0-15 Units Subcutaneous TID WC  . insulin aspart  0-5 Units Subcutaneous QHS  . insulin aspart  4 Units Subcutaneous TID WC  . insulin detemir  25 Units Subcutaneous Daily  . nicotine  21 mg Transdermal Daily  . ondansetron (ZOFRAN) IV  4 mg Intravenous 4 times per day  . pantoprazole (PROTONIX) IV  40 mg Intravenous Q12H  . potassium chloride  10 mEq Intravenous Q1 Hr x 4   Continuous Infusions: . sodium chloride 100 mL/hr at 11/03/14 0828  . insulin (NOVOLIN-R) infusion  1 Units/hr (11/03/14 0932)    Principal Problem:   DKA, type 1 Active Problems:   Tobacco abuse   Leukocytosis   Hyperkalemia   Chronic back pain   Hematemesis   Generalized anxiety disorder   Hypokalemia    Time spent: 35 minutes. Greater than 50% of this time was spent in direct contact with the patient coordinating care.    Chaya JanHERNANDEZ ACOSTA,ESTELA  [facility] Pager [phone]  If 7PM-7AM, please contact night-coverage at [email or web address], password [email or web address] 11/03/2014, 10:03 AM  LOS: 1 day

## 2014-11-03 NOTE — Plan of Care (Signed)
Problem: Discharge Progression Outcomes Goal: Obtain signed CBG meter Rx form Outcome: Completed/Met Date Met:  11/03/14 Pt already has glucose machine Goal: Hemodynamically stable Outcome: Completed/Met Date Met:  11/03/14 Vital signs are stable     

## 2014-11-03 NOTE — Progress Notes (Signed)
Called report to Cyndia DiverLindsi Forte, RN on dept 300.  Verbalized understanding.  Pt transferred to floor in safe and stable condition. Schonewitz, Candelaria StagersLeigh Anne 11/03/2014

## 2014-11-04 LAB — BASIC METABOLIC PANEL
ANION GAP: 13 (ref 5–15)
BUN: 7 mg/dL (ref 6–23)
CALCIUM: 8.2 mg/dL — AB (ref 8.4–10.5)
CHLORIDE: 103 meq/L (ref 96–112)
CO2: 25 meq/L (ref 19–32)
CREATININE: 0.66 mg/dL (ref 0.50–1.35)
GFR calc Af Amer: >90 mL/min (ref >90)
GFR calc non Af Amer: >90 mL/min (ref >90)
GLUCOSE: 301 mg/dL — AB (ref 70–99)
Potassium: 3.6 mEq/L — ABNORMAL LOW (ref 3.7–5.3)
Sodium: 141 mEq/L (ref 137–147)

## 2014-11-04 LAB — CBC
HEMATOCRIT: 32 % — AB (ref 39.0–52.0)
HEMOGLOBIN: 10.2 g/dL — AB (ref 13.0–17.0)
MCH: 27.1 pg (ref 26.0–34.0)
MCHC: 31.9 g/dL (ref 30.0–36.0)
MCV: 84.9 fL (ref 78.0–100.0)
Platelets: 272 10*3/uL (ref 150–400)
RBC: 3.77 MIL/uL — ABNORMAL LOW (ref 4.22–5.81)
RDW: 18.2 % — AB (ref 11.5–15.5)
WBC: 7 10*3/uL (ref 4.0–10.5)

## 2014-11-04 LAB — GLUCOSE, CAPILLARY
GLUCOSE-CAPILLARY: 436 mg/dL — AB (ref 70–99)
Glucose-Capillary: 296 mg/dL — ABNORMAL HIGH (ref 70–99)
Glucose-Capillary: 329 mg/dL — ABNORMAL HIGH (ref 70–99)

## 2014-11-04 MED ORDER — INSULIN DETEMIR 100 UNIT/ML ~~LOC~~ SOLN: 45.0000 [IU] | Freq: Every day | SUBCUTANEOUS | Status: DC

## 2014-11-04 MED ORDER — CHLORHEXIDINE GLUCONATE 0.12 % MT SOLN: 15.0000 mL | Freq: Two times a day (BID) | OROMUCOSAL | Status: DC

## 2014-11-04 NOTE — Progress Notes (Signed)
Discharged home with instructions given on medications,and follow up visits,patient verbalized understanding. Prescriptions sent to Pharmacy of choice documented on AVS.Vital signs stable. Accompanied by staff to an awaiting vehicle.

## 2014-11-04 NOTE — Discharge Summary (Signed)
Physician Discharge Summary  Tyler FruitJohn E Hestand QIH:[id number] DOB: 1981/10/22 DOA: 11/02/2014  PCP: Catalina PizzaHALL, ZACH, MD  Admit date: 11/02/2014 Discharge date: 11/04/2014  Time spent: 45 minutes  Recommendations for Outpatient Follow-up:  -Will be discharged home today. -Advised to follow up with PCP in 1 week.   Discharge Diagnoses:  Principal Problem:   DKA, type 1 Active Problems:   Tobacco abuse   Leukocytosis   Hyperkalemia   Chronic back pain   Hematemesis   Generalized anxiety disorder   Hypokalemia   Discharge Condition: Stable and improved  Filed Weights   11/04/14 0556  Weight: 72.576 kg (160 lb)    History of present illness:  Tyler Mann is a 33 y.o. male with a history of type 1 diabetes, diagnosed at 33 years of age. He also has a history of DKA, chronic back pain, and chronic anxiety. He presents to the emergency department with a five-day history, of feeling weak, shortness of breath, nausea, and vomiting. Over the last 3 days, he has vomited multiple times. Some of his emesis had a small amount of blood in it. He denies abdominal pain, chest congestion, fever, chills, pain with urination, or unusual swelling in his extremities. He does have chronic low back pain which worsens with DKA. He denies missing any of his insulin: He reports taking 20-40 units of Levemir twice daily and sliding scale NovoLog.Marland Kitchen. He reports increased stress at home.  In the ED, he was tachycardic and tachypnea. His blood pressure was within normal limits. His chest x-ray is nonacute. His urinalysis reveals greater than 80 ketones, but no WBCs or bacteria. His lab data are significant for a glucose of 673, CO2 of less than 7, WBC of 21.2, and ABG with a pH of 7.05. He is being admitted for further evaluation and management.  Hospital Course:   DKA -Resolved. -Discussed compliance and improved DM management. -He tells me he has been trying to get on the pump but has encountered some  resistance from payer sources.  Hematemesis -Likely a small MW tear from retching. -Hb stable at 10.2 -no need for transfusion.   Procedures:  None   Consultations:  None  Discharge Instructions  Discharge Instructions    Diet Carb Modified    Complete by:  As directed      Increase activity slowly    Complete by:  As directed             Medication List    STOP taking these medications        ibuprofen 800 MG tablet  Commonly known as:  ADVIL,MOTRIN      TAKE these medications        ALPRAZolam 0.25 MG tablet  Commonly known as:  XANAX  Take 0.25 mg by mouth 2 (two) times daily.     chlorhexidine 0.12 % solution  Commonly known as:  PERIDEX  15 mLs by Mouth Rinse route 2 (two) times daily.     cyclobenzaprine 10 MG tablet  Commonly known as:  FLEXERIL  Take 20 mg by mouth daily.     escitalopram 10 MG tablet  Commonly known as:  LEXAPRO  Take 10 mg by mouth daily.     insulin aspart 100 UNIT/ML injection  Commonly known as:  novoLOG  Please continue her sliding scale as you used to take at home     insulin detemir 100 UNIT/ML injection  Commonly known as:  LEVEMIR  Inject 0.45 mLs (45  Units total) into the skin at bedtime.     meloxicam 15 MG tablet  Commonly known as:  MOBIC  Take 15 mg by mouth daily.     Milnacipran 50 MG Tabs tablet  Commonly known as:  SAVELLA  Take 50 mg by mouth 2 (two) times daily.     Vitamin D (Ergocalciferol) 50000 UNITS Caps capsule  Commonly known as:  DRISDOL  Take 50,000 Units by mouth every 7 (seven) days. Usually takes on Sunday       No Known Allergies     Follow-up Information    Follow up with Catalina PizzaHALL, ZACH, MD. Schedule an appointment as soon as possible for a visit in 1 week.   Specialty:  Internal Medicine   Contact information:    [address]  [place] [address] [phone]        The results of significant diagnostics from this hospitalization (including imaging, microbiology,  ancillary and laboratory) are listed below for reference.    Significant Diagnostic Studies: Dg Chest Portable 1 View  11/02/2014   CLINICAL DATA:  Hyperglycemia with onset 5 days ago, diffuse pain and vomiting with cough  EXAM: PORTABLE CHEST - 1 VIEW  COMPARISON:  None.  FINDINGS: The heart size and mediastinal contours are within normal limits. Both lungs are clear. The visualized skeletal structures are unremarkable.  IMPRESSION: No active disease.   Electronically Signed   By: Elige KoHetal  Patel   On: 11/02/2014 07:49    Microbiology: Recent Results (from the past 240 hour(s))  MRSA PCR Screening     Status: None   Collection Time: 11/02/14 10:35 AM  Result Value Ref Range Status   MRSA by PCR NEGATIVE NEGATIVE Final    Comment:        The GeneXpert MRSA Assay (FDA approved for NASAL specimens only), is one component of a comprehensive MRSA colonization surveillance program. It is not intended to diagnose MRSA infection nor to guide or monitor treatment for MRSA infections.      Labs: Basic Metabolic Panel:  Recent Labs Lab 11/02/14 1900 11/02/14 2254 11/03/14 0300 11/03/14 0724 11/04/14 0632  NA 145 145 142 143 141  K 4.2 3.7 3.2* 3.2* 3.6*  CL 109 109 107 107 103  CO2 19 21 21 24 25   GLUCOSE 169* 152* 152* 171* 301*  BUN 15 13 11 9 7   CREATININE 0.93 0.90 0.77 0.79 0.66  CALCIUM 7.9* 7.9* 7.9* 7.9* 8.2*   Liver Function Tests:  Recent Labs Lab 11/02/14 0647  AST 19  ALT 33  ALKPHOS 155*  BILITOT 0.2*  PROT 8.6*  ALBUMIN 4.0    Recent Labs Lab 11/02/14 0647  LIPASE 19   No results for input(s): AMMONIA in the last 168 hours. CBC:  Recent Labs Lab 11/02/14 0647 11/04/14 0632  WBC 21.2* 7.0  NEUTROABS 18.4*  --   HGB 13.9 10.2*  HCT 43.8 32.0*  MCV 87.6 84.9  PLT 453* 272   Cardiac Enzymes: No results for input(s): CKTOTAL, CKMB, CKMBINDEX, TROPONINI in the last 168 hours. BNP: BNP (last 3 results) No results for input(s): PROBNP in the  last 8760 hours. CBG:  Recent Labs Lab 11/03/14 1630 11/03/14 2057 11/04/14 0734 11/04/14 1024 11/04/14 1217  GLUCAP 89 122* 296* 436* 329*       Signed:  HERNANDEZ ACOSTA,Leonardo Makris  [facility] Pager: [phone] 11/04/2014, 3:21 PM

## 2014-11-04 NOTE — Progress Notes (Signed)
Spoke with patient about diabetes and home regimen for diabetes control. Patient reports that he has Type 1 diabetes and is followed by his PCP for diabetes management and currently he takes Levemir 25-35 units QHS and Novolog 5-35 units TID with meals as an outpatient for diabetes control. Inquired about why patient felt he went into DKA and he reports that he feels it was due to a high level of stress. Patient reports that he is compliant with insulin regimen but admits that he may forget his insulin from time to time. Patient states that he sets alarm reminders on his phone to help him remember to take his insulin and that his girlfriend is good about helping him remember to check his glucose and take his insulin injections. Patient reports that generally his diabetes is fairly well controlled. Discussed A1C results (10.5% on 11/02/14) and stressed importance of checking CBGs and maintaining good CBG control to prevent long-term and short-term complications. Discussed impact of nutrition, exercise, stress, sickness, and medications on diabetes control.  Patient states that he follows a diabetic diet for the most part.  According to the patient he has been under a great deal of stress lately and during our conversation he stated that he may be going to jail soon but he did not elaborate on why. Inquired about access to medications and testing supplies and patient states that he has everything he needs to manage his diabetes. Encouraged patient to check his glucose 3-4 times per day and take his insulin as prescribed and to remember to check his glucose more frequently if he is sick or stressed.  Patient verbalized understanding of information discussed and he states that he has no further questions at this time related to diabetes.   Thanks, Orlando PennerMarie Issaih Kaus, RN, MSN, CCRN Diabetes Coordinator Inpatient Diabetes [facility] [phone] (Team Pager) [phone] ([facility] office) [phone] [facility] office)

## 2014-12-03 ENCOUNTER — Inpatient Hospital Stay (HOSPITAL_COMMUNITY)
Admission: EM | Admit: 2014-12-03 | Discharge: 2014-12-07 | DRG: 638 | Disposition: A | Discharge status: Other Acute Inpatient Hospital | Payer: BLUE CROSS/BLUE SHIELD | Attending: Internal Medicine | Admitting: Internal Medicine

## 2014-12-03 ENCOUNTER — Encounter (HOSPITAL_COMMUNITY): Payer: Self-pay | Admitting: *Deleted

## 2014-12-03 DIAGNOSIS — R45851 Suicidal ideations: Secondary | ICD-10-CM | POA: Diagnosis present

## 2014-12-03 DIAGNOSIS — R059 Cough, unspecified: Secondary | ICD-10-CM

## 2014-12-03 DIAGNOSIS — E871 Hypo-osmolality and hyponatremia: Secondary | ICD-10-CM | POA: Diagnosis not present

## 2014-12-03 DIAGNOSIS — R739 Hyperglycemia, unspecified: Secondary | ICD-10-CM | POA: Diagnosis present

## 2014-12-03 DIAGNOSIS — E1010 Type 1 diabetes mellitus with ketoacidosis without coma: Principal | ICD-10-CM | POA: Diagnosis present

## 2014-12-03 DIAGNOSIS — Z9114 Patient's other noncompliance with medication regimen: Secondary | ICD-10-CM | POA: Diagnosis present

## 2014-12-03 DIAGNOSIS — F1910 Other psychoactive substance abuse, uncomplicated: Secondary | ICD-10-CM | POA: Diagnosis not present

## 2014-12-03 DIAGNOSIS — E860 Dehydration: Secondary | ICD-10-CM | POA: Diagnosis present

## 2014-12-03 DIAGNOSIS — F17210 Nicotine dependence, cigarettes, uncomplicated: Secondary | ICD-10-CM | POA: Diagnosis present

## 2014-12-03 DIAGNOSIS — E162 Hypoglycemia, unspecified: Secondary | ICD-10-CM | POA: Diagnosis not present

## 2014-12-03 DIAGNOSIS — D72829 Elevated white blood cell count, unspecified: Secondary | ICD-10-CM | POA: Diagnosis present

## 2014-12-03 DIAGNOSIS — E1065 Type 1 diabetes mellitus with hyperglycemia: Secondary | ICD-10-CM | POA: Diagnosis not present

## 2014-12-03 DIAGNOSIS — F1110 Opioid abuse, uncomplicated: Secondary | ICD-10-CM | POA: Diagnosis present

## 2014-12-03 DIAGNOSIS — Z9111 Patient's noncompliance with dietary regimen: Secondary | ICD-10-CM | POA: Diagnosis present

## 2014-12-03 DIAGNOSIS — N179 Acute kidney failure, unspecified: Secondary | ICD-10-CM | POA: Diagnosis present

## 2014-12-03 DIAGNOSIS — F419 Anxiety disorder, unspecified: Secondary | ICD-10-CM

## 2014-12-03 DIAGNOSIS — Z609 Problem related to social environment, unspecified: Secondary | ICD-10-CM | POA: Diagnosis present

## 2014-12-03 DIAGNOSIS — Z794 Long term (current) use of insulin: Secondary | ICD-10-CM | POA: Diagnosis not present

## 2014-12-03 DIAGNOSIS — R05 Cough: Secondary | ICD-10-CM

## 2014-12-03 DIAGNOSIS — E876 Hypokalemia: Secondary | ICD-10-CM | POA: Diagnosis not present

## 2014-12-03 DIAGNOSIS — Z599 Problem related to housing and economic circumstances, unspecified: Secondary | ICD-10-CM

## 2014-12-03 LAB — BASIC METABOLIC PANEL
ANION GAP: 13 (ref 5–15)
Anion gap: 21 — ABNORMAL HIGH (ref 5–15)
BUN: 18 mg/dL (ref 6–23)
BUN: 18 mg/dL (ref 6–23)
CO2: 11 mmol/L — ABNORMAL LOW (ref 19–32)
CO2: 18 mmol/L — ABNORMAL LOW (ref 19–32)
CREATININE: 1.29 mg/dL (ref 0.50–1.35)
Calcium: 8.4 mg/dL (ref 8.4–10.5)
Calcium: 8.4 mg/dL (ref 8.4–10.5)
Chloride: 104 mEq/L (ref 96–112)
Chloride: 103 mEq/L (ref 96–112)
Creatinine, Ser: 1.24 mg/dL (ref 0.50–1.35)
GFR calc Af Amer: 86 mL/min — ABNORMAL LOW (ref >90)
GFR calc non Af Amer: 71 mL/min — ABNORMAL LOW (ref >90)
GFR calc non Af Amer: 74 mL/min — ABNORMAL LOW (ref >90)
GFR, EST AFRICAN AMERICAN: 82 mL/min — AB (ref >90)
GLUCOSE: 406 mg/dL — AB (ref 70–99)
GLUCOSE: 299 mg/dL — AB (ref 70–99)
POTASSIUM: 4.5 mmol/L (ref 3.5–5.1)
Potassium: 4.8 mmol/L (ref 3.5–5.1)
SODIUM: 136 mmol/L (ref 135–145)
Sodium: 134 mmol/L — ABNORMAL LOW (ref 135–145)

## 2014-12-03 LAB — CBC WITH DIFFERENTIAL/PLATELET
Basophils Absolute: 0 10*3/uL (ref 0.0–0.1)
Basophils Relative: 0 % (ref 0–1)
Eosinophils Absolute: 0.1 10*3/uL (ref 0.0–0.7)
Eosinophils Relative: 1 % (ref 0–5)
HEMATOCRIT: 28.6 % — AB (ref 39.0–52.0)
Hemoglobin: 9.3 g/dL — ABNORMAL LOW (ref 13.0–17.0)
Lymphocytes Relative: 10 % — ABNORMAL LOW (ref 12–46)
Lymphs Abs: 1.6 10*3/uL (ref 0.7–4.0)
MCH: 28.6 pg (ref 26.0–34.0)
MCHC: 32.5 g/dL (ref 30.0–36.0)
MCV: 88 fL (ref 78.0–100.0)
Monocytes Absolute: 1.2 10*3/uL — ABNORMAL HIGH (ref 0.1–1.0)
Monocytes Relative: 7 % (ref 3–12)
Neutro Abs: 13.3 10*3/uL — ABNORMAL HIGH (ref 1.7–7.7)
Neutrophils Relative %: 82 % — ABNORMAL HIGH (ref 43–77)
Platelets: 385 10*3/uL (ref 150–400)
RBC: 3.25 MIL/uL — ABNORMAL LOW (ref 4.22–5.81)
RDW: 17 % — ABNORMAL HIGH (ref 11.5–15.5)
WBC: 16.3 10*3/uL — AB (ref 4.0–10.5)

## 2014-12-03 LAB — URINALYSIS, ROUTINE W REFLEX MICROSCOPIC
BILIRUBIN URINE: NEGATIVE
HGB URINE DIPSTICK: NEGATIVE
Leukocytes, UA: NEGATIVE
Nitrite: NEGATIVE
Protein, ur: NEGATIVE mg/dL
Specific Gravity, Urine: 1.015 (ref 1.005–1.030)
UROBILINOGEN UA: 0.2 mg/dL (ref 0.0–1.0)
pH: 5.5 (ref 5.0–8.0)

## 2014-12-03 LAB — COMPREHENSIVE METABOLIC PANEL
ALT: 22 U/L (ref 0–53)
AST: 22 U/L (ref 0–37)
Albumin: 3.3 g/dL — ABNORMAL LOW (ref 3.5–5.2)
Alkaline Phosphatase: 178 U/L — ABNORMAL HIGH (ref 39–117)
Anion gap: 18 — ABNORMAL HIGH (ref 5–15)
BUN: 19 mg/dL (ref 6–23)
CO2: 19 mmol/L (ref 19–32)
CREATININE: 1.34 mg/dL (ref 0.50–1.35)
Calcium: 8.7 mg/dL (ref 8.4–10.5)
Chloride: 97 mEq/L (ref 96–112)
GFR, EST AFRICAN AMERICAN: 78 mL/min — AB (ref >90)
GFR, EST NON AFRICAN AMERICAN: 67 mL/min — AB (ref >90)
Glucose, Bld: 388 mg/dL — ABNORMAL HIGH (ref 70–99)
POTASSIUM: 4.8 mmol/L (ref 3.5–5.1)
Sodium: 134 mmol/L — ABNORMAL LOW (ref 135–145)
Total Bilirubin: 1.4 mg/dL — ABNORMAL HIGH (ref 0.3–1.2)
Total Protein: 7.4 g/dL (ref 6.0–8.3)

## 2014-12-03 LAB — BLOOD GAS, VENOUS
Acid-base deficit: 9.3 mmol/L — ABNORMAL HIGH (ref 0.0–2.0)
Bicarbonate: 14.9 mEq/L — ABNORMAL LOW (ref 20.0–24.0)
FIO2: 21 %
O2 Saturation: 97 %
PCO2 VEN: 26.3 mmHg — AB (ref 45.0–50.0)
Patient temperature: 37
TCO2: 14 mmol/L (ref 0–100)
pH, Ven: 7.373 — ABNORMAL HIGH (ref 7.250–7.300)
pO2, Ven: 100 mmHg — ABNORMAL HIGH (ref 30.0–45.0)

## 2014-12-03 LAB — CBG MONITORING, ED
GLUCOSE-CAPILLARY: 386 mg/dL — AB (ref 70–99)
Glucose-Capillary: 367 mg/dL — ABNORMAL HIGH (ref 70–99)
Glucose-Capillary: 369 mg/dL — ABNORMAL HIGH (ref 70–99)
Glucose-Capillary: 278 mg/dL — ABNORMAL HIGH (ref 70–99)

## 2014-12-03 LAB — GLUCOSE, CAPILLARY
Glucose-Capillary: 286 mg/dL — ABNORMAL HIGH (ref 70–99)
Glucose-Capillary: 241 mg/dL — ABNORMAL HIGH (ref 70–99)
Glucose-Capillary: 146 mg/dL — ABNORMAL HIGH (ref 70–99)
Glucose-Capillary: 175 mg/dL — ABNORMAL HIGH (ref 70–99)

## 2014-12-03 LAB — RAPID URINE DRUG SCREEN, HOSP PERFORMED
Amphetamines: POSITIVE — AB
Barbiturates: NOT DETECTED
Benzodiazepines: NOT DETECTED
Cocaine: NOT DETECTED
OPIATES: NOT DETECTED
Tetrahydrocannabinol: NOT DETECTED

## 2014-12-03 LAB — URINE MICROSCOPIC-ADD ON

## 2014-12-03 MED ORDER — SODIUM CHLORIDE 0.9 % IV SOLN: INTRAVENOUS | Status: AC

## 2014-12-03 MED ORDER — MELOXICAM 7.5 MG PO TABS
15.0000 mg | ORAL_TABLET | Freq: Every day | ORAL | Status: DC
  Administered 2014-12-03 – 2014-12-07 (×5): 15 mg via ORAL
  Filled 2014-12-03 (×4): qty 1
  Filled 2014-12-03 (×2): qty 2

## 2014-12-03 MED ORDER — PNEUMOCOCCAL VAC POLYVALENT 25 MCG/0.5ML IJ INJ
0.5000 mL | INJECTION | INTRAMUSCULAR | Status: DC
  Filled 2014-12-03: qty 0.5

## 2014-12-03 MED ORDER — DEXTROSE-NACL 5-0.45 % IV SOLN
INTRAVENOUS | Status: DC
  Administered 2014-12-03: 1000 mL via INTRAVENOUS

## 2014-12-03 MED ORDER — CHLORHEXIDINE GLUCONATE 0.12 % MT SOLN
15.0000 mL | Freq: Two times a day (BID) | OROMUCOSAL | Status: DC
  Administered 2014-12-03 – 2014-12-07 (×3): 15 mL via OROMUCOSAL
  Filled 2014-12-03 (×12): qty 15

## 2014-12-03 MED ORDER — ENSURE COMPLETE PO LIQD: 237.0000 mL | Freq: Two times a day (BID) | ORAL | Status: DC

## 2014-12-03 MED ORDER — ENOXAPARIN SODIUM 40 MG/0.4ML ~~LOC~~ SOLN
40.0000 mg | SUBCUTANEOUS | Status: DC
  Administered 2014-12-03: 40 mg via SUBCUTANEOUS
  Filled 2014-12-03: qty 0.4

## 2014-12-03 MED ORDER — INSULIN DETEMIR 100 UNIT/ML ~~LOC~~ SOLN
45.0000 [IU] | Freq: Once | SUBCUTANEOUS | Status: DC
  Filled 2014-12-03: qty 0.45

## 2014-12-03 MED ORDER — POTASSIUM CHLORIDE 10 MEQ/100ML IV SOLN
10.0000 meq | INTRAVENOUS | Status: AC
  Administered 2014-12-03 (×2): 10 meq via INTRAVENOUS
  Filled 2014-12-03: qty 100

## 2014-12-03 MED ORDER — INFLUENZA VAC SPLIT QUAD 0.5 ML IM SUSY
0.5000 mL | PREFILLED_SYRINGE | INTRAMUSCULAR | Status: DC
  Filled 2014-12-03: qty 0.5

## 2014-12-03 MED ORDER — MILNACIPRAN HCL 50 MG PO TABS
50.0000 mg | ORAL_TABLET | Freq: Two times a day (BID) | ORAL | Status: DC
  Administered 2014-12-04 – 2014-12-07 (×7): 50 mg via ORAL
  Filled 2014-12-03 (×9): qty 1

## 2014-12-03 MED ORDER — ALPRAZOLAM 0.5 MG PO TABS
0.5000 mg | ORAL_TABLET | Freq: Two times a day (BID) | ORAL | Status: DC
  Administered 2014-12-03 – 2014-12-04 (×2): 0.5 mg via ORAL
  Filled 2014-12-03 (×2): qty 1

## 2014-12-03 MED ORDER — GUAIFENESIN ER 600 MG PO TB12
600.0000 mg | ORAL_TABLET | Freq: Two times a day (BID) | ORAL | Status: AC
  Administered 2014-12-03 – 2014-12-04 (×2): 600 mg via ORAL
  Filled 2014-12-03 (×2): qty 1

## 2014-12-03 MED ORDER — SODIUM CHLORIDE 0.9 % IV SOLN
INTRAVENOUS | Status: DC
  Filled 2014-12-03: qty 2.5

## 2014-12-03 MED ORDER — DEXTROSE-NACL 5-0.45 % IV SOLN: INTRAVENOUS | Status: DC

## 2014-12-03 MED ORDER — SODIUM CHLORIDE 0.9 % IV BOLUS (SEPSIS)
2000.0000 mL | Freq: Once | INTRAVENOUS | Status: AC
  Administered 2014-12-03: 2000 mL via INTRAVENOUS

## 2014-12-03 MED ORDER — SODIUM CHLORIDE 0.9 % IV SOLN
INTRAVENOUS | Status: DC
  Administered 2014-12-03: 500 mL via INTRAVENOUS

## 2014-12-03 MED ORDER — INSULIN REGULAR HUMAN 100 UNIT/ML IJ SOLN
INTRAMUSCULAR | Status: DC
  Administered 2014-12-03: 3.5 [IU]/h via INTRAVENOUS
  Administered 2014-12-03: 6.8 [IU]/h via INTRAVENOUS
  Administered 2014-12-04: 3.3 [IU]/h via INTRAVENOUS
  Filled 2014-12-03: qty 2.5

## 2014-12-03 MED ORDER — MELOXICAM 7.5 MG PO TABS
ORAL_TABLET | ORAL | Status: AC
  Filled 2014-12-03: qty 2

## 2014-12-03 NOTE — ED Notes (Addendum)
[facility], pt admits using heroin yesterday, methamphetamine this morning. Pt is also a diabetic. Pt states he checked his sugar this morning and it was 212, at triage CBG is 337. Pt reluctant to receive care.

## 2014-12-03 NOTE — ED Notes (Signed)
Pt is well not respond when ask his name or open eyes.  Father at is bedside and pt will not respond to father as well.

## 2014-12-03 NOTE — ED Notes (Signed)
CRITICAL VALUE ALERT  Critical value received:  PH 7.373, CO2 26.3, PO2 100, Bicarb 100, SATs 97%  Date of notification:  12/03/14  Time of notification:  1340  Critical value read back:Yes.    Nurse who received alert:  Dorris Fetchaphyne Anderson, RN  MD notified (1st page):  Dr Criss AlvineGoldston  Time of first page:  1344

## 2014-12-03 NOTE — H&P (Signed)
[facility] History and Physical  Tyler Mann ZOX:[id number] DOB: 10-29-1981 DOA: 12/03/2014  Referring physician: Dr. Criss Alvine, ER  PCP: Catalina Pizza, MD   Chief Complaint: detox  HPI: Tyler Mann is a 34 y.o. male with a history of type 1 diabetes who presents to the emergency room with his family for detox. Patient has a history of substance abuse and admits to using heroin as well as methamphetamine. He is not forthcoming about the frequency of history and use, reporting that the last time he used any drugs was approximately 3-4 days ago. His father reports that he last used heroin earlier on the day of admission. Patient has a complex social situation where his parents had did not allow him to return home after they discovered his history of substance abuse. For the last several weeks, patient has been living with friends and has been inconsistently taking his medications, also noncompliant with his diet. He had told his parents that he wishes to quit all of his substance abuse and detox. He presented to [facility] earlier today for evaluation regarding a detox program, but was told to come to the emergency room for hospital evaluation. Initial blood work indicated that he has mild DKA and elevated blood sugar. Patient denies any recent fever, nausea, vomiting, diarrhea. He has had no abdominal pain. No shortness of breath or cough. He otherwise feels in his usual state of health. He'll be admitted further treatments.   Review of Systems:  Pertinent positives as per HPI, otherwise negative  Past Medical History  Diagnosis Date  . Diabetes mellitus   . Neuropathy   . Narcotic addiction     Hx IV heroin abuse  . Chronic back pain    Past Surgical History  Procedure Laterality Date  . Mouth surgery     Social History:  reports that he has been smoking Cigarettes.  He has a 17 pack-year smoking history. He uses smokeless tobacco. He reports that he uses illicit drugs (Opium).  He reports that he does not drink alcohol.  No Known Allergies  Family history: no diabetes in the family.   Prior to Admission medications   Medication Sig Start Date End Date Taking? Authorizing Provider  ALPRAZolam Prudy Feeler) 0.5 MG tablet Take 0.5 mg by mouth 2 (two) times daily. 11/20/14   Historical Provider, MD  chlorhexidine (PERIDEX) 0.12 % solution 15 mLs by Mouth Rinse route 2 (two) times daily. 11/04/14   Henderson Cloud, MD  cyclobenzaprine (FLEXERIL) 10 MG tablet Take 20 mg by mouth daily.    Historical Provider, MD  escitalopram (LEXAPRO) 10 MG tablet Take 10 mg by mouth daily.    Historical Provider, MD  insulin aspart (NOVOLOG) 100 UNIT/ML injection Please continue her sliding scale as you used to take at home Patient taking differently: Inject 5-35 Units into the skin 3 (three) times daily with meals. Please continue her sliding scale as you used to take at home 11/21/13   Rhetta Mura, MD  insulin detemir (LEVEMIR) 100 UNIT/ML injection Inject 0.45 mLs (45 Units total) into the skin at bedtime. 11/04/14   Henderson Cloud, MD  meloxicam (MOBIC) 15 MG tablet Take 15 mg by mouth daily.    Historical Provider, MD  Milnacipran (SAVELLA) 50 MG TABS tablet Take 50 mg by mouth 2 (two) times daily.    Historical Provider, MD  Vitamin D, Ergocalciferol, (DRISDOL) 50000 UNITS CAPS capsule Take 50,000 Units by mouth every 7 (seven) days. Usually takes  on Sunday    Historical Provider, MD   Physical Exam: Filed Vitals:   12/03/14 1106 12/03/14 1418  BP: 119/61 122/63  Pulse: 101 98  Temp: 98.5 F (36.9 C)   TempSrc: Oral   Resp: 16 18  Height: 6' (1.829 m)   Weight: 63.504 kg (140 lb)   SpO2: 100% 98%    Wt Readings from Last 3 Encounters:  12/03/14 63.504 kg (140 lb)  11/04/14 72.576 kg (160 lb)  09/18/14 86.183 kg (190 lb)    General:  Appears calm and comfortable Eyes: PERRL, normal lids, irises & conjunctiva ENT: mucous membranes are dry Neck:  no LAD, masses or thyromegaly Cardiovascular: RRR, no m/r/g. No LE edema. Telemetry: SR, no arrhythmias  Respiratory: CTA bilaterally, no w/r/r. Normal respiratory effort. Abdomen: soft, nt nd Skin: no rash or induration seen on limited exam Musculoskeletal: grossly normal tone BUE/BLE Psychiatric: grossly normal mood and affect, speech fluent and appropriate Neurologic: grossly non-focal.          Labs on Admission:  Basic Metabolic Panel:  Recent Labs Lab 12/03/14 1138 12/03/14 1559  NA 134* 136  K 4.8 4.8  CL 97 104  CO2 19 11*  GLUCOSE 388* 406*  BUN 19 18  CREATININE 1.34 1.29  CALCIUM 8.7 8.4   Liver Function Tests:  Recent Labs Lab 12/03/14 1138  AST 22  ALT 22  ALKPHOS 178*  BILITOT 1.4*  PROT 7.4  ALBUMIN 3.3*   No results for input(s): LIPASE, AMYLASE in the last 168 hours. No results for input(s): AMMONIA in the last 168 hours. CBC:  Recent Labs Lab 12/03/14 1138  WBC 16.3*  NEUTROABS 13.3*  HGB 9.3*  HCT 28.6*  MCV 88.0  PLT 385   Cardiac Enzymes: No results for input(s): CKTOTAL, CKMB, CKMBINDEX, TROPONINI in the last 168 hours.  BNP (last 3 results) No results for input(s): PROBNP in the last 8760 hours. CBG:  Recent Labs Lab 12/03/14 1341 12/03/14 1549  GLUCAP 386* 367*    Radiological Exams on Admission: No results found.  Assessment/Plan Principal Problem:   DKA, type 1 Active Problems:   Hyperglycemia   Substance abuse   1. Diabetic ketoacidosis. Likely related to noncompliance with medications and diet. We'll start the patient on insulin infusion per DKA protocol. He'll be kept nothing by mouth for now. Will check serial chemistries. 2. Substance abuse. Will monitor for any signs of withdrawal. He will need evaluation by psychiatry to see if he isn't candidate for inpatient detox program. If he is not a candidate for inpatient, he'll be given resources for outpatient programs. 3. Anxiety. Continue Xanax  Code  Status: full code DVT Prophylaxis: Family Communication: discussed with patient and father at the bedside Disposition Plan: pending hospital course  Time spent: 60mins  [facility]MEMON,JEHANZEB [facility] Pager [phone]

## 2014-12-03 NOTE — ED Provider Notes (Signed)
CSN: [id number]     Arrival date & time 12/03/14  1101 History  This chart was scribed for Audree CamelScott T Debany Vantol, MD by Luisa DagoPriscilla Tutu, ED Scribe. This patient was seen in room APA17/APA17 and the patient's care was started at 1:07 PM.    Chief Complaint  Patient presents with  . Addiction Problem   The history is provided by the patient, medical records and a parent. No language interpreter was used.   HPI Comments: Tyler Mann is a 34 y.o. male with PMhx of DM, neuropathy, Narcotic addiction, and chronic back pain presents to the Emergency Department with father requesting help with detox from heroine. Pt was sent here to the ED from [facility]. He states that his last use was this morning, but he states that he does want help detoxing. Pt endorses taking his insulin daily. Denies any fever, chills, nausea, SI, HI, weakness, arthralgias, myalgias, SOB, chest pain, or abdominal pain.  Past Medical History  Diagnosis Date  . Diabetes mellitus   . Neuropathy   . Narcotic addiction     Hx IV heroin abuse  . Chronic back pain    Past Surgical History  Procedure Laterality Date  . Mouth surgery     History reviewed. No pertinent family history. History  Substance Use Topics  . Smoking status: Current Every Day Smoker -- 1.00 packs/day for 17 years    Types: Cigarettes  . Smokeless tobacco: Current User  . Alcohol Use: No    Review of Systems  Constitutional: Negative for fever and chills.  HENT: Negative for congestion.   Eyes: Negative for discharge.  Respiratory: Negative for cough and shortness of breath.   Cardiovascular: Negative for chest pain.  Gastrointestinal: Negative for abdominal pain and diarrhea.  Genitourinary: Negative for frequency and hematuria.  Musculoskeletal: Negative for back pain.  Skin: Negative for rash.  Neurological: Negative for seizures and headaches.  Psychiatric/Behavioral: Negative for suicidal ideas, hallucinations and self-injury.  All other  systems reviewed and are negative.     Allergies  Review of patient's allergies indicates no known allergies.  Home Medications   Prior to Admission medications   Medication Sig Start Date End Date Taking? Authorizing Provider  ALPRAZolam (XANAX) 0.25 MG tablet Take 0.25 mg by mouth 2 (two) times daily.    Historical Provider, MD  chlorhexidine (PERIDEX) 0.12 % solution 15 mLs by Mouth Rinse route 2 (two) times daily. 11/04/14   Henderson CloudEstela Y Hernandez Acosta, MD  cyclobenzaprine (FLEXERIL) 10 MG tablet Take 20 mg by mouth daily.    Historical Provider, MD  escitalopram (LEXAPRO) 10 MG tablet Take 10 mg by mouth daily.    Historical Provider, MD  insulin aspart (NOVOLOG) 100 UNIT/ML injection Please continue her sliding scale as you used to take at home Patient taking differently: Inject 5-35 Units into the skin 3 (three) times daily with meals. Please continue her sliding scale as you used to take at home 11/21/13   Rhetta MuraJai-Gurmukh Samtani, MD  insulin detemir (LEVEMIR) 100 UNIT/ML injection Inject 0.45 mLs (45 Units total) into the skin at bedtime. 11/04/14   Henderson CloudEstela Y Hernandez Acosta, MD  meloxicam (MOBIC) 15 MG tablet Take 15 mg by mouth daily.    Historical Provider, MD  Milnacipran (SAVELLA) 50 MG TABS tablet Take 50 mg by mouth 2 (two) times daily.    Historical Provider, MD  Vitamin D, Ergocalciferol, (DRISDOL) 50000 UNITS CAPS capsule Take 50,000 Units by mouth every 7 (seven) days. Usually takes on Sunday  Historical Provider, MD   BP 119/61 mmHg  Pulse 101  Temp(Src) 98.5 F (36.9 C) (Oral)  Resp 16  Ht 6' (1.829 m)  Wt 140 lb (63.504 kg)  BMI 18.98 kg/m2  SpO2 100%  Physical Exam  Constitutional: He is oriented to person, place, and time. He appears well-developed and well-nourished. No distress.  HENT:  Head: Normocephalic and atraumatic.  Right Ear: External ear normal.  Left Ear: External ear normal.  Nose: Nose normal.  Neck: Neck supple.  Cardiovascular: Normal rate,  regular rhythm and normal heart sounds.  Exam reveals no gallop and no friction rub.   No murmur heard. HR right around 100  Pulmonary/Chest: Effort normal and breath sounds normal. No respiratory distress.  Abdominal: Soft. He exhibits no distension. There is no tenderness.  Musculoskeletal: Normal range of motion. He exhibits no edema.  Neurological: He is oriented to person, place, and time. He exhibits normal muscle tone. Coordination normal.  Refuses to open eye during exam but pt is alert and oriented.   Skin: No rash noted. He is not diaphoretic. No erythema.  Psychiatric: He has a normal mood and affect. His behavior is normal.  Nursing note and vitals reviewed.   ED Course  Procedures (including critical care time)  DIAGNOSTIC STUDIES: Oxygen Saturation is 100% on RA, normal by my interpretation.    COORDINATION OF CARE: 1:13 PM- Pt advised of plan for treatment and pt agrees.  Results for orders placed or performed during the hospital encounter of 12/03/14  CBC with Differential  Result Value Ref Range   WBC 16.3 (H) 4.0 - 10.5 K/uL   RBC 3.25 (L) 4.22 - 5.81 MIL/uL   Hemoglobin 9.3 (L) 13.0 - 17.0 g/dL   HCT 78.2 (L) 95.6 - 21.3 %   MCV 88.0 78.0 - 100.0 fL   MCH 28.6 26.0 - 34.0 pg   MCHC 32.5 30.0 - 36.0 g/dL   RDW 08.6 (H) 57.8 - 46.9 %   Platelets 385 150 - 400 K/uL   Neutrophils Relative % 82 (H) 43 - 77 %   Neutro Abs 13.3 (H) 1.7 - 7.7 K/uL   Lymphocytes Relative 10 (L) 12 - 46 %   Lymphs Abs 1.6 0.7 - 4.0 K/uL   Monocytes Relative 7 3 - 12 %   Monocytes Absolute 1.2 (H) 0.1 - 1.0 K/uL   Eosinophils Relative 1 0 - 5 %   Eosinophils Absolute 0.1 0.0 - 0.7 K/uL   Basophils Relative 0 0 - 1 %   Basophils Absolute 0.0 0.0 - 0.1 K/uL  Comprehensive metabolic panel  Result Value Ref Range   Sodium 134 (L) 135 - 145 mmol/L   Potassium 4.8 3.5 - 5.1 mmol/L   Chloride 97 96 - 112 mEq/L   CO2 19 19 - 32 mmol/L   Glucose, Bld 388 (H) 70 - 99 mg/dL   BUN 19 6  - 23 mg/dL   Creatinine, Ser 6.29 0.50 - 1.35 mg/dL   Calcium 8.7 8.4 - 52.8 mg/dL   Total Protein 7.4 6.0 - 8.3 g/dL   Albumin 3.3 (L) 3.5 - 5.2 g/dL   AST 22 0 - 37 U/L   ALT 22 0 - 53 U/L   Alkaline Phosphatase 178 (H) 39 - 117 U/L   Total Bilirubin 1.4 (H) 0.3 - 1.2 mg/dL   GFR calc non Af Amer 67 (L) >90 mL/min   GFR calc Af Amer 78 (L) >90 mL/min   Anion gap 18 (  H) 5 - 15  Drug screen panel, emergency  Result Value Ref Range   Opiates NONE DETECTED NONE DETECTED   Cocaine NONE DETECTED NONE DETECTED   Benzodiazepines NONE DETECTED NONE DETECTED   Amphetamines POSITIVE (A) NONE DETECTED   Tetrahydrocannabinol NONE DETECTED NONE DETECTED   Barbiturates NONE DETECTED NONE DETECTED  Urinalysis, Routine w reflex microscopic  Result Value Ref Range   Color, Urine YELLOW YELLOW   APPearance CLEAR CLEAR   Specific Gravity, Urine 1.015 1.005 - 1.030   pH 5.5 5.0 - 8.0   Glucose, UA >1000 (A) NEGATIVE mg/dL   Hgb urine dipstick NEGATIVE NEGATIVE   Bilirubin Urine NEGATIVE NEGATIVE   Ketones, ur >80 (A) NEGATIVE mg/dL   Protein, ur NEGATIVE NEGATIVE mg/dL   Urobilinogen, UA 0.2 0.0 - 1.0 mg/dL   Nitrite NEGATIVE NEGATIVE   Leukocytes, UA NEGATIVE NEGATIVE  Blood gas, venous  Result Value Ref Range   FIO2 21.00 %   pH, Ven 7.373 (H) 7.250 - 7.300   pCO2, Ven 26.3 (L) 45.0 - 50.0 mmHg   pO2, Ven 100.0 (H) 30.0 - 45.0 mmHg   Bicarbonate 14.9 (L) 20.0 - 24.0 mEq/L   TCO2 14.00 0 - 100 mmol/L   Acid-base deficit 9.3 (H) 0.0 - 2.0 mmol/L   O2 Saturation 97.0 %   Patient temperature 37.0    Collection site LEFT AC    Drawn by BY DAPHNE ANDERSON,RN    Sample type VEIN   Urine microscopic-add on  Result Value Ref Range   WBC, UA 0-2 <3 WBC/hpf  POC CBG, ED  Result Value Ref Range   Glucose-Capillary 386 (H) 70 - 99 mg/dL   No results found.    MDM   Final diagnoses:  Hyperglycemia    Patient with hyperglycemia, near DKA. Has mild acidosis with anion gap and ketones  in urine. Compensated pH at this time, but he also has evidence of AKI with Cr of 1.34 (normal ~ 0.7). Does not seem like a reliable patient to discharge given findings. Will hydrate. D/w Dr. Kerry Hough, recommends 45 units levemir and hydration and will do observation admission.  I personally performed the services described in this documentation, which was scribed in my presence. The recorded information has been reviewed and is accurate.    Audree Camel, MD 12/03/14 [phone]

## 2014-12-03 NOTE — Plan of Care (Signed)
Problem: Consults Goal: Diabetic Ketoacidosis (DKA) Patient Education See Patient Education Modules for education specifics. Outcome: Progressing Admitted to SD in DKA with an anion gap of 21, next bmet dropped to 13, next bmet at [place], insulin gtt continues Goal: Nutrition Consult-if indicated Outcome: Not Progressing This patient is becoming malnourished each time he is admitted.  He has been living on his own since December 17th 2015. That is new.  Problem: Phase I Progression Outcomes Goal: Initial discharge plan identified Outcome: Progressing To home Goal: Pt. states reason for hospitalization Outcome: Completed/Met Date Met:  12/03/14 Feeling badly, admitted to his substance abuse and requesting assistance to get off drugs.

## 2014-12-04 ENCOUNTER — Inpatient Hospital Stay (HOSPITAL_COMMUNITY): Payer: BLUE CROSS/BLUE SHIELD

## 2014-12-04 DIAGNOSIS — D72829 Elevated white blood cell count, unspecified: Secondary | ICD-10-CM

## 2014-12-04 DIAGNOSIS — E860 Dehydration: Secondary | ICD-10-CM

## 2014-12-04 LAB — GLUCOSE, CAPILLARY
GLUCOSE-CAPILLARY: 201 mg/dL — AB (ref 70–99)
GLUCOSE-CAPILLARY: 145 mg/dL — AB (ref 70–99)
GLUCOSE-CAPILLARY: 41 mg/dL — AB (ref 70–99)
GLUCOSE-CAPILLARY: 219 mg/dL — AB (ref 70–99)
GLUCOSE-CAPILLARY: 358 mg/dL — AB (ref 70–99)
Glucose-Capillary: 135 mg/dL — ABNORMAL HIGH (ref 70–99)
Glucose-Capillary: 141 mg/dL — ABNORMAL HIGH (ref 70–99)
Glucose-Capillary: 191 mg/dL — ABNORMAL HIGH (ref 70–99)
Glucose-Capillary: 241 mg/dL — ABNORMAL HIGH (ref 70–99)

## 2014-12-04 LAB — CBC
HCT: 29.5 % — ABNORMAL LOW (ref 39.0–52.0)
Hemoglobin: 9.5 g/dL — ABNORMAL LOW (ref 13.0–17.0)
MCH: 28.1 pg (ref 26.0–34.0)
MCHC: 32.2 g/dL (ref 30.0–36.0)
MCV: 87.3 fL (ref 78.0–100.0)
PLATELETS: 404 10*3/uL — AB (ref 150–400)
RBC: 3.38 MIL/uL — ABNORMAL LOW (ref 4.22–5.81)
RDW: 16.9 % — ABNORMAL HIGH (ref 11.5–15.5)
WBC: 15.2 10*3/uL — ABNORMAL HIGH (ref 4.0–10.5)

## 2014-12-04 LAB — BASIC METABOLIC PANEL
Anion gap: 8 (ref 5–15)
Anion gap: 7 (ref 5–15)
BUN: 13 mg/dL (ref 6–23)
BUN: 10 mg/dL (ref 6–23)
CALCIUM: 8.1 mg/dL — AB (ref 8.4–10.5)
CHLORIDE: 107 meq/L (ref 96–112)
CO2: 24 mmol/L (ref 19–32)
CO2: 21 mmol/L (ref 19–32)
Calcium: 8.4 mg/dL (ref 8.4–10.5)
Chloride: 103 mEq/L (ref 96–112)
Creatinine, Ser: 0.88 mg/dL (ref 0.50–1.35)
Creatinine, Ser: 0.76 mg/dL (ref 0.50–1.35)
GFR calc Af Amer: >90 mL/min (ref >90)
GFR calc Af Amer: >90 mL/min (ref >90)
GFR calc non Af Amer: >90 mL/min (ref >90)
GFR calc non Af Amer: >90 mL/min (ref >90)
GLUCOSE: 106 mg/dL — AB (ref 70–99)
Glucose, Bld: 166 mg/dL — ABNORMAL HIGH (ref 70–99)
Potassium: 4.1 mmol/L (ref 3.5–5.1)
Potassium: 3.7 mmol/L (ref 3.5–5.1)
SODIUM: 138 mmol/L (ref 135–145)
Sodium: 132 mmol/L — ABNORMAL LOW (ref 135–145)

## 2014-12-04 MED ORDER — INSULIN DETEMIR 100 UNIT/ML ~~LOC~~ SOLN
30.0000 [IU] | Freq: Every day | SUBCUTANEOUS | Status: DC
  Administered 2014-12-04: 30 [IU] via SUBCUTANEOUS
  Filled 2014-12-04 (×3): qty 0.3

## 2014-12-04 MED ORDER — INSULIN ASPART 100 UNIT/ML ~~LOC~~ SOLN
0.0000 [IU] | Freq: Three times a day (TID) | SUBCUTANEOUS | Status: DC
  Administered 2014-12-04 – 2014-12-05 (×3): 3 [IU] via SUBCUTANEOUS
  Administered 2014-12-06: 2 [IU] via SUBCUTANEOUS
  Administered 2014-12-06: 3 [IU] via SUBCUTANEOUS
  Administered 2014-12-06: 5 [IU] via SUBCUTANEOUS
  Administered 2014-12-07: 3 [IU] via SUBCUTANEOUS
  Administered 2014-12-07: 1 [IU] via SUBCUTANEOUS

## 2014-12-04 MED ORDER — ALPRAZOLAM 0.5 MG PO TABS
0.5000 mg | ORAL_TABLET | Freq: Two times a day (BID) | ORAL | Status: DC | PRN
  Administered 2014-12-04 – 2014-12-07 (×6): 0.5 mg via ORAL
  Filled 2014-12-04 (×6): qty 1

## 2014-12-04 MED ORDER — INSULIN DETEMIR 100 UNIT/ML ~~LOC~~ SOLN
45.0000 [IU] | Freq: Every day | SUBCUTANEOUS | Status: DC
  Administered 2014-12-04 – 2014-12-05 (×2): 45 [IU] via SUBCUTANEOUS
  Filled 2014-12-04 (×2): qty 0.45

## 2014-12-04 MED ORDER — INSULIN ASPART 100 UNIT/ML ~~LOC~~ SOLN
0.0000 [IU] | Freq: Every day | SUBCUTANEOUS | Status: DC
  Administered 2014-12-04: 5 [IU] via SUBCUTANEOUS

## 2014-12-04 MED ORDER — ENOXAPARIN SODIUM 40 MG/0.4ML ~~LOC~~ SOLN
40.0000 mg | SUBCUTANEOUS | Status: DC
  Filled 2014-12-04 (×3): qty 0.4

## 2014-12-04 MED ORDER — SODIUM CHLORIDE 0.9 % IV SOLN
INTRAVENOUS | Status: DC
  Administered 2014-12-04 – 2014-12-07 (×6): via INTRAVENOUS

## 2014-12-04 MED ORDER — GUAIFENESIN ER 600 MG PO TB12
600.0000 mg | ORAL_TABLET | Freq: Two times a day (BID) | ORAL | Status: DC | PRN
  Administered 2014-12-05: 600 mg via ORAL
  Filled 2014-12-04 (×2): qty 1

## 2014-12-04 NOTE — Progress Notes (Signed)
Called for transition orders off insulin gtt.  Anion gap closed at 8, and co2 now 21.  Orders for transition given and MD asked that I feed him.  Carb coverage completed after meal, levimir 30 units SQ given and gtt to remain on for 1-2 hrs after administration. Blood sugar after meal was 201.  Will recheck at 0330 and discontinue gtt at that time.

## 2014-12-04 NOTE — Progress Notes (Addendum)
[facility] PROGRESS NOTE  Paulo FruitJohn E Fiero ZOX:[id number] DOB: May 13, 1981 DOA: 12/03/2014 PCP: Catalina PizzaHALL, ZACH, MD  Assessment/Plan: 1. Diabetic ketoacidosis. Appears resolved with IV fluids and insulin infusion. He's been transitioned to subcutaneous insulin. We'll continue to monitor blood sugars. 2. Dehydration. Continue with IV fluids. 3. Leukocytosis. He does not have any signs of infection. Urinalysis is unremarkable. We'll check chest x-ray. May be reactive to dehydration. Continue to monitor. Patient does not have any fever and does not appear septic or toxic. 4. Substance abuse. We'll request psychiatry consult to see if he is a candidate for inpatient detox program. If he is not a candidate for inpatient, he will need resources for outpatient programs. 5. Anxiety. Continue Xanax when necessary.  Code Status: full code Family Communication: discussed with patient and parents Disposition Plan: pending psychiatry input   Consultants:  psychiatry  Procedures:    Antibiotics:    HPI/Subjective: Patient is feeling hungry. Has a bit of cough, nonproductive. Feels tired.  Objective: Filed Vitals:   12/04/14 1000  BP: 130/83  Pulse: 95  Temp:   Resp: 28    Intake/Output Summary (Last 24 hours) at 12/04/14 1030 Last data filed at 12/04/14 0209  Gross per 24 hour  Intake 1059.67 ml  Output   1775 ml  Net -715.33 ml   Filed Weights   12/03/14 1106 12/03/14 2017  Weight: 63.504 kg (140 lb) 64.9 kg (143 lb 1.3 oz)    Exam:   General:  NAD  Cardiovascular: S1, S2 RRR  Respiratory: CTA B  Abdomen: soft, nt, nd, bs+  Musculoskeletal: no edema b/l   Data Reviewed: Basic Metabolic Panel:  Recent Labs Lab 12/03/14 1138 12/03/14 1559 12/03/14 2021 12/04/14 0037 12/04/14 0439  NA 134* 136 134* 132* 138  K 4.8 4.8 4.5 4.1 3.7  CL 97 104 103 103 107  CO2 19 11* 18* 21 24  GLUCOSE 388* 406* 299* 166* 106*  BUN 19 18 18 13 10   CREATININE 1.34 1.29  1.24 0.88 0.76  CALCIUM 8.7 8.4 8.4 8.1* 8.4   Liver Function Tests:  Recent Labs Lab 12/03/14 1138  AST 22  ALT 22  ALKPHOS 178*  BILITOT 1.4*  PROT 7.4  ALBUMIN 3.3*   No results for input(s): LIPASE, AMYLASE in the last 168 hours. No results for input(s): AMMONIA in the last 168 hours. CBC:  Recent Labs Lab 12/03/14 1138 12/04/14 0439  WBC 16.3* 15.2*  NEUTROABS 13.3*  --   HGB 9.3* 9.5*  HCT 28.6* 29.5*  MCV 88.0 87.3  PLT 385 404*   Cardiac Enzymes: No results for input(s): CKTOTAL, CKMB, CKMBINDEX, TROPONINI in the last 168 hours. BNP (last 3 results) No results for input(s): PROBNP in the last 8760 hours. CBG:  Recent Labs Lab 12/04/14 0123 12/04/14 0225 12/04/14 0321 12/04/14 0741 12/04/14 0827  GLUCAP 191* 201* 145* 41* 135*    No results found for this or any previous visit (from the past 240 hour(s)).   Studies: No results found.  Scheduled Meds: . chlorhexidine  15 mL Mouth Rinse BID  . enoxaparin (LOVENOX) injection  40 mg Subcutaneous Q24H  . Influenza vac split quadrivalent PF  0.5 mL Intramuscular Tomorrow-1000  . insulin aspart  0-5 Units Subcutaneous QHS  . insulin aspart  0-9 Units Subcutaneous TID WC  . insulin detemir  30 Units Subcutaneous QHS  . meloxicam  15 mg Oral Daily  . Milnacipran  50 mg Oral BID  . pneumococcal 23 valent vaccine  0.5 mL Intramuscular Tomorrow-1000   Continuous Infusions: . sodium chloride      Principal Problem:   DKA, type 1 Active Problems:   Hyperglycemia   Substance abuse    Time spent:    [facility]  [facility] Pager [phone]. If 7PM-7AM, please contact night-coverage at [email or web address], password [facility] 12/04/2014, 10:30 AM  LOS: 1 day    Addendum:  Discussed case with the behavioral health. It was recommended that since the patient is detoxing from heroin and methamphetamines, he does not require specific inpatient detox program. He'll be appropriate for him to  pursue an outpatient program. Behavioral Health will fax over a list of resources for the patient to pursue after discharge. We'll continue with fluids overnight. Monitor blood sugars and possible discharge tomorrow. Patient will be transferred to a MedSurg bed. I called the patient to update him on the plan of care, but he adamantly refused to speak to me today saying that it could wait until tomorrow and does not wish to be bothered at this time.  MEMON,JEHANZEB

## 2014-12-04 NOTE — BH Assessment (Signed)
[facility] Assessment Progress Note  Spoke with Dr. Rennis ChrisMemmon, took history of pt, and explained policy of no TTS consult for opiate detox with no other psych issues. Dr, Rennis ChrisMemmon agreed and writer faxed information for facilities so pt can call. [facility] will contact TTS if further assistance is needed.

## 2014-12-04 NOTE — Progress Notes (Addendum)
Spoke to pt's family about obtaining pt's home medication Savella (milnacipran). The family states that they are unable to locate the bottle at home, and that his doctor wants to take him off the medication and put him on a different one. Pharmacy is aware that pt is unable to provide home medication and that it is a medication that is not recommended to be stopped without a taper. They are looking into finding a way to provide the medication while pt is in the hospital. Will continue to monitor.

## 2014-12-04 NOTE — Progress Notes (Signed)
Called by RN to discontinue the sitter at bedside as patient did not have suicidal thoughts or ideation at the time of admission, went and talked to the patient. He denies suicidal thoughts or ideation. Will discontinue the sitter at bedside.

## 2014-12-04 NOTE — Progress Notes (Signed)
Nutrition Brief Note  Patient identified on the Malnutrition Screening Tool (MST) Report  Pt has hx of Type I diabetes. Presents to ED request for detox due to substance abuse (heroin and methamphetamine).  Body mass index is 19.4 kg/(m^2). Patient meets criteria for normal range based on current BMI.   Current diet order is CHO modified. Labs and medications reviewed.   No nutrition interventions warranted at this time. If nutrition issues arise, please consult RD.   Royann ShiversLynn Sharon Stapel MS,RD,CSG,LDN Office: [phone] Pager: [phone]

## 2014-12-05 DIAGNOSIS — F1910 Other psychoactive substance abuse, uncomplicated: Secondary | ICD-10-CM

## 2014-12-05 DIAGNOSIS — F1994 Other psychoactive substance use, unspecified with psychoactive substance-induced mood disorder: Secondary | ICD-10-CM

## 2014-12-05 DIAGNOSIS — E1010 Type 1 diabetes mellitus with ketoacidosis without coma: Principal | ICD-10-CM

## 2014-12-05 DIAGNOSIS — R45851 Suicidal ideations: Secondary | ICD-10-CM

## 2014-12-05 DIAGNOSIS — R739 Hyperglycemia, unspecified: Secondary | ICD-10-CM

## 2014-12-05 LAB — GLUCOSE, CAPILLARY
GLUCOSE-CAPILLARY: 51 mg/dL — AB (ref 70–99)
GLUCOSE-CAPILLARY: 308 mg/dL — AB (ref 70–99)
Glucose-Capillary: 70 mg/dL (ref 70–99)
Glucose-Capillary: 93 mg/dL (ref 70–99)
Glucose-Capillary: 209 mg/dL — ABNORMAL HIGH (ref 70–99)
Glucose-Capillary: 564 mg/dL (ref 70–99)
Glucose-Capillary: 243 mg/dL — ABNORMAL HIGH (ref 70–99)

## 2014-12-05 LAB — CBC
HCT: 33.2 % — ABNORMAL LOW (ref 39.0–52.0)
Hemoglobin: 10.4 g/dL — ABNORMAL LOW (ref 13.0–17.0)
MCH: 27.8 pg (ref 26.0–34.0)
MCHC: 31.3 g/dL (ref 30.0–36.0)
MCV: 88.8 fL (ref 78.0–100.0)
Platelets: 478 10*3/uL — ABNORMAL HIGH (ref 150–400)
RBC: 3.74 MIL/uL — ABNORMAL LOW (ref 4.22–5.81)
RDW: 16.9 % — ABNORMAL HIGH (ref 11.5–15.5)
WBC: 9.1 10*3/uL (ref 4.0–10.5)

## 2014-12-05 LAB — BASIC METABOLIC PANEL WITH GFR
Anion gap: 5 (ref 5–15)
BUN: 12 mg/dL (ref 6–23)
CO2: 27 mmol/L (ref 19–32)
Calcium: 8.3 mg/dL — ABNORMAL LOW (ref 8.4–10.5)
Chloride: 108 meq/L (ref 96–112)
Creatinine, Ser: 0.57 mg/dL (ref 0.50–1.35)
GFR calc Af Amer: >90 mL/min
GFR calc non Af Amer: >90 mL/min
Glucose, Bld: 63 mg/dL — ABNORMAL LOW (ref 70–99)
Potassium: 3.9 mmol/L (ref 3.5–5.1)
Sodium: 140 mmol/L (ref 135–145)

## 2014-12-05 MED ORDER — INSULIN ASPART 100 UNIT/ML ~~LOC~~ SOLN
20.0000 [IU] | Freq: Once | SUBCUTANEOUS | Status: AC
  Administered 2014-12-05: 20 [IU] via SUBCUTANEOUS

## 2014-12-05 NOTE — Progress Notes (Signed)
Was informed by patient's parents that during their visit today, that patient stated that he was going to kill himself once he leaves the hospital. They are concerned about his safety. Will request psychiatry consult to assess further disposition  MEMON,JEHANZEB

## 2014-12-05 NOTE — Progress Notes (Signed)
1706 Pt's CBG 564, MD notified & order given to give Novolog Insulin 20 units SQ x 1 time only & recheck CBG @ 1830.

## 2014-12-05 NOTE — Progress Notes (Signed)
Hypoglycemic Event  CBG: 51  Treatment: 15 GM carbohydrate snack  Symptoms: None  Follow-up CBG: Time:0827 CBG Result:93  Possible Reasons for Event: Inadequate meal intake  Comments/MD notified: MD notified. OJ & food given.    Tyler Mann L  Remember to initiate Hypoglycemia Order Set & complete

## 2014-12-05 NOTE — Consult Note (Signed)
Telepsych Consultation   Reason for Consult:  SI threats  Referring Physician:  Dr. Shela Leff is an 34 y.o. male.  Assessment: AXIS I:  Substance Abuse and Substance Induced Mood Disorder AXIS II:  Deferred AXIS III:   Past Medical History  Diagnosis Date  . Diabetes mellitus   . Neuropathy   . Narcotic addiction     Hx IV heroin abuse  . Chronic back pain    AXIS IV:  economic problems, housing problems, occupational problems, problems related to social environment and problems with primary support group AXIS V:  41-50 serious symptoms  Plan:  Recommend psychiatric Inpatient admission when medically cleared.  Subjective:   Tyler Mann is a 34 y.o. male patient who presented to the [facility] for evaluation of polysubstance abuse and detoxification.  Patient was evaluated in the Emergency department and was found to be in mild DKA related to poor diabetes compliance in the context of heroin and methamphetamine abuse. Patient made a threat to kill himself earlier in the day when talking with his parents. And a request for evaluation was submitted.   HPI:  Tyler Mann is a 34 year old Caucasian male patient who appears his stated age is being evaluated for specific threats to harm self.  Patient endorses multiple social stressors at this time.  He relates on assessment that he has "lost his home, his job and almost his life"  Patient became angry with parents during a family meeting regarding discharge arrangements in which patient was to be returning to his parents home for financial stabilization and to assist patient in maintaining recovery.  Parents verbalized concerns regarding patients drug use and have instituted house rules stating that parents will not allow him to stay in their home if he continues to use drugs.  Patient admits that he made a suicidal threat at that meeting and then apologized to parents who have returned to the  to discuss discharge plans.  Patient  minimizes his IV drug use and denies needs for substance recovery services at this time.  Patient reports that he uses 1 bag of Iv  heroin per day and shoots a ball of meth a few times per week.  On examination patient is quite guarded and is easily irritated regarding assessment questions to ascertain patient's intent to harm self / as well as to obtain psychiatric history and mental health needs.   During assessment interview patient becomes belligerent and then yells at this interviewer stating "I may need to prove a point that I am sick and tired of my parent's bullshit and if it continues they are going to be burying their son.  Patient then refused to further this interview and began swearing and yelling at this evaluator.  Teleassessment was terminated at this time as continued in order to allow patient to regroup and calm down. Patient does not appear to be attending to internal stimuli although would not answer questions regarding HI, AVH.  Due to patient's easy irritability and repeated threats to self harm if he has to follow parental rules when returning to parents home, I feel that patient is a danger to himself and others.  HPI Elements:   Location:  generalized. Quality:  acute. Severity:  severe. Timing:  ongoing. Duration:  past few weeks. Context:  substance abuse, job loss, psycho social stressors .  Past Psychiatric History: Past Medical History  Diagnosis Date  . Diabetes mellitus   . Neuropathy   .  Narcotic addiction     Hx IV heroin abuse  . Chronic back pain     reports that he has been smoking Cigarettes.  He has a 17 pack-year smoking history. He uses smokeless tobacco. He reports that he uses illicit drugs (Methamphetamines). He reports that he does not drink alcohol. History reviewed. No pertinent family history.   Living Arrangements: Alone   Allergies:  No Known Allergies  ACT Assessment Complete: no telessessment completed   Educational Status    Risk to Self:  Risk to self with the past 6 months Is patient at risk for suicide?: Yes Substance abuse history and/or treatment for substance abuse?: Yes  Risk to Others:    Abuse: Abuse/Neglect Assessment (Assessment to be complete while patient is alone) Physical Abuse: Denies Verbal Abuse: Denies Sexual Abuse: Denies Exploitation of patient/patient's resources: Denies Self-Neglect: Denies, provider concerned (Comment) (recurrent admissions in DKA,)  Prior Inpatient Therapy:    Prior Outpatient Therapy:    Additional Information:                    Objective: Blood pressure 143/80, pulse 107, temperature 97.9 F (36.6 C), temperature source Oral, resp. rate 20, height 6' (1.829 m), weight 69.1 kg (152 lb 5.4 oz), SpO2 99 %.Body mass index is 20.66 kg/(m^2). Results for orders placed or performed during the hospital encounter of 12/03/14 (from the past 72 hour(s))  CBC with Differential     Status: Abnormal   Collection Time: 12/03/14 11:38 AM  Result Value Ref Range   WBC 16.3 (H) 4.0 - 10.5 K/uL   RBC 3.25 (L) 4.22 - 5.81 MIL/uL   Hemoglobin 9.3 (L) 13.0 - 17.0 g/dL   HCT 28.6 (L) 39.0 - 52.0 %   MCV 88.0 78.0 - 100.0 fL   MCH 28.6 26.0 - 34.0 pg   MCHC 32.5 30.0 - 36.0 g/dL   RDW 17.0 (H) 11.5 - 15.5 %   Platelets 385 150 - 400 K/uL   Neutrophils Relative % 82 (H) 43 - 77 %   Neutro Abs 13.3 (H) 1.7 - 7.7 K/uL   Lymphocytes Relative 10 (L) 12 - 46 %   Lymphs Abs 1.6 0.7 - 4.0 K/uL   Monocytes Relative 7 3 - 12 %   Monocytes Absolute 1.2 (H) 0.1 - 1.0 K/uL   Eosinophils Relative 1 0 - 5 %   Eosinophils Absolute 0.1 0.0 - 0.7 K/uL   Basophils Relative 0 0 - 1 %   Basophils Absolute 0.0 0.0 - 0.1 K/uL  Comprehensive metabolic panel     Status: Abnormal   Collection Time: 12/03/14 11:38 AM  Result Value Ref Range   Sodium 134 (L) 135 - 145 mmol/L    Comment: Please note change in reference range.   Potassium 4.8 3.5 - 5.1 mmol/L    Comment: Please note change in  reference range.   Chloride 97 96 - 112 mEq/L   CO2 19 19 - 32 mmol/L   Glucose, Bld 388 (H) 70 - 99 mg/dL   BUN 19 6 - 23 mg/dL   Creatinine, Ser 1.34 0.50 - 1.35 mg/dL   Calcium 8.7 8.4 - 10.5 mg/dL   Total Protein 7.4 6.0 - 8.3 g/dL   Albumin 3.3 (L) 3.5 - 5.2 g/dL   AST 22 0 - 37 U/L   ALT 22 0 - 53 U/L   Alkaline Phosphatase 178 (H) 39 - 117 U/L   Total Bilirubin 1.4 (H) 0.3 - 1.2  mg/dL   GFR calc non Af Amer 67 (L) >90 mL/min   GFR calc Af Amer 78 (L) >90 mL/min    Comment: (NOTE) The eGFR has been calculated using the CKD EPI equation. This calculation has not been validated in all clinical situations. eGFR's persistently <90 mL/min signify possible Chronic Kidney Disease.    Anion gap 18 (H) 5 - 15  Drug screen panel, emergency     Status: Abnormal   Collection Time: 12/03/14  1:15 PM  Result Value Ref Range   Opiates NONE DETECTED NONE DETECTED   Cocaine NONE DETECTED NONE DETECTED   Benzodiazepines NONE DETECTED NONE DETECTED   Amphetamines POSITIVE (A) NONE DETECTED   Tetrahydrocannabinol NONE DETECTED NONE DETECTED   Barbiturates NONE DETECTED NONE DETECTED    Comment:        DRUG SCREEN FOR MEDICAL PURPOSES ONLY.  IF CONFIRMATION IS NEEDED FOR ANY PURPOSE, NOTIFY LAB WITHIN 5 DAYS.        LOWEST DETECTABLE LIMITS FOR URINE DRUG SCREEN Drug Class       Cutoff (ng/mL) Amphetamine      1000 Barbiturate      200 Benzodiazepine   810 Tricyclics       175 Opiates          300 Cocaine          300 THC              50   Urinalysis, Routine w reflex microscopic     Status: Abnormal   Collection Time: 12/03/14  1:15 PM  Result Value Ref Range   Color, Urine YELLOW YELLOW   APPearance CLEAR CLEAR   Specific Gravity, Urine 1.015 1.005 - 1.030   pH 5.5 5.0 - 8.0   Glucose, UA >1000 (A) NEGATIVE mg/dL   Hgb urine dipstick NEGATIVE NEGATIVE   Bilirubin Urine NEGATIVE NEGATIVE   Ketones, ur >80 (A) NEGATIVE mg/dL   Protein, ur NEGATIVE NEGATIVE mg/dL    Urobilinogen, UA 0.2 0.0 - 1.0 mg/dL   Nitrite NEGATIVE NEGATIVE   Leukocytes, UA NEGATIVE NEGATIVE  Urine microscopic-add on     Status: None   Collection Time: 12/03/14  1:15 PM  Result Value Ref Range   WBC, UA 0-2 <3 WBC/hpf  Blood gas, venous     Status: Abnormal   Collection Time: 12/03/14  1:26 PM  Result Value Ref Range   FIO2 21.00 %   pH, Ven 7.373 (H) 7.250 - 7.300    Comment: CRITICAL RESULT CALLED TO, READ BACK BY AND VERIFIED WITH: DAPHNE ANDERSON,RN AT 1336 BY VANESSA LAWSON,RRT,RCP ON 12/03/2014    pCO2, Ven 26.3 (L) 45.0 - 50.0 mmHg    Comment: CRITICAL RESULT CALLED TO, READ BACK BY AND VERIFIED WITH: DAPHNE ANDERSON,RN AT 1336 BY VANESSA LAWSON, RRT,RCP ON 12/03/2014    pO2, Ven 100.0 (H) 30.0 - 45.0 mmHg    Comment: CRITICAL RESULT CALLED TO, READ BACK BY AND VERIFIED WITH: DAPHNE ANDERSON,RN AT 1336 BY VANESSA LAWSON,RRT,RCP ON 12/03/2014    Bicarbonate 14.9 (L) 20.0 - 24.0 mEq/L   TCO2 14.00 0 - 100 mmol/L   Acid-base deficit 9.3 (H) 0.0 - 2.0 mmol/L   O2 Saturation 97.0 %   Patient temperature 37.0    Collection site LEFT AC    Drawn by BY DAPHNE ANDERSON,RN    Sample type VEIN   POC CBG, ED     Status: Abnormal   Collection Time: 12/03/14  1:41 PM  Result Value  Ref Range   Glucose-Capillary 386 (H) 70 - 99 mg/dL  CBG monitoring, ED     Status: Abnormal   Collection Time: 12/03/14  3:49 PM  Result Value Ref Range   Glucose-Capillary 367 (H) 70 - 99 mg/dL  Basic metabolic panel     Status: Abnormal   Collection Time: 12/03/14  3:59 PM  Result Value Ref Range   Sodium 136 135 - 145 mmol/L    Comment: Please note change in reference range.   Potassium 4.8 3.5 - 5.1 mmol/L    Comment: Please note change in reference range.   Chloride 104 96 - 112 mEq/L   CO2 11 (L) 19 - 32 mmol/L   Glucose, Bld 406 (H) 70 - 99 mg/dL   BUN 18 6 - 23 mg/dL   Creatinine, Ser 1.29 0.50 - 1.35 mg/dL   Calcium 8.4 8.4 - 10.5 mg/dL   GFR calc non Af Amer 71 (L) >90 mL/min    GFR calc Af Amer 82 (L) >90 mL/min    Comment: (NOTE) The eGFR has been calculated using the CKD EPI equation. This calculation has not been validated in all clinical situations. eGFR's persistently <90 mL/min signify possible Chronic Kidney Disease.    Anion gap 21 (H) 5 - 15  CBG monitoring, ED     Status: Abnormal   Collection Time: 12/03/14  6:02 PM  Result Value Ref Range   Glucose-Capillary 369 (H) 70 - 99 mg/dL  CBG monitoring, ED     Status: Abnormal   Collection Time: 12/03/14  7:10 PM  Result Value Ref Range   Glucose-Capillary 278 (H) 70 - 99 mg/dL  Glucose, capillary     Status: Abnormal   Collection Time: 12/03/14  8:00 PM  Result Value Ref Range   Glucose-Capillary 286 (H) 70 - 99 mg/dL  Basic metabolic panel (stat then every 4 hours)     Status: Abnormal   Collection Time: 12/03/14  8:21 PM  Result Value Ref Range   Sodium 134 (L) 135 - 145 mmol/L    Comment: Please note change in reference range.   Potassium 4.5 3.5 - 5.1 mmol/L    Comment: Please note change in reference range.   Chloride 103 96 - 112 mEq/L   CO2 18 (L) 19 - 32 mmol/L   Glucose, Bld 299 (H) 70 - 99 mg/dL   BUN 18 6 - 23 mg/dL   Creatinine, Ser 1.24 0.50 - 1.35 mg/dL   Calcium 8.4 8.4 - 10.5 mg/dL   GFR calc non Af Amer 74 (L) >90 mL/min   GFR calc Af Amer 86 (L) >90 mL/min    Comment: (NOTE) The eGFR has been calculated using the CKD EPI equation. This calculation has not been validated in all clinical situations. eGFR's persistently <90 mL/min signify possible Chronic Kidney Disease.    Anion gap 13 5 - 15  Glucose, capillary     Status: Abnormal   Collection Time: 12/03/14  9:15 PM  Result Value Ref Range   Glucose-Capillary 241 (H) 70 - 99 mg/dL   Comment 1 Documented in Chart    Comment 2 Notify RN   Glucose, capillary     Status: Abnormal   Collection Time: 12/03/14 10:34 PM  Result Value Ref Range   Glucose-Capillary 175 (H) 70 - 99 mg/dL   Comment 1 Documented in Chart     Comment 2 Notify RN   Glucose, capillary     Status: Abnormal   Collection  Time: 12/03/14 11:28 PM  Result Value Ref Range   Glucose-Capillary 146 (H) 70 - 99 mg/dL   Comment 1 Notify RN    Comment 2 Documented in Chart   Glucose, capillary     Status: Abnormal   Collection Time: 12/04/14 12:23 AM  Result Value Ref Range   Glucose-Capillary 141 (H) 70 - 99 mg/dL   Comment 1 Notify RN    Comment 2 Documented in Chart   Basic metabolic panel (stat then every 4 hours)     Status: Abnormal   Collection Time: 12/04/14 12:37 AM  Result Value Ref Range   Sodium 132 (L) 135 - 145 mmol/L    Comment: Please note change in reference range.   Potassium 4.1 3.5 - 5.1 mmol/L    Comment: Please note change in reference range.   Chloride 103 96 - 112 mEq/L   CO2 21 19 - 32 mmol/L   Glucose, Bld 166 (H) 70 - 99 mg/dL   BUN 13 6 - 23 mg/dL   Creatinine, Ser 0.88 0.50 - 1.35 mg/dL   Calcium 8.1 (L) 8.4 - 10.5 mg/dL   GFR calc non Af Amer >90 >90 mL/min   GFR calc Af Amer >90 >90 mL/min    Comment: (NOTE) The eGFR has been calculated using the CKD EPI equation. This calculation has not been validated in all clinical situations. eGFR's persistently <90 mL/min signify possible Chronic Kidney Disease.    Anion gap 8 5 - 15  Glucose, capillary     Status: Abnormal   Collection Time: 12/04/14  1:23 AM  Result Value Ref Range   Glucose-Capillary 191 (H) 70 - 99 mg/dL   Comment 1 Notify RN    Comment 2 Documented in Chart   Glucose, capillary     Status: Abnormal   Collection Time: 12/04/14  2:25 AM  Result Value Ref Range   Glucose-Capillary 201 (H) 70 - 99 mg/dL   Comment 1 Notify RN    Comment 2 Documented in Chart   Glucose, capillary     Status: Abnormal   Collection Time: 12/04/14  3:21 AM  Result Value Ref Range   Glucose-Capillary 145 (H) 70 - 99 mg/dL   Comment 1 Notify RN    Comment 2 Documented in Chart   Basic metabolic panel (stat then every 4 hours)     Status: Abnormal    Collection Time: 12/04/14  4:39 AM  Result Value Ref Range   Sodium 138 135 - 145 mmol/L    Comment: Please note change in reference range.   Potassium 3.7 3.5 - 5.1 mmol/L    Comment: Please note change in reference range.   Chloride 107 96 - 112 mEq/L   CO2 24 19 - 32 mmol/L   Glucose, Bld 106 (H) 70 - 99 mg/dL   BUN 10 6 - 23 mg/dL   Creatinine, Ser 0.76 0.50 - 1.35 mg/dL   Calcium 8.4 8.4 - 10.5 mg/dL   GFR calc non Af Amer >90 >90 mL/min   GFR calc Af Amer >90 >90 mL/min    Comment: (NOTE) The eGFR has been calculated using the CKD EPI equation. This calculation has not been validated in all clinical situations. eGFR's persistently <90 mL/min signify possible Chronic Kidney Disease.    Anion gap 7 5 - 15  CBC     Status: Abnormal   Collection Time: 12/04/14  4:39 AM  Result Value Ref Range   WBC 15.2 (H) 4.0 -  10.5 K/uL   RBC 3.38 (L) 4.22 - 5.81 MIL/uL   Hemoglobin 9.5 (L) 13.0 - 17.0 g/dL   HCT 29.5 (L) 39.0 - 52.0 %   MCV 87.3 78.0 - 100.0 fL   MCH 28.1 26.0 - 34.0 pg   MCHC 32.2 30.0 - 36.0 g/dL   RDW 16.9 (H) 11.5 - 15.5 %   Platelets 404 (H) 150 - 400 K/uL  Glucose, capillary     Status: Abnormal   Collection Time: 12/04/14  7:41 AM  Result Value Ref Range   Glucose-Capillary 41 (LL) 70 - 99 mg/dL   Comment 1 Documented in Chart    Comment 2 Notify RN   Glucose, capillary     Status: Abnormal   Collection Time: 12/04/14  8:27 AM  Result Value Ref Range   Glucose-Capillary 135 (H) 70 - 99 mg/dL  Glucose, capillary     Status: Abnormal   Collection Time: 12/04/14 11:16 AM  Result Value Ref Range   Glucose-Capillary 241 (H) 70 - 99 mg/dL  Glucose, capillary     Status: Abnormal   Collection Time: 12/04/14  4:21 PM  Result Value Ref Range   Glucose-Capillary 219 (H) 70 - 99 mg/dL   Comment 1 Documented in Chart    Comment 2 Notify RN   Glucose, capillary     Status: Abnormal   Collection Time: 12/04/14  9:01 PM  Result Value Ref Range   Glucose-Capillary  358 (H) 70 - 99 mg/dL  Glucose, capillary     Status: None   Collection Time: 12/05/14  2:42 AM  Result Value Ref Range   Glucose-Capillary 70 70 - 99 mg/dL   Comment 1 Notify RN   CBC     Status: Abnormal   Collection Time: 12/05/14  6:21 AM  Result Value Ref Range   WBC 9.1 4.0 - 10.5 K/uL   RBC 3.74 (L) 4.22 - 5.81 MIL/uL   Hemoglobin 10.4 (L) 13.0 - 17.0 g/dL   HCT 33.2 (L) 39.0 - 52.0 %   MCV 88.8 78.0 - 100.0 fL   MCH 27.8 26.0 - 34.0 pg   MCHC 31.3 30.0 - 36.0 g/dL   RDW 16.9 (H) 11.5 - 15.5 %   Platelets 478 (H) 150 - 400 K/uL  Basic metabolic panel     Status: Abnormal   Collection Time: 12/05/14  6:21 AM  Result Value Ref Range   Sodium 140 135 - 145 mmol/L    Comment: Please note change in reference range.   Potassium 3.9 3.5 - 5.1 mmol/L    Comment: Please note change in reference range.   Chloride 108 96 - 112 mEq/L   CO2 27 19 - 32 mmol/L   Glucose, Bld 63 (L) 70 - 99 mg/dL   BUN 12 6 - 23 mg/dL   Creatinine, Ser 0.57 0.50 - 1.35 mg/dL   Calcium 8.3 (L) 8.4 - 10.5 mg/dL   GFR calc non Af Amer >90 >90 mL/min   GFR calc Af Amer >90 >90 mL/min    Comment: (NOTE) The eGFR has been calculated using the CKD EPI equation. This calculation has not been validated in all clinical situations. eGFR's persistently <90 mL/min signify possible Chronic Kidney Disease.    Anion gap 5 5 - 15  Glucose, capillary     Status: Abnormal   Collection Time: 12/05/14  7:58 AM  Result Value Ref Range   Glucose-Capillary 51 (L) 70 - 99 mg/dL   Comment 1 Notify  RN   Glucose, capillary     Status: None   Collection Time: 12/05/14  8:27 AM  Result Value Ref Range   Glucose-Capillary 93 70 - 99 mg/dL   Comment 1 Notify RN   Glucose, capillary     Status: Abnormal   Collection Time: 12/05/14 11:40 AM  Result Value Ref Range   Glucose-Capillary 209 (H) 70 - 99 mg/dL   Comment 1 Notify RN    Labs are reviewed and are pertinent for medical issues being treated .  Current  Facility-Administered Medications  Medication Dose Route Frequency Provider Last Rate Last Dose  . 0.9 %  sodium chloride infusion   Intravenous Continuous Kathie Dike, MD 125 mL/hr at 12/05/14 1141    . ALPRAZolam Duanne Moron) tablet 0.5 mg  0.5 mg Oral BID PRN Kathie Dike, MD   0.5 mg at 12/05/14 1552  . chlorhexidine (PERIDEX) 0.12 % solution 15 mL  15 mL Mouth Rinse BID Kathie Dike, MD   15 mL at 12/04/14 0905  . enoxaparin (LOVENOX) injection 40 mg  40 mg Subcutaneous Q24H Kathie Dike, MD   40 mg at 12/04/14 1938  . guaiFENesin (MUCINEX) 12 hr tablet 600 mg  600 mg Oral BID PRN Dianne Dun, NP   600 mg at 12/05/14 0913  . Influenza vac split quadrivalent PF (FLUARIX) injection 0.5 mL  0.5 mL Intramuscular Tomorrow-1000 Oswald Hillock, MD   0.5 mL at 12/04/14 0907  . insulin aspart (novoLOG) injection 0-5 Units  0-5 Units Subcutaneous QHS Oswald Hillock, MD   5 Units at 12/04/14 2123  . insulin aspart (novoLOG) injection 0-9 Units  0-9 Units Subcutaneous TID WC Oswald Hillock, MD   3 Units at 12/05/14 1148  . insulin detemir (LEVEMIR) injection 45 Units  45 Units Subcutaneous QHS Kathie Dike, MD   45 Units at 12/04/14 2210  . meloxicam (MOBIC) tablet 15 mg  15 mg Oral Daily Kathie Dike, MD   15 mg at 12/05/14 0913  . Milnacipran (SAVELLA) tablet TABS 50 mg  50 mg Oral BID Kathie Dike, MD   50 mg at 12/05/14 0913  . pneumococcal 23 valent vaccine (PNU-IMMUNE) injection 0.5 mL  0.5 mL Intramuscular Tomorrow-1000 Oswald Hillock, MD   0.5 mL at 12/04/14 0907    Psychiatric Specialty Exam:     Blood pressure 143/80, pulse 107, temperature 97.9 F (36.6 C), temperature source Oral, resp. rate 20, height 6' (1.829 m), weight 69.1 kg (152 lb 5.4 oz), SpO2 99 %.Body mass index is 20.66 kg/(m^2).  General Appearance: Guarded  Eye Contact::  Poor  Speech:  Clear and Coherent and Pressured  Volume:  Increased  Mood:  Angry, Dysphoric, Irritable and Worthless  Affect:  Congruent   Thought Process:  Circumstantial, Coherent and Loose  Orientation:  Full (Time, Place, and Person)  Thought Content:  focused on psychosocial stressors   Suicidal Thoughts:  Yes.  without intent/plan  Homicidal Thoughts:  No  Memory:  Immediate;   Fair Recent;   Fair Remote;   Fair  Judgement:  Impaired  Insight:  Lacking  Psychomotor Activity:  Increased  Concentration:  Poor  Recall:  Fair  Akathisia:  No  Handed:  Right  AIMS (if indicated):     Assets:  Leisure Time Social Support  Sleep:      Treatment Plan Summary: recommend inpatient psychiatric admission when medically cleared        Kennedy Bucker  PMH-NP  12/05/2014 4:55 PM

## 2014-12-05 NOTE — Progress Notes (Signed)
Patient agitated and anxious all this shift. Patient voiced strong feelings about wanting to leave the hospital and go home and called his parents to come and get him earlier in the shift. Patient verbally abusive and threatening towards his parents in front of this Clinical research associatewriter. MD aware and tele psych ordered and complete.

## 2014-12-05 NOTE — Progress Notes (Signed)
[facility] PROGRESS NOTE  Paulo FruitJohn E Cohill XBJ:[id number] DOB: 27-Feb-1981 DOA: 12/03/2014 PCP: Catalina PizzaHALL, ZACH, MD  Assessment/Plan: 1. Diabetic ketoacidosis. Resolved with IV fluids and insulin infusion. He transitioned to subcutaneous insulin. Had a high blood sugar today after he had extra fluid that was brought in by his parents. He was covered with additional NovoLog. Continue to monitor blood sugars. 2. Dehydration. Improved with IV fluids. 3. Leukocytosis. Likely reactive/hemoconcentration. Improved with IV fluids. He does not appear septic or toxic. 4. Substance abuse. Patient will need further outpatient detox/rehabilitation program. 5. Suicidal ideations. Patient had expressed that he would try to commit suicide after he was discharged. Seen by psychiatry who felt he would be appropriate for inpatient psychiatry. Per recommendations of psychiatry, he was placed under involuntary commitment papers.  Code Status: full code Family Communication: discussed with patient and his parents Disposition Plan: patient will be transferred to inpatient psychiatry once bed is available. He is currently under involuntary commitment papers.   Consultants:  Psychiatry  Procedures:    Antibiotics:    HPI/Subjective: Patient reports that he his hungry. Denies any shortness of breath or pain. Indicated to his parents that after he leaves the hospital that he would try and commit suicide.  Objective: Filed Vitals:   12/05/14 0651  BP: 143/80  Pulse: 107  Temp: 97.9 F (36.6 C)  Resp: 20    Intake/Output Summary (Last 24 hours) at 12/05/14 1949 Last data filed at 12/05/14 0900  Gross per 24 hour  Intake 1744.58 ml  Output      0 ml  Net 1744.58 ml   Filed Weights   12/03/14 1106 12/03/14 2017 12/04/14 2024  Weight: 63.504 kg (140 lb) 64.9 kg (143 lb 1.3 oz) 69.1 kg (152 lb 5.4 oz)    Exam:   General:  NAD  Cardiovascular: S1, S2 RRR  Respiratory: CTA B  Abdomen:  soft, nt, nd, bs+  Musculoskeletal: no edema b/l   Data Reviewed: Basic Metabolic Panel:  Recent Labs Lab 12/03/14 1559 12/03/14 2021 12/04/14 0037 12/04/14 0439 12/05/14 0621  NA 136 134* 132* 138 140  K 4.8 4.5 4.1 3.7 3.9  CL 104 103 103 107 108  CO2 11* 18* 21 24 27   GLUCOSE 406* 299* 166* 106* 63*  BUN 18 18 13 10 12   CREATININE 1.29 1.24 0.88 0.76 0.57  CALCIUM 8.4 8.4 8.1* 8.4 8.3*   Liver Function Tests:  Recent Labs Lab 12/03/14 1138  AST 22  ALT 22  ALKPHOS 178*  BILITOT 1.4*  PROT 7.4  ALBUMIN 3.3*   No results for input(s): LIPASE, AMYLASE in the last 168 hours. No results for input(s): AMMONIA in the last 168 hours. CBC:  Recent Labs Lab 12/03/14 1138 12/04/14 0439 12/05/14 0621  WBC 16.3* 15.2* 9.1  NEUTROABS 13.3*  --   --   HGB 9.3* 9.5* 10.4*  HCT 28.6* 29.5* 33.2*  MCV 88.0 87.3 88.8  PLT 385 404* 478*   Cardiac Enzymes: No results for input(s): CKTOTAL, CKMB, CKMBINDEX, TROPONINI in the last 168 hours. BNP (last 3 results) No results for input(s): PROBNP in the last 8760 hours. CBG:  Recent Labs Lab 12/05/14 0758 12/05/14 0827 12/05/14 1140 12/05/14 1702 12/05/14 1851  GLUCAP 51* 93 209* 564* 308*    No results found for this or any previous visit (from the past 240 hour(s)).   Studies: Dg Chest Port 1 View  12/04/2014   CLINICAL DATA:  Acute cough. Leukocytosis. Dehydration. Current history of  diabetes.  EXAM: PORTABLE CHEST - 1 VIEW  COMPARISON:  11/02/2014 dating back to 06/19/2010.  FINDINGS: Suboptimal inspiration accounts for crowded bronchovascular markings diffusely and atelectasis in the bases, and accentuates the cardiac silhouette. Taking this into account, cardiomediastinal silhouette unremarkable and unchanged. Lungs otherwise clear. No localized airspace consolidation. No pleural effusions. No pneumothorax. Normal pulmonary vascularity.  IMPRESSION: Suboptimal inspiration accounts for bibasilar atelectasis. No  acute cardiopulmonary disease otherwise.   Electronically Signed   By: Hulan Saas M.D.   On: 12/04/2014 11:42    Scheduled Meds: . chlorhexidine  15 mL Mouth Rinse BID  . enoxaparin (LOVENOX) injection  40 mg Subcutaneous Q24H  . Influenza vac split quadrivalent PF  0.5 mL Intramuscular Tomorrow-1000  . insulin aspart  0-5 Units Subcutaneous QHS  . insulin aspart  0-9 Units Subcutaneous TID WC  . insulin detemir  45 Units Subcutaneous QHS  . meloxicam  15 mg Oral Daily  . Milnacipran  50 mg Oral BID  . pneumococcal 23 valent vaccine  0.5 mL Intramuscular Tomorrow-1000   Continuous Infusions: . sodium chloride 125 mL/hr at 12/05/14 1929    Principal Problem:   DKA, type 1 Active Problems:   Leukocytosis   Hyperglycemia   Substance abuse   Suicidal ideation    Time spent:    [facility]  [facility] Pager [phone]. If 7PM-7AM, please contact night-coverage at [email or web address], password [facility] 12/05/2014, 7:49 PM  LOS: 2 days

## 2014-12-06 LAB — GLUCOSE, CAPILLARY
GLUCOSE-CAPILLARY: 32 mg/dL — AB (ref 70–99)
GLUCOSE-CAPILLARY: 166 mg/dL — AB (ref 70–99)
Glucose-Capillary: 186 mg/dL — ABNORMAL HIGH (ref 70–99)
Glucose-Capillary: 128 mg/dL — ABNORMAL HIGH (ref 70–99)
Glucose-Capillary: 253 mg/dL — ABNORMAL HIGH (ref 70–99)
Glucose-Capillary: 219 mg/dL — ABNORMAL HIGH (ref 70–99)
Glucose-Capillary: 324 mg/dL — ABNORMAL HIGH (ref 70–99)

## 2014-12-06 LAB — BASIC METABOLIC PANEL
Anion gap: 8 (ref 5–15)
BUN: 11 mg/dL (ref 6–23)
CHLORIDE: 101 meq/L (ref 96–112)
CO2: 27 mmol/L (ref 19–32)
CREATININE: 0.64 mg/dL (ref 0.50–1.35)
Calcium: 8.5 mg/dL (ref 8.4–10.5)
GFR calc non Af Amer: >90 mL/min (ref >90)
Glucose, Bld: 202 mg/dL — ABNORMAL HIGH (ref 70–99)
Potassium: 3.2 mmol/L — ABNORMAL LOW (ref 3.5–5.1)
Sodium: 136 mmol/L (ref 135–145)

## 2014-12-06 MED ORDER — INSULIN ASPART 100 UNIT/ML ~~LOC~~ SOLN
4.0000 [IU] | Freq: Three times a day (TID) | SUBCUTANEOUS | Status: DC
  Administered 2014-12-06 – 2014-12-07 (×4): 4 [IU] via SUBCUTANEOUS

## 2014-12-06 MED ORDER — DEXTROSE 50 % IV SOLN
INTRAVENOUS | Status: AC
  Filled 2014-12-06: qty 50

## 2014-12-06 MED ORDER — DEXTROSE 50 % IV SOLN: 25.0000 mL | Freq: Once | INTRAVENOUS | Status: DC

## 2014-12-06 MED ORDER — INSULIN DETEMIR 100 UNIT/ML ~~LOC~~ SOLN
25.0000 [IU] | Freq: Every day | SUBCUTANEOUS | Status: DC
  Administered 2014-12-06: 25 [IU] via SUBCUTANEOUS
  Filled 2014-12-06 (×2): qty 0.25

## 2014-12-06 MED ORDER — POTASSIUM CHLORIDE CRYS ER 20 MEQ PO TBCR
40.0000 meq | EXTENDED_RELEASE_TABLET | Freq: Once | ORAL | Status: AC
  Administered 2014-12-06: 40 meq via ORAL
  Filled 2014-12-06: qty 2

## 2014-12-06 NOTE — BH Assessment (Signed)
Writer called and spoke w/ pt's RN BaristaCrystal. Writer explained that [facility] aware [facility] is now medically clear, however [facility]. Writer requested that Crystal get in touch with [facility] CSW who may contact other psych facilities in order to find inpatient treatment for pt. Crystal is aware that pt remains on TTS list of pt's who needs inpatient bed.  Evette Cristalaroline Paige Merelyn Klump, [place] Assessment Counselor [phone]

## 2014-12-06 NOTE — Progress Notes (Signed)
CRITICAL VALUE ALERT  Critical value received:  CBG 32  Date of notification:  12/06/2014  Time of notification: 0520  Critical value read back:Yes.    Nurse who received alert:  Richardean ChimeraMarion Ricquel Foulk  MD notified (1st page):  Dr Sharl MaLama   Time of first page:  0555  MD notified (2nd page):  Time of second page:  Responding MD:    Time MD responded:

## 2014-12-06 NOTE — Progress Notes (Signed)
Patients blood sugar responded to oral carbohydrates. His CBG was 128 when rechecked. Will continue to monitor the patient.

## 2014-12-06 NOTE — Progress Notes (Signed)
Patient noted to have low blood sugars this morning. His insulin dosing has been adjusted. Meal coverage novolog has been added and basal insulin has been decreased per Diabetes coordinator recommendations. He is awaiting transfer to behavioral health. I think it would be reasonable to medically clear him for discharge to behavioral health with medical consultation to follow to manage diabetes.  MEMON,JEHANZEB

## 2014-12-06 NOTE — Progress Notes (Signed)
Pt referral faxed to the following facilities:  [facility]  Facilities at Capacity: [facility] [place]  No Answer: [facility]  Will continue to seek placement.   Chad CordialLauren Carter, LCSWA 12/06/2014 12:56 PM

## 2014-12-06 NOTE — Clinical Social Work Note (Signed)
CSW spoke with Leotis ShamesLauren, disposition CSW, regarding pt. Notified her that pt is medically clear per MD. Leotis ShamesLauren will initiate inpatient bed search.   Derenda FennelKara Heitor Steinhoff, [place] [phone]

## 2014-12-06 NOTE — Progress Notes (Signed)
Inpatient Diabetes Program Recommendations  AACE/ADA: New Consensus Statement on Inpatient Glycemic Control (2013)  Target Ranges:  Prepandial:   less than 140 mg/dL      Peak postprandial:   less than 180 mg/dL (1-2 hours)      Critically ill patients:  140 - 180 mg/dL   Results for [name], Gatsby E (MRN [id number]) as of 12/06/2014 08:27  Ref. Range 12/05/2014 07:58 12/05/2014 08:27 12/05/2014 11:40 12/05/2014 17:02 12/05/2014 18:51 12/05/2014 20:48 12/06/2014 01:21 12/06/2014 05:19 12/06/2014 06:04 12/06/2014 07:27  Glucose-Capillary Latest Range: 70-99 mg/dL 51 (L) 93 782209 (H) 956564 (HH) 308 (H) 243 (H) 186 (H) 32 (LL) 128 (H) 253 (H)    Diabetes history: DM 1 Outpatient Diabetes medications: Levemir 45 units QHS, Novolog 5-35 units TID with meals. Current orders for Inpatient glycemic control: Levemir 45 units QHS, Novolog 0-9 units TID with meals  Inpatient Diabetes Program Recommendations Insulin - Basal: Noted pt had CBG 32 mg/dl at [id number] this am. Pt has had low CBGs for the past three mornings. Please reduce Levemir to 25 units QHS. Correction (SSI): Please consider adding Novolog 0-5 units QHS for bedtime coverage. Insulin - Meal Coverage: The patient being DM 1 will need carbohydrate coverage. Please consider ordering Novolog 4 units TID with meals for meal coverage in addition to correction scale.  Noted: Pt was recently admitted 11/02/14-11/04/14 and was placed on Levemir 25 units QHS, Novolog 4 units TID with meals for meal coverage. Pts CBGs at that time was fairly controlled.  Thanks,  Christena DeemShannon Joline Encalada RN, MSN, [facility] Inpatient Diabetes Coordinator Team Pager [phone]

## 2014-12-06 NOTE — Care Management Note (Addendum)
    Page 1 of 1   12/07/2014     2:17:27 PM CARE MANAGEMENT NOTE 12/07/2014  Patient:  [name],Carden E   Account Number:  [id number]  Date Initiated:  12/06/2014  Documentation initiated by:  Sharrie RothmanBLACKWELL,Camaya Gannett C  Subjective/Objective Assessment:   Pt admitted from home with DKA. Pt lives with his parents but will need inpatient psych treatment at discharge. Pt is indpedent with ADL's.     Action/Plan:   No CM needs noted. Pt will discharge to inpatient psych facility when bed found. CSW is assisting pt with placement.   Anticipated DC Date:  12/07/2014   Anticipated DC Plan:  PSYCHIATRIC HOSPITAL  In-house referral  Clinical Social Worker      DC Planning Services  CM consult      Choice offered to / List presented to:             Status of service:  Completed, signed off Medicare Important Message given?   (If response is "NO", the following Medicare IM given date fields will be blank) Date Medicare IM given:   Medicare IM given by:   Date Additional Medicare IM given:   Additional Medicare IM given by:    Discharge Disposition:  PSYCHIATRIC HOSPITAL  Per UR Regulation:    If discussed at Long Length of Stay Meetings, dates discussed:    Comments:  12/07/14 1415 Arlyss Queenammy Daja Shuping, RN BSN CM [facility]. No CM needs noted. Pt will transfer by law enforcement.  12/06/14 1335 Arlyss Queenammy Leavy Heatherly, RN BSN CM

## 2014-12-06 NOTE — Clinical Social Work Note (Signed)
CSW followed up with facilities. [facility] due to medical acuity. [facility].   Derenda FennelKara Hinda Lindor, [place]CS[place] [phone]

## 2014-12-06 NOTE — Progress Notes (Signed)
UR chart review completed.  

## 2014-12-06 NOTE — Progress Notes (Signed)
I was notified that the patients CBG was . I followed the hypoglycemic protocol and went in the give dextrose 50% to the patient, but the patient refused. I did provide oral carbohydrates to the patient. Paging the on-call MD, will follow orders given. I will recheck the patients CBG in 30 mins and continue to monitor the patient.

## 2014-12-06 NOTE — Progress Notes (Signed)
[facility] PROGRESS NOTE  Paulo FruitJohn E Pegues ZOX:[id number] DOB: 1980/11/29 DOA: 12/03/2014 PCP: Catalina PizzaHALL, ZACH, MD  Assessment/Plan: 1. Diabetic ketoacidosis. Resolved with IV fluids and insulin infusion. He has been transitioned to subcutaneous insulin. Blood sugars have been reasonably controlled today. Will adjust insulin based on diabetic coordinator recommendations. Continue to monitor blood sugars. 2. Dehydration. Improved with IV fluids. 3. Leukocytosis. Likely reactive/hemoconcentration. Improved with IV fluids. He does not appear septic or toxic. Resolved. 4. Substance abuse. Patient will need further outpatient detox/rehabilitation program. 5. Suicidal ideations. Patient had expressed that he would try to commit suicide after he was discharged. Seen by psychiatry who felt he would be appropriate for inpatient psychiatry. Per recommendations of psychiatry, he was placed under involuntary commitment papers. Patient is now medically cleared for discharge once a inpatient psychiatry bed is available  Code Status: full code Family Communication: discussed with patient and his parents Disposition Plan: patient will be transferred to inpatient psychiatry once bed is available. He is currently under involuntary commitment papers.   Consultants:  Psychiatry  Procedures:    Antibiotics:    HPI/Subjective: No new complaints today.  Objective: Filed Vitals:   12/06/14 1356  BP: 168/93  Pulse: 84  Temp: 97.6 F (36.4 C)  Resp: 20    Intake/Output Summary (Last 24 hours) at 12/06/14 1836 Last data filed at 12/06/14 1356  Gross per 24 hour  Intake   2355 ml  Output   1050 ml  Net   1305 ml   Filed Weights   12/03/14 1106 12/03/14 2017 12/04/14 2024  Weight: 63.504 kg (140 lb) 64.9 kg (143 lb 1.3 oz) 69.1 kg (152 lb 5.4 oz)    Exam:   General:  NAD  Cardiovascular: S1, S2 RRR  Respiratory: CTA B  Abdomen: soft, nt, nd, bs+  Musculoskeletal: no edema b/l    Data Reviewed: Basic Metabolic Panel:  Recent Labs Lab 12/03/14 2021 12/04/14 0037 12/04/14 0439 12/05/14 0621 12/06/14 0621  NA 134* 132* 138 140 136  K 4.5 4.1 3.7 3.9 3.2*  CL 103 103 107 108 101  CO2 18* 21 24 27 27   GLUCOSE 299* 166* 106* 63* 202*  BUN 18 13 10 12 11   CREATININE 1.24 0.88 0.76 0.57 0.64  CALCIUM 8.4 8.1* 8.4 8.3* 8.5   Liver Function Tests:  Recent Labs Lab 12/03/14 1138  AST 22  ALT 22  ALKPHOS 178*  BILITOT 1.4*  PROT 7.4  ALBUMIN 3.3*   No results for input(s): LIPASE, AMYLASE in the last 168 hours. No results for input(s): AMMONIA in the last 168 hours. CBC:  Recent Labs Lab 12/03/14 1138 12/04/14 0439 12/05/14 0621  WBC 16.3* 15.2* 9.1  NEUTROABS 13.3*  --   --   HGB 9.3* 9.5* 10.4*  HCT 28.6* 29.5* 33.2*  MCV 88.0 87.3 88.8  PLT 385 404* 478*   Cardiac Enzymes: No results for input(s): CKTOTAL, CKMB, CKMBINDEX, TROPONINI in the last 168 hours. BNP (last 3 results) No results for input(s): PROBNP in the last 8760 hours. CBG:  Recent Labs Lab 12/06/14 0519 12/06/14 0604 12/06/14 0727 12/06/14 1151 12/06/14 1602  GLUCAP 32* 128* 253* 166* 219*    No results found for this or any previous visit (from the past 240 hour(s)).   Studies: No results found.  Scheduled Meds: . chlorhexidine  15 mL Mouth Rinse BID  . dextrose  25 mL Intravenous Once  . enoxaparin (LOVENOX) injection  40 mg Subcutaneous Q24H  . Influenza vac  split quadrivalent PF  0.5 mL Intramuscular Tomorrow-1000  . insulin aspart  0-9 Units Subcutaneous TID WC  . insulin aspart  4 Units Subcutaneous TID WC  . insulin detemir  25 Units Subcutaneous QHS  . meloxicam  15 mg Oral Daily  . Milnacipran  50 mg Oral BID  . pneumococcal 23 valent vaccine  0.5 mL Intramuscular Tomorrow-1000  . potassium chloride  40 mEq Oral Once   Continuous Infusions: . sodium chloride 125 mL/hr at 12/05/14 1929    Principal Problem:   DKA, type 1 Active Problems:    Leukocytosis   Hyperglycemia   Substance abuse   Suicidal ideation    Time spent:    [facility]  [facility] Pager [phone]. If 7PM-7AM, please contact night-coverage at [email or web address], password [facility] 12/06/2014, 6:36 PM  LOS: 3 days

## 2014-12-07 ENCOUNTER — Inpatient Hospital Stay (HOSPITAL_COMMUNITY)
Admission: AD | Admit: 2014-12-07 | Discharge: 2014-12-13 | DRG: 881 | Disposition: A | Discharge status: Home / Self Care | Payer: BLUE CROSS/BLUE SHIELD | Source: Intra-hospital | Attending: Psychiatry | Admitting: Psychiatry

## 2014-12-07 ENCOUNTER — Encounter (HOSPITAL_COMMUNITY): Payer: Self-pay | Admitting: Emergency Medicine

## 2014-12-07 DIAGNOSIS — E876 Hypokalemia: Secondary | ICD-10-CM | POA: Diagnosis present

## 2014-12-07 DIAGNOSIS — F1120 Opioid dependence, uncomplicated: Secondary | ICD-10-CM | POA: Diagnosis present

## 2014-12-07 DIAGNOSIS — E1021 Type 1 diabetes mellitus with diabetic nephropathy: Secondary | ICD-10-CM | POA: Diagnosis present

## 2014-12-07 DIAGNOSIS — IMO0002 Reserved for concepts with insufficient information to code with codable children: Secondary | ICD-10-CM | POA: Insufficient documentation

## 2014-12-07 DIAGNOSIS — E871 Hypo-osmolality and hyponatremia: Secondary | ICD-10-CM | POA: Diagnosis present

## 2014-12-07 DIAGNOSIS — E162 Hypoglycemia, unspecified: Secondary | ICD-10-CM | POA: Diagnosis not present

## 2014-12-07 DIAGNOSIS — F329 Major depressive disorder, single episode, unspecified: Secondary | ICD-10-CM | POA: Diagnosis present

## 2014-12-07 DIAGNOSIS — E785 Hyperlipidemia, unspecified: Secondary | ICD-10-CM | POA: Diagnosis present

## 2014-12-07 DIAGNOSIS — F1994 Other psychoactive substance use, unspecified with psychoactive substance-induced mood disorder: Secondary | ICD-10-CM | POA: Diagnosis present

## 2014-12-07 DIAGNOSIS — M797 Fibromyalgia: Secondary | ICD-10-CM | POA: Diagnosis present

## 2014-12-07 DIAGNOSIS — Z794 Long term (current) use of insulin: Secondary | ICD-10-CM | POA: Diagnosis not present

## 2014-12-07 DIAGNOSIS — R739 Hyperglycemia, unspecified: Secondary | ICD-10-CM | POA: Insufficient documentation

## 2014-12-07 DIAGNOSIS — F411 Generalized anxiety disorder: Secondary | ICD-10-CM | POA: Diagnosis present

## 2014-12-07 DIAGNOSIS — Z590 Homelessness: Secondary | ICD-10-CM

## 2014-12-07 DIAGNOSIS — F1910 Other psychoactive substance abuse, uncomplicated: Secondary | ICD-10-CM | POA: Diagnosis not present

## 2014-12-07 DIAGNOSIS — R45851 Suicidal ideations: Secondary | ICD-10-CM | POA: Diagnosis not present

## 2014-12-07 DIAGNOSIS — F17210 Nicotine dependence, cigarettes, uncomplicated: Secondary | ICD-10-CM | POA: Diagnosis present

## 2014-12-07 DIAGNOSIS — E1010 Type 1 diabetes mellitus with ketoacidosis without coma: Secondary | ICD-10-CM | POA: Diagnosis not present

## 2014-12-07 DIAGNOSIS — D72829 Elevated white blood cell count, unspecified: Secondary | ICD-10-CM | POA: Diagnosis not present

## 2014-12-07 DIAGNOSIS — E1065 Type 1 diabetes mellitus with hyperglycemia: Secondary | ICD-10-CM | POA: Insufficient documentation

## 2014-12-07 LAB — CBC
HCT: 33.1 % — ABNORMAL LOW (ref 39.0–52.0)
HEMOGLOBIN: 10.3 g/dL — AB (ref 13.0–17.0)
MCH: 27.3 pg (ref 26.0–34.0)
MCHC: 31.1 g/dL (ref 30.0–36.0)
MCV: 87.8 fL (ref 78.0–100.0)
PLATELETS: 497 10*3/uL — AB (ref 150–400)
RBC: 3.77 MIL/uL — ABNORMAL LOW (ref 4.22–5.81)
RDW: 16.2 % — AB (ref 11.5–15.5)
WBC: 10.1 10*3/uL (ref 4.0–10.5)

## 2014-12-07 LAB — GLUCOSE, CAPILLARY
GLUCOSE-CAPILLARY: 235 mg/dL — AB (ref 70–99)
Glucose-Capillary: 127 mg/dL — ABNORMAL HIGH (ref 70–99)
Glucose-Capillary: 579 mg/dL (ref 70–99)

## 2014-12-07 LAB — URINALYSIS, ROUTINE W REFLEX MICROSCOPIC
Bilirubin Urine: NEGATIVE
Hgb urine dipstick: NEGATIVE
Ketones, ur: NEGATIVE mg/dL
LEUKOCYTES UA: NEGATIVE
NITRITE: NEGATIVE
PROTEIN: NEGATIVE mg/dL
SPECIFIC GRAVITY, URINE: 1.027 (ref 1.005–1.030)
UROBILINOGEN UA: 0.2 mg/dL (ref 0.0–1.0)
pH: 6.5 (ref 5.0–8.0)

## 2014-12-07 LAB — COMPREHENSIVE METABOLIC PANEL
ALK PHOS: 136 U/L — AB (ref 39–117)
ALT: 17 U/L (ref 0–53)
ANION GAP: 7 (ref 5–15)
AST: 22 U/L (ref 0–37)
Albumin: 3 g/dL — ABNORMAL LOW (ref 3.5–5.2)
BILIRUBIN TOTAL: 0.4 mg/dL (ref 0.3–1.2)
BUN: 31 mg/dL — ABNORMAL HIGH (ref 6–23)
CO2: 25 mmol/L (ref 19–32)
Calcium: 8.5 mg/dL (ref 8.4–10.5)
Chloride: 93 mEq/L — ABNORMAL LOW (ref 96–112)
Creatinine, Ser: 0.89 mg/dL (ref 0.50–1.35)
GFR calc non Af Amer: >90 mL/min (ref >90)
Glucose, Bld: 652 mg/dL (ref 70–99)
Potassium: 4.6 mmol/L (ref 3.5–5.1)
Sodium: 125 mmol/L — ABNORMAL LOW (ref 135–145)
Total Protein: 7 g/dL (ref 6.0–8.3)

## 2014-12-07 LAB — BLOOD GAS, VENOUS
Acid-Base Excess: 0.4 mmol/L (ref 0.0–2.0)
BICARBONATE: 24.8 meq/L — AB (ref 20.0–24.0)
O2 Saturation: 77.5 %
Patient temperature: 98.6
TCO2: 22.9 mmol/L (ref 0–100)
pCO2, Ven: 41.7 mmHg — ABNORMAL LOW (ref 45.0–50.0)
pH, Ven: 7.392 — ABNORMAL HIGH (ref 7.250–7.300)
pO2, Ven: 43.3 mmHg (ref 30.0–45.0)

## 2014-12-07 LAB — CBG MONITORING, ED
Glucose-Capillary: 588 mg/dL (ref 70–99)
Glucose-Capillary: 525 mg/dL — ABNORMAL HIGH (ref 70–99)

## 2014-12-07 LAB — URINE MICROSCOPIC-ADD ON: URINE-OTHER: NONE SEEN

## 2014-12-07 MED ORDER — INSULIN ASPART 100 UNIT/ML ~~LOC~~ SOLN
10.0000 [IU] | Freq: Once | SUBCUTANEOUS | Status: AC
  Administered 2014-12-07: 10 [IU] via SUBCUTANEOUS
  Filled 2014-12-07: qty 1

## 2014-12-07 MED ORDER — SODIUM CHLORIDE 0.9 % IV BOLUS (SEPSIS)
1000.0000 mL | Freq: Once | INTRAVENOUS | Status: AC
  Administered 2014-12-08: 1000 mL via INTRAVENOUS

## 2014-12-07 MED ORDER — KETOROLAC TROMETHAMINE 30 MG/ML IJ SOLN
30.0000 mg | Freq: Once | INTRAMUSCULAR | Status: DC
  Filled 2014-12-07: qty 1

## 2014-12-07 MED ORDER — MELOXICAM 15 MG PO TABS
15.0000 mg | ORAL_TABLET | Freq: Every day | ORAL | Status: DC
  Administered 2014-12-07: 15 mg via ORAL
  Filled 2014-12-07 (×2): qty 1

## 2014-12-07 MED ORDER — INSULIN DETEMIR 100 UNIT/ML ~~LOC~~ SOLN: 25.0000 [IU] | Freq: Every day | SUBCUTANEOUS | Status: DC

## 2014-12-07 MED ORDER — INSULIN ASPART 100 UNIT/ML ~~LOC~~ SOLN: 6.0000 [IU] | Freq: Three times a day (TID) | SUBCUTANEOUS | Status: DC

## 2014-12-07 MED ORDER — ACETAMINOPHEN 500 MG PO TABS
1000.0000 mg | ORAL_TABLET | Freq: Once | ORAL | Status: AC
  Administered 2014-12-07: 1000 mg via ORAL
  Filled 2014-12-07: qty 2

## 2014-12-07 NOTE — ED Notes (Signed)
Per EMS: [facility], CBG 596. Frequent urination, dry mouth. A&O x 4. Pt IVC'd for SI and aggression toward parents.

## 2014-12-07 NOTE — Progress Notes (Addendum)
Patient arrived to the search room and writer attempted to start admission.  Patient blood sugar taken and it was 579.  [facility].  Patient is angry and verbally aggressive but redirectable.  Patient signed some paper work and skin search was done.  Patient states he is increasingly thirsty and states he was in the hospital for a few days for DKA and was released today.  Donell SievertSpencer Simon PA and [facility] notified.   Will complete the rest of his admission when [facility] because patient had to be transported to the hospital [facility] for further evaluation.

## 2014-12-07 NOTE — Progress Notes (Signed)
[facility]. Report called to [place], [place]. Patient stable at time of transfer. [facility]. Commitment papers sent with officer. Patient's mother has been notified.

## 2014-12-07 NOTE — ED Notes (Signed)
Bed: WA01 Expected date:  Expected time:  Means of arrival:  Comments: [facility] hyperglycemia

## 2014-12-07 NOTE — Clinical Social Work Note (Signed)
[facility] and will transfer today.   Derenda FennelKara Olympia Adelsberger, [place]CSW [phone]

## 2014-12-07 NOTE — Discharge Summary (Signed)
Physician Discharge Summary  Tyler Mann JWJ:[id number] DOB: 24-Jan-1981 DOA: 12/03/2014  PCP: Catalina Pizza, MD  Admit date: 12/03/2014 Discharge date: 12/07/2014  Time spent: 40 minutes  Recommendations for Outpatient Follow-up:  1. Patient will be discharged to inpatient psychiatry for further care  Discharge Diagnoses:  Principal Problem:   DKA, type 1 Active Problems:   Leukocytosis   Hyperglycemia   Substance abuse   Suicidal ideation   Discharge Condition: improved  Diet recommendation: low carb  Filed Weights   12/03/14 1106 12/03/14 2017 12/04/14 2024  Weight: 63.504 kg (140 lb) 64.9 kg (143 lb 1.3 oz) 69.1 kg (152 lb 5.4 oz)    History of present illness:  This patient was admitted to the hospital with diabetic ketoacidosis. He was initially evaluated at [facility] for substance abuse detoxification. He was sent to the hospital for medical clearance and had diabetic ketoacidosis. He was admitted for further treatments  Hospital Course:  Patient was admitted to the hospital and started on intravenous fluids and insulin infusion per protocol. His anion gap closely was transitioned to subcutaneous insulin. His blood sugars are currently stable.  He was noted to have significant leukocytosis which is probably related to hemoconcentration/reactive. This resolved with IV fluids. He has not had any fevers and does not appear septic or toxic.  Patient has a significant history of heroin and amphetamine substance abuse. He appears to be in denial regarding the severity of his addiction. He will need further rehabilitation program  He did express suicidal ideations during his hospital stay. He was seen by psychiatry felt that he would benefit from inpatient psychiatry evaluation. He'll be transferred there later today for further evaluation.  Procedures:    Consultations:  psychiatry  Discharge Exam: Filed Vitals:   12/07/14 0532  BP: 149/93  Pulse: 88  Temp: 97.8  F (36.6 C)  Resp: 20    General: NAD Cardiovascular: S1, S2 RRR Respiratory: CTA B  Discharge Instructions   Discharge Instructions    Call MD for:  persistant nausea and vomiting    Complete by:  As directed      Call MD for:  temperature >100.4    Complete by:  As directed      Diet - low sodium heart healthy    Complete by:  As directed      Diet Carb Modified    Complete by:  As directed      Increase activity slowly    Complete by:  As directed      Increase activity slowly    Complete by:  As directed           Current Discharge Medication List    START taking these medications   Details  !! insulin aspart (NOVOLOG) 100 UNIT/ML injection Inject 6 Units into the skin 3 (three) times daily with meals. Qty: 10 mL, Refills: 11     !! - Potential duplicate medications found. Please discuss with provider.    CONTINUE these medications which have CHANGED   Details  insulin detemir (LEVEMIR) 100 UNIT/ML injection Inject 0.25 mLs (25 Units total) into the skin at bedtime. Qty: 10 mL, Refills: 11      CONTINUE these medications which have NOT CHANGED   Details  ALPRAZolam (XANAX) 0.5 MG tablet Take 0.5 mg by mouth 2 (two) times daily. Refills: 1    cyclobenzaprine (FLEXERIL) 10 MG tablet Take 10 mg by mouth every 8 (eight) hours as needed.     !!  insulin aspart (NOVOLOG) 100 UNIT/ML injection Please continue her sliding scale as you used to take at home Qty: 10 mL, Refills: 11    meloxicam (MOBIC) 15 MG tablet Take 15 mg by mouth daily.    Milnacipran (SAVELLA) 50 MG TABS tablet Take 50 mg by mouth 2 (two) times daily.     !! - Potential duplicate medications found. Please discuss with provider.    STOP taking these medications     chlorhexidine (PERIDEX) 0.12 % solution        No Known Allergies Follow-up Information    Follow up with Catalina PizzaHALL, ZACH, MD. Schedule an appointment as soon as possible for a visit in 2 weeks.   Specialty:  Internal Medicine    Contact information:    [address]  [place] [address] [phone]        The results of significant diagnostics from this hospitalization (including imaging, microbiology, ancillary and laboratory) are listed below for reference.    Significant Diagnostic Studies: Dg Chest Port 1 View  12/04/2014   CLINICAL DATA:  Acute cough. Leukocytosis. Dehydration. Current history of diabetes.  EXAM: PORTABLE CHEST - 1 VIEW  COMPARISON:  11/02/2014 dating back to 06/19/2010.  FINDINGS: Suboptimal inspiration accounts for crowded bronchovascular markings diffusely and atelectasis in the bases, and accentuates the cardiac silhouette. Taking this into account, cardiomediastinal silhouette unremarkable and unchanged. Lungs otherwise clear. No localized airspace consolidation. No pleural effusions. No pneumothorax. Normal pulmonary vascularity.  IMPRESSION: Suboptimal inspiration accounts for bibasilar atelectasis. No acute cardiopulmonary disease otherwise.   Electronically Signed   By: Hulan Saashomas  Lawrence M.D.   On: 12/04/2014 11:42    Microbiology: No results found for this or any previous visit (from the past 240 hour(s)).   Labs: Basic Metabolic Panel:  Recent Labs Lab 12/03/14 2021 12/04/14 0037 12/04/14 0439 12/05/14 0621 12/06/14 0621  NA 134* 132* 138 140 136  K 4.5 4.1 3.7 3.9 3.2*  CL 103 103 107 108 101  CO2 18* 21 24 27 27   GLUCOSE 299* 166* 106* 63* 202*  BUN 18 13 10 12 11   CREATININE 1.24 0.88 0.76 0.57 0.64  CALCIUM 8.4 8.1* 8.4 8.3* 8.5   Liver Function Tests:  Recent Labs Lab 12/03/14 1138  AST 22  ALT 22  ALKPHOS 178*  BILITOT 1.4*  PROT 7.4  ALBUMIN 3.3*   No results for input(s): LIPASE, AMYLASE in the last 168 hours. No results for input(s): AMMONIA in the last 168 hours. CBC:  Recent Labs Lab 12/03/14 1138 12/04/14 0439 12/05/14 0621  WBC 16.3* 15.2* 9.1  NEUTROABS 13.3*  --   --   HGB 9.3* 9.5* 10.4*  HCT 28.6* 29.5* 33.2*  MCV 88.0 87.3  88.8  PLT 385 404* 478*   Cardiac Enzymes: No results for input(s): CKTOTAL, CKMB, CKMBINDEX, TROPONINI in the last 168 hours. BNP: BNP (last 3 results) No results for input(s): PROBNP in the last 8760 hours. CBG:  Recent Labs Lab 12/06/14 1151 12/06/14 1602 12/06/14 2044 12/07/14 0750 12/07/14 1118  GLUCAP 166* 219* 324* 127* 235*       Signed:  MEMON,JEHANZEB  [facility] 12/07/2014, 1:59 PM

## 2014-12-07 NOTE — ED Provider Notes (Signed)
CSN: [id number]     Arrival date & time 12/07/14  2111 History   First MD Initiated Contact with Patient 12/07/14 2118     Chief Complaint  Patient presents with  . Hyperglycemia     (Consider location/radiation/quality/duration/timing/severity/associated sxs/prior Treatment) The history is provided by the patient and medical records. No language interpreter was used.     Tyler Mann is a 34 y.o. male  with a hx of insulin dependent diabetes, neuropathy, IV heroin usage, chronic back pain presents to the Emergency Department from the behavioral health Hospital due to hyperglycemia. Pt was transferred from [facility] after being hospitalized for DKA this morning and reports that he has not had his insulin since 9am.  Pt was getting his insulin at [facility].  Patient was transfer to behavioral health Hospital for detox from heroin. At that time he was not suicidal or homicidal.  Pt reports associated symptoms of polyuria, dry mouth and nausea.  Pt reports no emesis or fevers.  Patient reports he simply needs his insulin. He does report that he ate a large meal prior to arrival because he was hungry.  He denies fever, chills, headache, neck pain, chest pain, shortness of breath, abdominal pain, vomiting, diarrhea, weakness, dizziness, syncope.  35 units of Levamir and sliding scale Humalog at home.    Past Medical History  Diagnosis Date  . Diabetes mellitus   . Neuropathy   . Narcotic addiction     Hx IV heroin abuse  . Chronic back pain    Past Surgical History  Procedure Laterality Date  . Mouth surgery     No family history on file. History  Substance Use Topics  . Smoking status: Current Every Day Smoker -- 1.00 packs/day for 17 years    Types: Cigarettes  . Smokeless tobacco: Current User  . Alcohol Use: No    Review of Systems  Constitutional: Negative for fever, diaphoresis, appetite change, fatigue and unexpected weight change.  HENT: Negative for mouth sores.      Dry mouth  Eyes: Negative for visual disturbance.  Respiratory: Negative for cough, chest tightness, shortness of breath and wheezing.   Cardiovascular: Negative for chest pain.  Gastrointestinal: Positive for nausea. Negative for vomiting, abdominal pain, diarrhea and constipation.  Endocrine: Positive for polyuria. Negative for polydipsia and polyphagia.  Genitourinary: Negative for dysuria, urgency, frequency and hematuria.  Musculoskeletal: Negative for back pain and neck stiffness.  Skin: Negative for rash.  Allergic/Immunologic: Negative for immunocompromised state.  Neurological: Negative for syncope, light-headedness and headaches.  Hematological: Does not bruise/bleed easily.  Psychiatric/Behavioral: Negative for sleep disturbance. The patient is not nervous/anxious.       Allergies  Review of patient's allergies indicates no known allergies.  Home Medications   Prior to Admission medications   Medication Sig Start Date End Date Taking? Authorizing Provider  ALPRAZolam Prudy Feeler) 0.5 MG tablet Take 0.5 mg by mouth 2 (two) times daily. 11/20/14  Yes Historical Provider, MD  cyclobenzaprine (FLEXERIL) 10 MG tablet Take 10 mg by mouth every 8 (eight) hours as needed.    Yes Historical Provider, MD  insulin aspart (NOVOLOG) 100 UNIT/ML injection Please continue her sliding scale as you used to take at home Patient taking differently: Inject 5-35 Units into the skin 3 (three) times daily with meals. Please continue her sliding scale as you used to take at home 11/21/13  Yes Rhetta Mura, MD  insulin detemir (LEVEMIR) 100 UNIT/ML injection Inject 0.25 mLs (25 Units total)  into the skin at bedtime. Patient taking differently: Inject 35 Units into the skin at bedtime.  12/07/14  Yes Erick Blinks, MD  meloxicam (MOBIC) 15 MG tablet Take 15 mg by mouth daily.   Yes Historical Provider, MD  Milnacipran (SAVELLA) 50 MG TABS tablet Take 50 mg by mouth 2 (two) times daily.   Yes  Historical Provider, MD  insulin aspart (NOVOLOG) 100 UNIT/ML injection Inject 6 Units into the skin 3 (three) times daily with meals. Patient not taking: Reported on 12/07/2014 12/07/14   Erick Blinks, MD   BP 142/76 mmHg  Pulse 89  Temp(Src) 98.3 F (36.8 C) (Oral)  Resp 19  SpO2 99% Physical Exam  Constitutional: He is oriented to person, place, and time. He appears well-developed and well-nourished. No distress.  Awake, alert, nontoxic appearance  HENT:  Head: Normocephalic and atraumatic.  Right Ear: Tympanic membrane, external ear and ear canal normal.  Left Ear: Tympanic membrane, external ear and ear canal normal.  Nose: Nose normal. No epistaxis. Right sinus exhibits no maxillary sinus tenderness and no frontal sinus tenderness. Left sinus exhibits no maxillary sinus tenderness and no frontal sinus tenderness.  Mouth/Throat: Uvula is midline and oropharynx is clear and moist. Mucous membranes are not pale, dry and not cyanotic. No oropharyngeal exudate, posterior oropharyngeal edema, posterior oropharyngeal erythema or tonsillar abscesses.  Dry mucous membranes  Eyes: Conjunctivae are normal. Pupils are equal, round, and reactive to light. No scleral icterus.  Neck: Normal range of motion and full passive range of motion without pain. Neck supple.  Cardiovascular: Normal rate, regular rhythm, normal heart sounds and intact distal pulses.   No murmur heard. No tachycardia  Pulmonary/Chest: Effort normal and breath sounds normal. No stridor. No respiratory distress. He has no wheezes.  Equal chest expansion  Abdominal: Soft. Bowel sounds are normal. He exhibits no mass. There is no tenderness. There is no rebound and no guarding.  Musculoskeletal: Normal range of motion. He exhibits no edema.  Lymphadenopathy:    He has no cervical adenopathy.  Neurological: He is alert and oriented to person, place, and time.  Speech is clear and goal oriented Moves extremities without ataxia   Skin: Skin is warm and dry. No rash noted. He is not diaphoretic.  Psychiatric: He has a normal mood and affect.  Nursing note and vitals reviewed.   ED Course  Procedures (including critical care time) Labs Review Labs Reviewed  GLUCOSE, CAPILLARY - Abnormal; Notable for the following:    Glucose-Capillary 579 (*)    All other components within normal limits  CBC - Abnormal; Notable for the following:    RBC 3.77 (*)    Hemoglobin 10.3 (*)    HCT 33.1 (*)    RDW 16.2 (*)    Platelets 497 (*)    All other components within normal limits  COMPREHENSIVE METABOLIC PANEL - Abnormal; Notable for the following:    Sodium 125 (*)    Chloride 93 (*)    Glucose, Bld 652 (*)    BUN 31 (*)    Albumin 3.0 (*)    Alkaline Phosphatase 136 (*)    All other components within normal limits  URINALYSIS, ROUTINE W REFLEX MICROSCOPIC - Abnormal; Notable for the following:    Glucose, UA >1000 (*)    All other components within normal limits  BLOOD GAS, VENOUS - Abnormal; Notable for the following:    pH, Ven 7.392 (*)    pCO2, Ven 41.7 (*)  Bicarbonate 24.8 (*)    All other components within normal limits  CBG MONITORING, ED - Abnormal; Notable for the following:    Glucose-Capillary 588 (*)    All other components within normal limits  CBG MONITORING, ED - Abnormal; Notable for the following:    Glucose-Capillary 525 (*)    All other components within normal limits  CBG MONITORING, ED - Abnormal; Notable for the following:    Glucose-Capillary 304 (*)    All other components within normal limits  CBG MONITORING, ED - Abnormal; Notable for the following:    Glucose-Capillary 202 (*)    All other components within normal limits  URINE MICROSCOPIC-ADD ON    Imaging Review No results found.   EKG Interpretation None      MDM   Final diagnoses:  Hyperglycemia without ketosis   Ethaniel E Dobos presents with c/o hyperglycemia and CBG of > 500.  Pt reports he has not had his  insulin since [facility].  Will obtain labs, give fluids and reassess.     1:04 AM Patient given a total of 20 units of subcutaneous insulin. His blood sugar has decreased to 300. He reports he feels adequate. He is completing his second bolus of fluids and we will recheck of CBG.  Patient without signs, symptoms or lab evidence of DKA. He is tolerating by mouth without difficulty and has no  Vomiting.  He is afebrile and his vitals are stable.    BP 142/76 mmHg  Pulse 89  Temp(Src) 98.3 F (36.8 C) (Oral)  Resp 19  SpO2 99%  1:17 AM Blood sugar 202. Patient reports resolution of all of his symptoms. Will be discharged back to behavioral health.  He has been instructed to continue using his insulin as directed and this was written on his paperwork.  I have personally reviewed patient's vitals, nursing note and any pertinent labs or imaging.  I performed an focused physical exam; undressed when appropriate .    It has been determined that no acute conditions requiring further emergency intervention are present at this time. The patient/guardian have been advised of the diagnosis and plan. I reviewed any labs and imaging including any potential incidental findings. We have discussed signs and symptoms that warrant return to the ED and they are listed in the discharge instructions.    Vital signs are stable at discharge.   BP 142/76 mmHg  Pulse 89  Temp(Src) 98.3 F (36.8 C) (Oral)  Resp 19  SpO2 99%         Dierdre ForthHannah Diem Dicocco, PA-C 12/08/14 [id number]  Mirian MoMatthew Gentry, MD 12/10/14 [phone]

## 2014-12-07 NOTE — Progress Notes (Signed)
Inpatient Diabetes Program Recommendations  AACE/ADA: New Consensus Statement on Inpatient Glycemic Control (2013)  Target Ranges:  Prepandial:   less than 140 mg/dL      Peak postprandial:   less than 180 mg/dL (1-2 hours)      Critically ill patients:  140 - 180 mg/dL   Results for Tyler Mann, Tyler Mann (MRN [id number]) as of 12/07/2014 08:01  Ref. Range 12/06/2014 07:27 12/06/2014 11:51 12/06/2014 16:02 12/06/2014 20:44 12/07/2014 07:50  Glucose-Capillary Latest Range: 70-99 mg/dL 147253 (H) 829166 (H) 562219 (H) 324 (H) 127 (H)    Diabetes history: DM 1 Outpatient Diabetes medications: Levemir 45 units QHS, Novolog 5-35 units TID with meals. Current orders for Inpatient glycemic control: Levemir 25 units QHS, Novolog 0-9 units TID with meals, Novolog 4 units TID with meals for meal coverage  Inpatient Diabetes Program Recommendations Correction (SSI): Patients bedtime glucose was 324 mg/dl. Please consider adding Novolog 0-5 units QHS. Insulin - Meal Coverage: Please consider increasing meal coverage to Novolog 6 or 7 units TID with meals in addition to the correction scale.   Thanks,  Christena DeemShannon Ezra Denne RN, MSN, [facility] Inpatient Diabetes Coordinator Team Pager [phone]

## 2014-12-08 ENCOUNTER — Encounter (HOSPITAL_COMMUNITY): Payer: Self-pay | Admitting: Behavioral Health

## 2014-12-08 DIAGNOSIS — IMO0002 Reserved for concepts with insufficient information to code with codable children: Secondary | ICD-10-CM | POA: Insufficient documentation

## 2014-12-08 DIAGNOSIS — F1120 Opioid dependence, uncomplicated: Secondary | ICD-10-CM

## 2014-12-08 DIAGNOSIS — F1994 Other psychoactive substance use, unspecified with psychoactive substance-induced mood disorder: Secondary | ICD-10-CM | POA: Diagnosis present

## 2014-12-08 DIAGNOSIS — R739 Hyperglycemia, unspecified: Secondary | ICD-10-CM

## 2014-12-08 DIAGNOSIS — E1065 Type 1 diabetes mellitus with hyperglycemia: Secondary | ICD-10-CM

## 2014-12-08 LAB — GLUCOSE, CAPILLARY
GLUCOSE-CAPILLARY: 496 mg/dL — AB (ref 70–99)
Glucose-Capillary: 139 mg/dL — ABNORMAL HIGH (ref 70–99)
Glucose-Capillary: 258 mg/dL — ABNORMAL HIGH (ref 70–99)
Glucose-Capillary: 337 mg/dL — ABNORMAL HIGH (ref 70–99)
Glucose-Capillary: 170 mg/dL — ABNORMAL HIGH (ref 70–99)

## 2014-12-08 LAB — CBG MONITORING, ED
GLUCOSE-CAPILLARY: 202 mg/dL — AB (ref 70–99)
GLUCOSE-CAPILLARY: 304 mg/dL — AB (ref 70–99)

## 2014-12-08 MED ORDER — INSULIN DETEMIR 100 UNIT/ML ~~LOC~~ SOLN
36.0000 [IU] | Freq: Every day | SUBCUTANEOUS | Status: DC
  Administered 2014-12-08: 36 [IU] via SUBCUTANEOUS

## 2014-12-08 MED ORDER — INSULIN ASPART 100 UNIT/ML ~~LOC~~ SOLN
12.0000 [IU] | Freq: Three times a day (TID) | SUBCUTANEOUS | Status: DC
  Administered 2014-12-08 – 2014-12-09 (×2): 12 [IU] via SUBCUTANEOUS

## 2014-12-08 MED ORDER — MELOXICAM 15 MG PO TABS
15.0000 mg | ORAL_TABLET | Freq: Every day | ORAL | Status: DC
  Administered 2014-12-08 – 2014-12-13 (×6): 15 mg via ORAL
  Filled 2014-12-08 (×8): qty 1

## 2014-12-08 MED ORDER — HYDROXYZINE HCL 25 MG PO TABS: 25.0000 mg | ORAL_TABLET | Freq: Four times a day (QID) | ORAL | Status: AC | PRN

## 2014-12-08 MED ORDER — INSULIN ASPART 100 UNIT/ML ~~LOC~~ SOLN
0.0000 [IU] | Freq: Three times a day (TID) | SUBCUTANEOUS | Status: DC
  Administered 2014-12-08: 15 [IU] via SUBCUTANEOUS
  Administered 2014-12-08: 8 [IU] via SUBCUTANEOUS
  Administered 2014-12-08: 11 [IU] via SUBCUTANEOUS

## 2014-12-08 MED ORDER — INSULIN DETEMIR 100 UNIT/ML ~~LOC~~ SOLN: 30.0000 [IU] | Freq: Every day | SUBCUTANEOUS | Status: DC

## 2014-12-08 MED ORDER — TRAZODONE HCL 50 MG PO TABS
50.0000 mg | ORAL_TABLET | Freq: Every evening | ORAL | Status: DC | PRN
  Filled 2014-12-08 (×13): qty 1
  Filled 2014-12-08 (×2): qty 6
  Filled 2014-12-08: qty 1

## 2014-12-08 MED ORDER — CYCLOBENZAPRINE HCL 10 MG PO TABS
10.0000 mg | ORAL_TABLET | Freq: Three times a day (TID) | ORAL | Status: DC | PRN
  Administered 2014-12-08 – 2014-12-12 (×5): 10 mg via ORAL
  Filled 2014-12-08 (×6): qty 1

## 2014-12-08 MED ORDER — ACETAMINOPHEN 325 MG PO TABS: 650.0000 mg | ORAL_TABLET | Freq: Four times a day (QID) | ORAL | Status: DC | PRN

## 2014-12-08 MED ORDER — ALPRAZOLAM 0.5 MG PO TABS: 0.5000 mg | ORAL_TABLET | Freq: Two times a day (BID) | ORAL | Status: DC

## 2014-12-08 MED ORDER — DICYCLOMINE HCL 20 MG PO TABS
20.0000 mg | ORAL_TABLET | Freq: Four times a day (QID) | ORAL | Status: AC | PRN
  Filled 2014-12-08: qty 1

## 2014-12-08 MED ORDER — LOPERAMIDE HCL 2 MG PO CAPS: 2.0000 mg | ORAL_CAPSULE | ORAL | Status: AC | PRN

## 2014-12-08 MED ORDER — METHOCARBAMOL 500 MG PO TABS
500.0000 mg | ORAL_TABLET | Freq: Three times a day (TID) | ORAL | Status: AC | PRN
  Filled 2014-12-08: qty 1

## 2014-12-08 MED ORDER — INSULIN DETEMIR 100 UNIT/ML ~~LOC~~ SOLN: 25.0000 [IU] | Freq: Every day | SUBCUTANEOUS | Status: DC

## 2014-12-08 MED ORDER — INSULIN ASPART 100 UNIT/ML ~~LOC~~ SOLN
0.0000 [IU] | Freq: Three times a day (TID) | SUBCUTANEOUS | Status: DC
  Administered 2014-12-09: 15 [IU] via SUBCUTANEOUS
  Administered 2014-12-10 (×2): 4 [IU] via SUBCUTANEOUS
  Administered 2014-12-10: 20 [IU] via SUBCUTANEOUS
  Administered 2014-12-11: 7 [IU] via SUBCUTANEOUS
  Administered 2014-12-11: 11 [IU] via SUBCUTANEOUS
  Administered 2014-12-12: 7 [IU] via SUBCUTANEOUS
  Administered 2014-12-13: 15 [IU] via SUBCUTANEOUS

## 2014-12-08 MED ORDER — MAGNESIUM HYDROXIDE 400 MG/5ML PO SUSP: 30.0000 mL | Freq: Every day | ORAL | Status: DC | PRN

## 2014-12-08 MED ORDER — INSULIN ASPART 100 UNIT/ML ~~LOC~~ SOLN: 0.0000 [IU] | Freq: Every day | SUBCUTANEOUS | Status: DC

## 2014-12-08 MED ORDER — CLONIDINE HCL 0.1 MG PO TABS
0.1000 mg | ORAL_TABLET | Freq: Every day | ORAL | Status: AC
  Administered 2014-12-11 – 2014-12-12 (×2): 0.1 mg via ORAL
  Filled 2014-12-08 (×2): qty 1

## 2014-12-08 MED ORDER — MILNACIPRAN HCL 50 MG PO TABS
50.0000 mg | ORAL_TABLET | Freq: Two times a day (BID) | ORAL | Status: DC
  Administered 2014-12-08 – 2014-12-13 (×12): 50 mg via ORAL
  Filled 2014-12-08: qty 6
  Filled 2014-12-08 (×2): qty 1
  Filled 2014-12-08: qty 6
  Filled 2014-12-08 (×13): qty 1

## 2014-12-08 MED ORDER — NAPROXEN 500 MG PO TABS
500.0000 mg | ORAL_TABLET | Freq: Two times a day (BID) | ORAL | Status: AC | PRN
  Administered 2014-12-09 – 2014-12-12 (×4): 500 mg via ORAL
  Filled 2014-12-08 (×4): qty 1

## 2014-12-08 MED ORDER — ALPRAZOLAM 0.5 MG PO TABS
0.5000 mg | ORAL_TABLET | Freq: Three times a day (TID) | ORAL | Status: DC | PRN
  Administered 2014-12-08 – 2014-12-13 (×11): 0.5 mg via ORAL
  Filled 2014-12-08 (×11): qty 1

## 2014-12-08 MED ORDER — INSULIN ASPART 100 UNIT/ML ~~LOC~~ SOLN: 6.0000 [IU] | Freq: Three times a day (TID) | SUBCUTANEOUS | Status: DC

## 2014-12-08 MED ORDER — CLONIDINE HCL 0.1 MG PO TABS
0.1000 mg | ORAL_TABLET | Freq: Four times a day (QID) | ORAL | Status: AC
  Administered 2014-12-08 – 2014-12-09 (×8): 0.1 mg via ORAL
  Filled 2014-12-08 (×11): qty 1

## 2014-12-08 MED ORDER — CLONIDINE HCL 0.1 MG PO TABS
0.1000 mg | ORAL_TABLET | ORAL | Status: AC
  Administered 2014-12-09 – 2014-12-11 (×4): 0.1 mg via ORAL
  Filled 2014-12-08 (×4): qty 1

## 2014-12-08 MED ORDER — ONDANSETRON 4 MG PO TBDP
4.0000 mg | ORAL_TABLET | Freq: Four times a day (QID) | ORAL | Status: AC | PRN
  Filled 2014-12-08: qty 1

## 2014-12-08 MED ORDER — GLUCERNA SHAKE PO LIQD
237.0000 mL | Freq: Two times a day (BID) | ORAL | Status: DC
  Administered 2014-12-09 – 2014-12-11 (×5): 237 mL via ORAL

## 2014-12-08 MED ORDER — ALUM & MAG HYDROXIDE-SIMETH 200-200-20 MG/5ML PO SUSP
30.0000 mL | ORAL | Status: DC | PRN
  Administered 2014-12-12: 30 mL via ORAL
  Filled 2014-12-08: qty 30

## 2014-12-08 NOTE — BHH Suicide Risk Assessment (Signed)
[facility] Ad[facility] Suicide Risk Assessment   Nursing information obtained from:  Patient Demographic factors:  Male, Caucasian, Low socioeconomic status, Unemployed Current Mental Status:  Suicidal ideation indicated by others Loss Factors:  Financial problems / change in socioeconomic status, Decline in physical health, Decrease in vocational status Historical Factors:  Family history of mental illness or substance abuse Risk Reduction Factors:  Sense of responsibility to family, Positive therapeutic relationship Total Time spent with patient: 45 minutes Principal Problem: Substance Dependence, concerns about safety Diagnosis:   Patient Active Problem List   Diagnosis Date Noted  . Substance induced mood disorder [F19.94] 12/08/2014  . Suicidal ideation [R45.851] 12/05/2014    Class: Acute  . Hyperglycemia [R73.9] 12/03/2014  . Substance abuse [F19.10] 12/03/2014  . Hypokalemia [E87.6] 11/03/2014  . Leukocytosis [D72.829] 11/02/2014  . Hyperkalemia [E87.5] 11/02/2014  . Chronic back pain [M54.9, G89.29] 11/02/2014  . Hematemesis [K92.0] 11/02/2014  . Generalized anxiety disorder [F41.1] 11/02/2014  . DKA (diabetic ketoacidoses) [E13.10] 11/19/2013  . DKA, type 1 [E10.10] 11/22/2012  . Opiate dependence [F11.20] 11/22/2012  . Tobacco abuse [Z72.0] 11/22/2012  . Diabetic nephropathy [E11.21] 11/22/2012  . Diabetic ketoacidosis [E13.10] 09/20/2012  . Diabetes mellitus type 1 [E10.9] 09/20/2012  . Chronic pain [G89.29] 09/20/2012  . Hyperlipidemia [E78.5] 09/20/2012     Continued Clinical Symptoms:  Alcohol Use Disorder Identification Test Final Score (AUDIT): 0 The "Alcohol Use Disorders Identification Test", Guidelines for Use in Primary Care, Second Edition.  World Science writerHealth Organization [facility](WHO). Score between 0-7:  no or low risk or alcohol related problems. Score between 8-15:  moderate risk of alcohol related problems. Score between 16-19:  high risk of alcohol related  problems. Score 20 or above:  warrants further diagnostic evaluation for alcohol dependence and treatment.   CLINICAL FACTORS:  Recent suicidal statements, recent threatening statements made to parents. Substance Dependence. At this time patient minimizes above, denies any SI or HI.    Musculoskeletal: Strength & Muscle Tone: within normal limits Gait & Station: normal Patient leans: N/A  Psychiatric Specialty Exam: Physical Exam  ROS  Blood pressure 105/81, pulse 107, temperature 97.7 F (36.5 C), temperature source Oral, resp. rate 18, height 6' (1.829 m), weight 163 lb 8 oz (74.163 kg), SpO2 99 %.Body mass index is 22.17 kg/(m^2).  SEE ADMIT NOTE MSE   COGNITIVE FEATURES THAT CONTRIBUTE TO RISK:  Closed-mindedness and Thought constriction (tunnel vision)    SUICIDE RISK:   Moderate:  Frequent suicidal ideation with limited intensity, and duration, some specificity in terms of plans, no associated intent, good self-control, limited dysphoria/symptomatology, some risk factors present, and identifiable protective factors, including available and accessible social support.  PLAN OF CARE: Patient will be admitted to inpatient psychiatric unit for stabilization and safety. Will provide and encourage milieu participation. Provide medication management and maked adjustments as needed.  Will follow daily.    Medical Decision Making:  Review of Psycho-Social Stressors (1), Review or order clinical lab tests (1), Established Problem, Worsening (2) and Review of Medication Regimen & Side Effects (2)  I certify that inpatient services furnished can reasonably be expected to improve the patient's condition.   COBOS, FERNANDO 12/08/2014, 4:21 PM

## 2014-12-08 NOTE — Progress Notes (Signed)
D   Pt is appropriate and pleasant   He interacts well with peers   He continues to endorse anxiety and depression   He is well versed on his diabetes regimen  A    Verbal support given   Medications administered and effectiveness monitored   Q 15 min checks  R    Pt safe at present
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
## 2014-12-08 NOTE — Progress Notes (Signed)
Pt did not attend group this evening.  

## 2014-12-08 NOTE — B[facility] Group Notes (Signed)
   [facility] After[facility] Dis[facility] Planning [facility] Note  12/08/2014  8:45 AM   Participation Quality: Alert, Appropriate and Oriented  Mood/Affect: Appropriate  Depression Rating: 0  Anxiety Rating: 0  Thoughts of Suicide: Pt denies SI/HI  Will you contract for safety? Yes  Current AVH: Pt denies  Plan for Discharge/Comments: Pt attended discharge planning group and actively participated in group. CSW provided pt with today's workbook. Patient reports feeling "wonderful" today and states that he is here due to a misunderstanding. Patient reports that he is here for treatment because his parents believe that he needs treatment for substance abuse. Patient denies having issues with substance abuse. Patient is currently homeless and [place] He is considering outpatient services.  Transportation Means: CSW continuing to assess  Supports: No supports mentioned at this time  Samuella BruinKristin Jara Feider, MSW, [employer]CSWA Clinical Social Worker [facility] [phone]

## 2014-12-08 NOTE — Tx Team (Signed)
Initial Interdisciplinary Treatment Plan   PATIENT STRESSORS: Financial difficulties Health problems Marital or family conflict Substance abuse   PATIENT STRENGTHS: Active sense of humor Communication skills Motivation for treatment/growth Supportive family/friends   PROBLEM LIST: Problem List/Patient Goals Date to be addressed Date deferred Reason deferred Estimated date of resolution  "I want to show my parents I can be drug free" 12/08/2014     "I want to get on the right medications" 12/08/2014     "I have anxiety" 12/08/2014                                          DISCHARGE CRITERIA:  Adequate post-discharge living arrangements Medical problems require only outpatient monitoring Safe-care adequate arrangements made Withdrawal symptoms are absent or subacute and managed without 24-hour nursing intervention  PRELIMINARY DISCHARGE PLAN: Attend aftercare/continuing care group Outpatient therapy Placement in alternative living arrangements  PATIENT/FAMIILY INVOLVEMENT: This treatment plan has been presented to and reviewed with the patient, Paulo FruitJohn E Fetherolf.  The patient and family have been given the opportunity to ask questions and make suggestions.  Angeline SlimHill, Ramina Hulet M 12/08/2014, 3:43 AM

## 2014-12-08 NOTE — Progress Notes (Addendum)
[facility]. 34 y/o with a history of type 1 DM, neuropathy and substance abuse.  Patient states he has been using Heroine and crystal meth daily.  Patient states his last use was 12/03/2014.  Patient states, "I do not have a drug problem but my parents want to makes sure I am clean before I come live with them."  Patient states although he uses heroine and crystal meth almost daily he can stop cold [place] if he wants to.  Patient states he is currently homeless and his parents are willing to let him live with them through the winter if he does not use drugs.  Patient states, "I really don't want to be here but my mom over reacted and told that lady on the tv lies."  Patient states he thinks he is here because people thought he would hurt his mother but patient states he would never hurt his mother because he is scared of her.  Patient states he does not have a job but states it is his main stressor.  Patient states, "If I had a job I would be all right."  Patient denies SI/HI and AVH. Per patient notes his parent states he told them he would kill himself if he left the hospital.  Patient completely denies this and states his mother overreacted. Patient states he has been a type 1 diabetic for 30 years and is very brittle.  Patient [facility] patient was sent to the ED and now is back and blood sugar is 139.  Patient states he has been eating well since he has been in the hospital.  Patient states he has prescription medications that he takes.  Patient demanding an aggressive verbally when he speaks.  Patient also uses profanity.  Patient skin assessed and patient has  small scabs to toes.  Patient also has red area to right AC.  Patient belongings secured in locker #16. Food and fluids offered and patient accepted more water.  Consents obtained, fall safety plan explained and patient verbalized  understanding.  Patient escorted and oriented to the unit.  Patient offered no additional questions or concerns.

## 2014-12-08 NOTE — Plan of Care (Signed)
Problem: Consults Goal: Suicide Risk Patient Education (See Patient Education module for education specifics)  Outcome: Completed/Met Date Met:  12/08/14 Nurse discussed suicidal thoughts with patient.

## 2014-12-08 NOTE — Progress Notes (Addendum)
Patient woke up from nap and requested CBG be taken.  CBG at 16145 was 496.  NP informed.  NP informed and ordered nurse to give 15 unit novolog now, call diabetes coordinator.  NP instructed nurse to do the following:  Hospitalist has been called by MD twice.   Switchboard stated Dr Jama Flavorsobos has called twice.  [facility]  CBG to be taken again at bedtime.  If CBG 300 or over, send patient to ED.  Nurse called diabetes coordinator and left message for call back.  Safety maintained and will continue to monitor patient.  New MD orders written.  Novolog 15 units sq given per NP May at 1625.  New MD order for novolog 12 units sq given to patient. Call has been made to diabetic consult per NP instructions.

## 2014-12-08 NOTE — H&P (Signed)
Psychiatric Admission Assessment Adult  Patient Identification: Tyler Mann MRN:  [id number] Date of Evaluation:  12/08/2014 Chief Complaint: " I really do not think I need to be here" Principal Diagnosis: Opiate Dependence, Depression Diagnosis:   Patient Active Problem List   Diagnosis Date Noted  . Substance induced mood disorder [F19.94] 12/08/2014  . Suicidal ideation [R45.851] 12/05/2014    Class: Acute  . Hyperglycemia [R73.9] 12/03/2014  . Substance abuse [F19.10] 12/03/2014  . Hypokalemia [E87.6] 11/03/2014  . Leukocytosis [D72.829] 11/02/2014  . Hyperkalemia [E87.5] 11/02/2014  . Chronic back pain [M54.9, G89.29] 11/02/2014  . Hematemesis [K92.0] 11/02/2014  . Generalized anxiety disorder [F41.1] 11/02/2014  . DKA (diabetic ketoacidoses) [E13.10] 11/19/2013  . DKA, type 1 [E10.10] 11/22/2012  . Opiate dependence [F11.20] 11/22/2012  . Tobacco abuse [Z72.0] 11/22/2012  . Diabetic nephropathy [E11.21] 11/22/2012  . Diabetic ketoacidosis [E13.10] 09/20/2012  . Diabetes mellitus type 1 [E10.9] 09/20/2012  . Chronic pain [G89.29] 09/20/2012  . Hyperlipidemia [E78.5] 09/20/2012   History of Present Illness:: Patient is a 34 year old man, who has a history of substance dependence ( IV opiates and Methamphetamine) and  Who presented to ED with decompensated DM ( has Diabetes Mellitus I since childhood) and DKA. He was stabilized. He has had significant turmoil with his parents , and reportedly they kicked him out of the house in December 2015, so he has been homeless for several weeks. During psychiatric assessment in ED patient made suicidal statements such as stating his parents would be burying him soon, and he was also observed to be belligerent and verbally threatening to parents. Because of this patient was committed ( IVC ) .  Patient minimizes above, and states " I never said I was going to hurt myself, I am not suicidal " , and denies any thoughts , plans or intention of  hurting his parents. His insight is limited, and states " I do not think drugs are a problem for me, I could put them down if I wanted to, I just choose to use sometimes". Today denies depression or any significant neuro-vegetative symptoms of depression. He does present dysphoric and mildly irritable. Elements:  Worsening psychosocial stressors in the context of drug abuse/dependence  Associated Signs/Symptoms: Depression Symptoms:  denies anhedonia, denies sadness, describes good sense of self esteem. Does state he has been losing weight- 20-30 lbs over recent months (Hypo) Manic Symptoms:  Denies  Anxiety Symptoms:  Denies  Psychotic Symptoms:  Denies  PTSD Symptoms: Denies  Total Time spent with patient: 45 minutes  Past Medical History:  Past Medical History  Diagnosis Date  . Diabetes mellitus   . Neuropathy   . Narcotic addiction     Hx IV heroin abuse  . Chronic back pain     Past Surgical History  Procedure Laterality Date  . Mouth surgery    Psychiatric History- states he was admitted to a psychiatric unit once as a teenager, does not remember circumstances, denies history of psychosis, denies history of mania, denies history of suicide attempts, denies history of violence . States he has never been on psychiatric medications Family History: parents alive, live together, one sibling,  Mother has MD. Denies history of substance abuse or suicides or psychiatric illness in family Social History: homeless over recent weeks. Had been recently kicked out from parents' home.  Denies legal issues . Single , no children, currently unemployed . History  Alcohol Use No     History  Drug Use  .  Yes  . Special: Methamphetamines, Heroin    Comment: recovering from heroin-     History   Social History  . Marital Status: Single    Spouse Name: N/A    Number of Children: N/A  . Years of Education: N/A   Social History Main Topics  . Smoking status: Current Every Day Smoker --  1.00 packs/day for 17 years    Types: Cigarettes  . Smokeless tobacco: Current User  . Alcohol Use: No  . Drug Use: Yes    Special: Methamphetamines, Heroin     Comment: recovering from heroin-   . Sexual Activity: Yes   Other Topics Concern  . None   Social History Narrative   Additional Social History:    Pain Medications: n/a Prescriptions: see pts med list Over the Counter: n/a History of alcohol / drug use?: Yes Longest period of sobriety (when/how long): 5 years Negative Consequences of Use: Financial, Personal relationships Withdrawal Symptoms: Weakness, Irritability, Agitation Name of Substance 1: heroine 1 - Age of First Use: 23 1 - Amount (size/oz): 1 gram 1 - Frequency: daily 1 - Duration: 10 years 1 - Last Use / Amount: 12/02/2014 Name of Substance 2: Crystal Meth 2 - Age of First Use: 31 2 - Amount (size/oz): 1/2 gram 2 - Frequency: daily 2 - Duration: 2 years 2 - Last Use / Amount: 11/29/2014                 Musculoskeletal: Strength & Muscle Tone: within normal limits Gait & Station: normal Patient leans: N/A  Psychiatric Specialty Exam: Physical Exam  Review of Systems  Constitutional: Positive for weight loss. Negative for fever and chills.  Respiratory: Negative for cough and shortness of breath.   Cardiovascular: Negative for chest pain.  Gastrointestinal: Negative for vomiting.  Skin: Negative for rash.  Neurological: Negative for headaches.  Psychiatric/Behavioral: Positive for substance abuse.    Blood pressure 105/81, pulse 107, temperature 97.7 F (36.5 C), temperature source Oral, resp. rate 18, height 6' (1.829 m), weight 163 lb 8 oz (74.163 kg), SpO2 99 %.Body mass index is 22.17 kg/(m^2).  General Appearance: Fairly Groomed  Patent attorney::  Good  Speech:  Normal Rate  Volume:  Normal  Mood:  Dysphoric and Irritable  Affect:  Congruent  Thought Process:  Goal Directed and Linear  Orientation:  Full (Time, Place, and Person)   Thought Content:  denies hallucinations, no delusions  Suicidal Thoughts:  No patient denies any suicidal ideations, and denies any thoughts, plans or intention of hurting parents  Homicidal Thoughts:  No  Memory:  Recent and remote fair  Judgement:  Impaired  Insight:  Lacking  Psychomotor Activity:  Normal  Concentration:  Good  Recall:  Good  Fund of Knowledge:Good  Language: Good  Akathisia:  Negative  Handed:  Right  AIMS (if indicated):     Assets:  Resilience Social Support  ADL's:  Impaired  Cognition: WNL  Sleep:  Number of Hours: 2.5   Risk to Self: Is patient at risk for suicide?: Yes What has been your use of drugs/alcohol within the last 12 months?: Heroin and meth use, vague on details of use. Does not see substance abuse as an issue for him Risk to Others:   Prior Inpatient Therapy:   Prior Outpatient Therapy:    Alcohol Screening: 1. How often do you have a drink containing alcohol?: Never 9. Have you or someone else been injured as a result of your drinking?: No  10. Has a relative or friend or a doctor or another health worker been concerned about your drinking or suggested you cut down?: No Alcohol Use Disorder Identification Test Final Score (AUDIT): 0 Brief Intervention: AUDIT score less than 7 or less-screening does not suggest unhealthy drinking-brief intervention not indicated  Allergies:  No Known Allergies Lab Results:  Results for orders placed or performed during the hospital encounter of 12/07/14 (from the past 48 hour(s))  Glucose, capillary     Status: Abnormal   Collection Time: 12/07/14  8:46 PM  Result Value Ref Range   Glucose-Capillary 579 (HH) 70 - 99 mg/dL  CBC     Status: Abnormal   Collection Time: 12/07/14  9:34 PM  Result Value Ref Range   WBC 10.1 4.0 - 10.5 K/uL   RBC 3.77 (L) 4.22 - 5.81 MIL/uL   Hemoglobin 10.3 (L) 13.0 - 17.0 g/dL   HCT 33.1 (L) 39.0 - 52.0 %   MCV 87.8 78.0 - 100.0 fL   MCH 27.3 26.0 - 34.0 pg   MCHC  31.1 30.0 - 36.0 g/dL   RDW 16.2 (H) 11.5 - 15.5 %   Platelets 497 (H) 150 - 400 K/uL  Comprehensive metabolic panel     Status: Abnormal   Collection Time: 12/07/14  9:34 PM  Result Value Ref Range   Sodium 125 (L) 135 - 145 mmol/L    Comment: Please note change in reference range.   Potassium 4.6 3.5 - 5.1 mmol/L    Comment: Please note change in reference range.   Chloride 93 (L) 96 - 112 mEq/L   CO2 25 19 - 32 mmol/L   Glucose, Bld 652 (HH) 70 - 99 mg/dL    Comment: REPEATED TO VERIFY CRITICAL RESULT CALLED TO, READ BACK BY AND VERIFIED WITH: T.DOSTER,RN AT 2241 ON 12/07/14 BY SHEA.W    BUN 31 (H) 6 - 23 mg/dL   Creatinine, Ser 0.89 0.50 - 1.35 mg/dL   Calcium 8.5 8.4 - 10.5 mg/dL   Total Protein 7.0 6.0 - 8.3 g/dL   Albumin 3.0 (L) 3.5 - 5.2 g/dL   AST 22 0 - 37 U/L   ALT 17 0 - 53 U/L   Alkaline Phosphatase 136 (H) 39 - 117 U/L   Total Bilirubin 0.4 0.3 - 1.2 mg/dL   GFR calc non Af Amer >90 >90 mL/min   GFR calc Af Amer >90 >90 mL/min    Comment: (NOTE) The eGFR has been calculated using the CKD EPI equation. This calculation has not been validated in all clinical situations. eGFR's persistently <90 mL/min signify possible Chronic Kidney Disease.    Anion gap 7 5 - 15  CBG monitoring, ED     Status: Abnormal   Collection Time: 12/07/14  9:35 PM  Result Value Ref Range   Glucose-Capillary 588 (HH) 70 - 99 mg/dL   Comment 1 Notify RN   Blood gas, venous     Status: Abnormal   Collection Time: 12/07/14  9:43 PM  Result Value Ref Range   pH, Ven 7.392 (H) 7.250 - 7.300   pCO2, Ven 41.7 (L) 45.0 - 50.0 mmHg   pO2, Ven 43.3 30.0 - 45.0 mmHg   Bicarbonate 24.8 (H) 20.0 - 24.0 mEq/L   TCO2 22.9 0 - 100 mmol/L   Acid-Base Excess 0.4 0.0 - 2.0 mmol/L   O2 Saturation 77.5 %   Patient temperature 98.6    Collection site VEIN    Drawn by Vonda Antigua, PHLEBOTOMIST  Sample type VEIN   Urinalysis, Routine w reflex microscopic     Status: Abnormal   Collection Time:  12/07/14 10:02 PM  Result Value Ref Range   Color, Urine YELLOW YELLOW   APPearance CLEAR CLEAR   Specific Gravity, Urine 1.027 1.005 - 1.030   pH 6.5 5.0 - 8.0   Glucose, UA >1000 (A) NEGATIVE mg/dL   Hgb urine dipstick NEGATIVE NEGATIVE   Bilirubin Urine NEGATIVE NEGATIVE   Ketones, ur NEGATIVE NEGATIVE mg/dL   Protein, ur NEGATIVE NEGATIVE mg/dL   Urobilinogen, UA 0.2 0.0 - 1.0 mg/dL   Nitrite NEGATIVE NEGATIVE   Leukocytes, UA NEGATIVE NEGATIVE  Urine microscopic-add on     Status: None   Collection Time: 12/07/14 10:02 PM  Result Value Ref Range   Urine-Other      NO FORMED ELEMENTS SEEN ON URINE MICROSCOPIC EXAMINATION  CBG monitoring, ED     Status: Abnormal   Collection Time: 12/07/14 11:14 PM  Result Value Ref Range   Glucose-Capillary 525 (H) 70 - 99 mg/dL   Comment 1 Notify RN   POC CBG, ED     Status: Abnormal   Collection Time: 12/08/14 12:27 AM  Result Value Ref Range   Glucose-Capillary 304 (H) 70 - 99 mg/dL  POC CBG, ED     Status: Abnormal   Collection Time: 12/08/14  1:11 AM  Result Value Ref Range   Glucose-Capillary 202 (H) 70 - 99 mg/dL  Glucose, capillary     Status: Abnormal   Collection Time: 12/08/14  2:03 AM  Result Value Ref Range   Glucose-Capillary 139 (H) 70 - 99 mg/dL  Glucose, capillary     Status: Abnormal   Collection Time: 12/08/14  6:12 AM  Result Value Ref Range   Glucose-Capillary 258 (H) 70 - 99 mg/dL   Current Medications: Current Facility-Administered Medications  Medication Dose Route Frequency Provider Last Rate Last Dose  . acetaminophen (TYLENOL) tablet 650 mg  650 mg Oral Q6H PRN Laverle Hobby, PA-C      . ALPRAZolam Duanne Moron) tablet 0.5 mg  0.5 mg Oral TID PRN Laverle Hobby, PA-C   0.5 mg at 12/08/14 0248  . alum & mag hydroxide-simeth (MAALOX/MYLANTA) 200-200-20 MG/5ML suspension 30 mL  30 mL Oral Q4H PRN Laverle Hobby, PA-C      . cloNIDine (CATAPRES) tablet 0.1 mg  0.1 mg Oral QID Laverle Hobby, PA-C   0.1 mg at  12/08/14 1138   Followed by  . [START ON 12/10/2014] cloNIDine (CATAPRES) tablet 0.1 mg  0.1 mg Oral BH-qamhs Spencer E Simon, PA-C       Followed by  . [START ON 12/12/2014] cloNIDine (CATAPRES) tablet 0.1 mg  0.1 mg Oral QAC breakfast Laverle Hobby, PA-C      . cyclobenzaprine (FLEXERIL) tablet 10 mg  10 mg Oral TID PRN Laverle Hobby, PA-C      . dicyclomine (BENTYL) tablet 20 mg  20 mg Oral Q6H PRN Laverle Hobby, PA-C      . feeding supplement (GLUCERNA SHAKE) (GLUCERNA SHAKE) liquid 237 mL  237 mL Oral BID BM Clayton Bibles, RD      . hydrOXYzine (ATARAX/VISTARIL) tablet 25 mg  25 mg Oral Q6H PRN Laverle Hobby, PA-C      . insulin aspart (novoLOG) injection 0-15 Units  0-15 Units Subcutaneous TID WC Laverle Hobby, PA-C   11 Units at 12/08/14 1159  . insulin aspart (novoLOG) injection 0-5 Units  0-5 Units Subcutaneous QHS Laverle Hobby, PA-C      . insulin detemir (LEVEMIR) injection 25 Units  25 Units Subcutaneous QHS Sheila May Agustin, NP      . loperamide (IMODIUM) capsule 2-4 mg  2-4 mg Oral PRN Laverle Hobby, PA-C      . magnesium hydroxide (MILK OF MAGNESIA) suspension 30 mL  30 mL Oral Daily PRN Laverle Hobby, PA-C      . meloxicam (MOBIC) tablet 15 mg  15 mg Oral Daily Laverle Hobby, PA-C   15 mg at 12/08/14 0841  . methocarbamol (ROBAXIN) tablet 500 mg  500 mg Oral Q8H PRN Laverle Hobby, PA-C      . Milnacipran (SAVELLA) tablet TABS 50 mg  50 mg Oral BID Laverle Hobby, PA-C   50 mg at 12/08/14 9200  . naproxen (NAPROSYN) tablet 500 mg  500 mg Oral BID PRN Laverle Hobby, PA-C      . ondansetron (ZOFRAN-ODT) disintegrating tablet 4 mg  4 mg Oral Q6H PRN Laverle Hobby, PA-C      . traZODone (DESYREL) tablet 50 mg  50 mg Oral QHS,MR X 1 Laverle Hobby, PA-C       PTA Medications: Prescriptions prior to admission  Medication Sig Dispense Refill Last Dose  . ALPRAZolam (XANAX) 0.5 MG tablet Take 0.5 mg by mouth 2 (two) times daily.  1 12/07/2014 at Unknown time   . insulin aspart (NOVOLOG) 100 UNIT/ML injection Please continue her sliding scale as you used to take at home (Patient taking differently: Inject 5-35 Units into the skin 3 (three) times daily with meals. Please continue her sliding scale as you used to take at home) 10 mL 11 Past Week at Unknown time  . insulin detemir (LEVEMIR) 100 UNIT/ML injection Inject 0.25 mLs (25 Units total) into the skin at bedtime. (Patient taking differently: Inject 35 Units into the skin at bedtime. ) 10 mL 11 Past Week at Unknown time  . meloxicam (MOBIC) 15 MG tablet Take 15 mg by mouth daily.   12/07/2014 at Unknown time  . Milnacipran (SAVELLA) 50 MG TABS tablet Take 50 mg by mouth 2 (two) times daily.   Past Week at Unknown time  . cyclobenzaprine (FLEXERIL) 10 MG tablet Take 10 mg by mouth every 8 (eight) hours as needed.    Unknown at Unknown time  . insulin aspart (NOVOLOG) 100 UNIT/ML injection Inject 6 Units into the skin 3 (three) times daily with meals. (Patient not taking: Reported on 12/07/2014) 10 mL 11     Previous Psychotropic Medications: Yes- Xanax   Substance Abuse History in the last 12 months:  Yes.  - patient states he last used opiates/mehtamphetamine 6 days ago or so, at which time he became ill ( DKA) resulting in medical admission.    Consequences of Substance Abuse: Medical Consequences:  weight loss, decompesated DM/ Social /Relationship disruption  Results for orders placed or performed during the hospital encounter of 12/07/14 (from the past 72 hour(s))  Glucose, capillary     Status: Abnormal   Collection Time: 12/07/14  8:46 PM  Result Value Ref Range   Glucose-Capillary 579 (HH) 70 - 99 mg/dL  CBC     Status: Abnormal   Collection Time: 12/07/14  9:34 PM  Result Value Ref Range   WBC 10.1 4.0 - 10.5 K/uL   RBC 3.77 (L) 4.22 - 5.81 MIL/uL   Hemoglobin 10.3 (L) 13.0 - 17.0 g/dL  HCT 33.1 (L) 39.0 - 52.0 %   MCV 87.8 78.0 - 100.0 fL   MCH 27.3 26.0 - 34.0 pg   MCHC 31.1  30.0 - 36.0 g/dL   RDW 16.2 (H) 11.5 - 15.5 %   Platelets 497 (H) 150 - 400 K/uL  Comprehensive metabolic panel     Status: Abnormal   Collection Time: 12/07/14  9:34 PM  Result Value Ref Range   Sodium 125 (L) 135 - 145 mmol/L    Comment: Please note change in reference range.   Potassium 4.6 3.5 - 5.1 mmol/L    Comment: Please note change in reference range.   Chloride 93 (L) 96 - 112 mEq/L   CO2 25 19 - 32 mmol/L   Glucose, Bld 652 (HH) 70 - 99 mg/dL    Comment: REPEATED TO VERIFY CRITICAL RESULT CALLED TO, READ BACK BY AND VERIFIED WITH: T.DOSTER,RN AT 2241 ON 12/07/14 BY SHEA.W    BUN 31 (H) 6 - 23 mg/dL   Creatinine, Ser 0.89 0.50 - 1.35 mg/dL   Calcium 8.5 8.4 - 10.5 mg/dL   Total Protein 7.0 6.0 - 8.3 g/dL   Albumin 3.0 (L) 3.5 - 5.2 g/dL   AST 22 0 - 37 U/L   ALT 17 0 - 53 U/L   Alkaline Phosphatase 136 (H) 39 - 117 U/L   Total Bilirubin 0.4 0.3 - 1.2 mg/dL   GFR calc non Af Amer >90 >90 mL/min   GFR calc Af Amer >90 >90 mL/min    Comment: (NOTE) The eGFR has been calculated using the CKD EPI equation. This calculation has not been validated in all clinical situations. eGFR's persistently <90 mL/min signify possible Chronic Kidney Disease.    Anion gap 7 5 - 15  CBG monitoring, ED     Status: Abnormal   Collection Time: 12/07/14  9:35 PM  Result Value Ref Range   Glucose-Capillary 588 (HH) 70 - 99 mg/dL   Comment 1 Notify RN   Blood gas, venous     Status: Abnormal   Collection Time: 12/07/14  9:43 PM  Result Value Ref Range   pH, Ven 7.392 (H) 7.250 - 7.300   pCO2, Ven 41.7 (L) 45.0 - 50.0 mmHg   pO2, Ven 43.3 30.0 - 45.0 mmHg   Bicarbonate 24.8 (H) 20.0 - 24.0 mEq/L   TCO2 22.9 0 - 100 mmol/L   Acid-Base Excess 0.4 0.0 - 2.0 mmol/L   O2 Saturation 77.5 %   Patient temperature 98.6    Collection site VEIN    Drawn by Vonda Antigua, PHLEBOTOMIST    Sample type VEIN   Urinalysis, Routine w reflex microscopic     Status: Abnormal   Collection Time:  12/07/14 10:02 PM  Result Value Ref Range   Color, Urine YELLOW YELLOW   APPearance CLEAR CLEAR   Specific Gravity, Urine 1.027 1.005 - 1.030   pH 6.5 5.0 - 8.0   Glucose, UA >1000 (A) NEGATIVE mg/dL   Hgb urine dipstick NEGATIVE NEGATIVE   Bilirubin Urine NEGATIVE NEGATIVE   Ketones, ur NEGATIVE NEGATIVE mg/dL   Protein, ur NEGATIVE NEGATIVE mg/dL   Urobilinogen, UA 0.2 0.0 - 1.0 mg/dL   Nitrite NEGATIVE NEGATIVE   Leukocytes, UA NEGATIVE NEGATIVE  Urine microscopic-add on     Status: None   Collection Time: 12/07/14 10:02 PM  Result Value Ref Range   Urine-Other      NO FORMED ELEMENTS SEEN ON URINE MICROSCOPIC EXAMINATION  CBG monitoring, ED  Status: Abnormal   Collection Time: 12/07/14 11:14 PM  Result Value Ref Range   Glucose-Capillary 525 (H) 70 - 99 mg/dL   Comment 1 Notify RN   POC CBG, ED     Status: Abnormal   Collection Time: 12/08/14 12:27 AM  Result Value Ref Range   Glucose-Capillary 304 (H) 70 - 99 mg/dL  POC CBG, ED     Status: Abnormal   Collection Time: 12/08/14  1:11 AM  Result Value Ref Range   Glucose-Capillary 202 (H) 70 - 99 mg/dL  Glucose, capillary     Status: Abnormal   Collection Time: 12/08/14  2:03 AM  Result Value Ref Range   Glucose-Capillary 139 (H) 70 - 99 mg/dL  Glucose, capillary     Status: Abnormal   Collection Time: 12/08/14  6:12 AM  Result Value Ref Range   Glucose-Capillary 258 (H) 70 - 99 mg/dL    Observation Level/Precautions:  15 minute checks  Laboratory:  monitor glycemia, patient agrees to Hep C, HIV, Hep B screening   Psychotherapy:  Supportive, milieu  Medications:  Insulin, Xanax PRNs. Will discuss with patient rationale to taper off BZDs.   Consultations:  Hospitalist Consult regarding DM management.   Discharge Concerns:   Homelesness, social support ( family) issues  Estimated LOS: 5 days   Other:     Psychological Evaluations: No   Treatment Plan Summary: Daily contact with patient to assess and evaluate  symptoms and progress in treatment, Medication management, Plan Admit to inpatient unit  and monitor mood and affect, taper BZDs.  Medical Decision Making:  Review or order clinical lab tests (1), Established Problem, Worsening (2), Review of Last Therapy Session (1) and Review of Medication Regimen & Side Effects (2)  I certify that inpatient services furnished can reasonably be expected to improve the patient's condition.   COBOS, FERNANDO 1/20/20164:00 PM

## 2014-12-08 NOTE — [facility] Group Notes (Signed)
[facility] LCSW Group Therapy 12/08/2014  1:15 PM Type of Therapy: Group Therapy Participation Level: Active  Participation Quality: Attentive, Sharing and Supportive  Affect: Appropriate  Cognitive: Alert and Oriented  Insight: Developing/Improving and Engaged  Engagement in Therapy: Developing/Improving and Engaged  Modes of Intervention: Clarification, Confrontation, Discussion, Education, Exploration, Limit-setting, Orientation, Problem-solving, Rapport Building, Dance movement psychotherapisteality Testing, Socialization and Support  Summary of Progress/Problems: The topic for group today was emotional regulation. This group focused on both positive and negative emotion identification and allowed group members to process ways to identify feelings, regulate negative emotions, and find healthy ways to manage internal/external emotions. Group members were asked to reflect on a time when their reaction to an emotion led to a negative outcome and explored how alternative responses using emotion regulation would have benefited them. Group members were also asked to discuss a time when emotion regulation was utilized when a negative emotion was experienced. Patient discussed his need to manage his anger in a healthier way. CSW facilitated group discussion on identifying signs of anger and healthy ways of releasing anger. CSW and other group members provided patient with emotional support and encouragement. Patient also provided support to other group members.   Tyler BruinKristin Raheen Mann, MSW, [employer]CSWA Clinical Social Worker [facility] [phone]

## 2014-12-08 NOTE — Progress Notes (Signed)
Spoke with Dr. Jama Flavorsobos. Insulin sliding scale changed to resistant instead of moderate. Added novolog 12 units with meals and lantus increased to 36 units at bedtime.  In regards to hyponatremia will repeat BMP in am and Dr. Jama Flavorsobos will call me if sodium still low and if it is then [facility]. Manson Passeylma Zerline Melchior [facility] [phone].

## 2014-12-08 NOTE — Discharge Instructions (Signed)
1. Medications: usual home medications - Including your insulin as prescribed. 2. Treatment: rest, drink plenty of fluids 3. Follow Up: Please followup with your primary doctor in 7 days for discussion of your diagnoses and further evaluation after today's visit; if you do not have a primary care doctor use the resource guide provided to find one; Please return to the ER for worsening symptoms.

## 2014-12-08 NOTE — Progress Notes (Signed)
NUTRITION ASSESSMENT  Pt identified as at risk on the Malnutrition Screen Tool  INTERVENTION: 1. Educated patient on the importance of nutrition and encouraged intake of food and beverages. 2. Discussed weight goals. 3. Supplements: Glucerna Shake po BID, each supplement provides 220 kcal and 10 grams of protein   NUTRITION DIAGNOSIS: Unintentional weight loss related to sub-optimal intake as evidenced by pt report.   Goal: Pt to meet >/= 90% of their estimated nutrition needs.  Monitor:  PO intake  Assessment:  Pt admitted with history of substance abuse (heroin), SI and Type 1 diabetes.  Pt states he has lost 30 lb and his UBW is 180 lb. Pt states he was homeless for almost 2 months PTA, he didn't always have access to food. Pt states he will live with his mother after discharge. Denies any appetite changes. Pt with Type 1 diabetes, RD to order Glucerna shakes BID.   Height: Ht Readings from Last 1 Encounters:  12/08/14 6' (1.829 m)    Weight: Wt Readings from Last 1 Encounters:  12/08/14 163 lb 8 oz (74.163 kg)    Weight Hx: Wt Readings from Last 10 Encounters:  12/08/14 163 lb 8 oz (74.163 kg)  11/04/14 160 lb (72.576 kg)  09/18/14 190 lb (86.183 kg)  11/20/13 167 lb 8.8 oz (76 kg)  04/03/13 163 lb 12.8 oz (74.3 kg)  01/21/13 166 lb (75.297 kg)  11/24/12 160 lb 4.4 oz (72.7 kg)  09/20/12 160 lb 11.5 oz (72.9 kg)  05/27/11 174 lb (78.926 kg)    BMI:  Body mass index is 22.17 kg/(m^2). Pt meets criteria for normal range based on current BMI.  Estimated Nutritional Needs: Kcal: 25-30 kcal/kg Protein: > 1 gram protein/kg Fluid: 1 ml/kcal  Diet Order: Diet regular Pt is also offered choice of unit snacks mid-morning and mid-afternoon.  Pt is eating as desired.   Lab results and medications reviewed.   Tilda FrancoLindsey Stellar Gensel, MS, RD, LDN Pager: [phone] After Hours Pager: [phone]

## 2014-12-08 NOTE — Progress Notes (Signed)
D:  Patient's self inventory sheet, patient did not sleep last night, was admitted to hospital last night.  Good appetite, low energy level, good concentration.  Denied depression, hopeless, anxiety.  Denied withdrawals.  Denied physical problems.  Denied pain.  Goal is to "make it through end of day".  Does have discharge plans.  No problems anticipated after discharge.  "Lady on computer screen said I was a threat to my mama but I'm not.  Don't know why I'm here.  I wouldn't hurt an ant crawling across the floor." A:  Medications administered per MD orders.  Emotional support and encouragement given patient. R:  Denied SI and HI, contracts for safety.  Denied A/V hallucinations.  Safety maintained with 15 minute checks.

## 2014-12-09 DIAGNOSIS — E871 Hypo-osmolality and hyponatremia: Secondary | ICD-10-CM

## 2014-12-09 LAB — BASIC METABOLIC PANEL
ANION GAP: 11 (ref 5–15)
ANION GAP: 10 (ref 5–15)
BUN: 23 mg/dL (ref 6–23)
BUN: 23 mg/dL (ref 6–23)
CALCIUM: 8.7 mg/dL (ref 8.4–10.5)
CALCIUM: 8.7 mg/dL (ref 8.4–10.5)
CHLORIDE: 99 meq/L (ref 96–112)
CO2: 27 mmol/L (ref 19–32)
CO2: 27 mmol/L (ref 19–32)
CREATININE: 0.86 mg/dL (ref 0.50–1.35)
Chloride: 94 mEq/L — ABNORMAL LOW (ref 96–112)
Creatinine, Ser: 0.8 mg/dL (ref 0.50–1.35)
GFR calc Af Amer: >90 mL/min (ref >90)
GFR calc non Af Amer: >90 mL/min (ref >90)
GLUCOSE: 171 mg/dL — AB (ref 70–99)
Glucose, Bld: 64 mg/dL — ABNORMAL LOW (ref 70–99)
Potassium: 3.3 mmol/L — ABNORMAL LOW (ref 3.5–5.1)
Potassium: 4.3 mmol/L (ref 3.5–5.1)
Sodium: 132 mmol/L — ABNORMAL LOW (ref 135–145)
Sodium: 136 mmol/L (ref 135–145)

## 2014-12-09 LAB — HEPATITIS B SURFACE ANTIGEN: Hepatitis B Surface Ag: NEGATIVE

## 2014-12-09 LAB — HEPATITIS C ANTIBODY: HCV AB: NEGATIVE

## 2014-12-09 LAB — GLUCOSE, CAPILLARY
GLUCOSE-CAPILLARY: 196 mg/dL — AB (ref 70–99)
GLUCOSE-CAPILLARY: 343 mg/dL — AB (ref 70–99)
Glucose-Capillary: 39 mg/dL — CL (ref 70–99)
Glucose-Capillary: 83 mg/dL (ref 70–99)
Glucose-Capillary: 168 mg/dL — ABNORMAL HIGH (ref 70–99)

## 2014-12-09 LAB — HEMOGLOBIN A1C
Hgb A1c MFr Bld: 10.1 % — ABNORMAL HIGH (ref <5.7)
MEAN PLASMA GLUCOSE: 243 mg/dL — AB (ref <117)

## 2014-12-09 MED ORDER — POTASSIUM CHLORIDE CRYS ER 20 MEQ PO TBCR
40.0000 meq | EXTENDED_RELEASE_TABLET | Freq: Once | ORAL | Status: AC
  Administered 2014-12-09: 40 meq via ORAL
  Filled 2014-12-09 (×2): qty 2

## 2014-12-09 MED ORDER — INSULIN DETEMIR 100 UNIT/ML ~~LOC~~ SOLN
32.0000 [IU] | Freq: Every day | SUBCUTANEOUS | Status: DC
  Administered 2014-12-09: 32 [IU] via SUBCUTANEOUS

## 2014-12-09 MED ORDER — INSULIN ASPART 100 UNIT/ML ~~LOC~~ SOLN
10.0000 [IU] | Freq: Three times a day (TID) | SUBCUTANEOUS | Status: DC
  Administered 2014-12-09 – 2014-12-13 (×8): 10 [IU] via SUBCUTANEOUS

## 2014-12-09 NOTE — Progress Notes (Signed)
Hypoglycemic Event  CBG: 39  Treatment: 15 GM carbohydrate snack  Symptoms: None  Follow-up CBG: Time:0740 CBG Result:  Possible Reasons for Event: Unknown  Comments/MD notified:Spencer Kellie SimmeringSimon    Alaja Goldinger J  Remember to initiate Hypoglycemia Order Set & complete

## 2014-12-09 NOTE — Progress Notes (Signed)
D.  Pt anxious and labile on approach, positive for evening group, interacting appropriately with peers on unit.  Given Levemir and sandwich tray this evening due to brittle diabetes.  Pt is ordered a second consult.  Denies SI/HI/hallucinations at this time.  Irritable during phone conversations with frequent swearing and redirection by staff.  A.  Support and encouragement offered  R. Pt remains safe on unit, will continue to monitor.

## 2014-12-09 NOTE — Progress Notes (Addendum)
[facility] PROGRESS NOTE  ISEAH PLOUFF ZOX:[id number] DOB: Feb 14, 1981 DOA: 12/07/2014 PCP: Catalina Pizza, MD  Assessment/Plan: Active Problems:   Substance induced mood disorder - Psychiatry managing    Hyperglycemia without ketosis - Pt had some hypoglycemia this AM - Will decrease short acting and long acting insulin and continue to assess blood sugars  Hyponatremia - in setting of hyperglycemia - improving with improved blood sugars. - Suspect should continue to improve with improved oral intake  Addendum Hypokalemia - replace orally and reassess next am.  Code Status: full Family Communication: no family at bedside  Disposition Plan: Pending improvement in condition   Procedures:  None  Antibiotics:  None  HPI/Subjective: Pt states that he had not taken his long acting insulin within the last few days and as such had elevated blood sugar levels.  Objective: Filed Vitals:   12/09/14 1210  BP: 155/89  Pulse: 88  Temp:   Resp:    No intake or output data in the 24 hours ending 12/09/14 1326 Filed Weights   12/08/14 0230  Weight: 74.163 kg (163 lb 8 oz)    Exam:   General:  Pt in nad, alert and awake  Cardiovascular: rrr, no mrg  Respiratory: cta bl, no wheezes  Abdomen: soft, nt, nd  Musculoskeletal: no cyanosis or clubbing   Data Reviewed: Basic Metabolic Panel:  Recent Labs Lab 12/04/14 0439 12/05/14 0621 12/06/14 0621 12/07/14 2134 12/09/14 0715  NA 138 140 136 125* 132*  K 3.7 3.9 3.2* 4.6 3.3*  CL 107 108 101 93* 94*  CO2 GLUCOSE 106* 63* 202* 652* 171*  BUN 31* 23  CREATININE 0.76 0.57 0.64 0.89 0.80  CALCIUM 8.4 8.3* 8.5 8.5 8.7   Liver Function Tests:  Recent Labs Lab 12/03/14 1138 12/07/14 2134  AST 22 22  ALT 22 17  ALKPHOS 178* 136*  BILITOT 1.4* 0.4  PROT 7.4 7.0  ALBUMIN 3.3* 3.0*   No results for input(s): LIPASE, AMYLASE in the last 168 hours. No results for input(s):  AMMONIA in the last 168 hours. CBC:  Recent Labs Lab 12/03/14 1138 12/04/14 0439 12/05/14 0621 12/07/14 2134  WBC 16.3* 15.2* 9.1 10.1  NEUTROABS 13.3*  --   --   --   HGB 9.3* 9.5* 10.4* 10.3*  HCT 28.6* 29.5* 33.2* 33.1*  MCV 88.0 87.3 88.8 87.8  PLT 385 404* 478* 497*   Cardiac Enzymes: No results for input(s): CKTOTAL, CKMB, CKMBINDEX, TROPONINI in the last 168 hours. BNP (last 3 results) No results for input(s): PROBNP in the last 8760 hours. CBG:  Recent Labs Lab 12/08/14 1059 12/08/14 1615 12/08/14 2113 12/09/14 0626 12/09/14 0740  GLUCAP 337* 496* 170* 39* 196*    No results found for this or any previous visit (from the past 240 hour(s)).   Studies: No results found.  Scheduled Meds: . cloNIDine  0.1 mg Oral QID   Followed by  . [START ON 12/10/2014] cloNIDine  0.1 mg Oral BH-qamhs   Followed by  . [START ON 12/12/2014] cloNIDine  0.1 mg Oral QAC breakfast  . feeding supplement (GLUCERNA SHAKE)  237 mL Oral BID BM  . insulin aspart  0-20 Units Subcutaneous TID WC  . insulin aspart  0-5 Units Subcutaneous QHS  . insulin aspart  10 Units Subcutaneous TID WC  . insulin detemir  32 Units Subcutaneous QHS  . meloxicam  15 mg Oral Daily  . Milnacipran  50  mg Oral BID  . traZODone  50 mg Oral QHS,MR X 1   Continuous Infusions:    Time spent: > 35 minutes    Penny PiaVEGA, Shloimy Michalski  [facility] Pager [phone]. If 7PM-7AM, please contact night-coverage at [email or web address], password [email or web address] 12/09/2014, 1:26 PM  LOS: 2 days

## 2014-12-09 NOTE — Progress Notes (Signed)
Patient ID: Tyler FruitJohn E Mann, male   DOB: [birth year], 34 y.o.   MRN: [id number]  Adult Psychoeducational Group Note  Date:  12/09/2014 Time: 0905  Group Topic/Focus:  Goals Group:   The focus of this group is to help patients establish daily goals to achieve during treatment and discuss how the patient can incorporate goal setting into their daily lives to aide in recovery.  Participation Level:  Active  Participation Quality:  Appropriate  Affect:  Blunted  Cognitive:  Alert  Insight: Improving  Engagement in Group:  Defensive and Improving  Modes of Intervention:  Discussion, Education, Orientation and Support  Additional Comments: Pt actively participated in group. Pt able to identify a short term and long term goal.   Aurora Maskwyman, Christia Coaxum E 12/09/2014, 11:04 AM

## 2014-12-09 NOTE — Progress Notes (Signed)
Recreation Therapy Notes  Animal-Assisted Activity/Therapy (AAA/T) Program Checklist/Progress Notes Patient Eligibility Criteria Checklist & Daily Group note for Rec Tx Intervention  Date: 01.21.2016 Time: 2:45pm Location: [address] Dayroom   AAA/T Program Assumption of Risk Form signed by Patient/ or Parent Legal Guardian yes  Patient is free of allergies or sever asthma yes  Patient reports no fear of animals yes  Patient reports no history of cruelty to animals yes  Patient understands his/her participation is voluntary yes  Patient washes hands before animal contact yes  Patient washes hands after animal contact yes  Behavioral Response: Did not attend.   Carneshia Raker L Zaim Nitta, LRT/CTRS  Cortlyn Cannell L 12/09/2014 5:05 PM 

## 2014-12-09 NOTE — Progress Notes (Signed)
[facility] MD Progress Note  12/09/2014 5:39 PM Tyler Mann  MRN:  [id number] Subjective  Patient states he is feeling better. He is hoping to discharge soon. He states he  Had a visit from his parents yesterday and " it went well". Denies any thoughts of hurting his parents and states " whatever I might have said,I did when I was really pissed off and I did not mean it". He denies suicidal ideations as well. Denies withdrawal symptoms. Objective: Patient presents improved compared to admission. Less irritable and less dysphoric. Still tending to minimize drug abuse issues, but today states he does plan to stay sober, and states " I have been to meetings ( 12 step) before , maybe I'll go again". No disruptive behaviors on unit. Patient  had episode of hypoglycemia - appreciate hospitalist follow up / involvement.  Principal Problem: Depression, Substance Dependence  Diagnosis:   Patient Active Problem List   Diagnosis Date Noted  . Substance induced mood disorder [F19.94] 12/08/2014  . Hyperglycemia without ketosis [R73.9]   . Diabetes type 1, uncontrolled [E10.65]   . Suicidal ideation [R45.851] 12/05/2014    Class: Acute  . Hyperglycemia [R73.9] 12/03/2014  . Substance abuse [F19.10] 12/03/2014  . Hypokalemia [E87.6] 11/03/2014  . Leukocytosis [D72.829] 11/02/2014  . Hyperkalemia [E87.5] 11/02/2014  . Chronic back pain [M54.9, G89.29] 11/02/2014  . Hematemesis [K92.0] 11/02/2014  . Generalized anxiety disorder [F41.1] 11/02/2014  . DKA (diabetic ketoacidoses) [E13.10] 11/19/2013  . DKA, type 1 [E10.10] 11/22/2012  . Opiate dependence [F11.20] 11/22/2012  . Tobacco abuse [Z72.0] 11/22/2012  . Diabetic nephropathy [E11.21] 11/22/2012  . Diabetic ketoacidosis [E13.10] 09/20/2012  . Diabetes mellitus type 1 [E10.9] 09/20/2012  . Chronic pain [G89.29] 09/20/2012  . Hyperlipidemia [E78.5] 09/20/2012   Total Time spent with patient: 25 minutes    Past Medical History:  Past Medical  History  Diagnosis Date  . Diabetes mellitus   . Neuropathy   . Narcotic addiction     Hx IV heroin abuse  . Chronic back pain     Past Surgical History  Procedure Laterality Date  . Mouth surgery     Family History: History reviewed. No pertinent family history. Social History:  History  Alcohol Use No     History  Drug Use  . Yes  . Special: Methamphetamines, Heroin    Comment: recovering from heroin-     History   Social History  . Marital Status: Single    Spouse Name: N/A    Number of Children: N/A  . Years of Education: N/A   Social History Main Topics  . Smoking status: Current Every Day Smoker -- 1.00 packs/day for 17 years    Types: Cigarettes  . Smokeless tobacco: Current User  . Alcohol Use: No  . Drug Use: Yes    Special: Methamphetamines, Heroin     Comment: recovering from heroin-   . Sexual Activity: Yes   Other Topics Concern  . None   Social History Narrative   Additional History:    Sleep: improved  Appetite:  Fair   Assessment:   Musculoskeletal: Strength & Muscle Tone: within normal limits Gait & Station: normal Patient leans: N/A   Psychiatric Specialty Exam: Physical Exam  ROS  Blood pressure 155/89, pulse 88, temperature 97.3 F (36.3 C), temperature source Oral, resp. rate 16, height 6' (1.829 m), weight 163 lb 8 oz (74.163 kg), SpO2 99 %.Body mass index is 22.17 kg/(m^2).  General Appearance: Fairly Groomed  Eye  Contact::  Good  Speech:  Normal Rate  Volume:  Normal  Mood:  improved , less dysphoric, less irritable  Affect:  more reactive  Thought Process:  Goal Directed and Linear  Orientation:  Full (Time, Place, and Person)  Thought Content:  denies hallucinations, no delusions  Suicidal Thoughts:  No denies any thoughts of hurting self or anyone else, denies any thoughts of hurting parents  Homicidal Thoughts:  No  Memory: Recent and Remote grossly intact  Judgement:  Fair  Insight:  Fair  Psychomotor  Activity:  Normal  Concentration:  Good  Recall:  Good  Fund of Knowledge:Good  Language: Good  Akathisia:  Negative  Handed:  Right  AIMS (if indicated):     Assets:  Communication Skills Resilience Social Support  ADL's:  Fair   Cognition: WNL  Sleep:  Number of Hours: 6     Current Medications: Current Facility-Administered Medications  Medication Dose Route Frequency Provider Last Rate Last Dose  . acetaminophen (TYLENOL) tablet 650 mg  650 mg Oral Q6H PRN Laverle Hobby, PA-C      . ALPRAZolam Duanne Moron) tablet 0.5 mg  0.5 mg Oral TID PRN Laverle Hobby, PA-C   0.5 mg at 12/09/14 0837  . alum & mag hydroxide-simeth (MAALOX/MYLANTA) 200-200-20 MG/5ML suspension 30 mL  30 mL Oral Q4H PRN Laverle Hobby, PA-C      . cloNIDine (CATAPRES) tablet 0.1 mg  0.1 mg Oral QID Laverle Hobby, PA-C   0.1 mg at 12/09/14 1732   Followed by  . [START ON 12/10/2014] cloNIDine (CATAPRES) tablet 0.1 mg  0.1 mg Oral BH-qamhs Spencer E Simon, PA-C       Followed by  . [START ON 12/12/2014] cloNIDine (CATAPRES) tablet 0.1 mg  0.1 mg Oral QAC breakfast Laverle Hobby, PA-C      . cyclobenzaprine (FLEXERIL) tablet 10 mg  10 mg Oral TID PRN Laverle Hobby, PA-C   10 mg at 12/08/14 2222  . dicyclomine (BENTYL) tablet 20 mg  20 mg Oral Q6H PRN Laverle Hobby, PA-C      . feeding supplement (GLUCERNA SHAKE) (GLUCERNA SHAKE) liquid 237 mL  237 mL Oral BID BM Clayton Bibles, RD   237 mL at 12/09/14 1400  . hydrOXYzine (ATARAX/VISTARIL) tablet 25 mg  25 mg Oral Q6H PRN Laverle Hobby, PA-C      . insulin aspart (novoLOG) injection 0-20 Units  0-20 Units Subcutaneous TID WC Robbie Lis, MD   15 Units at 12/09/14 1218  . insulin aspart (novoLOG) injection 0-5 Units  0-5 Units Subcutaneous QHS Laverle Hobby, PA-C   0 Units at 12/08/14 2244  . insulin aspart (novoLOG) injection 10 Units  10 Units Subcutaneous TID WC Velvet Bathe, MD      . insulin detemir (LEVEMIR) injection 32 Units  32 Units Subcutaneous  QHS Velvet Bathe, MD      . loperamide (IMODIUM) capsule 2-4 mg  2-4 mg Oral PRN Laverle Hobby, PA-C      . magnesium hydroxide (MILK OF MAGNESIA) suspension 30 mL  30 mL Oral Daily PRN Laverle Hobby, PA-C      . meloxicam (MOBIC) tablet 15 mg  15 mg Oral Daily Laverle Hobby, PA-C   15 mg at 12/09/14 0834  . methocarbamol (ROBAXIN) tablet 500 mg  500 mg Oral Q8H PRN Laverle Hobby, PA-C      . Milnacipran (SAVELLA) tablet TABS 50 mg  50 mg  Oral BID Laverle Hobby, PA-C   50 mg at 12/09/14 1727  . naproxen (NAPROSYN) tablet 500 mg  500 mg Oral BID PRN Laverle Hobby, PA-C      . ondansetron (ZOFRAN-ODT) disintegrating tablet 4 mg  4 mg Oral Q6H PRN Laverle Hobby, PA-C      . traZODone (DESYREL) tablet 50 mg  50 mg Oral QHS,MR X 1 Spencer E Simon, PA-C   50 mg at 12/08/14 2243    Lab Results:  Results for orders placed or performed during the hospital encounter of 12/07/14 (from the past 48 hour(s))  Glucose, capillary     Status: Abnormal   Collection Time: 12/07/14  8:46 PM  Result Value Ref Range   Glucose-Capillary 579 (HH) 70 - 99 mg/dL  CBC     Status: Abnormal   Collection Time: 12/07/14  9:34 PM  Result Value Ref Range   WBC 10.1 4.0 - 10.5 K/uL   RBC 3.77 (L) 4.22 - 5.81 MIL/uL   Hemoglobin 10.3 (L) 13.0 - 17.0 g/dL   HCT 33.1 (L) 39.0 - 52.0 %   MCV 87.8 78.0 - 100.0 fL   MCH 27.3 26.0 - 34.0 pg   MCHC 31.1 30.0 - 36.0 g/dL   RDW 16.2 (H) 11.5 - 15.5 %   Platelets 497 (H) 150 - 400 K/uL  Comprehensive metabolic panel     Status: Abnormal   Collection Time: 12/07/14  9:34 PM  Result Value Ref Range   Sodium 125 (L) 135 - 145 mmol/L    Comment: Please note change in reference range.   Potassium 4.6 3.5 - 5.1 mmol/L    Comment: Please note change in reference range.   Chloride 93 (L) 96 - 112 mEq/L   CO2 25 19 - 32 mmol/L   Glucose, Bld 652 (HH) 70 - 99 mg/dL    Comment: REPEATED TO VERIFY CRITICAL RESULT CALLED TO, READ BACK BY AND VERIFIED WITH: T.DOSTER,RN  AT 2241 ON 12/07/14 BY SHEA.W    BUN 31 (H) 6 - 23 mg/dL   Creatinine, Ser 0.89 0.50 - 1.35 mg/dL   Calcium 8.5 8.4 - 10.5 mg/dL   Total Protein 7.0 6.0 - 8.3 g/dL   Albumin 3.0 (L) 3.5 - 5.2 g/dL   AST 22 0 - 37 U/L   ALT 17 0 - 53 U/L   Alkaline Phosphatase 136 (H) 39 - 117 U/L   Total Bilirubin 0.4 0.3 - 1.2 mg/dL   GFR calc non Af Amer >90 >90 mL/min   GFR calc Af Amer >90 >90 mL/min    Comment: (NOTE) The eGFR has been calculated using the CKD EPI equation. This calculation has not been validated in all clinical situations. eGFR's persistently <90 mL/min signify possible Chronic Kidney Disease.    Anion gap 7 5 - 15  CBG monitoring, ED     Status: Abnormal   Collection Time: 12/07/14  9:35 PM  Result Value Ref Range   Glucose-Capillary 588 (HH) 70 - 99 mg/dL   Comment 1 Notify RN   Blood gas, venous     Status: Abnormal   Collection Time: 12/07/14  9:43 PM  Result Value Ref Range   pH, Ven 7.392 (H) 7.250 - 7.300   pCO2, Ven 41.7 (L) 45.0 - 50.0 mmHg   pO2, Ven 43.3 30.0 - 45.0 mmHg   Bicarbonate 24.8 (H) 20.0 - 24.0 mEq/L   TCO2 22.9 0 - 100 mmol/L   Acid-Base Excess 0.4 0.0 -  2.0 mmol/L   O2 Saturation 77.5 %   Patient temperature 98.6    Collection site VEIN    Drawn by Vonda Antigua, PHLEBOTOMIST    Sample type VEIN   Urinalysis, Routine w reflex microscopic     Status: Abnormal   Collection Time: 12/07/14 10:02 PM  Result Value Ref Range   Color, Urine YELLOW YELLOW   APPearance CLEAR CLEAR   Specific Gravity, Urine 1.027 1.005 - 1.030   pH 6.5 5.0 - 8.0   Glucose, UA >1000 (A) NEGATIVE mg/dL   Hgb urine dipstick NEGATIVE NEGATIVE   Bilirubin Urine NEGATIVE NEGATIVE   Ketones, ur NEGATIVE NEGATIVE mg/dL   Protein, ur NEGATIVE NEGATIVE mg/dL   Urobilinogen, UA 0.2 0.0 - 1.0 mg/dL   Nitrite NEGATIVE NEGATIVE   Leukocytes, UA NEGATIVE NEGATIVE  Urine microscopic-add on     Status: None   Collection Time: 12/07/14 10:02 PM  Result Value Ref Range    Urine-Other      NO FORMED ELEMENTS SEEN ON URINE MICROSCOPIC EXAMINATION  CBG monitoring, ED     Status: Abnormal   Collection Time: 12/07/14 11:14 PM  Result Value Ref Range   Glucose-Capillary 525 (H) 70 - 99 mg/dL   Comment 1 Notify RN   POC CBG, ED     Status: Abnormal   Collection Time: 12/08/14 12:27 AM  Result Value Ref Range   Glucose-Capillary 304 (H) 70 - 99 mg/dL  POC CBG, ED     Status: Abnormal   Collection Time: 12/08/14  1:11 AM  Result Value Ref Range   Glucose-Capillary 202 (H) 70 - 99 mg/dL  Glucose, capillary     Status: Abnormal   Collection Time: 12/08/14  2:03 AM  Result Value Ref Range   Glucose-Capillary 139 (H) 70 - 99 mg/dL  Glucose, capillary     Status: Abnormal   Collection Time: 12/08/14  6:12 AM  Result Value Ref Range   Glucose-Capillary 258 (H) 70 - 99 mg/dL  Glucose, capillary     Status: Abnormal   Collection Time: 12/08/14 10:59 AM  Result Value Ref Range   Glucose-Capillary 337 (H) 70 - 99 mg/dL   Comment 1 Documented in Chart   Glucose, capillary     Status: Abnormal   Collection Time: 12/08/14  4:15 PM  Result Value Ref Range   Glucose-Capillary 496 (H) 70 - 99 mg/dL   Comment 1 Documented in Chart   Glucose, capillary     Status: Abnormal   Collection Time: 12/08/14  9:13 PM  Result Value Ref Range   Glucose-Capillary 170 (H) 70 - 99 mg/dL  Glucose, capillary     Status: Abnormal   Collection Time: 12/09/14  6:26 AM  Result Value Ref Range   Glucose-Capillary 39 (LL) 70 - 99 mg/dL   Comment 1 Notify RN   Hemoglobin A1c     Status: Abnormal   Collection Time: 12/09/14  7:15 AM  Result Value Ref Range   Hgb A1c MFr Bld 10.1 (H) <5.7 %    Comment: (NOTE)                                                                       According to the ADA Clinical  Practice Recommendations for 2011, when HbA1c is used as a screening test:  >=6.5%   Diagnostic of Diabetes Mellitus           (if abnormal result is confirmed) 5.7-6.4%    Increased risk of developing Diabetes Mellitus References:Diagnosis and Classification of Diabetes Mellitus,Diabetes YQMG,5003,70(WUGQB 1):S62-S69 and Standards of Medical Care in         Diabetes - 2011,Diabetes VQXI,5038,88 (Suppl 1):S11-S61.    Mean Plasma Glucose 243 (H) <117 mg/dL    Comment: Performed at [facility]  Hepatitis B surface antigen     Status: None   Collection Time: 12/09/14  7:15 AM  Result Value Ref Range   Hepatitis B Surface Ag NEGATIVE NEGATIVE    Comment: Performed at [facility]  Hepatitis C antibody     Status: None   Collection Time: 12/09/14  7:15 AM  Result Value Ref Range   HCV Ab NEGATIVE NEGATIVE    Comment: Performed at [facility] metabolic panel     Status: Abnormal   Collection Time: 12/09/14  7:15 AM  Result Value Ref Range   Sodium 132 (L) 135 - 145 mmol/L    Comment: Please note change in reference range.   Potassium 3.3 (L) 3.5 - 5.1 mmol/L    Comment: Please note change in reference range.   Chloride 94 (L) 96 - 112 mEq/L   CO2 27 19 - 32 mmol/L   Glucose, Bld 171 (H) 70 - 99 mg/dL   BUN 23 6 - 23 mg/dL   Creatinine, Ser 0.80 0.50 - 1.35 mg/dL   Calcium 8.7 8.4 - 10.5 mg/dL   GFR calc non Af Amer >90 >90 mL/min   GFR calc Af Amer >90 >90 mL/min    Comment: (NOTE) The eGFR has been calculated using the CKD EPI equation. This calculation has not been validated in all clinical situations. eGFR's persistently <90 mL/min signify possible Chronic Kidney Disease.    Anion gap 11 5 - 15    Comment: Performed at [facility]  Glucose, capillary     Status: Abnormal   Collection Time: 12/09/14  7:40 AM  Result Value Ref Range   Glucose-Capillary 196 (H) 70 - 99 mg/dL  Glucose, capillary     Status: Abnormal   Collection Time: 12/09/14 12:13 PM  Result Value Ref Range   Glucose-Capillary 343 (H) 70 - 99 mg/dL   Comment 1 Notify RN     Physical Findings: AIMS: Facial and Oral  Movements Muscles of Facial Expression: None, normal Lips and Perioral Area: None, normal Jaw: None, normal Tongue: None, normal,Extremity Movements Upper (arms, wrists, hands, fingers): None, normal Lower (legs, knees, ankles, toes): None, normal, Trunk Movements Neck, shoulders, hips: None, normal, Overall Severity Severity of abnormal movements (highest score from questions above): None, normal Incapacitation due to abnormal movements: None, normal Patient's awareness of abnormal movements (rate only patient's report): No Awareness, Dental Status Current problems with teeth and/or dentures?: Yes Does patient usually wear dentures?: No  CIWA:  CIWA-Ar Total: 0 COWS:  COWS Total Score: 2  Assessment Improving compared to admission- less dysphoric, less irritable, no disruptive behaviors on unit. Denies SI or HI. DM management being addressed /adjusted by Hospitalist . No current withdrawal symptoms Treatment Plan Summary: Daily contact with patient to assess and evaluate symptoms and progress in treatment, Medication management, Plan Continue inpatient treatment and continue  clonidine detox. Patient not interested in antidepressant management at this time.  Medical Decision Making:  Established Problem, Stable/Improving (1), Review or order clinical lab tests (1), Review of Last Therapy Session (1) and Review of Medication Regimen & Side Effects (2) Problem Points:  Established problem, stable/improving (1), Review of last therapy session (1) and Review of psycho-social stressors (1) Data Points:  Review of medication regiment & side effects (2)    COBOS, FERNANDO 12/09/2014, 5:39 PM

## 2014-12-09 NOTE — [facility] Suicide Risk Assessment (Signed)
[facility] INPATIENT:  Family/Significant Other Suicide Prevention Education  Suicide Prevention Education:  Education Completed; father Jackqulyn LivingsJohn Sebring [phone],  (name of family member/significant other) has been identified by the patient as the family member/significant other with whom the patient will be residing, and identified as the person(s) who will aid the patient in the event of a mental health crisis (suicidal ideations/suicide attempt).  With written consent from the patient, the family member/significant other has been provided the following suicide prevention education, prior to the and/or following the discharge of the patient.  The suicide prevention education provided includes the following:  Suicide risk factors  Suicide prevention and interventions  National Suicide Hotline telephone number  [facility] assessment telephone number  [facility] Emergency Assistance 911  [facility] and/or Residential Mobile Crisis Unit telephone number  Request made of family/significant other to:  Remove weapons (e.g., guns, rifles, knives), all items previously/currently identified as safety concern.    Remove drugs/medications (over-the-counter, prescriptions, illicit drugs), all items previously/currently identified as a safety concern.  The family member/significant other verbalizes understanding of the suicide prevention education information provided.  The family member/significant other agrees to remove the items of safety concern listed above.  Keaton Beichner, [place] CarboKristin L 12/09/2014, 11:09 AM

## 2014-12-09 NOTE — Progress Notes (Signed)
Pt given meal coverage of insulin tonight, CBG of 83. Provider consulted and insulin given right before patient ate dinner. Pt instructed to notify staff if signs of hypoglycemia occurs.

## 2014-12-09 NOTE — [facility] Group Notes (Signed)
[facility] LCSW Group Therapy 12/09/2014 1:15 PM Type of Therapy: Group Therapy Participation Level: Active  Participation Quality: Attentive  Affect: Lethargic, anxious  Cognitive: Alert and Oriented  Insight: Developing/Improving and Engaged  Engagement in Therapy: Developing/Improving and Engaged  Modes of Intervention: Activity, Clarification, Confrontation, Discussion, Education, Exploration, Limit-setting, Orientation, Problem-solving, Rapport Building, Dance movement psychotherapisteality Testing, Socialization and Support  Summary of Progress/Problems: Patient was attentive and engaged with speaker from [employer]. Patient was attentive to speaker while they shared their story of dealing with mental health and overcoming it. Patient expressed interest in their programs and services and received information on their agency. Patient processed ways they can relate to the speaker. Patient attended group and appeared to fluctuate between lethargic and anxious affect. Patient left group during music portion of presentation and did not return.  Tyler BruinKristin Lariya Mann, MSW, [employer]CSWA Clinical Social Worker [facility] [phone]

## 2014-12-09 NOTE — Progress Notes (Addendum)
Patient ID: Tyler Mann, male   DOB: 08-07-1981, 34 y.o.   MRN: [id number]  Pt currently presents with a blunted affect and irritable behavior. Per self inventory, pt rates depression, hopelessness and anxiety at a 0. Pt's daily goal is to "stay calm" and "think before speaking." Pt reports good sleep, concentration and appetite.  Pt provided with medications per providers orders. Pt's labs and vitals were monitored throughout the day. Pt supported emotionally and encouraged to express concerns and questions. Pt educated on medications and nutrition. Pt's safety ensured with 15 minute and environmental checks. Pt currently denies SI/HI and A/V hallucinations. Pt verbally agrees to seek staff if SI/HI or A/VH occurs and to consult with staff before acting on these thoughts. Hospitalist MD changed glycemic control medications today, see notes and orders.

## 2014-12-09 NOTE — Progress Notes (Signed)
Pt cbg was 39 this morning and the hypoglycemic protocal was initiated   Notified pa Donell SievertSpencer Simon   He agreed to hold the insulin and have pt eat breakfast then recheck at 0740 and report findings for further instructions

## 2014-12-10 DIAGNOSIS — E876 Hypokalemia: Secondary | ICD-10-CM

## 2014-12-10 DIAGNOSIS — F1920 Other psychoactive substance dependence, uncomplicated: Secondary | ICD-10-CM

## 2014-12-10 DIAGNOSIS — F329 Major depressive disorder, single episode, unspecified: Principal | ICD-10-CM

## 2014-12-10 LAB — HIV ANTIBODY (ROUTINE TESTING W REFLEX): HIV-1/HIV-2 Ab: NONREACTIVE

## 2014-12-10 LAB — GLUCOSE, CAPILLARY
GLUCOSE-CAPILLARY: 68 mg/dL — AB (ref 70–99)
GLUCOSE-CAPILLARY: 66 mg/dL — AB (ref 70–99)
GLUCOSE-CAPILLARY: 199 mg/dL — AB (ref 70–99)
GLUCOSE-CAPILLARY: 180 mg/dL — AB (ref 70–99)
Glucose-Capillary: 373 mg/dL — ABNORMAL HIGH (ref 70–99)
Glucose-Capillary: 151 mg/dL — ABNORMAL HIGH (ref 70–99)

## 2014-12-10 LAB — HEMOGLOBIN A1C
HEMOGLOBIN A1C: 10.4 % — AB (ref <5.7)
MEAN PLASMA GLUCOSE: 252 mg/dL — AB (ref <117)

## 2014-12-10 MED ORDER — INSULIN DETEMIR 100 UNIT/ML ~~LOC~~ SOLN: 30.0000 [IU] | Freq: Every day | SUBCUTANEOUS | Status: DC

## 2014-12-10 MED ORDER — INSULIN DETEMIR 100 UNIT/ML ~~LOC~~ SOLN
29.0000 [IU] | Freq: Every day | SUBCUTANEOUS | Status: DC
  Administered 2014-12-10 – 2014-12-12 (×3): 29 [IU] via SUBCUTANEOUS

## 2014-12-10 NOTE — Progress Notes (Signed)
D: Pt denies SI/HI/AVH. Pt is pleasant and cooperative. Pt focused on Xanax. Pt refused Trazodone due to nightmares.  A: Pt was offered support and encouragement. Pt was given scheduled medications. Pt was encourage to attend groups. Q 15 minute checks were done for safety. Tried to educate on long term effects of xanax, but pt did not want to listen.   R:Pt attends groups and interacts well with peers and staff. Pt is taking medication. Pt has no complaints at this time .Pt receptive to treatment and safety maintained on unit.

## 2014-12-10 NOTE — Progress Notes (Signed)
[facility] Group Notes:  (Nursing/MHT/Case Management/Adjunct)  Date:  12/10/2014  Time:  9:29 PM  Type of Therapy:  Psychoeducational Skills  Participation Level:  Minimal  Participation Quality:  Resistant  Affect:  Excited  Cognitive:  Appropriate  Insight:  Limited  Engagement in Group:  Limited and Resistant  Modes of Intervention:  Education  Summary of Progress/Problems: The patient described his day as having been "okay", but would not offer any additional details. As a theme for the day, his coping is to "take care of me first". He states that he may sound rude by focusing on himself, but feels that he needs to work on this in the future.   Hazle CocaGOODMAN, Sair Faulcon S 12/10/2014, 9:29 PM

## 2014-12-10 NOTE — Plan of Care (Signed)
Problem: Alteration in mood; excessive anxiety as evidenced by: Goal: STG-Patient can identify triggers for anxiety Outcome: Completed/Met Date Met:  12/10/14 Pt able to work with nurse for two days in a row

## 2014-12-10 NOTE — Tx Team (Signed)
Interdisciplinary Treatment Plan Update   Date Reviewed:  12/10/2014  Time Reviewed:  9:53 AM  Progress in Treatment:   Attending groups: Yes Participating in groups: Yes Taking medication as prescribed: Yes  Tolerating medication: Yes Family/Significant other contact made:  Yes, collateral contact with family. Patient understands diagnosis: Yes  Discussing patient identified problems/goals with staff: Yes Medical problems stabilized or resolved: Yes Denies suicidal/homicidal ideation: Yes Patient has not harmed self or others: Yes  For review of initial/current patient goals, please see plan of care.  Estimated Length of Stay:  2-3 days  Reasons for Continued Hospitalization:  Anxiety Depression Medication stabilization   New Problems/Goals identified:    Discharge Plan or Barriers:   Home with outpatient follow up to be determined  Additional Comments:  Patient and CSW reviewed patient's identified goals and treatment plan.  Patient verbalized understanding and agreed to treatment plan.   Attendees:  Patient:  12/10/2014 9:53 AM   Signature:  Sallyanne HaversF. Cobos, MD 12/10/2014 9:53 AM  Signature:  12/10/2014 9:53 AM  Signature:  Wilford CornerPatrice Whitwe, RN 12/10/2014 9:53 AM  Signature:  Rolla PlateAngela Troxler, RN 12/10/2014 9:53 AM  Signature:  Kathi SimpersSarah Twyman, RN 12/10/2014 9:53 AM  Signature:  Juline PatchQuylle Brenen Beigel, LCSW 12/10/2014 9:53 AM   12/10/2014 9:53 AM   12/10/2014 9:53 AM   12/10/2014 9:53 AM   12/10/2014  9:53 AM   12/10/2014  9:53 AM   12/10/2014  9:53 AM    Scribe for Treatment Team:   Juline PatchQuylle Camden Mazzaferro,  12/10/2014 9:53 AM x

## 2014-12-10 NOTE — Progress Notes (Signed)
[facility] PROGRESS NOTE  Tyler Mann HYQ:[id number] DOB: 08/07/1981 DOA: 12/07/2014 PCP: Catalina PizzaHALL, ZACH, MD  Assessment/Plan: Active Problems:   Substance induced mood disorder - Psychiatry managing    Hyperglycemia without ketosis - resolved  Hypoglycemia - Will decrease Long acting insulin by 10 percent. Will place on 29 units of Levemir - Monitor blood sugars.  Hyponatremia - in setting of hyperglycemia - improving with improved blood sugars. - resolved with improved oral intake and improved blood sugar control  Hypokalemia - resolved after oral intake  Code Status: full Family Communication: no family at bedside  Disposition Plan: Pending improvement in condition   Procedures:  None  Antibiotics:  None  HPI/Subjective: No acute issues reported to me overnight.  Objective: Filed Vitals:   12/10/14 0550  BP: 132/81  Pulse: 96  Temp: 97.5 F (36.4 C)  Resp: 16   No intake or output data in the 24 hours ending 12/10/14 0924 Filed Weights   12/08/14 0230  Weight: 74.163 kg (163 lb 8 oz)    Exam:   Vital signs and lab work reviewed.  Data Reviewed: Basic Metabolic Panel:  Recent Labs Lab 12/05/14 0621 12/06/14 0621 12/07/14 2134 12/09/14 0715 12/09/14 1915  NA 140 136 125* 132* 136  K 3.9 3.2* 4.6 3.3* 4.3  CL 108 101 93* 94* 99  CO2 27 27 25 27 27   GLUCOSE 63* 202* 652* 171* 64*  BUN 12 11 31* 23 23  CREATININE 0.57 0.64 0.89 0.80 0.86  CALCIUM 8.3* 8.5 8.5 8.7 8.7   Liver Function Tests:  Recent Labs Lab 12/03/14 1138 12/07/14 2134  AST 22 22  ALT 22 17  ALKPHOS 178* 136*  BILITOT 1.4* 0.4  PROT 7.4 7.0  ALBUMIN 3.3* 3.0*   No results for input(s): LIPASE, AMYLASE in the last 168 hours. No results for input(s): AMMONIA in the last 168 hours. CBC:  Recent Labs Lab 12/03/14 1138 12/04/14 0439 12/05/14 0621 12/07/14 2134  WBC 16.3* 15.2* 9.1 10.1  NEUTROABS 13.3*  --   --   --   HGB 9.3* 9.5* 10.4* 10.3*   HCT 28.6* 29.5* 33.2* 33.1*  MCV 88.0 87.3 88.8 87.8  PLT 385 404* 478* 497*   Cardiac Enzymes: No results for input(s): CKTOTAL, CKMB, CKMBINDEX, TROPONINI in the last 168 hours. BNP (last 3 results) No results for input(s): PROBNP in the last 8760 hours. CBG:  Recent Labs Lab 12/09/14 1722 12/09/14 2133 12/10/14 0610 12/10/14 0637 12/10/14 0745  GLUCAP 83 168* 68* 66* 199*    No results found for this or any previous visit (from the past 240 hour(s)).   Studies: No results found.  Scheduled Meds: . cloNIDine  0.1 mg Oral BH-qamhs   Followed by  . [START ON 12/12/2014] cloNIDine  0.1 mg Oral QAC breakfast  . feeding supplement (GLUCERNA SHAKE)  237 mL Oral BID BM  . insulin aspart  0-20 Units Subcutaneous TID WC  . insulin aspart  0-5 Units Subcutaneous QHS  . insulin aspart  10 Units Subcutaneous TID WC  . insulin detemir  29 Units Subcutaneous QHS  . meloxicam  15 mg Oral Daily  . Milnacipran  50 mg Oral BID  . traZODone  50 mg Oral QHS,MR X 1   Continuous Infusions:    Time spent: 15 minutes    Penny PiaVEGA, [place]  [facility] Pager [phone]. If 7PM-7AM, please contact night-coverage at [email or web address], password [email or web address] 12/10/2014, 9:24 AM  LOS: 3 days

## 2014-12-10 NOTE — Plan of Care (Signed)
Problem: Alteration in mood & ability to function due to Goal: LTG-Pt verbalizes understanding of importance of med regimen (Patient verbalizes understanding of importance of medication regimen and need to continue outpatient care and support groups)  Outcome: Completed/Met Date Met:  12/10/14 Pt understand and takes an active role in his medication therapy

## 2014-12-10 NOTE — Progress Notes (Signed)
Patient ID: Tyler Mann, male   DOB: 1980-12-04, 34 y.o.   MRN: [id number] [facility] MD Progress Note  12/10/2014 4:04 PM Tyler Mann  MRN:  [id number] Subjective  Patient states he is feeling better. He is hoping to discharge soon.He denies suicidal ideations as well. Denies withdrawal symptoms. Objective: Patient presents improved compared to admission. Less irritable and less dysphoric.  He states that he just wants to go home and he sees no reason to be here because he is better.  He wanted his parents called to pick him up.   Still tending to minimize drug abuse issues, but today states he does plan to stay sober. No disruptive behaviors on unit. Patient  had episode of hypoglycemia yesterday but there were no reports from nursing of critical blood sugar on the unit.    Principal Problem: Depression, Substance Dependence  Diagnosis:   Patient Active Problem List   Diagnosis Date Noted  . Substance induced mood disorder [F19.94] 12/08/2014  . Hyperglycemia without ketosis [R73.9]   . Diabetes type 1, uncontrolled [E10.65]   . Suicidal ideation [R45.851] 12/05/2014    Class: Acute  . Hyperglycemia [R73.9] 12/03/2014  . Substance abuse [F19.10] 12/03/2014  . Hypokalemia [E87.6] 11/03/2014  . Leukocytosis [D72.829] 11/02/2014  . Hyperkalemia [E87.5] 11/02/2014  . Chronic back pain [M54.9, G89.29] 11/02/2014  . Hematemesis [K92.0] 11/02/2014  . Generalized anxiety disorder [F41.1] 11/02/2014  . DKA (diabetic ketoacidoses) [E13.10] 11/19/2013  . DKA, type 1 [E10.10] 11/22/2012  . Opiate dependence [F11.20] 11/22/2012  . Tobacco abuse [Z72.0] 11/22/2012  . Diabetic nephropathy [E11.21] 11/22/2012  . Diabetic ketoacidosis [E13.10] 09/20/2012  . Diabetes mellitus type 1 [E10.9] 09/20/2012  . Chronic pain [G89.29] 09/20/2012  . Hyperlipidemia [E78.5] 09/20/2012   Total Time spent with patient: 25 minutes    Past Medical History:  Past Medical History  Diagnosis Date  .  Diabetes mellitus   . Neuropathy   . Narcotic addiction     Hx IV heroin abuse  . Chronic back pain     Past Surgical History  Procedure Laterality Date  . Mouth surgery     Family History: History reviewed. No pertinent family history. Social History:  History  Alcohol Use No     History  Drug Use  . Yes  . Special: Methamphetamines, Heroin    Comment: recovering from heroin-     History   Social History  . Marital Status: Single    Spouse Name: N/A    Number of Children: N/A  . Years of Education: N/A   Social History Main Topics  . Smoking status: Current Every Day Smoker -- 1.00 packs/day for 17 years    Types: Cigarettes  . Smokeless tobacco: Current User  . Alcohol Use: No  . Drug Use: Yes    Special: Methamphetamines, Heroin     Comment: recovering from heroin-   . Sexual Activity: Yes   Other Topics Concern  . None   Social History Narrative   Additional History:    Sleep: improved  Appetite:  Fair   Assessment:   Musculoskeletal: Strength & Muscle Tone: within normal limits Gait & Station: normal Patient leans: N/A   Psychiatric Specialty Exam: Physical Exam  ROS  Blood pressure 132/81, pulse 96, temperature 97.5 F (36.4 C), temperature source Oral, resp. rate 16, height 6' (1.829 m), weight 74.163 kg (163 lb 8 oz), SpO2 99 %.Body mass index is 22.17 kg/(m^2).  General Appearance: Fairly Groomed  Engineer, water::  Good  Speech:  Normal Rate  Volume:  Normal  Mood:  improved , less dysphoric, less irritable  Affect:  more reactive  Thought Process:  Goal Directed and Linear  Orientation:  Full (Time, Place, and Person)  Thought Content:  denies hallucinations, no delusions  Suicidal Thoughts:  No denies any thoughts of hurting self or anyone else, denies any thoughts of hurting parents  Homicidal Thoughts:  No  Memory: Recent and Remote grossly intact  Judgement:  Fair  Insight:  Fair  Psychomotor Activity:  Normal  Concentration:   Good  Recall:  Good  Fund of Knowledge:Good  Language: Good  Akathisia:  Negative  Handed:  Right  AIMS (if indicated):     Assets:  Communication Skills Resilience Social Support  ADL's:  Fair   Cognition: WNL  Sleep:  Number of Hours: 6     Current Medications: Current Facility-Administered Medications  Medication Dose Route Frequency Provider Last Rate Last Dose  . acetaminophen (TYLENOL) tablet 650 mg  650 mg Oral Q6H PRN Laverle Hobby, PA-C      . ALPRAZolam Duanne Moron) tablet 0.5 mg  0.5 mg Oral TID PRN Laverle Hobby, PA-C   0.5 mg at 12/10/14 1525  . alum & mag hydroxide-simeth (MAALOX/MYLANTA) 200-200-20 MG/5ML suspension 30 mL  30 mL Oral Q4H PRN Laverle Hobby, PA-C      . cloNIDine (CATAPRES) tablet 0.1 mg  0.1 mg Oral BH-qamhs Spencer E Simon, PA-C   0.1 mg at 12/10/14 0758   Followed by  . [START ON 12/12/2014] cloNIDine (CATAPRES) tablet 0.1 mg  0.1 mg Oral QAC breakfast Laverle Hobby, PA-C      . cyclobenzaprine (FLEXERIL) tablet 10 mg  10 mg Oral TID PRN Laverle Hobby, PA-C   10 mg at 12/09/14 2158  . dicyclomine (BENTYL) tablet 20 mg  20 mg Oral Q6H PRN Laverle Hobby, PA-C      . feeding supplement (GLUCERNA SHAKE) (GLUCERNA SHAKE) liquid 237 mL  237 mL Oral BID BM Clayton Bibles, RD   237 mL at 12/10/14 1418  . hydrOXYzine (ATARAX/VISTARIL) tablet 25 mg  25 mg Oral Q6H PRN Laverle Hobby, PA-C      . insulin aspart (novoLOG) injection 0-20 Units  0-20 Units Subcutaneous TID WC Robbie Lis, MD   20 Units at 12/10/14 1211  . insulin aspart (novoLOG) injection 0-5 Units  0-5 Units Subcutaneous QHS Laverle Hobby, PA-C   0 Units at 12/08/14 2244  . insulin aspart (novoLOG) injection 10 Units  10 Units Subcutaneous TID WC Velvet Bathe, MD   10 Units at 12/09/14 1743  . insulin detemir (LEVEMIR) injection 29 Units  29 Units Subcutaneous QHS Velvet Bathe, MD      . loperamide (IMODIUM) capsule 2-4 mg  2-4 mg Oral PRN Laverle Hobby, PA-C      . magnesium  hydroxide (MILK OF MAGNESIA) suspension 30 mL  30 mL Oral Daily PRN Laverle Hobby, PA-C      . meloxicam (MOBIC) tablet 15 mg  15 mg Oral Daily Laverle Hobby, PA-C   15 mg at 12/10/14 0758  . methocarbamol (ROBAXIN) tablet 500 mg  500 mg Oral Q8H PRN Laverle Hobby, PA-C      . Milnacipran (SAVELLA) tablet TABS 50 mg  50 mg Oral BID Laverle Hobby, PA-C   50 mg at 12/10/14 0758  . naproxen (NAPROSYN) tablet 500 mg  500 mg Oral BID PRN  Laverle Hobby, PA-C   500 mg at 12/09/14 2158  . ondansetron (ZOFRAN-ODT) disintegrating tablet 4 mg  4 mg Oral Q6H PRN Laverle Hobby, PA-C      . traZODone (DESYREL) tablet 50 mg  50 mg Oral QHS,MR X 1 Laverle Hobby, PA-C   50 mg at 12/08/14 2243    Lab Results:  Results for orders placed or performed during the hospital encounter of 12/07/14 (from the past 48 hour(s))  Glucose, capillary     Status: Abnormal   Collection Time: 12/08/14  4:15 PM  Result Value Ref Range   Glucose-Capillary 496 (H) 70 - 99 mg/dL   Comment 1 Documented in Chart   Glucose, capillary     Status: Abnormal   Collection Time: 12/08/14  9:13 PM  Result Value Ref Range   Glucose-Capillary 170 (H) 70 - 99 mg/dL  Glucose, capillary     Status: Abnormal   Collection Time: 12/09/14  6:26 AM  Result Value Ref Range   Glucose-Capillary 39 (LL) 70 - 99 mg/dL   Comment 1 Notify RN   Hemoglobin A1c     Status: Abnormal   Collection Time: 12/09/14  7:15 AM  Result Value Ref Range   Hgb A1c MFr Bld 10.1 (H) <5.7 %    Comment: (NOTE)                                                                       According to the ADA Clinical Practice Recommendations for 2011, when HbA1c is used as a screening test:  >=6.5%   Diagnostic of Diabetes Mellitus           (if abnormal result is confirmed) 5.7-6.4%   Increased risk of developing Diabetes Mellitus References:Diagnosis and Classification of Diabetes Mellitus,Diabetes IBBC,4888,91(QXIHW 1):S62-S69 and Standards of Medical Care  in         Diabetes - 2011,Diabetes Care,2011,34 (Suppl 1):S11-S61.    Mean Plasma Glucose 243 (H) <117 mg/dL    Comment: Performed at [facility]  HIV antibody     Status: None   Collection Time: 12/09/14  7:15 AM  Result Value Ref Range   HIV 1/O/2 Abs-Index Value <1.00 <1.00    Comment: Index Value: Specimen reactivity relative to the negative cutoff.   HIV-1/HIV-2 Ab Non Reactive Non Reactive    Comment: (NOTE) Performed At: [facility] [place], [place] [id number] Lindon Romp MD KJ:[id number] Performed at [facility]   Hepatitis B surface antigen     Status: None   Collection Time: 12/09/14  7:15 AM  Result Value Ref Range   Hepatitis B Surface Ag NEGATIVE NEGATIVE    Comment: Performed at [facility]  Hepatitis C antibody     Status: None   Collection Time: 12/09/14  7:15 AM  Result Value Ref Range   HCV Ab NEGATIVE NEGATIVE    Comment: Performed at [place] metabolic panel     Status: Abnormal   Collection Time: 12/09/14  7:15 AM  Result Value Ref Range   Sodium 132 (L) 135 - 145 mmol/L    Comment: Please note change in reference range.   Potassium 3.3 (L) 3.5 -  5.1 mmol/L    Comment: Please note change in reference range.   Chloride 94 (L) 96 - 112 mEq/L   CO2 27 19 - 32 mmol/L   Glucose, Bld 171 (H) 70 - 99 mg/dL   BUN 23 6 - 23 mg/dL   Creatinine, Ser 0.80 0.50 - 1.35 mg/dL   Calcium 8.7 8.4 - 10.5 mg/dL   GFR calc non Af Amer >90 >90 mL/min   GFR calc Af Amer >90 >90 mL/min    Comment: (NOTE) The eGFR has been calculated using the CKD EPI equation. This calculation has not been validated in all clinical situations. eGFR's persistently <90 mL/min signify possible Chronic Kidney Disease.    Anion gap 11 5 - 15    Comment: Performed at [facility]  Glucose, capillary     Status: Abnormal   Collection Time: 12/09/14  7:40 AM  Result Value Ref Range    Glucose-Capillary 196 (H) 70 - 99 mg/dL  Glucose, capillary     Status: Abnormal   Collection Time: 12/09/14 12:13 PM  Result Value Ref Range   Glucose-Capillary 343 (H) 70 - 99 mg/dL   Comment 1 Notify RN   Glucose, capillary     Status: None   Collection Time: 12/09/14  5:22 PM  Result Value Ref Range   Glucose-Capillary 83 70 - 99 mg/dL   Comment 1 Notify RN   Hemoglobin A1c     Status: Abnormal   Collection Time: 12/09/14  7:15 PM  Result Value Ref Range   Hgb A1c MFr Bld 10.4 (H) <5.7 %    Comment: (NOTE)                                                                       According to the ADA Clinical Practice Recommendations for 2011, when HbA1c is used as a screening test:  >=6.5%   Diagnostic of Diabetes Mellitus           (if abnormal result is confirmed) 5.7-6.4%   Increased risk of developing Diabetes Mellitus References:Diagnosis and Classification of Diabetes Mellitus,Diabetes MVHQ,4696,29(BMWUX 1):S62-S69 and Standards of Medical Care in         Diabetes - 2011,Diabetes Care,2011,34 (Suppl 1):S11-S61.    Mean Plasma Glucose 252 (H) <117 mg/dL    Comment: Performed at [facility] metabolic panel     Status: Abnormal   Collection Time: 12/09/14  7:15 PM  Result Value Ref Range   Sodium 136 135 - 145 mmol/L    Comment: Please note change in reference range.   Potassium 4.3 3.5 - 5.1 mmol/L    Comment: Please note change in reference range. DELTA CHECK NOTED REPEATED TO VERIFY    Chloride 99 96 - 112 mEq/L   CO2 27 19 - 32 mmol/L   Glucose, Bld 64 (L) 70 - 99 mg/dL   BUN 23 6 - 23 mg/dL   Creatinine, Ser 0.86 0.50 - 1.35 mg/dL   Calcium 8.7 8.4 - 10.5 mg/dL   GFR calc non Af Amer >90 >90 mL/min   GFR calc Af Amer >90 >90 mL/min    Comment: (NOTE) The eGFR has been calculated using the CKD EPI equation. This calculation has not  been validated in all clinical situations. eGFR's persistently <90 mL/min signify possible Chronic  Kidney Disease.    Anion gap 10 5 - 15    Comment: Performed at [facility]  Glucose, capillary     Status: Abnormal   Collection Time: 12/09/14  9:33 PM  Result Value Ref Range   Glucose-Capillary 168 (H) 70 - 99 mg/dL  Glucose, capillary     Status: Abnormal   Collection Time: 12/10/14  6:10 AM  Result Value Ref Range   Glucose-Capillary 68 (L) 70 - 99 mg/dL  Glucose, capillary     Status: Abnormal   Collection Time: 12/10/14  6:37 AM  Result Value Ref Range   Glucose-Capillary 66 (L) 70 - 99 mg/dL  Glucose, capillary     Status: Abnormal   Collection Time: 12/10/14  7:45 AM  Result Value Ref Range   Glucose-Capillary 199 (H) 70 - 99 mg/dL  Glucose, capillary     Status: Abnormal   Collection Time: 12/10/14 12:05 PM  Result Value Ref Range   Glucose-Capillary 373 (H) 70 - 99 mg/dL   Comment 1 Notify RN     Physical Findings: AIMS: Facial and Oral Movements Muscles of Facial Expression: None, normal Lips and Perioral Area: None, normal Jaw: None, normal Tongue: None, normal,Extremity Movements Upper (arms, wrists, hands, fingers): None, normal Lower (legs, knees, ankles, toes): None, normal, Trunk Movements Neck, shoulders, hips: None, normal, Overall Severity Severity of abnormal movements (highest score from questions above): None, normal Incapacitation due to abnormal movements: None, normal Patient's awareness of abnormal movements (rate only patient's report): No Awareness, Dental Status Current problems with teeth and/or dentures?: Yes Does patient usually wear dentures?: No  CIWA:  CIWA-Ar Total: 5 COWS:  COWS Total Score: 4  Assessment Improving compared to admission- less dysphoric, less irritable, no disruptive behaviors on unit. Denies SI or HI. DM management being addressed /adjusted by Hospitalist . No current withdrawal symptoms Treatment Plan Summary: Daily contact with patient to assess and evaluate symptoms and progress in  treatment, Medication management, Plan Continue inpatient treatment and continue  clonidine detox. Patient not interested in antidepressant management at this time.    Medical Decision Making:  Established Problem, Stable/Improving (1), Review or order clinical lab tests (1), Review of Last Therapy Session (1) and Review of Medication Regimen & Side Effects (2) Problem Points:  Established problem, stable/improving (1), Review of last therapy session (1) and Review of psycho-social stressors (1) Data Points:  Review of medication regiment & side effects (2)    Cyndal Kasson MAY, AGNP-BC 12/10/2014, 4:04 PM

## 2014-12-10 NOTE — Progress Notes (Addendum)
[facility]  12/10/2014 8:45 AM  Participation Quality: Alert, Appropriate and Oriented  Mood/Affect: Irritable   Depression Rating: 0  Anxiety Rating: 0  Thoughts of Suicide: Pt denies SI/HI  Will you contract for safety? Yes  Current AVH: Pt denies  Plan for Discharge/Comments: Pt attended discharge planning group and actively participated in group. Pt presented as irritable, but reported feeling "great", "zeros across the board."   Transportation Means: Pt reports access to transportation  Supports: No supports mentioned at this time  Chad CordialLauren Carter, Theresia MajorsLCSWA 12/10/2014 9:33 AM

## 2014-12-10 NOTE — Plan of Care (Signed)
Problem: Diagnosis: Increased Risk For Suicide Attempt Goal: LTG-Patient Will Report Improved Mood and Deny Suicidal LTG (by discharge) Patient will report improved mood and deny suicidal ideation.  Outcome: Progressing Decrease in patients irritability throughout the day. Pt reports "I'm glad you guys are listening to me."

## 2014-12-10 NOTE — Progress Notes (Signed)
Patient ID: Tyler Mann, male   DOB: [birth year], 34 y.o.   MRN: [id number]  Pt currently presents with a flat affect and irritable behavior. Per self inventory, pt rates depression at a 0, hopelessness 0 and anxiety 0. Pt's daily goal is to "be positive" and they intend to do so by "think happy thoughts." Pt reports good sleep, concentration and appetite.  Pt provided with medications per providers orders. Pt's labs and vitals were monitored throughout the day. Pt supported emotionally and encouraged to express concerns and questions. Pt educated on medications and stress managment. Pt's safety ensured with 15 minute and environmental checks. Pt currently denies SI/HI and A/V hallucinations. Pt verbally agrees to seek staff if SI/HI or A/VH occurs and to consult with staff before acting on these thoughts.

## 2014-12-11 ENCOUNTER — Encounter (HOSPITAL_COMMUNITY): Payer: Self-pay | Admitting: Registered Nurse

## 2014-12-11 DIAGNOSIS — E162 Hypoglycemia, unspecified: Secondary | ICD-10-CM

## 2014-12-11 LAB — GLUCOSE, CAPILLARY
GLUCOSE-CAPILLARY: 231 mg/dL — AB (ref 70–99)
Glucose-Capillary: 107 mg/dL — ABNORMAL HIGH (ref 70–99)
Glucose-Capillary: 272 mg/dL — ABNORMAL HIGH (ref 70–99)
Glucose-Capillary: 285 mg/dL — ABNORMAL HIGH (ref 70–99)
Glucose-Capillary: 170 mg/dL — ABNORMAL HIGH (ref 70–99)

## 2014-12-11 NOTE — [facility] Group Notes (Signed)
Coping skills  Date:  12/11/2014  Time:  10:00  Type of Therapy:  Nurse Education  Participation Level:  Did Not Attend  Participation Quality:  Intrusive and Inattentive  Affect:  Blunted  Cognitive:  Lacking  Insight:  None  Engagement in Group:  None  Modes of Intervention:  Discussion  Summary of Progress/Problems: Pt remained in the bed and did not attend group Rodman KeyWebb, Chike Farrington [facility] HsptlGuyes 12/11/2014, 11:10 AM

## 2014-12-11 NOTE — Progress Notes (Signed)
[facility] PROGRESS NOTE  Paulo FruitJohn E Stokely ION:[id number]:[id number] DOB: 14-Jun-1981 DOA: 12/07/2014 PCP: Catalina PizzaHALL, ZACH, MD  Assessment/Plan: Active Problems:   Substance induced mood disorder - Psychiatry managing    Hyperglycemia without ketosis - Patient has brittle diabetes. On last check his blood sugars have fluctuated from 107-272.  - Would continue current diabetic regimen.  Hypoglycemia - Pt has not been hypoglycemic for > 24 hours. Would continue current insulin regimen  Hyponatremia - resolved  Hypokalemia - resolved after improved oral intake  Will sign off. Please call or reconsult should any other questions arise.  Code Status: full Family Communication: no family at bedside  Disposition Plan: Pending improvement in condition   Procedures:  None  Antibiotics:  None  HPI/Subjective: No acute issues reported to me overnight.  Objective: Filed Vitals:   12/11/14 0755  BP: 133/76  Pulse:   Temp:   Resp:    No intake or output data in the 24 hours ending 12/11/14 1034 Filed Weights   12/08/14 0230  Weight: 74.163 kg (163 lb 8 oz)    Exam:   Vital signs and lab work reviewed.  Data Reviewed: Basic Metabolic Panel:  Recent Labs Lab 12/05/14 0621 12/06/14 0621 12/07/14 2134 12/09/14 0715 12/09/14 1915  NA 140 136 125* 132* 136  K 3.9 3.2* 4.6 3.3* 4.3  CL 108 101 93* 94* 99  CO2 27 27 25 27 27   GLUCOSE 63* 202* 652* 171* 64*  BUN 12 11 31* 23 23  CREATININE 0.57 0.64 0.89 0.80 0.86  CALCIUM 8.3* 8.5 8.5 8.7 8.7   Liver Function Tests:  Recent Labs Lab 12/07/14 2134  AST 22  ALT 17  ALKPHOS 136*  BILITOT 0.4  PROT 7.0  ALBUMIN 3.0*   No results for input(s): LIPASE, AMYLASE in the last 168 hours. No results for input(s): AMMONIA in the last 168 hours. CBC:  Recent Labs Lab 12/05/14 0621 12/07/14 2134  WBC 9.1 10.1  HGB 10.4* 10.3*  HCT 33.2* 33.1*  MCV 88.8 87.8  PLT 478* 497*   Cardiac Enzymes: No results for  input(s): CKTOTAL, CKMB, CKMBINDEX, TROPONINI in the last 168 hours. BNP (last 3 results) No results for input(s): PROBNP in the last 8760 hours. CBG:  Recent Labs Lab 12/10/14 1205 12/10/14 1654 12/10/14 2102 12/11/14 0558 12/11/14 0747  GLUCAP 373* 151* 180* 107* 272*    No results found for this or any previous visit (from the past 240 hour(s)).   Studies: No results found.  Scheduled Meds: . cloNIDine  0.1 mg Oral BH-qamhs   Followed by  . [START ON 12/12/2014] cloNIDine  0.1 mg Oral QAC breakfast  . feeding supplement (GLUCERNA SHAKE)  237 mL Oral BID BM  . insulin aspart  0-20 Units Subcutaneous TID WC  . insulin aspart  0-5 Units Subcutaneous QHS  . insulin aspart  10 Units Subcutaneous TID WC  . insulin detemir  29 Units Subcutaneous QHS  . meloxicam  15 mg Oral Daily  . Milnacipran  50 mg Oral BID  . traZODone  50 mg Oral QHS,MR X 1   Continuous Infusions:    Time spent: 15 minutes    Penny PiaVEGA, Synai Prettyman  [facility] Pager [phone]. If 7PM-7AM, please contact night-coverage at [email or web address], password [email or web address] 12/11/2014, 10:34 AM  LOS: 4 days

## 2014-12-11 NOTE — Progress Notes (Addendum)
Pt has been irritable this am and during the day. He has remained in bed most of the day c/o feeling tired. Pt did go to the gym today. He denies SI and Hi and contracts for safety. He rates his depression and anxiety a 0/10. He would like to remain positive and think positive thoughts. Pt denies any cravings at this time. Pt rates his depression a 0/10 and his anxiety a 0/10.He continues to get insulin coverage for elevated sugars.

## 2014-12-11 NOTE — Progress Notes (Signed)
[facility] MD Progress Note  12/11/2014 3:06 PM Tyler Mann  MRN:  [id number] Subjective:  Patient states "I'm not really sure why I'm here.  I was thrown out of my parents house and told to come here; I got to meet their recommendations.  Between jail and here my blood sugar been messing up." Patient is irritable, angry but denies mood swings; states that he gets frustrated when asked the same question by 14 different people "wouldn't you get fucking pissed to.    Principal Problem: Substance induced mood disorder Diagnosis:   Patient Active Problem List   Diagnosis Date Noted  . Substance induced mood disorder [F19.94] 12/08/2014  . Hyperglycemia without ketosis [R73.9]   . Diabetes type 1, uncontrolled [E10.65]   . Suicidal ideation [R45.851] 12/05/2014    Class: Acute  . Hyperglycemia [R73.9] 12/03/2014  . Substance abuse [F19.10] 12/03/2014  . Hypokalemia [E87.6] 11/03/2014  . Leukocytosis [D72.829] 11/02/2014  . Hyperkalemia [E87.5] 11/02/2014  . Chronic back pain [M54.9, G89.29] 11/02/2014  . Hematemesis [K92.0] 11/02/2014  . Generalized anxiety disorder [F41.1] 11/02/2014  . DKA (diabetic ketoacidoses) [E13.10] 11/19/2013  . DKA, type 1 [E10.10] 11/22/2012  . Opiate dependence [F11.20] 11/22/2012  . Tobacco abuse [Z72.0] 11/22/2012  . Diabetic nephropathy [E11.21] 11/22/2012  . Diabetic ketoacidosis [E13.10] 09/20/2012  . Diabetes mellitus type 1 [E10.9] 09/20/2012  . Chronic pain [G89.29] 09/20/2012  . Hyperlipidemia [E78.5] 09/20/2012   Total Time spent with patient: 20 minutes   Past Medical History:  Past Medical History  Diagnosis Date  . Diabetes mellitus   . Neuropathy   . Narcotic addiction     Hx IV heroin abuse  . Chronic back pain     Past Surgical History  Procedure Laterality Date  . Mouth surgery     Family History: History reviewed. No pertinent family history. Social History:  History  Alcohol Use No     History  Drug Use  . Yes  .  Special: Methamphetamines, Heroin    Comment: recovering from heroin-     History   Social History  . Marital Status: Single    Spouse Name: N/A    Number of Children: N/A  . Years of Education: N/A   Social History Main Topics  . Smoking status: Current Every Day Smoker -- 1.00 packs/day for 17 years    Types: Cigarettes  . Smokeless tobacco: Current User  . Alcohol Use: No  . Drug Use: Yes    Special: Methamphetamines, Heroin     Comment: recovering from heroin-   . Sexual Activity: Yes   Other Topics Concern  . None   Social History Narrative   Additional History:    Sleep: Good  Appetite:  Good   Assessment:   Musculoskeletal: Strength & Muscle Tone: within normal limits Gait & Station: normal Patient leans: N/A   Psychiatric Specialty Exam: Physical Exam  Constitutional: He is oriented to person, place, and time. He appears well-developed.  Neck: Normal range of motion.  Respiratory: Effort normal.  Musculoskeletal: Normal range of motion.  Neurological: He is alert and oriented to person, place, and time.  Skin: Skin is warm and dry.  Psychiatric: His speech is normal. His affect is angry. He is agitated. Thought content is not paranoid. He expresses impulsivity. He expresses no homicidal and no suicidal ideation. He expresses no suicidal plans.    Review of Systems  Psychiatric/Behavioral: Positive for substance abuse. Negative for memory loss. Depression: Depressed. Suicidal ideas: Denies. Hallucinations:  Denies. Nervous/anxious: Denies. Insomnia: Denies.   All other systems reviewed and are negative.   Blood pressure 128/78, pulse 86, temperature 97.9 F (36.6 C), temperature source Oral, resp. rate 18, height 6' (1.829 m), weight 74.163 kg (163 lb 8 oz), SpO2 99 %.Body mass index is 22.17 kg/(m^2).  General Appearance: Casual  Eye Contact::  Good  Speech:  Clear and Coherent and Normal Rate  Volume:  Normal  Mood:  Irritable  Affect:  Congruent   Thought Process:  Circumstantial  Orientation:  Full (Time, Place, and Person)  Thought Content:  Rumination  Suicidal Thoughts:  No  Homicidal Thoughts:  No  Memory:  Immediate;   Good Recent;   Good Remote;   Good  Judgement:  Fair  Insight:  Lacking  Psychomotor Activity:  Normal  Concentration:  Fair  Recall:  Good  Fund of Knowledge:Fair  Language: Good  Akathisia:  No  Handed:  Right  AIMS (if indicated):     Assets:  Communication Skills Desire for Improvement  ADL's:  Intact  Cognition: WNL  Sleep:  Number of Hours: 5.75     Current Medications: Current Facility-Administered Medications  Medication Dose Route Frequency Provider Last Rate Last Dose  . acetaminophen (TYLENOL) tablet 650 mg  650 mg Oral Q6H PRN Laverle Hobby, PA-C      . ALPRAZolam Duanne Moron) tablet 0.5 mg  0.5 mg Oral TID PRN Laverle Hobby, PA-C   0.5 mg at 12/11/14 1207  . alum & mag hydroxide-simeth (MAALOX/MYLANTA) 200-200-20 MG/5ML suspension 30 mL  30 mL Oral Q4H PRN Laverle Hobby, PA-C      . cloNIDine (CATAPRES) tablet 0.1 mg  0.1 mg Oral BH-qamhs Laverle Hobby, PA-C   0.1 mg at 12/10/14 2146   Followed by  . [START ON 12/12/2014] cloNIDine (CATAPRES) tablet 0.1 mg  0.1 mg Oral QAC breakfast Laverle Hobby, PA-C   0.1 mg at 12/11/14 0755  . cyclobenzaprine (FLEXERIL) tablet 10 mg  10 mg Oral TID PRN Laverle Hobby, PA-C   10 mg at 12/10/14 2146  . dicyclomine (BENTYL) tablet 20 mg  20 mg Oral Q6H PRN Laverle Hobby, PA-C      . feeding supplement (GLUCERNA SHAKE) (GLUCERNA SHAKE) liquid 237 mL  237 mL Oral BID BM Clayton Bibles, RD   237 mL at 12/11/14 1000  . hydrOXYzine (ATARAX/VISTARIL) tablet 25 mg  25 mg Oral Q6H PRN Laverle Hobby, PA-C      . insulin aspart (novoLOG) injection 0-20 Units  0-20 Units Subcutaneous TID WC Robbie Lis, MD   7 Units at 12/11/14 1202  . insulin aspart (novoLOG) injection 0-5 Units  0-5 Units Subcutaneous QHS Laverle Hobby, PA-C   0 Units at 12/08/14  2244  . insulin aspart (novoLOG) injection 10 Units  10 Units Subcutaneous TID WC Velvet Bathe, MD   10 Units at 12/11/14 0800  . insulin detemir (LEVEMIR) injection 29 Units  29 Units Subcutaneous QHS Velvet Bathe, MD   29 Units at 12/10/14 2145  . loperamide (IMODIUM) capsule 2-4 mg  2-4 mg Oral PRN Laverle Hobby, PA-C      . magnesium hydroxide (MILK OF MAGNESIA) suspension 30 mL  30 mL Oral Daily PRN Laverle Hobby, PA-C      . meloxicam (MOBIC) tablet 15 mg  15 mg Oral Daily Laverle Hobby, PA-C   15 mg at 12/11/14 0756  . methocarbamol (ROBAXIN) tablet 500 mg  500  mg Oral Q8H PRN Laverle Hobby, PA-C      . Milnacipran (SAVELLA) tablet TABS 50 mg  50 mg Oral BID Laverle Hobby, PA-C   50 mg at 12/11/14 0756  . naproxen (NAPROSYN) tablet 500 mg  500 mg Oral BID PRN Laverle Hobby, PA-C   500 mg at 12/10/14 2146  . ondansetron (ZOFRAN-ODT) disintegrating tablet 4 mg  4 mg Oral Q6H PRN Laverle Hobby, PA-C      . traZODone (DESYREL) tablet 50 mg  50 mg Oral QHS,MR X 1 Laverle Hobby, PA-C   50 mg at 12/10/14 2252    Lab Results:  Results for orders placed or performed during the hospital encounter of 12/07/14 (from the past 48 hour(s))  Glucose, capillary     Status: None   Collection Time: 12/09/14  5:22 PM  Result Value Ref Range   Glucose-Capillary 83 70 - 99 mg/dL   Comment 1 Notify RN   Hemoglobin A1c     Status: Abnormal   Collection Time: 12/09/14  7:15 PM  Result Value Ref Range   Hgb A1c MFr Bld 10.4 (H) <5.7 %    Comment: (NOTE)                                                                       According to the ADA Clinical Practice Recommendations for 2011, when HbA1c is used as a screening test:  >=6.5%   Diagnostic of Diabetes Mellitus           (if abnormal result is confirmed) 5.7-6.4%   Increased risk of developing Diabetes Mellitus References:Diagnosis and Classification of Diabetes Mellitus,Diabetes RAXE,9407,68(GSUPJ 1):S62-S69 and Standards of  Medical Care in         Diabetes - 2011,Diabetes Care,2011,34 (Suppl 1):S11-S61.    Mean Plasma Glucose 252 (H) <117 mg/dL    Comment: Performed at [place] metabolic panel     Status: Abnormal   Collection Time: 12/09/14  7:15 PM  Result Value Ref Range   Sodium 136 135 - 145 mmol/L    Comment: Please note change in reference range.   Potassium 4.3 3.5 - 5.1 mmol/L    Comment: Please note change in reference range. DELTA CHECK NOTED REPEATED TO VERIFY    Chloride 99 96 - 112 mEq/L   CO2 27 19 - 32 mmol/L   Glucose, Bld 64 (L) 70 - 99 mg/dL   BUN 23 6 - 23 mg/dL   Creatinine, Ser 0.86 0.50 - 1.35 mg/dL   Calcium 8.7 8.4 - 10.5 mg/dL   GFR calc non Af Amer >90 >90 mL/min   GFR calc Af Amer >90 >90 mL/min    Comment: (NOTE) The eGFR has been calculated using the CKD EPI equation. This calculation has not been validated in all clinical situations. eGFR's persistently <90 mL/min signify possible Chronic Kidney Disease.    Anion gap 10 5 - 15    Comment: Performed at [facility]  Glucose, capillary     Status: Abnormal   Collection Time: 12/09/14  9:33 PM  Result Value Ref Range   Glucose-Capillary 168 (H) 70 - 99 mg/dL  Glucose, capillary     Status: Abnormal  Collection Time: 12/10/14  6:10 AM  Result Value Ref Range   Glucose-Capillary 68 (L) 70 - 99 mg/dL  Glucose, capillary     Status: Abnormal   Collection Time: 12/10/14  6:37 AM  Result Value Ref Range   Glucose-Capillary 66 (L) 70 - 99 mg/dL  Glucose, capillary     Status: Abnormal   Collection Time: 12/10/14  7:45 AM  Result Value Ref Range   Glucose-Capillary 199 (H) 70 - 99 mg/dL  Glucose, capillary     Status: Abnormal   Collection Time: 12/10/14 12:05 PM  Result Value Ref Range   Glucose-Capillary 373 (H) 70 - 99 mg/dL   Comment 1 Notify RN   Glucose, capillary     Status: Abnormal   Collection Time: 12/10/14  4:54 PM  Result Value Ref Range   Glucose-Capillary 151  (H) 70 - 99 mg/dL   Comment 1 Notify RN   Glucose, capillary     Status: Abnormal   Collection Time: 12/10/14  9:02 PM  Result Value Ref Range   Glucose-Capillary 180 (H) 70 - 99 mg/dL  Glucose, capillary     Status: Abnormal   Collection Time: 12/11/14  5:58 AM  Result Value Ref Range   Glucose-Capillary 107 (H) 70 - 99 mg/dL  Glucose, capillary     Status: Abnormal   Collection Time: 12/11/14  7:47 AM  Result Value Ref Range   Glucose-Capillary 272 (H) 70 - 99 mg/dL  Glucose, capillary     Status: Abnormal   Collection Time: 12/11/14 11:55 AM  Result Value Ref Range   Glucose-Capillary 231 (H) 70 - 99 mg/dL    Physical Findings: AIMS: Facial and Oral Movements Muscles of Facial Expression: None, normal Lips and Perioral Area: None, normal Jaw: None, normal Tongue: None, normal,Extremity Movements Upper (arms, wrists, hands, fingers): None, normal Lower (legs, knees, ankles, toes): None, normal, Trunk Movements Neck, shoulders, hips: None, normal, Overall Severity Severity of abnormal movements (highest score from questions above): None, normal Incapacitation due to abnormal movements: None, normal Patient's awareness of abnormal movements (rate only patient's report): No Awareness, Dental Status Current problems with teeth and/or dentures?: Yes Does patient usually wear dentures?: No  CIWA:  CIWA-Ar Total: 5 COWS:  COWS Total Score: 4  Treatment Plan Summary: Daily contact with patient to assess and evaluate symptoms and progress in treatment and Medication management  Will continue with current treatment plan  Medical Decision Making:  Review of Psycho-Social Stressors (1), Review or order clinical lab tests (1), Review of Last Therapy Session (1) and Review of Medication Regimen & Side Effects (2) Problem Points:  Established problem, stable/improving (1), Review of last therapy session (1) and Review of psycho-social stressors (1) Data Points:  Review or order  clinical lab tests (1) Review of medication regiment & side effects (2)    Rankin, Shuvon, FNP-BC 12/11/2014, 3:06 PM   I agreed with the findings, treatment and disposition plan of this patient. Berniece Andreas, MD

## 2014-12-11 NOTE — Plan of Care (Signed)
Problem: Ineffective individual coping Goal: STG: Patient will remain free from self harm Outcome: Progressing Pt safe on unit    Goal: STG:Pt. will utilize relaxation techniques to reduce stress STG: Patient will utilize relaxation techniques to reduce stress levels  Outcome: Progressing Pt using laughter to dectrease stress aeb watching pt interact in dayroom     Problem: Alteration in mood; excessive anxiety as evidenced by: Goal: STG-Pt will report an absence of self-harm thoughts/actions (Patient will report an absence of self-harm thoughts or actions)  Outcome: Progressing Pt denies SI/HI/AVH

## 2014-12-11 NOTE — [facility] Group Notes (Signed)
[place] LCSW Group Therapy Note  12/11/2014 / 11:15 AM  Type of Therapy and Topic:  Group Therapy: Avoiding Self-Sabotaging and Enabling Behaviors  Participation Level:  Active  Therapeutic Goals: 1. Patient will identify one obstacle that relates to self-sabotage and enabling behaviors 2. Patient will identify one personal self-sabotaging or enabling behavior they did prior to admission 3. Patient able to establish a plan to change the above identified behavior they did prior to admission:  4. Patient will demonstrate ability to communicate their needs through discussion and/or role plays.   Summary of Patient Progress: The main focus of today's process group was to discuss what "self-sabotage" means and use Motivational Interviewing to discuss what benefits, negative or positive, were involved in a self-identified self-sabotaging behavior. We then talked about reasons the patient may want to change the behavior and current desire to change. The patient identified with self sabotaging in the form of "giving up.'  He was engaged in group process as evidenced by his own comments. Pt was able to process some of his resentments towards others including family and society yet was called out to met with medical staff before having the opportunity to report what he is willing to work on. Patient did not return to group room.     Therapeutic Modalities:   Cognitive Behavioral Therapy Person-Centered Therapy Motivational Interviewing   Sheilah Pigeon, LCSW

## 2014-12-11 NOTE — Progress Notes (Signed)
[facility] Group Notes:  (Nursing/MHT/Case Management/Adjunct)  Date:  12/11/2014  Time:  9:54 PM  Type of Therapy:  Psychoeducational Skills  Participation Level:  Active  Participation Quality:  Appropriate  Affect:  Appropriate  Cognitive:  Appropriate  Insight:  Good  Engagement in Group:  Engaged  Modes of Intervention:  Education  Summary of Progress/Problems: The patient offered the same responses in group this evening as he did last evening. He states that he will be focusing on himself since he has learned that other people aren't reliable. As a theme for the day, his support system will consist of himself.  Hazle CocaGOODMAN, Jalacia Mattila S 12/11/2014, 9:54 PM

## 2014-12-11 NOTE — Progress Notes (Signed)
D: Pt denies SI/HI/AVH. Pt appears to act entitled, pt believes he can say ad do whatever he wants to staff and patients.  Pt observed joking, laughing and interacting with patients. Pt very silly and appears to be in no distress emotionally or physically.   A: Pt was offered support and encouragement. Pt was given scheduled medications. Pt was encourage to attend groups. Q 15 minute checks were done for safety.   R:Pt attends groups and interacts well with peers and staff. Pt is taking medication. Pt has no complaints at this time .Pt receptive to treatment and safety maintained on unit.

## 2014-12-12 LAB — GLUCOSE, CAPILLARY
GLUCOSE-CAPILLARY: 79 mg/dL (ref 70–99)
GLUCOSE-CAPILLARY: 156 mg/dL — AB (ref 70–99)
GLUCOSE-CAPILLARY: 294 mg/dL — AB (ref 70–99)
Glucose-Capillary: 207 mg/dL — ABNORMAL HIGH (ref 70–99)
Glucose-Capillary: 196 mg/dL — ABNORMAL HIGH (ref 70–99)

## 2014-12-12 NOTE — [facility] Group Notes (Signed)
[facility] LCSW Group Therapy  12/12/2014   1:15 PM   Type of Therapy:  Group Therapy  Participation Level:  Active  Participation Quality:  Appropriate and Attentive  Affect:  Appropriate, Bright  Cognitive:  Alert and Appropriate  Insight:  Developing/Improving and Engaged  Engagement in Therapy:  Developing/Improving and Engaged  Modes of Intervention:  Clarification, Confrontation, Discussion, Education, Exploration, Limit-setting, Orientation, Problem-solving, Rapport Building, Dance movement psychotherapisteality Testing, Socialization and Support  Summary of Progress/Problems: The main focus of today's process group was to identify the patient's current support system and decide on other supports that can be put in place.  An emphasis was placed on using counselor, doctor, therapy groups, 12-step groups, and problem-specific support groups to expand supports, as well as doing something different than has been done before.  Pt shared that he has three friends that are supportive but now relies on himself as his main support.  Pt explained that he is going to stop caring for everyone else and focus on himself now.  Pt was an active participant in group discussion, being supportive as well as confronting peers in a positive way.     Tyler IvanChelsea Horton, LCSW 12/12/2014 2:29 PM

## 2014-12-12 NOTE — Progress Notes (Signed)
D: Pt denies SI/HI/AVH. Pt is pleasant and cooperative. Pt continues to  Be seen on the unit interacting with patients.   A: Pt was offered support and encouragement. Pt was given scheduled medications. Pt was encourage to attend groups. Q 15 minute checks were done for safety.   R:Pt attends groups and interacts well with peers and staff. Pt is taking medication. Pt has no complaints at this time .Pt receptive to treatment and safety maintained on unit.

## 2014-12-12 NOTE — Progress Notes (Signed)
The patient attended this evening's Wrap-Up group  and was appropriate.

## 2014-12-12 NOTE — Plan of Care (Signed)
Problem: Alteration in mood & ability to function due to Goal: LTG-Pt reports reduction in suicidal thoughts (Patient reports reduction in suicidal thoughts and is able to verbalize a safety plan for whenever patient is feeling suicidal)  Outcome: Progressing Pt denies SI at this time Goal: STG: Patient verbalizes decreases in signs of withdrawal Outcome: Progressing Pt denies any withdrawal Sx at this time

## 2014-12-12 NOTE — Progress Notes (Signed)
Patient ID: Tyler Mann, male   DOB: 07-Dec-1980, 34 y.o.   MRN: [id number] [facility] MD Progress Note  12/12/2014 1:19 PM Tyler Mann  MRN:  [id number]   Subjective: Patient states that he is doing good "I feel good; I'm ready to go home. If you guys don't hurry up and send me home I'm gonna have to stay a while cause yall spoiling me here; a place to sleep and 3 meals a day."  Patient denies suicidal/homicidal ideation, psychosis, and paranoia.  Eating, sleeping, and tolerating medications without adverse effects. Patient also states that he is going and participating in group sessions.      Principal Problem: Substance induced mood disorder Diagnosis:   Patient Active Problem List   Diagnosis Date Noted  . Substance induced mood disorder [F19.94] 12/08/2014  . Hyperglycemia without ketosis [R73.9]   . Diabetes type 1, uncontrolled [E10.65]   . Suicidal ideation [R45.851] 12/05/2014    Class: Acute  . Hyperglycemia [R73.9] 12/03/2014  . Substance abuse [F19.10] 12/03/2014  . Hypokalemia [E87.6] 11/03/2014  . Leukocytosis [D72.829] 11/02/2014  . Hyperkalemia [E87.5] 11/02/2014  . Chronic back pain [M54.9, G89.29] 11/02/2014  . Hematemesis [K92.0] 11/02/2014  . Generalized anxiety disorder [F41.1] 11/02/2014  . DKA (diabetic ketoacidoses) [E13.10] 11/19/2013  . DKA, type 1 [E10.10] 11/22/2012  . Opiate dependence [F11.20] 11/22/2012  . Tobacco abuse [Z72.0] 11/22/2012  . Diabetic nephropathy [E11.21] 11/22/2012  . Diabetic ketoacidosis [E13.10] 09/20/2012  . Diabetes mellitus type 1 [E10.9] 09/20/2012  . Chronic pain [G89.29] 09/20/2012  . Hyperlipidemia [E78.5] 09/20/2012   Total Time spent with patient: 20 minutes   Past Medical History:  Past Medical History  Diagnosis Date  . Diabetes mellitus   . Neuropathy   . Narcotic addiction     Hx IV heroin abuse  . Chronic back pain     Past Surgical History  Procedure Laterality Date  . Mouth surgery     Family History:  History reviewed. No pertinent family history. Social History:  History  Alcohol Use No     History  Drug Use  . Yes  . Special: Methamphetamines, Heroin    Comment: recovering from heroin-     History   Social History  . Marital Status: Single    Spouse Name: N/A    Number of Children: N/A  . Years of Education: N/A   Social History Main Topics  . Smoking status: Current Every Day Smoker -- 1.00 packs/day for 17 years    Types: Cigarettes  . Smokeless tobacco: Current User  . Alcohol Use: No  . Drug Use: Yes    Special: Methamphetamines, Heroin     Comment: recovering from heroin-   . Sexual Activity: Yes   Other Topics Concern  . None   Social History Narrative   Additional History:    Sleep: Good  Appetite:  Good   Assessment:   Musculoskeletal: Strength & Muscle Tone: within normal limits Gait & Station: normal Patient leans: N/A   Psychiatric Specialty Exam: Physical Exam  Constitutional: He is oriented to person, place, and time. He appears well-developed.  Neck: Normal range of motion.  Respiratory: Effort normal.  Musculoskeletal: Normal range of motion.  Neurological: He is alert and oriented to person, place, and time.  Skin: Skin is warm and dry.  Psychiatric: He has a normal mood and affect. His speech is normal and behavior is normal. Thought content is not paranoid. He expresses impulsivity. He expresses no homicidal and  no suicidal ideation. He expresses no suicidal plans.    Review of Systems  Psychiatric/Behavioral: Positive for substance abuse. Negative for memory loss. Suicidal ideas: Denies. Nervous/anxious: Denies. Insomnia: Denies.   All other systems reviewed and are negative.   Blood pressure 115/74, pulse 103, temperature 98.3 F (36.8 C), temperature source Oral, resp. rate 18, height 6' (1.829 m), weight 74.163 kg (163 lb 8 oz), SpO2 99 %.Body mass index is 22.17 kg/(m^2).  General Appearance: Casual  Eye Contact::  Good   Speech:  Clear and Coherent and Normal Rate  Volume:  Normal  Mood:  Irritable  Affect:  Congruent  Thought Process:  Circumstantial  Orientation:  Full (Time, Place, and Person)  Thought Content:  Rumination  Suicidal Thoughts:  No  Homicidal Thoughts:  No  Memory:  Immediate;   Good Recent;   Good Remote;   Good  Judgement:  Fair  Insight:  Lacking  Psychomotor Activity:  Normal  Concentration:  Fair  Recall:  Good  Fund of Knowledge:Fair  Language: Good  Akathisia:  No  Handed:  Right  AIMS (if indicated):     Assets:  Communication Skills Desire for Improvement  ADL's:  Intact  Cognition: WNL  Sleep:  Number of Hours: 5.25     Current Medications: Current Facility-Administered Medications  Medication Dose Route Frequency Provider Last Rate Last Dose  . acetaminophen (TYLENOL) tablet 650 mg  650 mg Oral Q6H PRN Kerry Hough, PA-C      . ALPRAZolam Prudy Feeler) tablet 0.5 mg  0.5 mg Oral TID PRN Kerry Hough, PA-C   0.5 mg at 12/11/14 2224  . alum & mag hydroxide-simeth (MAALOX/MYLANTA) 200-200-20 MG/5ML suspension 30 mL  30 mL Oral Q4H PRN Kerry Hough, PA-C      . cyclobenzaprine (FLEXERIL) tablet 10 mg  10 mg Oral TID PRN Kerry Hough, PA-C   10 mg at 12/11/14 2223  . dicyclomine (BENTYL) tablet 20 mg  20 mg Oral Q6H PRN Kerry Hough, PA-C      . feeding supplement (GLUCERNA SHAKE) (GLUCERNA SHAKE) liquid 237 mL  237 mL Oral BID BM Tilda Franco, RD   237 mL at 12/11/14 1000  . hydrOXYzine (ATARAX/VISTARIL) tablet 25 mg  25 mg Oral Q6H PRN Kerry Hough, PA-C      . insulin aspart (novoLOG) injection 0-20 Units  0-20 Units Subcutaneous TID WC Alison Murray, MD   7 Units at 12/12/14 1204  . insulin aspart (novoLOG) injection 0-5 Units  0-5 Units Subcutaneous QHS Kerry Hough, PA-C   0 Units at 12/08/14 2244  . insulin aspart (novoLOG) injection 10 Units  10 Units Subcutaneous TID WC Penny Pia, MD   10 Units at 12/12/14 1206  . insulin detemir  (LEVEMIR) injection 29 Units  29 Units Subcutaneous QHS Penny Pia, MD   29 Units at 12/11/14 2227  . loperamide (IMODIUM) capsule 2-4 mg  2-4 mg Oral PRN Kerry Hough, PA-C      . magnesium hydroxide (MILK OF MAGNESIA) suspension 30 mL  30 mL Oral Daily PRN Kerry Hough, PA-C      . meloxicam (MOBIC) tablet 15 mg  15 mg Oral Daily Kerry Hough, PA-C   15 mg at 12/12/14 0800  . methocarbamol (ROBAXIN) tablet 500 mg  500 mg Oral Q8H PRN Kerry Hough, PA-C      . Milnacipran (SAVELLA) tablet TABS 50 mg  50 mg Oral BID Kerry Hough,  PA-C   50 mg at 12/12/14 0800  . naproxen (NAPROSYN) tablet 500 mg  500 mg Oral BID PRN Kerry HoughSpencer E Simon, PA-C   500 mg at 12/11/14 2224  . ondansetron (ZOFRAN-ODT) disintegrating tablet 4 mg  4 mg Oral Q6H PRN Kerry HoughSpencer E Simon, PA-C      . traZODone (DESYREL) tablet 50 mg  50 mg Oral QHS,MR X 1 Kerry HoughSpencer E Simon, PA-C   50 mg at 12/08/14 2243    Lab Results:  Results for orders placed or performed during the hospital encounter of 12/07/14 (from the past 48 hour(s))  Glucose, capillary     Status: Abnormal   Collection Time: 12/10/14  4:54 PM  Result Value Ref Range   Glucose-Capillary 151 (H) 70 - 99 mg/dL   Comment 1 Notify RN   Glucose, capillary     Status: Abnormal   Collection Time: 12/10/14  9:02 PM  Result Value Ref Range   Glucose-Capillary 180 (H) 70 - 99 mg/dL  Glucose, capillary     Status: Abnormal   Collection Time: 12/11/14  5:58 AM  Result Value Ref Range   Glucose-Capillary 107 (H) 70 - 99 mg/dL  Glucose, capillary     Status: Abnormal   Collection Time: 12/11/14  7:47 AM  Result Value Ref Range   Glucose-Capillary 272 (H) 70 - 99 mg/dL  Glucose, capillary     Status: Abnormal   Collection Time: 12/11/14 11:55 AM  Result Value Ref Range   Glucose-Capillary 231 (H) 70 - 99 mg/dL  Glucose, capillary     Status: Abnormal   Collection Time: 12/11/14  5:07 PM  Result Value Ref Range   Glucose-Capillary 285 (H) 70 - 99 mg/dL   Glucose, capillary     Status: Abnormal   Collection Time: 12/11/14  8:29 PM  Result Value Ref Range   Glucose-Capillary 170 (H) 70 - 99 mg/dL   Comment 1 Notify RN   Glucose, capillary     Status: None   Collection Time: 12/12/14  5:59 AM  Result Value Ref Range   Glucose-Capillary 79 70 - 99 mg/dL   Comment 1 Notify RN   Glucose, capillary     Status: Abnormal   Collection Time: 12/12/14  7:22 AM  Result Value Ref Range   Glucose-Capillary 156 (H) 70 - 99 mg/dL   Comment 1 Notify RN   Glucose, capillary     Status: Abnormal   Collection Time: 12/12/14 11:52 AM  Result Value Ref Range   Glucose-Capillary 207 (H) 70 - 99 mg/dL   Comment 1 Notify RN     Physical Findings: AIMS: Facial and Oral Movements Muscles of Facial Expression: None, normal Lips and Perioral Area: None, normal Jaw: None, normal Tongue: None, normal,Extremity Movements Upper (arms, wrists, hands, fingers): None, normal Lower (legs, knees, ankles, toes): None, normal, Trunk Movements Neck, shoulders, hips: None, normal, Overall Severity Severity of abnormal movements (highest score from questions above): None, normal Incapacitation due to abnormal movements: None, normal Patient's awareness of abnormal movements (rate only patient's report): No Awareness, Dental Status Current problems with teeth and/or dentures?: Yes Does patient usually wear dentures?: No  CIWA:  CIWA-Ar Total: 5 COWS:  COWS Total Score: 4  Treatment Plan Summary: Daily contact with patient to assess and evaluate symptoms and progress in treatment and Medication management  Will continue with current treatment plan  Medical Decision Making:  Review of Psycho-Social Stressors (1), Review or order clinical lab tests (1), Review of Last  Therapy Session (1) and Review of Medication Regimen & Side Effects (2) Problem Points:  Established problem, stable/improving (1), Review of last therapy session (1) and Review of psycho-social  stressors (1) Data Points:  Review or order clinical lab tests (1) Review of medication regiment & side effects (2)  Will continue current treatment plan with no changes at this time.  Rankin, Shuvon, FNP-BC 12/12/2014, 1:19 PM   I have personally seen the patient and agreed with the findings and involved in the treatment plan. Kathryne Sharper, MD

## 2014-12-13 DIAGNOSIS — F1994 Other psychoactive substance use, unspecified with psychoactive substance-induced mood disorder: Secondary | ICD-10-CM

## 2014-12-13 LAB — GLUCOSE, CAPILLARY
Glucose-Capillary: 79 mg/dL (ref 70–99)
Glucose-Capillary: 342 mg/dL — ABNORMAL HIGH (ref 70–99)

## 2014-12-13 MED ORDER — MILNACIPRAN HCL 50 MG PO TABS: 50.0000 mg | ORAL_TABLET | Freq: Two times a day (BID) | ORAL | Status: DC

## 2014-12-13 MED ORDER — ALPRAZOLAM 0.5 MG PO TABS: 0.5000 mg | ORAL_TABLET | Freq: Three times a day (TID) | ORAL | Status: DC | PRN

## 2014-12-13 MED ORDER — INSULIN ASPART 100 UNIT/ML ~~LOC~~ SOLN: 10.0000 [IU] | Freq: Three times a day (TID) | SUBCUTANEOUS | Status: DC

## 2014-12-13 MED ORDER — MELOXICAM 15 MG PO TABS: 15.0000 mg | ORAL_TABLET | Freq: Every day | ORAL | Status: DC

## 2014-12-13 MED ORDER — TRAZODONE HCL 50 MG PO TABS: 50.0000 mg | ORAL_TABLET | Freq: Every evening | ORAL | Status: DC | PRN

## 2014-12-13 MED ORDER — INSULIN DETEMIR 100 UNIT/ML ~~LOC~~ SOLN: 29.0000 [IU] | Freq: Every day | SUBCUTANEOUS | Status: DC

## 2014-12-13 MED ORDER — INSULIN ASPART 100 UNIT/ML ~~LOC~~ SOLN: SUBCUTANEOUS | Status: DC

## 2014-12-13 MED ORDER — CYCLOBENZAPRINE HCL 10 MG PO TABS: 10.0000 mg | ORAL_TABLET | Freq: Three times a day (TID) | ORAL | Status: DC | PRN

## 2014-12-13 NOTE — Progress Notes (Signed)
  [facility] :  Will you be returning to the same living situation after discharge:  Yes,  patient plans to discharge home with his parents At discharge, do you have transportation home?: Yes,  patient reports access to transportation Do you have the ability to pay for your medications: Yes,  patient will be provided with prescriptions at discharge and reports ability to obtain medications.  Release of information consent forms completed and in the chart;  Patient's signature needed at discharge.  Patient to Follow up at: Follow-up Information    Follow up with Catalina PizzaHALL, ZACH, MD. Schedule an appointment as soon as possible for a visit in 1 week.   Specialty:  Internal Medicine   Why:  For further evaluation   Contact information:    [address]  [place] [address] [phone]       [facility].   Why:  Walk-in clinic on Wednesday January 27th between 7:45 and 10:30 am for assessment for therapy and medication management services. Please call office if you need to reschedule appointment.   Contact information:   [address],  [place], [place] [address] [phone]        Patient denies SI/HI: Yes,  denies    Safety Planning and Suicide Prevention discussed: Yes,  with patient and father  Has patient been referred to the Quitline?: Yes, faxed on 12/13/14  Carsen Machi, [place] CarboKristin L 12/13/2014, 10:24 AM

## 2014-12-13 NOTE — [facility] Group Notes (Signed)
[facility] LCSW Group Therapy 12/13/2014  1:15 pm  Type of Therapy: Group Therapy Participation Level: Active  Participation Quality: Attentive, Sharing and Supportive  Affect: Appropriate  Cognitive: Alert and Oriented  Insight: Developing/Improving and Engaged  Engagement in Therapy: Developing/Improving and Engaged  Modes of Intervention: Clarification, Confrontation, Discussion, Education, Exploration,  Limit-setting, Orientation, Problem-solving, Rapport Building, Dance movement psychotherapisteality Testing, Socialization and Support  Summary of Progress/Problems: Pt identified obstacles faced currently and processed barriers involved in overcoming these obstacles. Pt identified steps necessary for overcoming these obstacles and explored motivation (internal and external) for facing these difficulties head on. Pt further identified one area of concern in their lives and chose a goal to focus on for today. Patient identified his thinking patterns and tendency to overanalyze and complicate situations as obstacles. Patient also inquired about how to gain social supports. CSW and other group members provided emotional support and encouragement.  Samuella BruinKristin Newman Waren, MSW, [employer]WA Clinical Social Worker [facility] [phone]

## 2014-12-13 NOTE — [facility] Group Notes (Signed)
   [facility]  12/13/2014  8:45 AM   Participation Quality: Alert, Appropriate and Oriented  Mood/Affect: Appropriate  Depression Rating: 0  Anxiety Rating: 0  Thoughts of Suicide: Pt denies SI/HI  Will you contract for safety? Yes  Current AVH: Pt denies  Plan for Discharge/Comments: Pt attended discharge planning group and actively participated in group. CSW provided pt with today's workbook. Patient reports feeling ready to discharge today. He is hopeful that he can return home with his parents. He is agreeable [facility]. He denies depression, anxiety, or SI/HI.   Transportation Means: Pt reports access to transportation  Supports: No supports mentioned at this time  Samuella BruinKristin Sherree Shankman, MSW, [employer]CSWA Clinical Social Worker [facility] [phone]

## 2014-12-13 NOTE — Clinical Social Work Note (Signed)
CSW spoke with patient's father who reports that patient is able to return home today and that he will pick patient up from hospital around 4:30 pm. Patient updated.  Tyler BruinKristin Amberlin Utke, MSW, [employer]CSWA Clinical Social Worker [facility] [phone]

## 2014-12-13 NOTE — [facility] Suicide Risk Assessment (Signed)
[facility] Discharge Suicide Risk Assessment   Demographic Factors:  34 year old male, single, lives with parents, no children  Total Time spent with patient: 30 minutes  Musculoskeletal: Strength & Muscle Tone: within normal limits Gait & Station: normal Patient leans: N/A  Psychiatric Specialty Exam: Physical Exam  ROS  Blood pressure 113/74, pulse 106, temperature 97.3 F (36.3 C), temperature source Oral, resp. rate 18, height 6' (1.829 m), weight 163 lb 8 oz (74.163 kg), SpO2 99 %.Body mass index is 22.17 kg/(m^2).  General Appearance: improved grooming  Eye Contact::  Good  Speech:  Normal Rate409  Volume:  Normal  Mood:  Euthymic  Affect:  Appropriate and Full Range  Thought Process:  Goal Directed and Linear  Orientation:  Full (Time, Place, and Person)  Thought Content:  no hallucinations,no delusions  Suicidal Thoughts:  No denies any thoughts of hurting self or anyone else. Denies any thoughts of hurting parents in any way.  Homicidal Thoughts:  No  Memory:  recent and remote grossly intact   Judgement:  Fair  Insight:  Fair  Psychomotor Activity:  Normal  Concentration:  Good  Recall:  Good  Fund of Knowledge:Good  Language: Good  Akathisia:  Negative  Handed:  Right  AIMS (if indicated):     Assets:  Desire for Improvement Resilience Social Support  Sleep:  Number of Hours: 5.25  Cognition: WNL  ADL's: improving    Have you used any form of tobacco in the last 30 days? (Cigarettes, Smokeless Tobacco, Cigars, and/or Pipes): Yes  Has this patient used any form of tobacco in the last 30 days? (Cigarettes, Smokeless Tobacco, Cigars, and/or Pipes) Yes, A prescription for an FDA-approved tobacco cessation medication was offered at discharge and the patient refused  Mental Status Per Nursing Assessment::   On Admission:  Suicidal ideation indicated by others  Current Mental Status by Physician: At this time patient is improved , with full range of affect, no  thought disorder, no SI or HI, no psychotic symptoms , future oriented. No withdrawal symptoms at present. Denies cravings.  Loss Factors: Unemployment, strain with parents/ social stressors related to drug use .  Historical Factors: Opiate /Methamphetamine Dependencies, Substance Induced Mood Disorder, denies history of suicide attempts  Risk Reduction Factors:   Sense of responsibility to family, Living with another person, especially a relative and Positive social support  Continued Clinical Symptoms:  At this time patient is improved, euthymic, behavior calm and in good control. No withdrawal symptoms. Denies any SI or HI. Future oriented at this time- states he eventually plans to move to [place] to work with a friend.   Cognitive Features That Contribute To Risk:  No gross cognitive deficits noted upon discharge. Is alert , attentive, and oriented x 3    Suicide Risk:  Mild:  Suicidal ideation of limited frequency, intensity, duration, and specificity.  There are no identifiable plans, no associated intent, mild dysphoria and related symptoms, good self-control (both objective and subjective assessment), few other risk factors, and identifiable protective factors, including available and accessible social support.  Principal Problem: Substance induced mood disorder- polysubstance dependence  Discharge Diagnoses:  Patient Active Problem List   Diagnosis Date Noted  . Substance induced mood disorder [F19.94] 12/08/2014  . Hyperglycemia without ketosis [R73.9]   . Diabetes type 1, uncontrolled [E10.65]   . Suicidal ideation [R45.851] 12/05/2014    Class: Acute  . Hyperglycemia [R73.9] 12/03/2014  . Substance abuse [F19.10] 12/03/2014  . Hypokalemia [E87.6] 11/03/2014  .  Leukocytosis [D72.829] 11/02/2014  . Hyperkalemia [E87.5] 11/02/2014  . Chronic back pain [M54.9, G89.29] 11/02/2014  . Hematemesis [K92.0] 11/02/2014  . Generalized anxiety disorder [F41.1] 11/02/2014  . DKA  (diabetic ketoacidoses) [E13.10] 11/19/2013  . DKA, type 1 [E10.10] 11/22/2012  . Opiate dependence [F11.20] 11/22/2012  . Tobacco abuse [Z72.0] 11/22/2012  . Diabetic nephropathy [E11.21] 11/22/2012  . Diabetic ketoacidosis [E13.10] 09/20/2012  . Diabetes mellitus type 1 [E10.9] 09/20/2012  . Chronic pain [G89.29] 09/20/2012  . Hyperlipidemia [E78.5] 09/20/2012    Follow-up Information    Follow up with Catalina PizzaHALL, ZACH, MD. Schedule an appointment as soon as possible for a visit in 1 week.   Specialty:  Internal Medicine   Why:  For further evaluation   Contact information:    [address]  [place] [address] [phone]       [facility]/2014.   Why:  Walk-in clinic on Wednesday January 27th between 7:45 and 10:30 am for assessment for therapy and medication management services. Please call office if you need to reschedule appointment.   Contact information:   [address],  [place], [place] [address] [phone]        Plan Of Care/Follow-up recommendations:  Activity:  As tolerated Diet:  diabetic Diet Tests:  NA Other:  see below  Is patient on multiple antipsychotic therapies at discharge:  No   Has Patient had three or more failed trials of antipsychotic monotherapy by history:  No  Recommended Plan for Multiple Antipsychotic Therapies: NA  Patient is discharging in good spirits. Plans to return home. Encouraged to attend NA on a regular, hopefull daily basis  Plans to follow up Dr. Margo AyeHall, PCP for medical /DM management. Follow up as above.   Berel Najjar 12/13/2014, 10:52 AM

## 2014-12-13 NOTE — Progress Notes (Signed)
Pt. Discharged- picked up by father.  Papers signed, prescriptions given. Samples given.  All items returned. Pt. Denies SI/HI.

## 2014-12-13 NOTE — Discharge Summary (Signed)
Physician Discharge Summary Note  Patient:  Tyler Mann is an 34 y.o., male MRN:  [id number] DOB:  1981/06/13 Patient phone:  [phone] (home)  Patient address:   [address] [place] [address],  Total Time spent with patient: 30 minutes  Date of Admission:  12/07/2014 Date of Discharge: 12/13/14  Reason for Admission:  Mood stabilization treatments, Substance abuse  Principal Problem: Substance induced mood disorder Discharge Diagnoses: Patient Active Problem List   Diagnosis Date Noted  . Substance induced mood disorder [F19.94] 12/08/2014  . Hyperglycemia without ketosis [R73.9]   . Diabetes type 1, uncontrolled [E10.65]   . Suicidal ideation [R45.851] 12/05/2014    Class: Acute  . Hyperglycemia [R73.9] 12/03/2014  . Substance abuse [F19.10] 12/03/2014  . Hypokalemia [E87.6] 11/03/2014  . Leukocytosis [D72.829] 11/02/2014  . Hyperkalemia [E87.5] 11/02/2014  . Chronic back pain [M54.9, G89.29] 11/02/2014  . Hematemesis [K92.0] 11/02/2014  . Generalized anxiety disorder [F41.1] 11/02/2014  . DKA (diabetic ketoacidoses) [E13.10] 11/19/2013  . DKA, type 1 [E10.10] 11/22/2012  . Opiate dependence [F11.20] 11/22/2012  . Tobacco abuse [Z72.0] 11/22/2012  . Diabetic nephropathy [E11.21] 11/22/2012  . Diabetic ketoacidosis [E13.10] 09/20/2012  . Diabetes mellitus type 1 [E10.9] 09/20/2012  . Chronic pain [G89.29] 09/20/2012  . Hyperlipidemia [E78.5] 09/20/2012    Musculoskeletal: Strength & Muscle Tone: within normal limits Gait & Station: normal Patient leans: N/A  Psychiatric Specialty Exam: Physical Exam  Psychiatric: He has a normal mood and affect. His speech is normal and behavior is normal. Judgment and thought content normal. Cognition and memory are normal.    Review of Systems  Constitutional: Negative.   HENT: Negative.   Eyes: Negative.   Respiratory: Negative.   Cardiovascular: Negative.   Gastrointestinal: Negative.   Genitourinary:  Negative.   Musculoskeletal: Negative.   Skin: Negative.   Neurological: Negative.   Endo/Heme/Allergies: Negative.   Psychiatric/Behavioral: Positive for substance abuse (Stabilized with treatments ). Negative for depression, suicidal ideas, hallucinations and memory loss. The patient is not nervous/anxious and does not have insomnia.     Blood pressure 113/74, pulse 106, temperature 97.3 F (36.3 C), temperature source Oral, resp. rate 18, height 6' (1.829 m), weight 74.163 kg (163 lb 8 oz), SpO2 99 %.Body mass index is 22.17 kg/(m^2).  See Physician SRA     Past Medical History:  Past Medical History  Diagnosis Date  . Diabetes mellitus   . Neuropathy   . Narcotic addiction     Hx IV heroin abuse  . Chronic back pain     Past Surgical History  Procedure Laterality Date  . Mouth surgery     Family History: History reviewed. No pertinent family history. Social History:  History  Alcohol Use No     History  Drug Use  . Yes  . Special: Methamphetamines, Heroin    Comment: recovering from heroin-     History   Social History  . Marital Status: Single    Spouse Name: N/A    Number of Children: N/A  . Years of Education: N/A   Social History Main Topics  . Smoking status: Current Every Day Smoker -- 1.00 packs/day for 17 years    Types: Cigarettes  . Smokeless tobacco: Current User  . Alcohol Use: No  . Drug Use: Yes    Special: Methamphetamines, Heroin     Comment: recovering from heroin-   . Sexual Activity: Yes   Other Topics Concern  . None   Social History Narrative  Past Psychiatric History: Hospitalizations:  Outpatient Care:  Substance Abuse Care:  Self-Mutilation:  Suicidal Attempts:  Violent Behaviors:   Risk to Self: Is patient at risk for suicide?: Yes What has been your use of drugs/alcohol within the last 12 months?: Heroin and meth use, vague on details of use. Does not see substance abuse as an issue for him Risk to Others:   Prior  Inpatient Therapy:   Prior Outpatient Therapy:    Level of Care:  OP  Hospital Course:  Tyler Mann is a 34 year old man, who has a history of substance dependence ( IV opiates and Methamphetamine) and Who presented to ED with decompensated DM ( has Diabetes Mellitus I since childhood) and DKA. He was stabilized. He has had significant turmoil with his parents , and reportedly they kicked him out of the house in December 2015, so he has been homeless for several weeks. During psychiatric assessment in ED patient made suicidal statements such as stating his parents would be burying him soon, and he was also observed to be belligerent and verbally threatening to parents. Because of this patient was committed ( IVC ) . Patient minimizes above, and states " I never said I was going to hurt myself, I am not suicidal " , and denies any thoughts , plans or intention of hurting his parents. His insight is limited, and states " I do not think drugs are a problem for me, I could put them down if I wanted to, I just choose to use sometimes".         Tyler Mann was admitted to the adult unit. He was evaluated and his symptoms were identified. His psychiatric symptoms appeared to be largely related to his substance abuse. Medication management was discussed and initiated. Patient was started on the Clonidine detox protocol due to his history of chronic opiate abuse. His xanax was continued at 0.5 mg tid prn anxiety. The patient was encouraged to be tapered off benzodiazepines due to his history of substance abuse. He was oriented to the unit and encouraged to participate in unit programming. Medical problems were identified and treated appropriately. His insulins dosages were adjusted to provide better control of his Type 1 Diabetes. Home medication was restarted as needed.        The patient was evaluated each day by a clinical provider to ascertain the patient's response to treatment. The patient was reported  to be irritable at times with different staff members.  Improvement was noted by the patient's report of decreasing symptoms, improved sleep and appetite, affect, medication tolerance, behavior, and participation in unit programming.  He was asked each day to complete a self inventory noting mood, mental status, pain, new symptoms, anxiety and concerns.         He responded well to medication and being in a therapeutic and supportive environment. Positive and appropriate behavior was noted and the patient was motivated for recovery.  The patient worked closely with the treatment team and case manager to develop a discharge plan with appropriate goals. Coping skills, problem solving as well as relaxation therapies were also part of the unit programming.         By the day of discharge he was in much improved condition than upon admission.  Symptoms were reported as significantly decreased or resolved completely. The patient denied SI/HI and voiced no AVH. He was motivated to continue taking medication with a goal of continued improvement in mental health.  Tyler Mann was discharged home with a plan to follow up as noted below. Patient was picked up from the hospital by his father. He left [facility] in stable condition with all belongings returned to him. The patient was advised to follow up with Internal Medicine for continued management of his Type 1 Diabetes.   Consults:  None  Significant Diagnostic Studies:  UDS positive for amphetamines, Chemistry panel, CBC, Glucose monitoring   Discharge Vitals:   Blood pressure 113/74, pulse 106, temperature 97.3 F (36.3 C), temperature source Oral, resp. rate 18, height 6' (1.829 m), weight 74.163 kg (163 lb 8 oz), SpO2 99 %. Body mass index is 22.17 kg/(m^2). Lab Results:   Results for orders placed or performed during the hospital encounter of 12/07/14 (from the past 72 hour(s))  Glucose, capillary     Status: Abnormal   Collection Time: 12/10/14 12:05 PM   Result Value Ref Range   Glucose-Capillary 373 (H) 70 - 99 mg/dL   Comment 1 Notify RN   Glucose, capillary     Status: Abnormal   Collection Time: 12/10/14  4:54 PM  Result Value Ref Range   Glucose-Capillary 151 (H) 70 - 99 mg/dL   Comment 1 Notify RN   Glucose, capillary     Status: Abnormal   Collection Time: 12/10/14  9:02 PM  Result Value Ref Range   Glucose-Capillary 180 (H) 70 - 99 mg/dL  Glucose, capillary     Status: Abnormal   Collection Time: 12/11/14  5:58 AM  Result Value Ref Range   Glucose-Capillary 107 (H) 70 - 99 mg/dL  Glucose, capillary     Status: Abnormal   Collection Time: 12/11/14  7:47 AM  Result Value Ref Range   Glucose-Capillary 272 (H) 70 - 99 mg/dL  Glucose, capillary     Status: Abnormal   Collection Time: 12/11/14 11:55 AM  Result Value Ref Range   Glucose-Capillary 231 (H) 70 - 99 mg/dL  Glucose, capillary     Status: Abnormal   Collection Time: 12/11/14  5:07 PM  Result Value Ref Range   Glucose-Capillary 285 (H) 70 - 99 mg/dL  Glucose, capillary     Status: Abnormal   Collection Time: 12/11/14  8:29 PM  Result Value Ref Range   Glucose-Capillary 170 (H) 70 - 99 mg/dL   Comment 1 Notify RN   Glucose, capillary     Status: None   Collection Time: 12/12/14  5:59 AM  Result Value Ref Range   Glucose-Capillary 79 70 - 99 mg/dL   Comment 1 Notify RN   Glucose, capillary     Status: Abnormal   Collection Time: 12/12/14  7:22 AM  Result Value Ref Range   Glucose-Capillary 156 (H) 70 - 99 mg/dL   Comment 1 Notify RN   Glucose, capillary     Status: Abnormal   Collection Time: 12/12/14 11:52 AM  Result Value Ref Range   Glucose-Capillary 207 (H) 70 - 99 mg/dL   Comment 1 Notify RN   Glucose, capillary     Status: Abnormal   Collection Time: 12/12/14  5:08 PM  Result Value Ref Range   Glucose-Capillary 294 (H) 70 - 99 mg/dL   Comment 1 Notify RN   Glucose, capillary     Status: Abnormal   Collection Time: 12/12/14  9:07 PM  Result Value  Ref Range   Glucose-Capillary 196 (H) 70 - 99 mg/dL   Comment 1 Notify RN   Glucose, capillary  Status: None   Collection Time: 12/13/14  6:16 AM  Result Value Ref Range   Glucose-Capillary 79 70 - 99 mg/dL    Physical Findings: AIMS: Facial and Oral Movements Muscles of Facial Expression: None, normal Lips and Perioral Area: None, normal Jaw: None, normal Tongue: None, normal,Extremity Movements Upper (arms, wrists, hands, fingers): None, normal Lower (legs, knees, ankles, toes): None, normal, Trunk Movements Neck, shoulders, hips: None, normal, Overall Severity Severity of abnormal movements (highest score from questions above): None, normal Incapacitation due to abnormal movements: None, normal Patient's awareness of abnormal movements (rate only patient's report): No Awareness, Dental Status Current problems with teeth and/or dentures?: Yes Does patient usually wear dentures?: No  CIWA:  CIWA-Ar Total: 5 COWS:  COWS Total Score: 4   See Psychiatric Specialty Exam and Suicide Risk Assessment completed by Attending Physician prior to discharge.  Discharge destination:  Home  Is patient on multiple antipsychotic therapies at discharge:  No   Has Patient had three or more failed trials of antipsychotic monotherapy by history:  No  Recommended Plan for Multiple Antipsychotic Therapies: NA  Discharge Instructions    Discharge instructions    Complete by:  As directed   Please follow up with your Primary Care Provider for further management of your Type 1 Diabetes. Your insulin dosages were adjusted during your inpatient hospital stay. The Levemir was decreased to 29 units at night and the Novolog meal coverage was increased to 10 units at meal times. Please use the sliding scale coverage as per your Primary Care MD.            Medication List    TAKE these medications      Indication   ALPRAZolam 0.5 MG tablet  Commonly known as:  XANAX  Take 1 tablet (0.5 mg  total) by mouth 3 (three) times daily as needed for anxiety.   Indication:  Feeling Anxious     cyclobenzaprine 10 MG tablet  Commonly known as:  FLEXERIL  Take 1 tablet (10 mg total) by mouth every 8 (eight) hours as needed.   Indication:  Muscle Spasm     insulin aspart 100 UNIT/ML injection  Commonly known as:  novoLOG  Please continue her sliding scale as you used to take at home      insulin aspart 100 UNIT/ML injection  Commonly known as:  novoLOG  Inject 10 Units into the skin 3 (three) times daily with meals.   Indication:  Insulin-Dependent Diabetes     insulin detemir 100 UNIT/ML injection  Commonly known as:  LEVEMIR  Inject 0.29 mLs (29 Units total) into the skin at bedtime.   Indication:  Insulin-Dependent Diabetes, Diabetes     meloxicam 15 MG tablet  Commonly known as:  MOBIC  Take 1 tablet (15 mg total) by mouth daily.   Indication:  Joint Damage causing Pain and Loss of Function     Milnacipran 50 MG Tabs tablet  Commonly known as:  SAVELLA  Take 1 tablet (50 mg total) by mouth 2 (two) times daily.   Indication:  Fibromyalgia Syndrome     traZODone 50 MG tablet  Commonly known as:  DESYREL  Take 1 tablet (50 mg total) by mouth at bedtime and may repeat dose one time if needed.   Indication:  Trouble Sleeping           Follow-up Information    Follow up with Catalina Pizza, MD. Schedule an appointment as soon as possible for a visit  in 1 week.   Specialty:  Internal Medicine   Why:  For further evaluation   Contact information:    [address]  [place] [address] [phone]       Follow up with [facility] On 12/15/2014.   Why:  Walk-in clinic on Wednesday January 27th between 7:45 and 10:30 am for assessment for therapy and medication management services. Please call office if you need to reschedule appointment.   Contact information:   [address],  [place], [place] [address] [phone]        Follow-up recommendations:    Activity: As tolerated Diet: diabetic Diet Tests: NA Other: see below  Comments:  Take all your medications as prescribed by your mental healthcare provider.  Report any adverse effects and or reactions from your medicines to your outpatient provider promptly.  Patient is instructed and cautioned to not engage in alcohol and or illegal drug use while on prescription medicines.  In the event of worsening symptoms, patient is instructed to call the crisis hotline, 911 and or go to the nearest ED for appropriate evaluation and treatment of symptoms.  Follow-up with your primary care provider for your other medical issues, concerns and or health care needs.   Total Discharge Time: Greater than 30 minutes  Signed: DAVIS, LAURA NP-C 12/13/2014, 11:13 AM   Patient seen, Suicide Assessment Completed.  Disposition Plan Reviewed

## 2014-12-16 NOTE — Progress Notes (Signed)
Patient Discharge Instructions:  After Visit Summary (AVS):   Faxed to:  12/16/14 Discharge Summary Note:   Faxed to:  12/16/14 Psychiatric Admission Assessment Note:   Faxed to:  12/16/14 Suicide Risk Assessment - Discharge Assessment:   Faxed to:  12/16/14 Faxed/Sent to the Next Level Care provider:  12/16/14 Faxed to Catalina PizzaZach Hall, MD @ [phone] [facility] @ [phone]  Jerelene ReddenSheena E [place], 12/16/2014, 3:24 PM

## 2014-12-26 ENCOUNTER — Inpatient Hospital Stay (HOSPITAL_COMMUNITY): Payer: BLUE CROSS/BLUE SHIELD

## 2014-12-26 ENCOUNTER — Inpatient Hospital Stay (HOSPITAL_COMMUNITY)
Admission: EM | Admit: 2014-12-26 | Discharge: 2014-12-30 | DRG: 917 | Disposition: A | Discharge status: Other Acute Inpatient Hospital | Payer: BLUE CROSS/BLUE SHIELD | Attending: Internal Medicine | Admitting: Internal Medicine

## 2014-12-26 ENCOUNTER — Encounter (HOSPITAL_COMMUNITY): Payer: Self-pay

## 2014-12-26 DIAGNOSIS — E108 Type 1 diabetes mellitus with unspecified complications: Secondary | ICD-10-CM | POA: Diagnosis not present

## 2014-12-26 DIAGNOSIS — D649 Anemia, unspecified: Secondary | ICD-10-CM | POA: Diagnosis present

## 2014-12-26 DIAGNOSIS — E10649 Type 1 diabetes mellitus with hypoglycemia without coma: Secondary | ICD-10-CM | POA: Diagnosis present

## 2014-12-26 DIAGNOSIS — Z794 Long term (current) use of insulin: Secondary | ICD-10-CM | POA: Diagnosis not present

## 2014-12-26 DIAGNOSIS — Z781 Physical restraint status: Secondary | ICD-10-CM | POA: Diagnosis not present

## 2014-12-26 DIAGNOSIS — T50902S Poisoning by unspecified drugs, medicaments and biological substances, intentional self-harm, sequela: Secondary | ICD-10-CM | POA: Diagnosis not present

## 2014-12-26 DIAGNOSIS — G92 Toxic encephalopathy: Secondary | ICD-10-CM | POA: Diagnosis present

## 2014-12-26 DIAGNOSIS — T50902D Poisoning by unspecified drugs, medicaments and biological substances, intentional self-harm, subsequent encounter: Secondary | ICD-10-CM | POA: Diagnosis not present

## 2014-12-26 DIAGNOSIS — T383X1A Poisoning by insulin and oral hypoglycemic [antidiabetic] drugs, accidental (unintentional), initial encounter: Secondary | ICD-10-CM | POA: Insufficient documentation

## 2014-12-26 DIAGNOSIS — F411 Generalized anxiety disorder: Secondary | ICD-10-CM | POA: Diagnosis present

## 2014-12-26 DIAGNOSIS — F17210 Nicotine dependence, cigarettes, uncomplicated: Secondary | ICD-10-CM | POA: Diagnosis present

## 2014-12-26 DIAGNOSIS — F1120 Opioid dependence, uncomplicated: Secondary | ICD-10-CM | POA: Diagnosis present

## 2014-12-26 DIAGNOSIS — Z599 Problem related to housing and economic circumstances, unspecified: Secondary | ICD-10-CM | POA: Diagnosis not present

## 2014-12-26 DIAGNOSIS — Z609 Problem related to social environment, unspecified: Secondary | ICD-10-CM | POA: Diagnosis present

## 2014-12-26 DIAGNOSIS — E109 Type 1 diabetes mellitus without complications: Secondary | ICD-10-CM | POA: Diagnosis present

## 2014-12-26 DIAGNOSIS — T424X2A Poisoning by benzodiazepines, intentional self-harm, initial encounter: Secondary | ICD-10-CM | POA: Diagnosis present

## 2014-12-26 DIAGNOSIS — R4182 Altered mental status, unspecified: Secondary | ICD-10-CM | POA: Diagnosis present

## 2014-12-26 DIAGNOSIS — E162 Hypoglycemia, unspecified: Secondary | ICD-10-CM | POA: Diagnosis present

## 2014-12-26 DIAGNOSIS — Z978 Presence of other specified devices: Secondary | ICD-10-CM

## 2014-12-26 DIAGNOSIS — T50902A Poisoning by unspecified drugs, medicaments and biological substances, intentional self-harm, initial encounter: Secondary | ICD-10-CM | POA: Diagnosis present

## 2014-12-26 DIAGNOSIS — T383X2A Poisoning by insulin and oral hypoglycemic [antidiabetic] drugs, intentional self-harm, initial encounter: Principal | ICD-10-CM | POA: Diagnosis present

## 2014-12-26 DIAGNOSIS — T424X1A Poisoning by benzodiazepines, accidental (unintentional), initial encounter: Secondary | ICD-10-CM | POA: Insufficient documentation

## 2014-12-26 DIAGNOSIS — G8929 Other chronic pain: Secondary | ICD-10-CM | POA: Diagnosis present

## 2014-12-26 DIAGNOSIS — F1919 Other psychoactive substance abuse with unspecified psychoactive substance-induced disorder: Secondary | ICD-10-CM | POA: Diagnosis not present

## 2014-12-26 DIAGNOSIS — J9690 Respiratory failure, unspecified, unspecified whether with hypoxia or hypercapnia: Secondary | ICD-10-CM | POA: Diagnosis present

## 2014-12-26 DIAGNOSIS — R45851 Suicidal ideations: Secondary | ICD-10-CM | POA: Diagnosis not present

## 2014-12-26 DIAGNOSIS — E10329 Type 1 diabetes mellitus with mild nonproliferative diabetic retinopathy without macular edema: Secondary | ICD-10-CM

## 2014-12-26 DIAGNOSIS — R000 Tachycardia, unspecified: Secondary | ICD-10-CM

## 2014-12-26 LAB — CBG MONITORING, ED
GLUCOSE-CAPILLARY: 106 mg/dL — AB (ref 70–99)
GLUCOSE-CAPILLARY: 113 mg/dL — AB (ref 70–99)
GLUCOSE-CAPILLARY: 115 mg/dL — AB (ref 70–99)
GLUCOSE-CAPILLARY: 144 mg/dL — AB (ref 70–99)
GLUCOSE-CAPILLARY: 27 mg/dL — AB (ref 70–99)
GLUCOSE-CAPILLARY: 66 mg/dL — AB (ref 70–99)
GLUCOSE-CAPILLARY: 79 mg/dL (ref 70–99)
Glucose-Capillary: 70 mg/dL (ref 70–99)
Glucose-Capillary: 31 mg/dL — CL (ref 70–99)
Glucose-Capillary: 65 mg/dL — ABNORMAL LOW (ref 70–99)
Glucose-Capillary: 68 mg/dL — ABNORMAL LOW (ref 70–99)
Glucose-Capillary: 80 mg/dL (ref 70–99)
Glucose-Capillary: 129 mg/dL — ABNORMAL HIGH (ref 70–99)

## 2014-12-26 LAB — BLOOD GAS, ARTERIAL
Acid-Base Excess: 3.2 mmol/L — ABNORMAL HIGH (ref 0.0–2.0)
Bicarbonate: 27.6 mEq/L — ABNORMAL HIGH (ref 20.0–24.0)
Drawn by: 22223
FIO2: 40 %
MECHVT: 530 mL
O2 Saturation: 98.9 %
PCO2 ART: 45.1 mmHg — AB (ref 35.0–45.0)
PEEP: 5 cmH2O
PO2 ART: 175 mmHg — AB (ref 80.0–100.0)
Patient temperature: 37
RATE: 14 resp/min
TCO2: 25.3 mmol/L (ref 0–100)
pH, Arterial: 7.403 (ref 7.350–7.450)

## 2014-12-26 LAB — GLUCOSE, CAPILLARY
GLUCOSE-CAPILLARY: 134 mg/dL — AB (ref 70–99)
GLUCOSE-CAPILLARY: 140 mg/dL — AB (ref 70–99)
GLUCOSE-CAPILLARY: 150 mg/dL — AB (ref 70–99)
GLUCOSE-CAPILLARY: 195 mg/dL — AB (ref 70–99)
GLUCOSE-CAPILLARY: 86 mg/dL (ref 70–99)
Glucose-Capillary: 109 mg/dL — ABNORMAL HIGH (ref 70–99)
Glucose-Capillary: 121 mg/dL — ABNORMAL HIGH (ref 70–99)
Glucose-Capillary: 105 mg/dL — ABNORMAL HIGH (ref 70–99)
Glucose-Capillary: 119 mg/dL — ABNORMAL HIGH (ref 70–99)
Glucose-Capillary: 179 mg/dL — ABNORMAL HIGH (ref 70–99)
Glucose-Capillary: 187 mg/dL — ABNORMAL HIGH (ref 70–99)
Glucose-Capillary: 212 mg/dL — ABNORMAL HIGH (ref 70–99)
Glucose-Capillary: 221 mg/dL — ABNORMAL HIGH (ref 70–99)
Glucose-Capillary: 312 mg/dL — ABNORMAL HIGH (ref 70–99)
Glucose-Capillary: 359 mg/dL — ABNORMAL HIGH (ref 70–99)
Glucose-Capillary: 136 mg/dL — ABNORMAL HIGH (ref 70–99)
Glucose-Capillary: 92 mg/dL (ref 70–99)
Glucose-Capillary: 54 mg/dL — ABNORMAL LOW (ref 70–99)
Glucose-Capillary: 159 mg/dL — ABNORMAL HIGH (ref 70–99)

## 2014-12-26 LAB — RAPID URINE DRUG SCREEN, HOSP PERFORMED
Amphetamines: NOT DETECTED
BENZODIAZEPINES: POSITIVE — AB
Barbiturates: NOT DETECTED
COCAINE: NOT DETECTED
OPIATES: NOT DETECTED
Tetrahydrocannabinol: NOT DETECTED

## 2014-12-26 LAB — URINALYSIS, ROUTINE W REFLEX MICROSCOPIC
BILIRUBIN URINE: NEGATIVE
Glucose, UA: 250 mg/dL — AB
Hgb urine dipstick: NEGATIVE
Ketones, ur: NEGATIVE mg/dL
LEUKOCYTES UA: NEGATIVE
Nitrite: NEGATIVE
PROTEIN: NEGATIVE mg/dL
UROBILINOGEN UA: 0.2 mg/dL (ref 0.0–1.0)
pH: 6.5 (ref 5.0–8.0)

## 2014-12-26 LAB — CBC WITH DIFFERENTIAL/PLATELET
BASOS PCT: 1 % (ref 0–1)
Basophils Absolute: 0 10*3/uL (ref 0.0–0.1)
Eosinophils Absolute: 0.3 10*3/uL (ref 0.0–0.7)
Eosinophils Relative: 4 % (ref 0–5)
HCT: 33.6 % — ABNORMAL LOW (ref 39.0–52.0)
Hemoglobin: 10.8 g/dL — ABNORMAL LOW (ref 13.0–17.0)
LYMPHS ABS: 3.5 10*3/uL (ref 0.7–4.0)
LYMPHS PCT: 50 % — AB (ref 12–46)
MCH: 28 pg (ref 26.0–34.0)
MCHC: 32.1 g/dL (ref 30.0–36.0)
MCV: 87 fL (ref 78.0–100.0)
MONO ABS: 0.6 10*3/uL (ref 0.1–1.0)
MONOS PCT: 9 % (ref 3–12)
NEUTROS PCT: 36 % — AB (ref 43–77)
Neutro Abs: 2.5 10*3/uL (ref 1.7–7.7)
PLATELETS: 391 10*3/uL (ref 150–400)
RBC: 3.86 MIL/uL — AB (ref 4.22–5.81)
RDW: 15.9 % — ABNORMAL HIGH (ref 11.5–15.5)
WBC: 6.9 10*3/uL (ref 4.0–10.5)

## 2014-12-26 LAB — COMPREHENSIVE METABOLIC PANEL
ALT: 28 U/L (ref 0–53)
AST: 24 U/L (ref 0–37)
Albumin: 3.5 g/dL (ref 3.5–5.2)
Alkaline Phosphatase: 84 U/L (ref 39–117)
Anion gap: 5 (ref 5–15)
BUN: 24 mg/dL — ABNORMAL HIGH (ref 6–23)
CO2: 30 mmol/L (ref 19–32)
CREATININE: 0.79 mg/dL (ref 0.50–1.35)
Calcium: 9.3 mg/dL (ref 8.4–10.5)
Chloride: 105 mmol/L (ref 96–112)
GFR calc non Af Amer: >90 mL/min (ref >90)
Glucose, Bld: 117 mg/dL — ABNORMAL HIGH (ref 70–99)
POTASSIUM: 3.6 mmol/L (ref 3.5–5.1)
Sodium: 140 mmol/L (ref 135–145)
Total Bilirubin: 0.3 mg/dL (ref 0.3–1.2)
Total Protein: 7.4 g/dL (ref 6.0–8.3)

## 2014-12-26 LAB — SALICYLATE LEVEL: Salicylate Lvl: <4 mg/dL (ref 2.8–20.0)

## 2014-12-26 LAB — PROTIME-INR
INR: 0.83 (ref 0.00–1.49)
PROTHROMBIN TIME: 11.5 s — AB (ref 11.6–15.2)

## 2014-12-26 LAB — ACETAMINOPHEN LEVEL

## 2014-12-26 LAB — ETHANOL: Alcohol, Ethyl (B): <5 mg/dL (ref 0–9)

## 2014-12-26 LAB — GLUCOSE, RANDOM: Glucose, Bld: 105 mg/dL — ABNORMAL HIGH (ref 70–99)

## 2014-12-26 LAB — MRSA PCR SCREENING: MRSA BY PCR: NEGATIVE

## 2014-12-26 MED ORDER — ROCURONIUM BROMIDE 50 MG/5ML IV SOLN
INTRAVENOUS | Status: AC
  Filled 2014-12-26: qty 2

## 2014-12-26 MED ORDER — INSULIN GLARGINE 100 UNIT/ML ~~LOC~~ SOLN
10.0000 [IU] | Freq: Every day | SUBCUTANEOUS | Status: DC
  Filled 2014-12-26: qty 0.1

## 2014-12-26 MED ORDER — DEXTROSE 50 % IV SOLN
INTRAVENOUS | Status: AC
Start: 2014-12-26 — End: 2014-12-26
  Administered 2014-12-26: 50 mL
  Filled 2014-12-26: qty 200

## 2014-12-26 MED ORDER — LEVALBUTEROL HCL 0.63 MG/3ML IN NEBU
INHALATION_SOLUTION | RESPIRATORY_TRACT | Status: AC
  Administered 2014-12-26: 0.63 mg
  Filled 2014-12-26: qty 3

## 2014-12-26 MED ORDER — INSULIN GLARGINE 100 UNIT/ML ~~LOC~~ SOLN
5.0000 [IU] | Freq: Every day | SUBCUTANEOUS | Status: DC
Start: 2014-12-26 — End: 2014-12-27
  Administered 2014-12-26: 5 [IU] via SUBCUTANEOUS
  Filled 2014-12-26 (×2): qty 0.05

## 2014-12-26 MED ORDER — HALOPERIDOL LACTATE 5 MG/ML IJ SOLN
2.0000 mg | Freq: Once | INTRAMUSCULAR | Status: AC
  Administered 2014-12-26: 2 mg via INTRAVENOUS
  Filled 2014-12-26: qty 1

## 2014-12-26 MED ORDER — CETYLPYRIDINIUM CHLORIDE 0.05 % MT LIQD
7.0000 mL | Freq: Four times a day (QID) | OROMUCOSAL | Status: DC
  Administered 2014-12-26 – 2014-12-29 (×8): 7 mL via OROMUCOSAL

## 2014-12-26 MED ORDER — DEXTROSE 10 % IV SOLN
INTRAVENOUS | Status: DC
  Administered 2014-12-26 (×2): via INTRAVENOUS

## 2014-12-26 MED ORDER — CHLORHEXIDINE GLUCONATE 0.12 % MT SOLN
15.0000 mL | Freq: Two times a day (BID) | OROMUCOSAL | Status: DC
  Administered 2014-12-26 – 2014-12-28 (×4): 15 mL via OROMUCOSAL
  Filled 2014-12-26 (×4): qty 15

## 2014-12-26 MED ORDER — DEXTROSE 50 % IV SOLN
1.0000 | Freq: Once | INTRAVENOUS | Status: AC
  Administered 2014-12-26: 50 mL via INTRAVENOUS

## 2014-12-26 MED ORDER — DEXTROSE 50 % IV SOLN
1.0000 | Freq: Once | INTRAVENOUS | Status: AC
Start: 2014-12-26 — End: 2014-12-26
  Administered 2014-12-26: 50 mL via INTRAVENOUS

## 2014-12-26 MED ORDER — SODIUM CHLORIDE 0.9 % IV BOLUS (SEPSIS)
1000.0000 mL | Freq: Once | INTRAVENOUS | Status: AC
  Administered 2014-12-26: 1000 mL via INTRAVENOUS

## 2014-12-26 MED ORDER — SUCCINYLCHOLINE CHLORIDE 20 MG/ML IJ SOLN
INTRAMUSCULAR | Status: AC
  Administered 2014-12-26: 100 mg
  Filled 2014-12-26: qty 1

## 2014-12-26 MED ORDER — IPRATROPIUM-ALBUTEROL 0.5-2.5 (3) MG/3ML IN SOLN: 3.0000 mL | RESPIRATORY_TRACT | Status: DC

## 2014-12-26 MED ORDER — ENOXAPARIN SODIUM 40 MG/0.4ML ~~LOC~~ SOLN
40.0000 mg | SUBCUTANEOUS | Status: DC
  Administered 2014-12-26 – 2014-12-27 (×2): 40 mg via SUBCUTANEOUS
  Filled 2014-12-26 (×4): qty 0.4

## 2014-12-26 MED ORDER — PROPOFOL 10 MG/ML IV EMUL
5.0000 ug/kg/min | INTRAVENOUS | Status: DC
  Administered 2014-12-26: 22.282 ug/kg/min via INTRAVENOUS
  Filled 2014-12-26: qty 100

## 2014-12-26 MED ORDER — ETOMIDATE 2 MG/ML IV SOLN
INTRAVENOUS | Status: AC
  Administered 2014-12-26: 30 mg
  Filled 2014-12-26: qty 20

## 2014-12-26 MED ORDER — DEXTROSE 10 % IV SOLN
INTRAVENOUS | Status: DC
  Administered 2014-12-26: 01:00:00 via INTRAVENOUS

## 2014-12-26 MED ORDER — SODIUM CHLORIDE 0.9 % IV SOLN
INTRAVENOUS | Status: DC
  Administered 2014-12-26 – 2014-12-27 (×2): via INTRAVENOUS

## 2014-12-26 MED ORDER — HALOPERIDOL 2 MG PO TABS: 2.0000 mg | ORAL_TABLET | Freq: Four times a day (QID) | ORAL | Status: DC | PRN

## 2014-12-26 MED ORDER — PANTOPRAZOLE SODIUM 40 MG IV SOLR
40.0000 mg | INTRAVENOUS | Status: DC
  Administered 2014-12-26 – 2014-12-28 (×3): 40 mg via INTRAVENOUS
  Filled 2014-12-26 (×3): qty 40

## 2014-12-26 MED ORDER — DEXTROSE 50 % IV SOLN
INTRAVENOUS | Status: AC
  Administered 2014-12-26: 100 mL
  Filled 2014-12-26: qty 200

## 2014-12-26 MED ORDER — ONDANSETRON HCL 4 MG/2ML IJ SOLN: 4.0000 mg | Freq: Four times a day (QID) | INTRAMUSCULAR | Status: DC | PRN

## 2014-12-26 MED ORDER — LIDOCAINE HCL (CARDIAC) 20 MG/ML IV SOLN
INTRAVENOUS | Status: AC
  Filled 2014-12-26: qty 5

## 2014-12-26 MED ORDER — PROPOFOL 10 MG/ML IV EMUL
5.0000 ug/kg/min | INTRAVENOUS | Status: DC
  Administered 2014-12-26: 60.16 ug/kg/min via INTRAVENOUS
  Administered 2014-12-26: 65.285 ug/kg/min via INTRAVENOUS
  Administered 2014-12-26: 55 ug/kg/min via INTRAVENOUS
  Administered 2014-12-26: 50 ug/kg/min via INTRAVENOUS
  Administered 2014-12-26: 65 ug/kg/min via INTRAVENOUS
  Administered 2014-12-27: 55 ug/kg/min via INTRAVENOUS
  Filled 2014-12-26 (×3): qty 100
  Filled 2014-12-26: qty 300

## 2014-12-26 MED ORDER — DEXTROSE-NACL 5-0.45 % IV SOLN
INTRAVENOUS | Status: DC
  Administered 2014-12-26: 13:00:00 via INTRAVENOUS

## 2014-12-26 MED ORDER — INSULIN ASPART 100 UNIT/ML ~~LOC~~ SOLN: 0.0000 [IU] | Freq: Four times a day (QID) | SUBCUTANEOUS | Status: DC

## 2014-12-26 MED ORDER — ONDANSETRON HCL 4 MG PO TABS: 4.0000 mg | ORAL_TABLET | Freq: Four times a day (QID) | ORAL | Status: DC | PRN

## 2014-12-26 MED ORDER — DEXTROSE 50 % IV SOLN
INTRAVENOUS | Status: AC
  Administered 2014-12-26: 50 mL
  Filled 2014-12-26: qty 50

## 2014-12-26 MED ORDER — MORPHINE SULFATE 4 MG/ML IJ SOLN: 4.0000 mg | INTRAMUSCULAR | Status: DC | PRN

## 2014-12-26 MED ORDER — INSULIN ASPART 100 UNIT/ML ~~LOC~~ SOLN
0.0000 [IU] | Freq: Four times a day (QID) | SUBCUTANEOUS | Status: DC
Start: 2014-12-26 — End: 2014-12-28
  Administered 2014-12-26: 2 [IU] via SUBCUTANEOUS
  Administered 2014-12-26: 9 [IU] via SUBCUTANEOUS
  Administered 2014-12-27: 2 [IU] via SUBCUTANEOUS
  Administered 2014-12-27: 7 [IU] via SUBCUTANEOUS
  Administered 2014-12-28: 5 [IU] via SUBCUTANEOUS

## 2014-12-26 MED ORDER — LEVALBUTEROL HCL 0.63 MG/3ML IN NEBU
0.6300 mg | INHALATION_SOLUTION | Freq: Four times a day (QID) | RESPIRATORY_TRACT | Status: DC
  Administered 2014-12-26 – 2014-12-27 (×5): 0.63 mg via RESPIRATORY_TRACT
  Filled 2014-12-26 (×6): qty 3

## 2014-12-26 MED ORDER — LORAZEPAM 2 MG/ML IJ SOLN
2.0000 mg | INTRAMUSCULAR | Status: DC | PRN
  Administered 2014-12-26 – 2014-12-27 (×3): 2 mg via INTRAVENOUS
  Filled 2014-12-26 (×3): qty 1

## 2014-12-26 NOTE — Progress Notes (Signed)
eLink Physician-Brief Progress Note Patient Name: Tyler Mann DOB: 05-16-1981 MRN: [id number]   Date of Service  12/26/2014  HPI/Events of Note  [age] M with h/o of substance abuse including heroin presenting with AMS and hypoglycemia following intentional OD of xanax and 300 units of insulin.  He has received dosing of D50 for blood sugars in the 30s and was intubated for airway protection.  He is HD stable with sats of 100%.  No major acid/base disorder currently.  eICU Interventions  Plan per primary team Will need close monitoring of blood sugars - may need to consider D10 infusion Have added protonix for stress ulcer propy while intubated F/U PCXR s/p intubation     Intervention Category Evaluation Type: New Patient Evaluation  DETERDING,ELIZABETH 12/26/2014, 4:21 AM

## 2014-12-26 NOTE — ED Provider Notes (Signed)
TIME SEEN: 12:45 AM  CHIEF COMPLAINT: Intentional overdose  HPI: Pt is a 34 y.o. male with history of insulin-dependent diabetes, narcotic addiction who presents the emergency department with an intentional overdose. Unfortunately we are not able to obtain details from the patient. His family reports that he text a friend from his mother's phone stating that he took 20 Xanax tablets and 300 units of insulin in order to kill himself. We are not sure what time this text was sent. It appears patient also has access to [facility] which is an SNRI, trazadone.    ROS: Level V caveat for altered mental status  PAST MEDICAL HISTORY/PAST SURGICAL HISTORY:  Past Medical History  Diagnosis Date  . Diabetes mellitus   . Neuropathy   . Narcotic addiction     Hx IV heroin abuse  . Chronic back pain     MEDICATIONS:  Prior to Admission medications   Medication Sig Start Date End Date Taking? Authorizing Provider  ALPRAZolam Prudy Feeler) 0.5 MG tablet Take 1 tablet (0.5 mg total) by mouth 3 (three) times daily as needed for anxiety. 12/13/14   Fransisca Kaufmann, NP  cyclobenzaprine (FLEXERIL) 10 MG tablet Take 1 tablet (10 mg total) by mouth every 8 (eight) hours as needed. 12/13/14   Fransisca Kaufmann, NP  insulin aspart (NOVOLOG) 100 UNIT/ML injection Please continue her sliding scale as you used to take at home 12/13/14   Fransisca Kaufmann, NP  insulin aspart (NOVOLOG) 100 UNIT/ML injection Inject 10 Units into the skin 3 (three) times daily with meals. 12/13/14   Fransisca Kaufmann, NP  insulin detemir (LEVEMIR) 100 UNIT/ML injection Inject 0.29 mLs (29 Units total) into the skin at bedtime. 12/13/14   Fransisca Kaufmann, NP  meloxicam (MOBIC) 15 MG tablet Take 1 tablet (15 mg total) by mouth daily. 12/13/14   Fransisca Kaufmann, NP  Milnacipran (SAVELLA) 50 MG TABS tablet Take 1 tablet (50 mg total) by mouth 2 (two) times daily. 12/13/14   Fransisca Kaufmann, NP  traZODone (DESYREL) 50 MG tablet Take 1 tablet (50 mg total) by mouth at bedtime and may repeat  dose one time if needed. 12/13/14   Fransisca Kaufmann, NP    ALLERGIES:  No Known Allergies  SOCIAL HISTORY:  History  Substance Use Topics  . Smoking status: Current Every Day Smoker -- 1.00 packs/day for 17 years    Types: Cigarettes  . Smokeless tobacco: Current User  . Alcohol Use: No    FAMILY HISTORY: History reviewed. No pertinent family history.  EXAM: BP 146/83 mmHg  Pulse 144  Resp 16  Ht  (1.753 m)  Wt 165 lb (74.844 kg)  BMI 24.36 kg/m2  SpO2 97% CONSTITUTIONAL: Patient is awake and protecting his airway, is able to open his eyes and move all 4 extremities but is not answering questions appropriately or following commands, patient will moan; GCS 10-11 HEAD: Normocephalic EYES: Conjunctivae clear, PERRL; keeps his eyes partially open ENT: normal nose; no rhinorrhea; moist mucous membranes; pharynx without lesions noted NECK: Supple, no meningismus, no LAD  CARD: Regular and tachycardic; S1 and S2 appreciated; no murmurs, no clicks, no rubs, no gallops RESP: Normal chest excursion without splinting; breath sounds clear and equal bilaterally; no wheezes, no rhonchi, no rales, no hypoxia or respiratory distress but patient is tachypneic ABD/GI: Normal bowel sounds; non-distended; soft, non-tender, no rebound, no guarding BACK:  The back appears normal and is non-tender to palpation, there is no CVA tenderness EXT: Normal ROM in all joints; non-tender  to palpation; no edema; normal capillary refill; no cyanosis    SKIN: Normal color for age and race; warm NEURO: Moves all extremities equally, patient is protecting his airway, will open his eyes spontaneously and moan but not answer questions or follow commands   MEDICAL DECISION MAKING: Pt here with intentional overdose. We have given multiple rounds of D50 to keep his blood sugar elevated and he is on a dextrose 10% drip currently at 100 mL per hour. Have discussed with poison control as well. They will follow along.  Will obtain other tox labs, urine. EKG shows no interval changes. He is tachycardic but otherwise hemodynamically stable. No signs of trauma on exam.  Will place Foley catheter. Will give patient a small dose of IV Haldol given he is agitated. Will place in restraints.  ED PROGRESS: Patient's ethanol level is negative. Tylenol salicylate also normal. Drug screen is positive for benzodiazepines. Otherwise labs unremarkable. Patient's dextrose drip has been increased to 200 mL/h Discussed with Dr. Onalee Hua with hospitalist service who agrees with admission to ICU. Patient is still protecting his airway but will closely monitor this to see if he needs to be intubated given his altered mental status.  Family not at bedside but have talked to nursing staff earlier. They report that patient was recently admitted for psychiatric treatment.    2:30 AM  Patient's mental status is unchanged. GCS 10-11 still.  Protecting airway.  Patient is still agitated and requiring restraints. We'll give another dose of Haldol. Continue to closely monitor. His dextrose drip is now at 500 mL/h.  Awaiting ICU bed.   3:30 AM  Pt appeared to be having short episodes of apnea where his sats would drop to 90% on room air. Given this and his history of double benzodiazepine overdose we have decided to intubate the patient for airway protection. Intubated without difficulty. Will start on propofol drip. Hospitalist at bedside.    EKG Interpretation  Date/Time:  Sunday December 26 2014 00:32:24 EST Ventricular Rate:  141 PR Interval:  75 QRS Duration: 91 QT Interval:  358 QTC Calculation: 548 R Axis:   71 Text Interpretation:  Sinus tachycardia LAE, consider biatrial enlargement LVH with secondary repolarization abnormality Prolonged QT interval No significant change since last tracing Confirmed by Evah Rashid,  DO, Rana Adorno ([id number]) on 12/26/2014 1:34:50 AM          CRITICAL CARE Performed by: Raelyn Number   Total critical  care time: 60 minutes  Critical care time was exclusive of separately billable procedures and treating other patients.  Critical care was necessary to treat or prevent imminent or life-threatening deterioration.  Critical care was time spent personally by me on the following activities: development of treatment plan with patient and/or surrogate as well as nursing, discussions with consultants, evaluation of patient's response to treatment, examination of patient, obtaining history from patient or surrogate, ordering and performing treatments and interventions, ordering and review of laboratory studies, ordering and review of radiographic studies, pulse oximetry and re-evaluation of patient's condition.     INTUBATION Performed by: Raelyn Number  Required items: required blood products, implants, devices, and special equipment available Patient identity confirmed: provided demographic data and hospital-assigned identification number Time out: Immediately prior to procedure a "time out" was called to verify the correct patient, procedure, equipment, support staff and site/side marked as required.  Indications: Airway protection, overdose   Intubation method: 3.0 MAC  Preoxygenation: BVM  Sedatives: 30 mg IV Etomidate Paralytic: 100 mg  IV Succinylcholine  Tube Size: 7.5 cuffed  Post-procedure assessment: chest rise and ETCO2 monitor Breath sounds: equal and absent over the epigastrium Tube secured with: ETT holder Chest x-ray interpreted by radiologist and me.  Chest x-ray findings: endotracheal tube in appropriate position  Patient tolerated the procedure well with no immediate complications.     Layla MawKristen N Dexter Sauser, DO 12/26/14 [id number]

## 2014-12-26 NOTE — Progress Notes (Signed)
cbg of 54 followed hypoglycemic protocol. Repeat cbg of 84. Following cbg at 2200 is 134

## 2014-12-26 NOTE — ED Notes (Signed)
Patients father Tyler LivingsJohn Mann [phone]

## 2014-12-26 NOTE — ED Notes (Signed)
Patient's parents at bedside.

## 2014-12-26 NOTE — H&P (Addendum)
PCP:   Catalina PizzaHALL, ZACH, MD   Chief Complaint:  overdose  HPI: 34 yo male type 1 dm, drug abuse comes in with ems after ingesting 20 of his xanax pills, and report of 300 units of his insulin.  Ems arrived at his house around 1120pm and he was alert and mentating well then.  He has both long acting and short acting insulin at home, and unclear which he took tonight.  This was an intentional overdose.  Pt has declined in the ED since arrival and has been intubated for airway protection and because his oxygen sats were starting to decline.  He is currently on d10 gtt and getting an amp of d50 about every 30 minutes.  He is sedated and intubated.  Review of Systems:  Unobtainable  Past Medical History: Past Medical History  Diagnosis Date  . Diabetes mellitus   . Neuropathy   . Narcotic addiction     Hx IV heroin abuse  . Chronic back pain    Past Surgical History  Procedure Laterality Date  . Mouth surgery      Medications: Prior to Admission medications   Medication Sig Start Date End Date Taking? Authorizing Provider  ALPRAZolam Prudy Feeler(XANAX) 0.5 MG tablet Take 1 tablet (0.5 mg total) by mouth 3 (three) times daily as needed for anxiety. 12/13/14   Fransisca KaufmannLaura Davis, NP  cyclobenzaprine (FLEXERIL) 10 MG tablet Take 1 tablet (10 mg total) by mouth every 8 (eight) hours as needed. 12/13/14   Fransisca KaufmannLaura Davis, NP  insulin aspart (NOVOLOG) 100 UNIT/ML injection Please continue her sliding scale as you used to take at home 12/13/14   Fransisca KaufmannLaura Davis, NP  insulin aspart (NOVOLOG) 100 UNIT/ML injection Inject 10 Units into the skin 3 (three) times daily with meals. 12/13/14   Fransisca KaufmannLaura Davis, NP  insulin detemir (LEVEMIR) 100 UNIT/ML injection Inject 0.29 mLs (29 Units total) into the skin at bedtime. 12/13/14   Fransisca KaufmannLaura Davis, NP  meloxicam (MOBIC) 15 MG tablet Take 1 tablet (15 mg total) by mouth daily. 12/13/14   Fransisca KaufmannLaura Davis, NP  Milnacipran (SAVELLA) 50 MG TABS tablet Take 1 tablet (50 mg total) by mouth 2 (two) times  daily. 12/13/14   Fransisca KaufmannLaura Davis, NP  traZODone (DESYREL) 50 MG tablet Take 1 tablet (50 mg total) by mouth at bedtime and may repeat dose one time if needed. 12/13/14   Fransisca KaufmannLaura Davis, NP    Allergies:  No Known Allergies  Social History:  reports that he has been smoking Cigarettes.  He has a 17 pack-year smoking history. He uses smokeless tobacco. He reports that he uses illicit drugs (Methamphetamines and Heroin). He reports that he does not drink alcohol.  Family History: History reviewed. No pertinent family history.  Physical Exam: Filed Vitals:   12/26/14 0030 12/26/14 0100 12/26/14 0130 12/26/14 0200  BP: 146/83 161/109 170/88 162/88  Pulse: 145 130 133 128  Temp:  96.8 F (36 C) 97.1 F (36.2 C) 97.4 F (36.3 C)  Resp: 35 36 31 32  Height:      Weight:      SpO2: 97% 97% 98% 95%   General appearance: no distress intubated and sedated Head: Normocephalic, without obvious abnormality, atraumatic Eyes: negative Nose: Nares normal. Septum midline. Mucosa normal. No drainage or sinus tenderness. Neck: no JVD and supple, symmetrical, trachea midline Lungs: clear to auscultation bilaterally Heart: regular rate and rhythm, S1, S2 normal, no murmur, click, rub or gallop Abdomen: soft, non-tender; bowel sounds normal; no masses,  no organomegaly Extremities: extremities normal, atraumatic, no cyanosis or edema Pulses: 2+ and symmetric Skin: Skin color, texture, turgor normal. No rashes or lesions Neurologic: sedated.  MAE.    Labs on Admission:   Recent Labs  12/26/14 0047  NA 140  K 3.6  CL 105  CO2 30  GLUCOSE 117*  BUN 24*  CREATININE 0.79  CALCIUM 9.3    Recent Labs  12/26/14 0047  AST 24  ALT 28  ALKPHOS 84  BILITOT 0.3  PROT 7.4  ALBUMIN 3.5    Recent Labs  12/26/14 0047  WBC 6.9  NEUTROABS 2.5  HGB 10.8*  HCT 33.6*  MCV 87.0  PLT 391   Radiological Exams on Admission: Dg Chest Port 1 View  12/04/2014   CLINICAL DATA:  Acute cough.  Leukocytosis. Dehydration. Current history of diabetes.  EXAM: PORTABLE CHEST - 1 VIEW  COMPARISON:  11/02/2014 dating back to 06/19/2010.  FINDINGS: Suboptimal inspiration accounts for crowded bronchovascular markings diffusely and atelectasis in the bases, and accentuates the cardiac silhouette. Taking this into account, cardiomediastinal silhouette unremarkable and unchanged. Lungs otherwise clear. No localized airspace consolidation. No pleural effusions. No pneumothorax. Normal pulmonary vascularity.  IMPRESSION: Suboptimal inspiration accounts for bibasilar atelectasis. No acute cardiopulmonary disease otherwise.   Electronically Signed   By: Hulan Saas M.D.   On: 12/04/2014 11:42    Assessment/Plan  34 yo male with intentional overdose of xanax and insulin  Principal Problem:   Drug overdose, intentional requiring intubation-  Cont vent support mainly because of the benzodiazapine overdose for airway protection.  Will need psych eval once medically cleared.  Propofol gtt ordered along with prn doses of morphine, ativan and haldol.  With the amount of xanax he has taken it will likely be 48 hours before we can extubate.  Mechanical vent order pathway utilized.  Active Problems:   Diabetes mellitus type 1-  Noted, hold insulin   Chronic pain-  noted   Generalized anxiety disorder-  noted   Insulin overdose now with severe hypoglycemia-  q 30 min glucose checks, d10 gtt titrated up with amps of d50 prn glucose lower than 80.  He could have taken a combination of both types of insulin he has available at home.    DVT prophylaxis -  lovenox    Admit to icu.  Full code.   Sausha Raymond A 12/26/2014, 2:19 AM Cc time 45 minutes

## 2014-12-26 NOTE — Progress Notes (Signed)
PROGRESS NOTE  Tyler Mann HQI:[id number] DOB: Sep 04, 1981 DOA: 12/26/2014 PCP: Catalina Pizza, MD  Summary: 34 year old man with diabetes mellitus type 1, history of hair and abuse presented with intentional overdose of 20 pills of Xanax, 300 units of unknown type of insulin. He declined in the emergency department was intubated for airway protection and required high doses of dextrose to maintain his blood sugar.  Assessment/Plan: 1. Intentional overdose with Xanax and insulin. Suspect suicide attempt. Also has access to trazodone and SNRI Savella but no hx to suggest ingestion. Tylenol, salicylate, ethanol levels unremarkable. Drug screen positive for benzodiazepines. 2. Severe hypoglycemia secondary to overdose on insulin, stableunaggressive D 10. 3. Diabetes mellitus type 1 uncontrolled by hemoglobin A1c10.4 4. Normocytic anemia, suspect anemia of chronic disease, appears at baseline 5. Acute encephalopathy with agitation requiring restraints and intubation for airway protection.  6. Prolonged QT 7. Narcotic addiction, heroin abuse 8. Hospitalized 11/2014 for DKA, also noted to have amphetamine and heroin substance abuse. Suicidal ideations were expressed and he was admitted for psychiatric evaluation.   Hemodynamics are stable, tolerating vent well with good oxygenation. Telemetry stable. Plan to continue ventilator support today given significant amount of benzodiazepine ingested, consider weaning trial and extubation 2/8.  Decrease D5 infusion, monitor CBG q1  Code Status: full code DVT prophylaxis: Lovenox Family Communication: none present Disposition Plan: pending medical stability >> psych consult  Brendia Sacks, MD  [facility]  Pager [phone] If 7PM-7AM, please contact night-coverage at [email or web address], password [facility] 12/26/2014, 8:24 AM  LOS: 0 days   Consultants:  Pulmonology not available until 2/8  Procedures:  ETT 2/7 >>  HPI/Subjective: Intubated and  sedated.  Objective: Filed Vitals:   12/26/14 0715 12/26/14 0730 12/26/14 0745 12/26/14 0800  BP: 130/78 126/67 127/71 124/72  Pulse: 116 114 113 111  Temp:      TempSrc:      Resp: Height:      Weight:      SpO2: 100% 100% 100% 100%    Intake/Output Summary (Last 24 hours) at 12/26/14 0824 Last data filed at 12/26/14 0600  Gross per 24 hour  Intake      0 ml  Output   4125 ml  Net  -4125 ml     Filed Weights   12/26/14 0029 12/26/14 0415  Weight: 74.844 kg (165 lb) 73.3 kg (161 lb 9.6 oz)    Exam:     Afebrile, vital signs are stable, mild tachycardia. 100% on FiO2 40%. PEEP 5. Tolerating vent. General:  Appears calm and comfortable Eyes: pupils appear unremarkable, normal lids, irises ENT: appears grossly unremarkable, exam limited. Cardiovascular: RRR, no m/r/g. No LE edema. Telemetry: ST, no arrhythmias  Respiratory: CTA bilaterally, no w/r/r. Tolerating vent. Abdomen: soft, ntnd Skin: no rash or induration seen  Musculoskeletal: cannot assess Psychiatric: cannot assess Neurologic: cannot assess  Data Reviewed:  Urine output 4125  Blood sugars stable with last episode of hypoglycemia 0315 this a.m.  ABG 7.4/45/175  Complete metabolic panel was unremarkable  Hemoglobin was stable at 10.8 which is at baseline   Tylenol, salicylate, alcohol levels negative  Urinalysis negative  Chest x-ray with bibasilar atelectasis  EKG with LVH, prolonged QT  Scheduled Meds: . antiseptic oral rinse  7 mL Mouth Rinse QID  . chlorhexidine  15 mL Mouth Rinse BID  . enoxaparin (LOVENOX) injection  40 mg Subcutaneous Q24H  . levalbuterol  0.63 mg Nebulization Q6H  . lidocaine (cardiac) 100 mg/31ml      .  pantoprazole (PROTONIX) IV  40 mg Intravenous Q24H  . rocuronium       Continuous Infusions: . dextrose 300 mL/hr at 12/26/14 0531  . propofol 65 mcg/kg/min (12/26/14 0645)    Principal Problem:   Drug overdose, intentional Active Problems:    Diabetes mellitus type 1   Chronic pain   Generalized anxiety disorder   Hypoglycemia   Time spent 25 minutes

## 2014-12-26 NOTE — ED Notes (Signed)
Patient intubated at this time 7.5 ETT 23 at the teeth

## 2014-12-26 NOTE — ED Notes (Signed)
Patient in via [facility] with a possible overdose of xanax and insulin. Patient initial cbg via [facility] was 37-1amp D50% given-last cbg was 207. Per family patient took an unknown amount of xanax. Per EMS patient took 300Units of insulin. Patient is lethargic and semi-combative upon arrival. [facility] at bedside. Per [facility]-patient sent a text message stating he took 20 xanax.

## 2014-12-26 NOTE — ED Notes (Signed)
Etomidate 30 and succs 100 given at this time

## 2014-12-27 LAB — GLUCOSE, CAPILLARY
GLUCOSE-CAPILLARY: 104 mg/dL — AB (ref 70–99)
GLUCOSE-CAPILLARY: 129 mg/dL — AB (ref 70–99)
GLUCOSE-CAPILLARY: 209 mg/dL — AB (ref 70–99)
Glucose-Capillary: 114 mg/dL — ABNORMAL HIGH (ref 70–99)
Glucose-Capillary: 71 mg/dL (ref 70–99)
Glucose-Capillary: 139 mg/dL — ABNORMAL HIGH (ref 70–99)
Glucose-Capillary: 169 mg/dL — ABNORMAL HIGH (ref 70–99)

## 2014-12-27 LAB — CBC
HCT: 34.7 % — ABNORMAL LOW (ref 39.0–52.0)
Hemoglobin: 10.6 g/dL — ABNORMAL LOW (ref 13.0–17.0)
MCH: 27.5 pg (ref 26.0–34.0)
MCHC: 30.5 g/dL (ref 30.0–36.0)
MCV: 90.1 fL (ref 78.0–100.0)
Platelets: 389 10*3/uL (ref 150–400)
RBC: 3.85 MIL/uL — AB (ref 4.22–5.81)
RDW: 16.9 % — ABNORMAL HIGH (ref 11.5–15.5)
WBC: 6.8 10*3/uL (ref 4.0–10.5)

## 2014-12-27 LAB — BLOOD GAS, ARTERIAL
ACID-BASE DEFICIT: 0.2 mmol/L (ref 0.0–2.0)
BICARBONATE: 24.2 meq/L — AB (ref 20.0–24.0)
DRAWN BY: 234301
FIO2: 40 %
Mode: POSITIVE
O2 SAT: 98.9 %
PATIENT TEMPERATURE: 37
PCO2 ART: 41.3 mmHg (ref 35.0–45.0)
PEEP: 5 cmH2O
PRESSURE SUPPORT: 5 cmH2O
TCO2: 22.2 mmol/L (ref 0–100)
pH, Arterial: 7.385 (ref 7.350–7.450)
pO2, Arterial: 167 mmHg — ABNORMAL HIGH (ref 80.0–100.0)

## 2014-12-27 LAB — BASIC METABOLIC PANEL
Anion gap: 8 (ref 5–15)
BUN: 7 mg/dL (ref 6–23)
CO2: 26 mmol/L (ref 19–32)
Calcium: 8.6 mg/dL (ref 8.4–10.5)
Chloride: 106 mmol/L (ref 96–112)
Creatinine, Ser: 0.69 mg/dL (ref 0.50–1.35)
GFR calc non Af Amer: >90 mL/min (ref >90)
Glucose, Bld: 143 mg/dL — ABNORMAL HIGH (ref 70–99)
POTASSIUM: 3.9 mmol/L (ref 3.5–5.1)
Sodium: 140 mmol/L (ref 135–145)

## 2014-12-27 LAB — TRIGLYCERIDES: Triglycerides: 260 mg/dL — ABNORMAL HIGH (ref <150)

## 2014-12-27 MED ORDER — ACETAMINOPHEN 325 MG PO TABS
650.0000 mg | ORAL_TABLET | Freq: Four times a day (QID) | ORAL | Status: DC | PRN
  Administered 2014-12-27 – 2014-12-30 (×5): 650 mg via ORAL
  Filled 2014-12-27 (×4): qty 2

## 2014-12-27 MED ORDER — LEVALBUTEROL HCL 0.63 MG/3ML IN NEBU: 0.6300 mg | INHALATION_SOLUTION | Freq: Four times a day (QID) | RESPIRATORY_TRACT | Status: DC | PRN

## 2014-12-27 MED ORDER — INSULIN DETEMIR 100 UNIT/ML ~~LOC~~ SOLN
15.0000 [IU] | Freq: Every day | SUBCUTANEOUS | Status: DC
  Administered 2014-12-27 – 2014-12-29 (×3): 15 [IU] via SUBCUTANEOUS
  Filled 2014-12-27 (×4): qty 0.15

## 2014-12-27 NOTE — Procedures (Signed)
Extubation Procedure Note  Patient Details:   Name: Tyler Mann E Gearheart DOB: 05/02/1981 MRN: [id number]   Airway Documentation:    RT performed weaning parameters on patient. Patient NIF -20, FVC 2L and ABG within normal limits. Results called MD, RT received orders to extubate. RT extubated to 3L O2, SATs 100%, HR 105 and RR 24. RN at bedside, no complications noted. RN will continue to monitor. Evaluation  O2 sats: stable throughout Complications: No apparent complications Patient did tolerate procedure well. Bilateral Breath Sounds: Diminished   Yes  Cloretta NedBroadnax, Taggert Bozzi Ann 12/27/2014, 10:12 AM

## 2014-12-27 NOTE — Progress Notes (Signed)
Spoke with mother, Agustin CreeDarlene, and father, Jonny RuizJohn regarding their concerns for their son. They asked to speak away from the patient. This nurse explained that I could not talk about specific issues on my end regarding the patient, and they were in agreement. Stated that they were afraid that he would lie about events to the psychologist. Agustin CreeDarlene showed this RN text messages from her phone to a friend of his stating that he had taken 350 units of insulin, 20 Xanax's, and drank "about a 12 pack". Darlene and Jonny RuizJohn also stated that this patient posted on social media that he loves everyone and will see them on the other side. Told parents that they were welcome to talk and visit with patient. Parents at the bedside with suicide sitter. Will continue to monitor.

## 2014-12-27 NOTE — Consult Note (Signed)
Consult requested by: Dr. Irene Limbo Consult requested for respiratory failure:  HPI: This is a 34 year old who apparently took an intentional overdose of insulin and Xanax. He was intubated for airway protection because he had declining mental status. He has been receiving 10% dextrose and  requiring D50 fairly frequently. The last several blood sugars have been ranging between 129 and 169. He is sedated so I can't really assess his mental status at this time.  Past Medical History  Diagnosis Date  . Diabetes mellitus   . Neuropathy   . Narcotic addiction     Hx IV heroin abuse  . Chronic back pain      History reviewed. No pertinent family history.   History   Social History  . Marital Status: Single    Spouse Name: N/A    Number of Children: N/A  . Years of Education: N/A   Social History Main Topics  . Smoking status: Current Every Day Smoker -- 1.00 packs/day for 17 years    Types: Cigarettes  . Smokeless tobacco: Current User  . Alcohol Use: No  . Drug Use: Yes    Special: Methamphetamines, Heroin     Comment: recovering from heroin-   . Sexual Activity: Yes   Other Topics Concern  . None   Social History Narrative     ROS: Unobtainable    Objective: Vital signs in last 24 hours: Temp:  [97.7 F (36.5 C)-98.3 F (36.8 C)] 98.1 F (36.7 C) (02/08 0400) Pulse Rate:  [97-113] 102 (02/08 0545) Resp:  [15-22] 16 (02/08 0545) BP: (108-143)/(57-98) 109/83 mmHg (02/08 0545) SpO2:  [98 %-100 %] 100 % (02/08 0737) FiO2 (%):  [28 %-40 %] 28 % (02/08 0737) Weight:  [71.6 kg (157 lb 13.6 oz)] 71.6 kg (157 lb 13.6 oz) (02/08 0500) Weight change: -3.244 kg (-7 lb 2.4 oz) Last BM Date:  (unknown)  Intake/Output from previous day: 02/07 0701 - 02/08 0700 In: 3387.2 [I.V.:3387.2] Out: 3750 [Urine:3750]  PHYSICAL EXAM He is intubated and sedated. His pupils react. Mucous membranes are slightly dry. His neck is supple without masses. His chest is relatively clear.  His heart is regular without gallop. Abdomen is soft no masses are felt bowel sounds are present and active extremity showed no edema I can't really assess his central nervous system examination  Lab Results: Basic Metabolic Panel:  Recent Labs  16/10/96 0047 12/26/14 0439 12/27/14 0500  NA 140  --  140  K 3.6  --  3.9  CL 105  --  106  CO2 30  --  26  GLUCOSE 117* 105* 143*  BUN 24*  --  7  CREATININE 0.79  --  0.69  CALCIUM 9.3  --  8.6   Liver Function Tests:  Recent Labs  12/26/14 0047  AST 24  ALT 28  ALKPHOS 84  BILITOT 0.3  PROT 7.4  ALBUMIN 3.5   No results for input(s): LIPASE, AMYLASE in the last 72 hours. No results for input(s): AMMONIA in the last 72 hours. CBC:  Recent Labs  12/26/14 0047 12/27/14 0500  WBC 6.9 6.8  NEUTROABS 2.5  --   HGB 10.8* 10.6*  HCT 33.6* 34.7*  MCV 87.0 90.1  PLT 391 389   Cardiac Enzymes: No results for input(s): CKTOTAL, CKMB, CKMBINDEX, TROPONINI in the last 72 hours. BNP: No results for input(s): PROBNP in the last 72 hours. D-Dimer: No results for input(s): DDIMER in the last 72 hours. CBG:  Recent Labs  12/27/14 0058 12/27/14 0207 12/27/14 0307 12/27/14 0430 12/27/14 0524 12/27/14 0634  GLUCAP 114* 71 104* 129* 139* 169*   Hemoglobin A1C: No results for input(s): HGBA1C in the last 72 hours. Fasting Lipid Panel:  Recent Labs  12/27/14 0500  TRIG 260*   Thyroid Function Tests: No results for input(s): TSH, T4TOTAL, FREET4, T3FREE, THYROIDAB in the last 72 hours. Anemia Panel: No results for input(s): VITAMINB12, FOLATE, FERRITIN, TIBC, IRON, RETICCTPCT in the last 72 hours. Coagulation:  Recent Labs  12/26/14 0412  LABPROT 11.5*  INR 0.83   Urine Drug Screen: Drugs of Abuse     Component Value Date/Time   LABOPIA NONE DETECTED 12/26/2014 0119   COCAINSCRNUR NONE DETECTED 12/26/2014 0119   LABBENZ POSITIVE* 12/26/2014 0119   AMPHETMU NONE DETECTED 12/26/2014 0119   THCU NONE  DETECTED 12/26/2014 0119   LABBARB NONE DETECTED 12/26/2014 0119    Alcohol Level:  Recent Labs  12/26/14 0047  ETH <5   Urinalysis:  Recent Labs  12/26/14 0119  COLORURINE YELLOW  LABSPEC <1.005*  PHURINE 6.5  GLUCOSEU 250*  HGBUR NEGATIVE  BILIRUBINUR NEGATIVE  KETONESUR NEGATIVE  PROTEINUR NEGATIVE  UROBILINOGEN 0.2  NITRITE NEGATIVE  LEUKOCYTESUR NEGATIVE   Misc. Labs:   ABGS:  Recent Labs  12/26/14 0415  PHART 7.403  PO2ART 175.0*  TCO2 25.3  HCO3 27.6*     MICROBIOLOGY: Recent Results (from the past 240 hour(s))  MRSA PCR Screening     Status: None   Collection Time: 12/26/14  3:30 AM  Result Value Ref Range Status   MRSA by PCR NEGATIVE NEGATIVE Final    Comment:        The GeneXpert MRSA Assay (FDA approved for NASAL specimens only), is one component of a comprehensive MRSA colonization surveillance program. It is not intended to diagnose MRSA infection nor to guide or monitor treatment for MRSA infections.     Studies/Results: Dg Chest Port 1 View  12/26/2014   CLINICAL DATA:  Altered mental status. Possible overdose. Endotracheal tube placement.  EXAM: PORTABLE CHEST - 1 VIEW  COMPARISON:  12/04/2014  FINDINGS: The endotracheal tube is high, 7.5 cm from the carina, advancement of 2 cm recommended. Lung volumes are low. Cardiomediastinal contours are normal. Mild bibasilar atelectasis. No confluent airspace disease to suggest pneumonia. Pulmonary vasculature is normal. There is no pleural effusion or pneumothorax. No acute osseous abnormality.  IMPRESSION: 1. High positioning of endotracheal tube 7.5 cm from the carina, advancement of 2 cm recommended. 2. Low lung volumes with bibasilar atelectasis.   Electronically Signed   By: Rubye OaksMelanie  Ehinger M.D.   On: 12/26/2014 06:06    Medications:  Prior to Admission:  Prescriptions prior to admission  Medication Sig Dispense Refill Last Dose  . ALPRAZolam (XANAX) 0.5 MG tablet Take 1 tablet (0.5  mg total) by mouth 3 (three) times daily as needed for anxiety.  1 12/25/2014 at Unknown time  . cyclobenzaprine (FLEXERIL) 10 MG tablet Take 1 tablet (10 mg total) by mouth every 8 (eight) hours as needed. (Patient taking differently: Take 10 mg by mouth every 8 (eight) hours as needed for muscle spasms. ) 30 tablet 0 unknown  . insulin aspart (NOVOLOG) 100 UNIT/ML injection Inject 10 Units into the skin 3 (three) times daily with meals. (Patient taking differently: Inject 5-20 Units into the skin 3 (three) times daily with meals. Based on sliding scale.) 10 mL 11 12/25/2014 at Unknown time  . insulin detemir (LEVEMIR) 100 UNIT/ML injection Inject  0.29 mLs (29 Units total) into the skin at bedtime. 10 mL 11 12/25/2014 at Unknown time  . meloxicam (MOBIC) 15 MG tablet Take 1 tablet (15 mg total) by mouth daily. 7 tablet 0 unknown  . Milnacipran (SAVELLA) 50 MG TABS tablet Take 1 tablet (50 mg total) by mouth 2 (two) times daily. 60 tablet  Unknown  . traZODone (DESYREL) 50 MG tablet Take 1 tablet (50 mg total) by mouth at bedtime and may repeat dose one time if needed. 60 tablet 0 Unknown   Scheduled: . antiseptic oral rinse  7 mL Mouth Rinse QID  . chlorhexidine  15 mL Mouth Rinse BID  . enoxaparin (LOVENOX) injection  40 mg Subcutaneous Q24H  . insulin aspart  0-9 Units Subcutaneous Q6H  . insulin glargine  5 Units Subcutaneous Daily  . levalbuterol  0.63 mg Nebulization Q6H  . pantoprazole (PROTONIX) IV  40 mg Intravenous Q24H   Continuous: . sodium chloride 75 mL/hr at 12/27/14 0500  . propofol 55 mcg/kg/min (12/27/14 0500)   ZOX:[id number], morphine injection, ondansetron **OR** ondansetron (ZOFRAN) IV  Assesment: He had intentional drug overdose and has been intubated and placed on mechanical ventilation because of inability to protect his airway. I can't really assess his mental status now because of his sedation but he does seem to be better as far as his hypoglycemia is concerned Principal  Problem:   Drug overdose, intentional Active Problems:   Diabetes mellitus type 1   Chronic pain   Generalized anxiety disorder   Hypoglycemia    Plan: See what he does if his sedation is reduced and see if he would be a candidate for weaning/extubation    LOS: 1 day   Ekta Dancer L 12/27/2014, 7:57 AM

## 2014-12-27 NOTE — Progress Notes (Signed)
PROGRESS NOTE  Tyler Mann ZOX:[id number] DOB: July 08, 2037 DOA: 12/26/2014 PCP: Catalina PizzaHALL, ZACH, MD  Summary: 34 year old man with diabetes mellitus type 1, history of heroin abuse presented with intentional overdose of 20 pills of Xanax, 300 units of unknown type of insulin. He declined in the emergency department was intubated for airway protection and required high doses of dextrose to maintain his blood sugar. Currently his conditions improving, hypoglycemia has resolved and he will likely be extubated today. Once stable plan psychiatry consultation.  Assessment/Plan: 1. Intentional overdose with Xanax and insulin. Suspect suicide attempt. Tylenol, salicylate, ethanol levels unremarkable. Drug screen positive for benzodiazepines. 2. Severe hypoglycemia secondary to overdose on insulin, resolved. 3. Diabetes mellitus type 1 uncontrolled by hemoglobin A1c10.4. Minimal hypoglycemia last 24 hours. Probably will need to restart basal insulin today. 4. Normocytic anemia, suspect anemia of chronic disease, at baseline. 5. Acute encephalopathy with agitation requiring restraints and intubation for airway protection. Secondary to drug overdose of hypoglycemia. 6. Prolonged QT, resolved on EKG today 7. Narcotic addiction, heroin abuse 8. Hospitalized 11/2014 for DKA, also noted to have amphetamine and heroin substance abuse. Suicidal ideations were expressed and he was admitted for psychiatric evaluation.   Hemodynamics are stable, tolerating vent well with good oxygenation. Telemetry stable. Likely extubation today. Appreciate pulmonology evaluation and recommendations.  Change blood sugars to every 6. Likely advance diet later today and start basal insulin.  If remains medically stable, plan psychiatry consultation 2/9.  Code Status: full code DVT prophylaxis: Lovenox Family Communication: none present Disposition Plan: pending medical stability >> psych consult  Brendia Sacksaniel Goodrich, MD  [facility]  Pager [phone] If 7PM-7AM, please contact night-coverage at [email or web address], password [email or web address] 12/27/2014, 8:35 AM  LOS: 1 day   Consultants:  Pulmonology not available until 2/8  Procedures:  ETT 2/7 >>  HPI/Subjective: 1 episode of modest hypoglycemia overnight  He is awake but still intubated. He follows simple commands.  Objective: Filed Vitals:   12/27/14 0737 12/27/14 0745 12/27/14 0800 12/27/14 0815  BP:  118/79 121/77 114/75  Pulse:  106 103 105  Temp:      TempSrc:      Resp:  16 16 16   Height:      Weight:      SpO2: 100% 100% 99% 100%    Intake/Output Summary (Last 24 hours) at 12/27/14 0835 Last data filed at 12/27/14 0500  Gross per 24 hour  Intake 3387.17 ml  Output   2450 ml  Net 937.17 ml     Filed Weights   12/26/14 0029 12/26/14 0415 12/27/14 0500  Weight: 74.844 kg (165 lb) 73.3 kg (161 lb 9.6 oz) 71.6 kg (157 lb 13.6 oz)    Exam:     Afebrile, vital signs are stable, mild tachycardia. 100% on FiO2 28%. PEEP 5. Tolerating vent. General: Appears comfortable, calm. Eyes: Pupils, lids appear grossly unremarkable Cardiovascular: Tachycardic, regular rhythm, no murmur, rub or gallop. No lower extremity edema. Telemetry: Sinus tachycardia, no arrhythmias  Respiratory: Clear to auscultation bilaterally, no wheezes, rales or rhonchi. Normal respiratory effort. Abdomen: soft, ntnd Skin: no rash or induration noted Musculoskeletal: grossly normal tone bilateral upper and lower extremities, moves all extremities to command Psychiatric: Limited while intubated and sedated Neurologic: grossly non-focal.   Data Reviewed:  Urine output 3750  Blood sugars stable with last episode of hypoglycemia last evening.  Basic metabolic panel unremarkable, anion gap 8  Hemoglobin was stable at 10.6  Repeat EKG 2/8 shows resolution of prolonged QT  Tylenol, salicylate, alcohol levels negative  Urinalysis negative  Chest x-ray with  bibasilar atelectasis  EKG with LVH, prolonged QT  Scheduled Meds: . antiseptic oral rinse  7 mL Mouth Rinse QID  . chlorhexidine  15 mL Mouth Rinse BID  . enoxaparin (LOVENOX) injection  40 mg Subcutaneous Q24H  . insulin aspart  0-9 Units Subcutaneous Q6H  . levalbuterol  0.63 mg Nebulization Q6H  . pantoprazole (PROTONIX) IV  40 mg Intravenous Q24H   Continuous Infusions: . sodium chloride 75 mL/hr at 12/27/14 0500  . propofol 10 mcg/kg/min (12/27/14 2951)    Principal Problem:   Drug overdose, intentional Active Problems:   Diabetes mellitus type 1   Chronic pain   Generalized anxiety disorder   Hypoglycemia   Time spent 20 minutes

## 2014-12-27 NOTE — Care Management Utilization Note (Signed)
UR completed 

## 2014-12-27 NOTE — Progress Notes (Signed)
Inpatient Diabetes Program Recommendations  AACE/ADA: New Consensus Statement on Inpatient Glycemic Control (2013)  Target Ranges:  Prepandial:   less than 140 mg/dL      Peak postprandial:   less than 180 mg/dL (1-2 hours)      Critically ill patients:  140 - 180 mg/dL   Results for Tyler Mann, Tyler Mann (MRN [id number]) as of 12/27/2014 14:03  Ref. Range 12/27/2014 00:58 12/27/2014 02:07 12/27/2014 03:07 12/27/2014 04:30 12/27/2014 05:24 12/27/2014 06:34 12/27/2014 07:50  Glucose-Capillary Latest Range: 70-99 mg/dL 409114 (H) 71 811104 (H) 914129 (H) 139 (H) 169 (H) 209 (H)   Diabetes history: DM1 Outpatient Diabetes medications: Levemir 29 units QHS, Novolog 5-20 units TID with meals Current orders for Inpatient glycemic control: Novolog 0-9 units Q6H  Inpatient Diabetes Program Recommendations Insulin - Basal: Patient has Type 1 diabetes and will require basal insulin. CBGs have improved and are now stable and over 200 mg/dl. Please consider ordering Levemir 15 units Q24 starting now. Correction (SSI): Once patient begins eating, please consider changing frequency of CBGs and Novolog to ACHS. Insulin - Meal Coverage: Once patient begins eating, please consider ordering Novolog 4 units TID with meals for meal coverage if patient eats at least 50% of meal.  Thanks, Orlando PennerMarie Deepak Bless, RN, MSN, CCRN, CDE Diabetes Coordinator Inpatient Diabetes Program [phone] (Team Pager) [phone] (AP office) [phone] [facility] office)

## 2014-12-27 NOTE — Progress Notes (Signed)
Patient opened up about incident that brought him to the hospital.  Stated he did not take any insulin. When addressing his low blood pressure, he stated that happens all the time with him, he hadn't eaten.  He was "pissed off"  This patient is angry and frustrated over not being able to find work that he could make enough to live on. States he could work in [employer]. Angry at his parents limitations while living with them.  Talked with him about burning bridges, and reminded him they have always been there.  Patient is still looking for answers, but whether he would follow through if he were given any, is the issue.   Patient states he was not trying to kill himself and only took 2 xanax.  The text was for effect.  Trying to stir his girlfriend to call him.  This patient is talking to me as any other time Tyler RuizJohn has been here in the hospital.  He has remained pleasant and cooperative tonight.

## 2014-12-28 DIAGNOSIS — R45851 Suicidal ideations: Secondary | ICD-10-CM

## 2014-12-28 DIAGNOSIS — E162 Hypoglycemia, unspecified: Secondary | ICD-10-CM

## 2014-12-28 DIAGNOSIS — E108 Type 1 diabetes mellitus with unspecified complications: Secondary | ICD-10-CM

## 2014-12-28 DIAGNOSIS — F1919 Other psychoactive substance abuse with unspecified psychoactive substance-induced disorder: Secondary | ICD-10-CM

## 2014-12-28 LAB — TSH: TSH: 3.471 u[IU]/mL (ref 0.350–4.500)

## 2014-12-28 LAB — GLUCOSE, CAPILLARY
GLUCOSE-CAPILLARY: 213 mg/dL — AB (ref 70–99)
Glucose-Capillary: 204 mg/dL — ABNORMAL HIGH (ref 70–99)
Glucose-Capillary: 304 mg/dL — ABNORMAL HIGH (ref 70–99)

## 2014-12-28 MED ORDER — INSULIN ASPART 100 UNIT/ML ~~LOC~~ SOLN
0.0000 [IU] | Freq: Three times a day (TID) | SUBCUTANEOUS | Status: DC
Start: 2014-12-28 — End: 2014-12-30
  Administered 2014-12-28: 2 [IU] via SUBCUTANEOUS
  Administered 2014-12-28: 3 [IU] via SUBCUTANEOUS
  Administered 2014-12-28: 5 [IU] via SUBCUTANEOUS
  Administered 2014-12-28: 3 [IU] via SUBCUTANEOUS
  Administered 2014-12-29: 5 [IU] via SUBCUTANEOUS
  Administered 2014-12-29: 3 [IU] via SUBCUTANEOUS
  Administered 2014-12-29: 9 [IU] via SUBCUTANEOUS
  Administered 2014-12-29: 5 [IU] via SUBCUTANEOUS
  Administered 2014-12-30: 9 [IU] via SUBCUTANEOUS

## 2014-12-28 NOTE — Progress Notes (Signed)
Inpatient Diabetes Program Recommendations  AACE/ADA: New Consensus Statement on Inpatient Glycemic Control (2013)  Target Ranges:  Prepandial:   less than 140 mg/dL      Peak postprandial:   less than 180 mg/dL (1-2 hours)      Critically ill patients:  140 - 180 mg/dL   Diabetes history: DM1 Outpatient Diabetes medications: Levemir 29 units QHS, Novolog 5-20 units TID with meals Current orders for Inpatient glycemic control: Novolog 0-9 units Q6H, Levemir 15 units daily   Inpatient Diabetes Program Recommendations Insulin - Basal: Please consider increasing Levemir to 25 units daily. Correction (SSI): Please consider changing frequency of CBGs and Novolog to ACHS. Insulin - Meal Coverage: Please consider ordering Novolog 5 units TID with meals for meal coverage (in addition to Novolog correction scale).  Thanks, Orlando PennerMarie Waylan Busta, RN, MSN, CCRN, CDE Diabetes Coordinator Inpatient Diabetes Program [phone] (Team Pager) [phone] (AP office) [phone] [employer]([facility] office)

## 2014-12-28 NOTE — Care Management Note (Signed)
    Page 1 of 1   12/28/2014     2:30:00 PM CARE MANAGEMENT NOTE 12/28/2014  Patient:  Tyler Mann,Tyler Mann   Account Number:  [id number]  Date Initiated:  12/28/2014  Documentation initiated by:  CHILDRESS,JESSICA  Subjective/Objective Assessment:   Pt admitted for drug overdose. Pt from home, lives with parents, has no HH services, DME's or med needs prior to admission. TTS consult pending. Anticipated discharge to inpatient psych hospital. CSW aware of discharge plan.     Action/Plan:   No CM needs identified.   Anticipated DC Date:  12/30/2014   Anticipated DC Plan:  PSYCHIATRIC HOSPITAL  In-house referral  Clinical Social Worker      DC Planning Services  CM consult      Choice offered to / List presented to:             Status of service:  Completed, signed off Medicare Important Message given?   (If response is "NO", the following Medicare IM given date fields will be blank) Date Medicare IM given:   Medicare IM given by:   Date Additional Medicare IM given:   Additional Medicare IM given by:    Discharge Disposition:  PSYCHIATRIC HOSPITAL  Per UR Regulation:  Reviewed for med. necessity/level of care/duration of stay  If discussed at Long Length of Stay Meetings, dates discussed:    Comments:  2/92016 1430 Kathyrn SheriffJessica Childress, RN, MSN, CM

## 2014-12-28 NOTE — Clinical Social Work Note (Signed)
IVC paperwork completed yesterday.   Patient awaiting psych eval.   Patient was irritable and cursed at CSW multiple times.  He stated that he was going to leave the hospital.  CSW will continue to assess for ongoing needs.    Tretha SciaraHeather Mrk Buzby, Kentucky[place]CS[place] [phone]

## 2014-12-28 NOTE — Progress Notes (Signed)
PT HAS HAD TELE/PSYC ASSESSMENT.WE ARE WAITING RESULTS OF ASSESSMENT.

## 2014-12-28 NOTE — Plan of Care (Signed)
Problem: Consults Goal: General Medical Patient Education See Patient Education Module for specific education. Outcome: Progressing Patient admitted for low blood sugar, placed on a ventilator after being combative in the ED.   Placed on the ventilator overnight.  Extubated this am and is eating today.  Concerns that patient was trying to commit suicide.  Texts shown by parents that suicidal ideations were in play.  Patient denies trying to hurt himself.   Goal: Diabetes Guidelines if Diabetic/Glucose > 140 If diabetic or lab glucose is > 140 mg/dl - Initiate Diabetes/Hyperglycemia Guidelines & Document Interventions  Outcome: Completed/Met Date Met:  12/28/14 Tonight blood sugars remain below 140.  Problem: Phase I Progression Outcomes Goal: Pain controlled with appropriate interventions Outcome: Progressing Tylenol given for generalized aches and pains,  States stomach hurts. Goal: Initial discharge plan identified Outcome: Progressing Telepsych to see patient and probable transfer to behavioral health at discharge

## 2014-12-28 NOTE — Progress Notes (Signed)
Correction to below note:  Low blood sugar,

## 2014-12-28 NOTE — Progress Notes (Signed)
He is off the ventilator and has no lung disease so I will plan to sign off at this point. Thanks for allowing me to see him with you

## 2014-12-28 NOTE — BH Assessment (Signed)
Writer was informed of the consult for a Tele Psych.

## 2014-12-28 NOTE — Progress Notes (Addendum)
[facility] PROGRESS NOTE  Tyler Mann ZOX:[id number] DOB: 1980/12/27 DOA: 12/26/2014 PCP: Tyler Pizza, MD  Assessment/Plan: 34 y/o man with PMH of DM, history of heroin abuse presented with intentional overdose of 20 pills of Xanax, 300 units of unknown type of insulin.  -intubated for airway protection and required high doses of dextrose to maintain his blood sugar. Currently his conditions improving, hypoglycemia has resolved and he will likely be extubated 2/8.  Awaiting psychiatry consultation   1. Intentional overdose with Xanax and insulin. Tylenol, salicylate, ethanol levels unremarkable. Drug screen positive for benzodiazepines. -patient denies suicidal or homicidal ideations or plans; awaiting psychiatry consultation; counceled about drug use  2. Severe hypoglycemia secondary to overdose on insulin, resolved. 3. Diabetes mellitus type 1 uncontrolled by hemoglobin A1c10.4. Hypoglycemia resolved, will start ISS 4. Normocytic anemia, suspect anemia of chronic disease, at baseline. 5. Acute encephalopathy with agitation requiring restraints and intubation for airway protection. Secondary to drug overdose of hypoglycemia. -encephalopathy resolved, neuro exam non focal, mental status at baseline  6. Narcotic addiction, heroin abuse, counseled about drug use; declined rehab  -Hospitalized 11/2014 for DKA, also noted to have amphetamine and heroin substance abuse. Suicidal ideations were expressed and he was admitted for psychiatric evaluation. Currently denies SI/HI -awaiting psychiatry evaluation   Code Status: full Family Communication: d/w patient, nursing care (indicate person spoken with, relationship, and if by phone, the number) Disposition Plan: pend psychiatry consultation    Consultants:  Psychiatry   Pulmonology   Procedures:  ETT 2/7  Antibiotics:  none (indicate start date, and stop date if known)  HPI/Subjective: alert  Objective: Filed Vitals:    12/28/14 0800  BP: 123/82  Pulse: 99  Temp:   Resp: 19    Intake/Output Summary (Last 24 hours) at 12/28/14 0905 Last data filed at 12/28/14 0800  Gross per 24 hour  Intake 1766.25 ml  Output   3325 ml  Net -1558.75 ml   Filed Weights   12/26/14 0415 12/27/14 0500 12/28/14 0600  Weight: 73.3 kg (161 lb 9.6 oz) 71.6 kg (157 lb 13.6 oz) 70.8 kg (156 lb 1.4 oz)    Exam:   General:  Alert, oriented, no distress   Cardiovascular: s1,s2 rrr  Respiratory: CTA BL  Abdomen: soft, nt,nd   Musculoskeletal: no LE edema   Data Reviewed: Basic Metabolic Panel:  Recent Labs Lab 12/26/14 0047 12/26/14 0439 12/27/14 0500  NA 140  --  140  K 3.6  --  3.9  CL 105  --  106  CO2 30  --  26  GLUCOSE 117* 105* 143*  BUN 24*  --  7  CREATININE 0.79  --  0.69  CALCIUM 9.3  --  8.6   Liver Function Tests:  Recent Labs Lab 12/26/14 0047  AST 24  ALT 28  ALKPHOS 84  BILITOT 0.3  PROT 7.4  ALBUMIN 3.5   No results for input(s): LIPASE, AMYLASE in the last 168 hours. No results for input(s): AMMONIA in the last 168 hours. CBC:  Recent Labs Lab 12/26/14 0047 12/27/14 0500  WBC 6.9 6.8  NEUTROABS 2.5  --   HGB 10.8* 10.6*  HCT 33.6* 34.7*  MCV 87.0 90.1  PLT 391 389   Cardiac Enzymes: No results for input(s): CKTOTAL, CKMB, CKMBINDEX, TROPONINI in the last 168 hours. BNP (last 3 results) No results for input(s): BNP in the last 8760 hours.  ProBNP (last 3 results) No results for input(s): PROBNP in the last 8760 hours.  CBG:  Recent Labs Lab 12/27/14 0307 12/27/14 0430 12/27/14 0524 12/27/14 0634 12/27/14 0750  GLUCAP 104* 129* 139* 169* 209*    Recent Results (from the past 240 hour(s))  MRSA PCR Screening     Status: None   Collection Time: 12/26/14  3:30 AM  Result Value Ref Range Status   MRSA by PCR NEGATIVE NEGATIVE Final    Comment:        The GeneXpert MRSA Assay (FDA approved for NASAL specimens only), is one component of  a comprehensive MRSA colonization surveillance program. It is not intended to diagnose MRSA infection nor to guide or monitor treatment for MRSA infections.      Studies: No results found.  Scheduled Meds: . antiseptic oral rinse  7 mL Mouth Rinse QID  . chlorhexidine  15 mL Mouth Rinse BID  . enoxaparin (LOVENOX) injection  40 mg Subcutaneous Q24H  . insulin aspart  0-9 Units Subcutaneous TID AC & HS  . insulin detemir  15 Units Subcutaneous Daily  . pantoprazole (PROTONIX) IV  40 mg Intravenous Q24H   Continuous Infusions:   Principal Problem:   Drug overdose, intentional Active Problems:   Diabetes mellitus type 1   Chronic pain   Generalized anxiety disorder   Hypoglycemia    Time spent: >35 minutes     Tyler Mann, Tyler Mann  [facility] Pager [phone]. If 7PM-7AM, please contact night-coverage at [email or web address], password [email or web address] 12/28/2014, 9:05 AM  LOS: 2 days      Addendum:  psychiatry recommending inpatient psychiatry treatment, will obtain tsh, free T4 due to tachycardia.  Tyler Mann, Tyler Mann

## 2014-12-28 NOTE — Consult Note (Signed)
Telepsych Consultation   Reason for Consult:  SI Overdose, severe Referring Physician:  Dr. Ephriam Jenkins is an 34 y.o. male.  Assessment: AXIS I:  Substance Abuse and Substance Induced Mood Disorder AXIS II:  Deferred AXIS III:   Past Medical History  Diagnosis Date  . Diabetes mellitus   . Neuropathy   . Narcotic addiction     Hx IV heroin abuse  . Chronic back pain    AXIS IV:  economic problems, housing problems, occupational problems, problems related to social environment and problems with primary support group AXIS V:  41-50 serious symptoms  Plan:  Recommend psychiatric Inpatient admission when medically cleared.  Subjective:   Tyler Mann is a 34 y.o. male patient who presented to the [facility] for evaluation of suicidal attempt, severe, with overdose of insulin (300 units reported) and 20 Xanax pills. Pt had to be intubated. Pt reports that he "was overwhelmed and wanted to die" and that he thinks about death daily. Pt reports that he would like to go home but understands he may have to be admitted. Pt reports the above SI, denies HI and AVH.  HPI:  34 y/o man with PMH of DM, history of heroin abuse presented with intentional overdose of 20 pills of Xanax, 300 units of unknown type of insulin.-intubated for airway protection and required high doses of dextrose to maintain his blood sugar. Currently his conditions improving, hypoglycemia has resolved and he will likely be extubated 2/8. Awaiting psychiatry consultation    HPI Elements:   Location:  generalized. Quality:  acute. Severity:  severe. Timing:  ongoing. Duration:  past few weeks. Context:  substance abuse, job loss, psycho social stressors .  Past Psychiatric History: Past Medical History  Diagnosis Date  . Diabetes mellitus   . Neuropathy   . Narcotic addiction     Hx IV heroin abuse  . Chronic back pain     reports that he has been smoking Cigarettes.  He has a 17 pack-year smoking  history. He uses smokeless tobacco. He reports that he uses illicit drugs (Methamphetamines and Heroin). He reports that he does not drink alcohol. History reviewed. No pertinent family history.   Living Arrangements: Parent   Allergies:   Allergies  Allergen Reactions  . Trazodone And Nefazodone Other (See Comments)    Hallucinations and nightmares    ACT Assessment Complete: no telessessment completed   Educational Status    Risk to Self: Risk to self with the past 6 months Is patient at risk for suicide?: Yes Substance abuse history and/or treatment for substance abuse?: Yes  Risk to Others:    Abuse: Abuse/Neglect Assessment (Assessment to be complete while patient is alone) Physical Abuse: Denies Verbal Abuse: Denies Sexual Abuse: Denies Exploitation of patient/patient's resources: Denies Self-Neglect: Denies  Prior Inpatient Therapy:    Prior Outpatient Therapy:    Additional Information:      Objective: Blood pressure 100/60, pulse 107, temperature 98.2 F (36.8 C), temperature source Oral, resp. rate 22, height '5\' 9"'  (1.753 m), weight 70.8 kg (156 lb 1.4 oz), SpO2 98 %.Body mass index is 23.04 kg/(m^2). Results for orders placed or performed during the hospital encounter of 12/26/14 (from the past 72 hour(s))  CBG monitoring, ED     Status: Abnormal   Collection Time: 12/26/14 12:30 AM  Result Value Ref Range   Glucose-Capillary 27 (LL) 70 - 99 mg/dL   Comment 1 Notify RN   CBG monitoring, ED  Status: Abnormal   Collection Time: 12/26/14 12:46 AM  Result Value Ref Range   Glucose-Capillary 144 (H) 70 - 99 mg/dL  CBC with Differential     Status: Abnormal   Collection Time: 12/26/14 12:47 AM  Result Value Ref Range   WBC 6.9 4.0 - 10.5 K/uL   RBC 3.86 (L) 4.22 - 5.81 MIL/uL   Hemoglobin 10.8 (L) 13.0 - 17.0 g/dL   HCT 33.6 (L) 39.0 - 52.0 %   MCV 87.0 78.0 - 100.0 fL   MCH 28.0 26.0 - 34.0 pg   MCHC 32.1 30.0 - 36.0 g/dL   RDW 15.9 (H) 11.5 - 15.5 %    Platelets 391 150 - 400 K/uL   Neutrophils Relative % 36 (L) 43 - 77 %   Neutro Abs 2.5 1.7 - 7.7 K/uL   Lymphocytes Relative 50 (H) 12 - 46 %   Lymphs Abs 3.5 0.7 - 4.0 K/uL   Monocytes Relative 9 3 - 12 %   Monocytes Absolute 0.6 0.1 - 1.0 K/uL   Eosinophils Relative 4 0 - 5 %   Eosinophils Absolute 0.3 0.0 - 0.7 K/uL   Basophils Relative 1 0 - 1 %   Basophils Absolute 0.0 0.0 - 0.1 K/uL  Comprehensive metabolic panel     Status: Abnormal   Collection Time: 12/26/14 12:47 AM  Result Value Ref Range   Sodium 140 135 - 145 mmol/L   Potassium 3.6 3.5 - 5.1 mmol/L   Chloride 105 96 - 112 mmol/L   CO2 30 19 - 32 mmol/L   Glucose, Bld 117 (H) 70 - 99 mg/dL   BUN 24 (H) 6 - 23 mg/dL   Creatinine, Ser 0.79 0.50 - 1.35 mg/dL   Calcium 9.3 8.4 - 10.5 mg/dL   Total Protein 7.4 6.0 - 8.3 g/dL   Albumin 3.5 3.5 - 5.2 g/dL   AST 24 0 - 37 U/L   ALT 28 0 - 53 U/L   Alkaline Phosphatase 84 39 - 117 U/L   Total Bilirubin 0.3 0.3 - 1.2 mg/dL   GFR calc non Af Amer >90 >90 mL/min   GFR calc Af Amer >90 >90 mL/min    Comment: (NOTE) The eGFR has been calculated using the CKD EPI equation. This calculation has not been validated in all clinical situations. eGFR's persistently <90 mL/min signify possible Chronic Kidney Disease.    Anion gap 5 5 - 15  Acetaminophen level     Status: Abnormal   Collection Time: 12/26/14 12:47 AM  Result Value Ref Range   Acetaminophen (Tylenol), Serum <10.0 (L) 10 - 30 ug/mL    Comment:        THERAPEUTIC CONCENTRATIONS VARY SIGNIFICANTLY. A RANGE OF 10-30 ug/mL MAY BE AN EFFECTIVE CONCENTRATION FOR MANY PATIENTS. HOWEVER, SOME ARE BEST TREATED AT CONCENTRATIONS OUTSIDE THIS RANGE. ACETAMINOPHEN CONCENTRATIONS >150 ug/mL AT 4 HOURS AFTER INGESTION AND >50 ug/mL AT 12 HOURS AFTER INGESTION ARE OFTEN ASSOCIATED WITH TOXIC REACTIONS.   Salicylate level     Status: None   Collection Time: 12/26/14 12:47 AM  Result Value Ref Range   Salicylate Lvl  <9.9 2.8 - 20.0 mg/dL  Ethanol     Status: None   Collection Time: 12/26/14 12:47 AM  Result Value Ref Range   Alcohol, Ethyl (B) <5 0 - 9 mg/dL    Comment:        LOWEST DETECTABLE LIMIT FOR SERUM ALCOHOL IS 11 mg/dL FOR MEDICAL PURPOSES ONLY   CBG  monitoring, ED     Status: Abnormal   Collection Time: 12/26/14  1:03 AM  Result Value Ref Range   Glucose-Capillary 66 (L) 70 - 99 mg/dL  CBG monitoring, ED     Status: Abnormal   Collection Time: 12/26/14  1:15 AM  Result Value Ref Range   Glucose-Capillary 115 (H) 70 - 99 mg/dL  Urinalysis, Routine w reflex microscopic     Status: Abnormal   Collection Time: 12/26/14  1:19 AM  Result Value Ref Range   Color, Urine YELLOW YELLOW   APPearance CLEAR CLEAR   Specific Gravity, Urine <1.005 (L) 1.005 - 1.030   pH 6.5 5.0 - 8.0   Glucose, UA 250 (A) NEGATIVE mg/dL   Hgb urine dipstick NEGATIVE NEGATIVE   Bilirubin Urine NEGATIVE NEGATIVE   Ketones, ur NEGATIVE NEGATIVE mg/dL   Protein, ur NEGATIVE NEGATIVE mg/dL   Urobilinogen, UA 0.2 0.0 - 1.0 mg/dL   Nitrite NEGATIVE NEGATIVE   Leukocytes, UA NEGATIVE NEGATIVE    Comment: MICROSCOPIC NOT DONE ON URINES WITH NEGATIVE PROTEIN, BLOOD, LEUKOCYTES, NITRITE, OR GLUCOSE <1000 mg/dL.  Drug screen panel, emergency     Status: Abnormal   Collection Time: 12/26/14  1:19 AM  Result Value Ref Range   Opiates NONE DETECTED NONE DETECTED   Cocaine NONE DETECTED NONE DETECTED   Benzodiazepines POSITIVE (A) NONE DETECTED   Amphetamines NONE DETECTED NONE DETECTED   Tetrahydrocannabinol NONE DETECTED NONE DETECTED   Barbiturates NONE DETECTED NONE DETECTED    Comment:        DRUG SCREEN FOR MEDICAL PURPOSES ONLY.  IF CONFIRMATION IS NEEDED FOR ANY PURPOSE, NOTIFY LAB WITHIN 5 DAYS.        LOWEST DETECTABLE LIMITS FOR URINE DRUG SCREEN Drug Class       Cutoff (ng/mL) Amphetamine      1000 Barbiturate      200 Benzodiazepine   497 Tricyclics       026 Opiates          300 Cocaine           300 THC              50   CBG monitoring, ED     Status: None   Collection Time: 12/26/14  1:29 AM  Result Value Ref Range   Glucose-Capillary 70 70 - 99 mg/dL  CBG monitoring, ED     Status: Abnormal   Collection Time: 12/26/14  1:47 AM  Result Value Ref Range   Glucose-Capillary 31 (LL) 70 - 99 mg/dL  CBG monitoring, ED     Status: Abnormal   Collection Time: 12/26/14  2:00 AM  Result Value Ref Range   Glucose-Capillary 106 (H) 70 - 99 mg/dL  CBG monitoring, ED     Status: None   Collection Time: 12/26/14  2:19 AM  Result Value Ref Range   Glucose-Capillary 79 70 - 99 mg/dL  CBG monitoring, ED     Status: Abnormal   Collection Time: 12/26/14  2:30 AM  Result Value Ref Range   Glucose-Capillary 65 (L) 70 - 99 mg/dL  CBG monitoring, ED     Status: Abnormal   Collection Time: 12/26/14  2:50 AM  Result Value Ref Range   Glucose-Capillary 113 (H) 70 - 99 mg/dL  CBG monitoring, ED     Status: None   Collection Time: 12/26/14  3:05 AM  Result Value Ref Range   Glucose-Capillary 80 70 - 99 mg/dL  CBG monitoring, ED  Status: Abnormal   Collection Time: 12/26/14  3:15 AM  Result Value Ref Range   Glucose-Capillary 68 (L) 70 - 99 mg/dL  MRSA PCR Screening     Status: None   Collection Time: 12/26/14  3:30 AM  Result Value Ref Range   MRSA by PCR NEGATIVE NEGATIVE    Comment:        The GeneXpert MRSA Assay (FDA approved for NASAL specimens only), is one component of a comprehensive MRSA colonization surveillance program. It is not intended to diagnose MRSA infection nor to guide or monitor treatment for MRSA infections.   CBG monitoring, ED     Status: Abnormal   Collection Time: 12/26/14  3:40 AM  Result Value Ref Range   Glucose-Capillary 129 (H) 70 - 99 mg/dL  Protime-INR     Status: Abnormal   Collection Time: 12/26/14  4:12 AM  Result Value Ref Range   Prothrombin Time 11.5 (L) 11.6 - 15.2 seconds   INR 0.83 0.00 - 1.49  Blood gas, arterial     Status:  Abnormal   Collection Time: 12/26/14  4:15 AM  Result Value Ref Range   FIO2 40.00 %   Delivery systems VENTILATOR    Mode PRESSURE REGULATED VOLUME CONTROL    VT 530 mL   Rate 14 resp/min   Peep/cpap 5.0 cm H20   pH, Arterial 7.403 7.350 - 7.450   pCO2 arterial 45.1 (H) 35.0 - 45.0 mmHg   pO2, Arterial 175.0 (H) 80.0 - 100.0 mmHg   Bicarbonate 27.6 (H) 20.0 - 24.0 mEq/L   TCO2 25.3 0 - 100 mmol/L   Acid-Base Excess 3.2 (H) 0.0 - 2.0 mmol/L   O2 Saturation 98.9 %   Patient temperature 37.0    Collection site LEFT RADIAL    Drawn by [id number]    Sample type ARTERIAL    Allens test (pass/fail) PASS PASS  Glucose, capillary     Status: Abnormal   Collection Time: 12/26/14  4:36 AM  Result Value Ref Range   Glucose-Capillary 109 (H) 70 - 99 mg/dL  Glucose, random     Status: Abnormal   Collection Time: 12/26/14  4:39 AM  Result Value Ref Range   Glucose, Bld 105 (H) 70 - 99 mg/dL  Glucose, capillary     Status: Abnormal   Collection Time: 12/26/14  5:27 AM  Result Value Ref Range   Glucose-Capillary 121 (H) 70 - 99 mg/dL   Comment 1 Notify RN   Glucose, capillary     Status: Abnormal   Collection Time: 12/26/14  6:43 AM  Result Value Ref Range   Glucose-Capillary 140 (H) 70 - 99 mg/dL   Comment 1 Notify RN   Glucose, capillary     Status: Abnormal   Collection Time: 12/26/14  7:53 AM  Result Value Ref Range   Glucose-Capillary 105 (H) 70 - 99 mg/dL   Comment 1 Notify RN   Glucose, capillary     Status: Abnormal   Collection Time: 12/26/14  8:39 AM  Result Value Ref Range   Glucose-Capillary 119 (H) 70 - 99 mg/dL   Comment 1 Notify RN   Glucose, capillary     Status: Abnormal   Collection Time: 12/26/14  9:43 AM  Result Value Ref Range   Glucose-Capillary 150 (H) 70 - 99 mg/dL   Comment 1 Notify RN   Glucose, capillary     Status: Abnormal   Collection Time: 12/26/14 10:41 AM  Result Value Ref Range  Glucose-Capillary 179 (H) 70 - 99 mg/dL   Comment 1 Notify RN    Glucose, capillary     Status: Abnormal   Collection Time: 12/26/14 11:45 AM  Result Value Ref Range   Glucose-Capillary 187 (H) 70 - 99 mg/dL   Comment 1 Notify RN   Glucose, capillary     Status: Abnormal   Collection Time: 12/26/14  1:03 PM  Result Value Ref Range   Glucose-Capillary 212 (H) 70 - 99 mg/dL   Comment 1 Notify RN   Glucose, capillary     Status: Abnormal   Collection Time: 12/26/14  1:41 PM  Result Value Ref Range   Glucose-Capillary 221 (H) 70 - 99 mg/dL   Comment 1 Notify RN   Glucose, capillary     Status: Abnormal   Collection Time: 12/26/14  2:45 PM  Result Value Ref Range   Glucose-Capillary 312 (H) 70 - 99 mg/dL   Comment 1 Notify RN   Glucose, capillary     Status: Abnormal   Collection Time: 12/26/14  3:57 PM  Result Value Ref Range   Glucose-Capillary 359 (H) 70 - 99 mg/dL   Comment 1 Notify RN   Glucose, capillary     Status: Abnormal   Collection Time: 12/26/14  6:00 PM  Result Value Ref Range   Glucose-Capillary 136 (H) 70 - 99 mg/dL   Comment 1 Notify RN   Glucose, capillary     Status: None   Collection Time: 12/26/14  6:54 PM  Result Value Ref Range   Glucose-Capillary 92 70 - 99 mg/dL   Comment 1 Notify RN   Glucose, capillary     Status: Abnormal   Collection Time: 12/26/14  8:14 PM  Result Value Ref Range   Glucose-Capillary 54 (L) 70 - 99 mg/dL   Comment 1 Notify RN   Glucose, capillary     Status: None   Collection Time: 12/26/14  8:52 PM  Result Value Ref Range   Glucose-Capillary 86 70 - 99 mg/dL  Glucose, capillary     Status: Abnormal   Collection Time: 12/26/14  9:53 PM  Result Value Ref Range   Glucose-Capillary 134 (H) 70 - 99 mg/dL   Comment 1 Notify RN   Glucose, capillary     Status: Abnormal   Collection Time: 12/26/14 11:03 PM  Result Value Ref Range   Glucose-Capillary 159 (H) 70 - 99 mg/dL   Comment 1 Notify RN   Glucose, capillary     Status: Abnormal   Collection Time: 12/26/14 11:34 PM  Result Value Ref  Range   Glucose-Capillary 195 (H) 70 - 99 mg/dL  Glucose, capillary     Status: Abnormal   Collection Time: 12/27/14 12:58 AM  Result Value Ref Range   Glucose-Capillary 114 (H) 70 - 99 mg/dL   Comment 1 Notify RN   Glucose, capillary     Status: None   Collection Time: 12/27/14  2:07 AM  Result Value Ref Range   Glucose-Capillary 71 70 - 99 mg/dL   Comment 1 Notify RN   Glucose, capillary     Status: Abnormal   Collection Time: 12/27/14  3:07 AM  Result Value Ref Range   Glucose-Capillary 104 (H) 70 - 99 mg/dL   Comment 1 Notify RN   Glucose, capillary     Status: Abnormal   Collection Time: 12/27/14  4:30 AM  Result Value Ref Range   Glucose-Capillary 129 (H) 70 - 99 mg/dL   Comment 1  Notify RN   Basic metabolic panel     Status: Abnormal   Collection Time: 12/27/14  5:00 AM  Result Value Ref Range   Sodium 140 135 - 145 mmol/L   Potassium 3.9 3.5 - 5.1 mmol/L   Chloride 106 96 - 112 mmol/L   CO2 26 19 - 32 mmol/L   Glucose, Bld 143 (H) 70 - 99 mg/dL   BUN 7 6 - 23 mg/dL    Comment: DELTA CHECK NOTED   Creatinine, Ser 0.69 0.50 - 1.35 mg/dL   Calcium 8.6 8.4 - 10.5 mg/dL   GFR calc non Af Amer >90 >90 mL/min   GFR calc Af Amer >90 >90 mL/min    Comment: (NOTE) The eGFR has been calculated using the CKD EPI equation. This calculation has not been validated in all clinical situations. eGFR's persistently <90 mL/min signify possible Chronic Kidney Disease.    Anion gap 8 5 - 15  CBC     Status: Abnormal   Collection Time: 12/27/14  5:00 AM  Result Value Ref Range   WBC 6.8 4.0 - 10.5 K/uL   RBC 3.85 (L) 4.22 - 5.81 MIL/uL   Hemoglobin 10.6 (L) 13.0 - 17.0 g/dL   HCT 34.7 (L) 39.0 - 52.0 %   MCV 90.1 78.0 - 100.0 fL   MCH 27.5 26.0 - 34.0 pg   MCHC 30.5 30.0 - 36.0 g/dL   RDW 16.9 (H) 11.5 - 15.5 %   Platelets 389 150 - 400 K/uL  Triglycerides     Status: Abnormal   Collection Time: 12/27/14  5:00 AM  Result Value Ref Range   Triglycerides 260 (H) <150 mg/dL   Glucose, capillary     Status: Abnormal   Collection Time: 12/27/14  5:24 AM  Result Value Ref Range   Glucose-Capillary 139 (H) 70 - 99 mg/dL   Comment 1 Notify RN   Glucose, capillary     Status: Abnormal   Collection Time: 12/27/14  6:34 AM  Result Value Ref Range   Glucose-Capillary 169 (H) 70 - 99 mg/dL   Comment 1 Notify RN   Glucose, capillary     Status: Abnormal   Collection Time: 12/27/14  7:50 AM  Result Value Ref Range   Glucose-Capillary 209 (H) 70 - 99 mg/dL   Comment 1 Documented in Chart    Comment 2 Notify RN   Blood gas, arterial     Status: Abnormal   Collection Time: 12/27/14  9:10 AM  Result Value Ref Range   FIO2 40.00 %   Delivery systems VENTILATOR    Mode CONTINUOUS POSITIVE AIRWAY PRESSURE    Peep/cpap 5.0 cm H20   Pressure support 5 cm H20   pH, Arterial 7.385 7.350 - 7.450   pCO2 arterial 41.3 35.0 - 45.0 mmHg   pO2, Arterial 167.0 (H) 80.0 - 100.0 mmHg   Bicarbonate 24.2 (H) 20.0 - 24.0 mEq/L   TCO2 22.2 0 - 100 mmol/L   Acid-base deficit 0.2 0.0 - 2.0 mmol/L   O2 Saturation 98.9 %   Patient temperature 37.0    Collection site LEFT BRACHIAL    Drawn by [id number]    Sample type ARTERIAL    Allens test (pass/fail) PASS PASS   Labs are reviewed and are pertinent for medical issues being treated .  Current Facility-Administered Medications  Medication Dose Route Frequency Provider Last Rate Last Dose  . acetaminophen (TYLENOL) tablet 650 mg  650 mg Oral Q6H PRN Samuella Cota,  MD   650 mg at 12/27/14 2259  . antiseptic oral rinse (CPC / CETYLPYRIDINIUM CHLORIDE 0.05%) solution 7 mL  7 mL Mouth Rinse QID Phillips Grout, MD   7 mL at 12/28/14 1200  . chlorhexidine (PERIDEX) 0.12 % solution 15 mL  15 mL Mouth Rinse BID Phillips Grout, MD   15 mL at 12/28/14 0752  . enoxaparin (LOVENOX) injection 40 mg  40 mg Subcutaneous Q24H Phillips Grout, MD   40 mg at 12/27/14 0755  . insulin aspart (novoLOG) injection 0-9 Units  0-9 Units Subcutaneous TID AC &  HS Kinnie Feil, MD   3 Units at 12/28/14 1136  . insulin detemir (LEVEMIR) injection 15 Units  15 Units Subcutaneous Daily Samuella Cota, MD   15 Units at 12/28/14 1100  . levalbuterol (XOPENEX) nebulizer solution 0.63 mg  0.63 mg Nebulization Q6H PRN Samuella Cota, MD      . ondansetron [facility]) tablet 4 mg  4 mg Oral Q6H PRN Phillips Grout, MD       Or  . ondansetron [facility]) injection 4 mg  4 mg Intravenous Q6H PRN Phillips Grout, MD      . pantoprazole (PROTONIX) injection 40 mg  40 mg Intravenous Q24H Colbert Coyer, MD   40 mg at 12/28/14 1100    Psychiatric Specialty Exam:     Blood pressure 100/60, pulse 107, temperature 98.2 F (36.8 C), temperature source Oral, resp. rate 22, height '5\' 9"'  (1.753 m), weight 70.8 kg (156 lb 1.4 oz), SpO2 98 %.Body mass index is 23.04 kg/(m^2).  General Appearance: Guarded  Eye Contact::  Poor  Speech:  Clear and Coherent and Pressured  Volume:  Increased  Mood:  Angry, Dysphoric, Irritable and Worthless  Affect:  Congruent  Thought Process:  Circumstantial, Coherent and Loose  Orientation:  Full (Time, Place, and Person)  Thought Content:  focused on psychosocial stressors   Suicidal Thoughts:  Yes.  without intent/plan although minimizing; severe OD on insulin and Xanax  Homicidal Thoughts:  No  Memory:  Immediate;   Fair Recent;   Fair Remote;   Fair  Judgement:  Impaired  Insight:  Lacking  Psychomotor Activity:  Increased  Concentration:  Poor  Recall:  Fair  Akathisia:  No  Handed:  Right  AIMS (if indicated):     Assets:  Leisure Time Social Support  Sleep:      Treatment Plan Summary: recommend inpatient psychiatric admission when medically cleared       Tyler Mann, [place], FNP-BC 12/28/2014 2:34PM

## 2014-12-29 DIAGNOSIS — T50902S Poisoning by unspecified drugs, medicaments and biological substances, intentional self-harm, sequela: Secondary | ICD-10-CM

## 2014-12-29 LAB — GLUCOSE, CAPILLARY
GLUCOSE-CAPILLARY: 276 mg/dL — AB (ref 70–99)
GLUCOSE-CAPILLARY: 167 mg/dL — AB (ref 70–99)
GLUCOSE-CAPILLARY: 392 mg/dL — AB (ref 70–99)
Glucose-Capillary: 127 mg/dL — ABNORMAL HIGH (ref 70–99)
Glucose-Capillary: 138 mg/dL — ABNORMAL HIGH (ref 70–99)
Glucose-Capillary: 195 mg/dL — ABNORMAL HIGH (ref 70–99)
Glucose-Capillary: 217 mg/dL — ABNORMAL HIGH (ref 70–99)
Glucose-Capillary: 222 mg/dL — ABNORMAL HIGH (ref 70–99)
Glucose-Capillary: 240 mg/dL — ABNORMAL HIGH (ref 70–99)
Glucose-Capillary: 291 mg/dL — ABNORMAL HIGH (ref 70–99)
Glucose-Capillary: 293 mg/dL — ABNORMAL HIGH (ref 70–99)
Glucose-Capillary: 285 mg/dL — ABNORMAL HIGH (ref 70–99)

## 2014-12-29 LAB — T4, FREE: FREE T4: 1.1 ng/dL (ref 0.80–1.80)

## 2014-12-29 NOTE — Clinical Social Work Note (Signed)
[facility] CBG<350 for 24 hours per TTS disposition. CSW notified MD and RN.  Derenda FennelKara Kimsey [place], [place] [phone]

## 2014-12-29 NOTE — Discharge Summary (Addendum)
Physician Discharge Summary  [name]:[id number] DOB: 26-Feb-1981 DOA: 12/26/2014  PCP: Catalina Pizza, MD  Admit date: 12/26/2014 Discharge date: 12/29/2014  Time spent: 45 minutes  Recommendations for Outpatient Follow-up:  -Will be discharged to [facility] for continued psychiatric care.   Discharge Diagnoses:  Principal Problem:   Drug overdose, intentional Active Problems:   Diabetes mellitus type 1   Chronic pain   Generalized anxiety disorder   Hypoglycemia   Discharge Condition: Stable and improved  Filed Weights   12/27/14 0500 12/28/14 0600 12/29/14 0500  Weight: 71.6 kg (157 lb 13.6 oz) 70.8 kg (156 lb 1.4 oz) 71.8 kg (158 lb 4.6 oz)    History of present illness:  34 yo male type 1 dm, drug abuse comes in with ems after ingesting 20 of his xanax pills, and report of 300 units of his insulin. Ems arrived at his house around 1120pm and he was alert and mentating well then. He has both long acting and short acting insulin at home, and unclear which he took tonight. This was an intentional overdose. Pt has declined in the ED since arrival and has been intubated for airway protection and because his oxygen sats were starting to decline. He is currently on d10 gtt and getting an amp of d50 about every 30 minutes. He is sedated and intubated.  Hospital Course:   Ventilatory Dependent Respiratory Failure -Due to BDZ/insulin overdose. -Extubated.  Intentional Drug Overdose -With Xanax and insulin. -Seen by tele-psych. -Medically stable for transfer to psychiatric facility.  Severe Hypoglycemia -Due to insulin OD as a suicide attempt. -Resolved and back on treatment for his DM.  DM I -CBGs elevated due to insulin being held for hypoglycemia. -Today I have increased his levemir back to his home dose and have increased his SSI to moderate and added meal coverage. -BMET shows no acidosis (no DKA). -Can continue to adjust his insulin at [facility]. -His A1C of 10.4 in  Jan/16 demonstrates poor glycemic control at home.  Narcotic Addiction -Uses heroin. -Declined rehab.  Procedures:  None   Consultations:  Psychiatry  Discharge Instructions      Discharge Instructions    Diet Carb Modified    Complete by:  As directed      Increase activity slowly    Complete by:  As directed             Medication List    STOP taking these medications        ALPRAZolam 0.5 MG tablet  Commonly known as:  XANAX     Milnacipran 50 MG Tabs tablet  Commonly known as:  SAVELLA     traZODone 50 MG tablet  Commonly known as:  DESYREL      TAKE these medications        cyclobenzaprine 10 MG tablet  Commonly known as:  FLEXERIL  Take 1 tablet (10 mg total) by mouth every 8 (eight) hours as needed.     hydrOXYzine 10 MG tablet  Commonly known as:  ATARAX/VISTARIL  Take 10 mg by mouth 3 (three) times daily as needed for itching.     insulin aspart 100 UNIT/ML injection  Commonly known as:  novoLOG  Inject 10 Units into the skin 3 (three) times daily with meals.     insulin detemir 100 UNIT/ML injection  Commonly known as:  LEVEMIR  Inject 0.29 mLs (29 Units total) into the skin at bedtime.     meloxicam 15 MG tablet  Commonly known as:  MOBIC  Take 1 tablet (15 mg total) by mouth daily.       Allergies  Allergen Reactions  . Trazodone And Nefazodone Other (See Comments)    Hallucinations and nightmares      The results of significant diagnostics from this hospitalization (including imaging, microbiology, ancillary and laboratory) are listed below for reference.    Significant Diagnostic Studies: Dg Chest Port 1 View  12/26/2014   CLINICAL DATA:  Altered mental status. Possible overdose. Endotracheal tube placement.  EXAM: PORTABLE CHEST - 1 VIEW  COMPARISON:  12/04/2014  FINDINGS: The endotracheal tube is high, 7.5 cm from the carina, advancement of 2 cm recommended. Lung volumes are low. Cardiomediastinal contours are normal. Mild  bibasilar atelectasis. No confluent airspace disease to suggest pneumonia. Pulmonary vasculature is normal. There is no pleural effusion or pneumothorax. No acute osseous abnormality.  IMPRESSION: 1. High positioning of endotracheal tube 7.5 cm from the carina, advancement of 2 cm recommended. 2. Low lung volumes with bibasilar atelectasis.   Electronically Signed   By: Rubye OaksMelanie  Ehinger M.D.   On: 12/26/2014 06:06   Dg Chest Port 1 View  12/04/2014   CLINICAL DATA:  Acute cough. Leukocytosis. Dehydration. Current history of diabetes.  EXAM: PORTABLE CHEST - 1 VIEW  COMPARISON:  11/02/2014 dating back to 06/19/2010.  FINDINGS: Suboptimal inspiration accounts for crowded bronchovascular markings diffusely and atelectasis in the bases, and accentuates the cardiac silhouette. Taking this into account, cardiomediastinal silhouette unremarkable and unchanged. Lungs otherwise clear. No localized airspace consolidation. No pleural effusions. No pneumothorax. Normal pulmonary vascularity.  IMPRESSION: Suboptimal inspiration accounts for bibasilar atelectasis. No acute cardiopulmonary disease otherwise.   Electronically Signed   By: Hulan Saashomas  Lawrence M.D.   On: 12/04/2014 11:42    Microbiology: Recent Results (from the past 240 hour(s))  MRSA PCR Screening     Status: None   Collection Time: 12/26/14  3:30 AM  Result Value Ref Range Status   MRSA by PCR NEGATIVE NEGATIVE Final    Comment:        The GeneXpert MRSA Assay (FDA approved for NASAL specimens only), is one component of a comprehensive MRSA colonization surveillance program. It is not intended to diagnose MRSA infection nor to guide or monitor treatment for MRSA infections.      Labs: Basic Metabolic Panel:  Recent Labs Lab 12/26/14 0047 12/26/14 0439 12/27/14 0500  NA 140  --  140  K 3.6  --  3.9  CL 105  --  106  CO2 30  --  26  GLUCOSE 117* 105* 143*  BUN 24*  --  7  CREATININE 0.79  --  0.69  CALCIUM 9.3  --  8.6   Liver  Function Tests:  Recent Labs Lab 12/26/14 0047  AST 24  ALT 28  ALKPHOS 84  BILITOT 0.3  PROT 7.4  ALBUMIN 3.5   No results for input(s): LIPASE, AMYLASE in the last 168 hours. No results for input(s): AMMONIA in the last 168 hours. CBC:  Recent Labs Lab 12/26/14 0047 12/27/14 0500  WBC 6.9 6.8  NEUTROABS 2.5  --   HGB 10.8* 10.6*  HCT 33.6* 34.7*  MCV 87.0 90.1  PLT 391 389   Cardiac Enzymes: No results for input(s): CKTOTAL, CKMB, CKMBINDEX, TROPONINI in the last 168 hours. BNP: BNP (last 3 results) No results for input(s): BNP in the last 8760 hours.  ProBNP (last 3 results) No results for input(s): PROBNP in the last 8760  hours.  CBG:  Recent Labs Lab 12/27/14 0634 12/27/14 0750 12/27/14 0926 12/27/14 1146 12/27/14 1455  GLUCAP 169* 209* 204* 213* 304*       Signed:  HERNANDEZ ACOSTA,ESTELA  [facility] Pager: [phone] 12/29/2014, 10:51 AM

## 2014-12-29 NOTE — Progress Notes (Signed)
Report called to Elon JesterMichele, Charity fundraiserN. Patient transferred to 327 in stable condition with sitter.

## 2014-12-29 NOTE — Progress Notes (Signed)
[facility] Dr[facility] Ardyth HarpsHernandez[facility] not have [facility] and be off telemetry.

## 2014-12-30 ENCOUNTER — Inpatient Hospital Stay (HOSPITAL_COMMUNITY)
Admission: AD | Admit: 2014-12-30 | Discharge: 2015-01-04 | DRG: 885 | Disposition: A | Discharge status: Home / Self Care | Payer: BLUE CROSS/BLUE SHIELD | Source: Intra-hospital | Attending: Psychiatry | Admitting: Psychiatry

## 2014-12-30 ENCOUNTER — Encounter (HOSPITAL_COMMUNITY): Payer: Self-pay | Admitting: *Deleted

## 2014-12-30 DIAGNOSIS — G4700 Insomnia, unspecified: Secondary | ICD-10-CM | POA: Diagnosis present

## 2014-12-30 DIAGNOSIS — E1021 Type 1 diabetes mellitus with diabetic nephropathy: Secondary | ICD-10-CM | POA: Diagnosis present

## 2014-12-30 DIAGNOSIS — F332 Major depressive disorder, recurrent severe without psychotic features: Principal | ICD-10-CM | POA: Insufficient documentation

## 2014-12-30 DIAGNOSIS — E1065 Type 1 diabetes mellitus with hyperglycemia: Secondary | ICD-10-CM | POA: Diagnosis present

## 2014-12-30 DIAGNOSIS — F11229 Opioid dependence with intoxication, unspecified: Secondary | ICD-10-CM | POA: Insufficient documentation

## 2014-12-30 DIAGNOSIS — F411 Generalized anxiety disorder: Secondary | ICD-10-CM | POA: Diagnosis present

## 2014-12-30 DIAGNOSIS — T50901A Poisoning by unspecified drugs, medicaments and biological substances, accidental (unintentional), initial encounter: Secondary | ICD-10-CM | POA: Insufficient documentation

## 2014-12-30 DIAGNOSIS — T50902D Poisoning by unspecified drugs, medicaments and biological substances, intentional self-harm, subsequent encounter: Secondary | ICD-10-CM

## 2014-12-30 DIAGNOSIS — F17210 Nicotine dependence, cigarettes, uncomplicated: Secondary | ICD-10-CM | POA: Diagnosis present

## 2014-12-30 DIAGNOSIS — F1120 Opioid dependence, uncomplicated: Secondary | ICD-10-CM | POA: Diagnosis present

## 2014-12-30 DIAGNOSIS — Z794 Long term (current) use of insulin: Secondary | ICD-10-CM

## 2014-12-30 DIAGNOSIS — E10329 Type 1 diabetes mellitus with mild nonproliferative diabetic retinopathy without macular edema: Secondary | ICD-10-CM

## 2014-12-30 DIAGNOSIS — E785 Hyperlipidemia, unspecified: Secondary | ICD-10-CM | POA: Diagnosis present

## 2014-12-30 DIAGNOSIS — E109 Type 1 diabetes mellitus without complications: Secondary | ICD-10-CM | POA: Diagnosis present

## 2014-12-30 LAB — GLUCOSE, CAPILLARY
GLUCOSE-CAPILLARY: 324 mg/dL — AB (ref 70–99)
GLUCOSE-CAPILLARY: 40 mg/dL — AB (ref 70–99)
Glucose-Capillary: 508 mg/dL — ABNORMAL HIGH (ref 70–99)
Glucose-Capillary: 306 mg/dL — ABNORMAL HIGH (ref 70–99)
Glucose-Capillary: 90 mg/dL (ref 70–99)
Glucose-Capillary: 185 mg/dL — ABNORMAL HIGH (ref 70–99)
Glucose-Capillary: 359 mg/dL — ABNORMAL HIGH (ref 70–99)

## 2014-12-30 LAB — BASIC METABOLIC PANEL
ANION GAP: 12 (ref 5–15)
BUN: 23 mg/dL (ref 6–23)
CALCIUM: 9 mg/dL (ref 8.4–10.5)
CO2: 25 mmol/L (ref 19–32)
Chloride: 95 mmol/L — ABNORMAL LOW (ref 96–112)
Creatinine, Ser: 0.84 mg/dL (ref 0.50–1.35)
GFR calc Af Amer: >90 mL/min (ref >90)
Glucose, Bld: 491 mg/dL — ABNORMAL HIGH (ref 70–99)
Potassium: 5 mmol/L (ref 3.5–5.1)
SODIUM: 132 mmol/L — AB (ref 135–145)

## 2014-12-30 LAB — TRIGLYCERIDES: Triglycerides: 240 mg/dL — ABNORMAL HIGH (ref <150)

## 2014-12-30 MED ORDER — ENOXAPARIN SODIUM 40 MG/0.4ML ~~LOC~~ SOLN
40.0000 mg | SUBCUTANEOUS | Status: DC
  Filled 2014-12-30 (×7): qty 0.4

## 2014-12-30 MED ORDER — INSULIN ASPART 100 UNIT/ML ~~LOC~~ SOLN
0.0000 [IU] | Freq: Three times a day (TID) | SUBCUTANEOUS | Status: DC
  Administered 2014-12-30: 11 [IU] via SUBCUTANEOUS

## 2014-12-30 MED ORDER — INSULIN ASPART 100 UNIT/ML ~~LOC~~ SOLN
4.0000 [IU] | Freq: Three times a day (TID) | SUBCUTANEOUS | Status: DC
  Administered 2014-12-31 (×2): 4 [IU] via SUBCUTANEOUS

## 2014-12-30 MED ORDER — INSULIN DETEMIR 100 UNIT/ML ~~LOC~~ SOLN
29.0000 [IU] | Freq: Every day | SUBCUTANEOUS | Status: DC
  Administered 2014-12-31 – 2015-01-04 (×5): 29 [IU] via SUBCUTANEOUS

## 2014-12-30 MED ORDER — ALUM & MAG HYDROXIDE-SIMETH 200-200-20 MG/5ML PO SUSP: 30.0000 mL | ORAL | Status: DC | PRN

## 2014-12-30 MED ORDER — ACETAMINOPHEN 325 MG PO TABS: 650.0000 mg | ORAL_TABLET | Freq: Four times a day (QID) | ORAL | Status: DC | PRN

## 2014-12-30 MED ORDER — ONDANSETRON HCL 4 MG/2ML IJ SOLN: 4.0000 mg | Freq: Four times a day (QID) | INTRAMUSCULAR | Status: DC | PRN

## 2014-12-30 MED ORDER — INSULIN ASPART 100 UNIT/ML ~~LOC~~ SOLN
0.0000 [IU] | Freq: Three times a day (TID) | SUBCUTANEOUS | Status: DC
Start: 2014-12-30 — End: 2015-01-01
  Administered 2014-12-31: 5 [IU] via SUBCUTANEOUS
  Administered 2014-12-31: 3 [IU] via SUBCUTANEOUS
  Administered 2014-12-31: 15 [IU] via SUBCUTANEOUS
  Administered 2015-01-01: 3 [IU] via SUBCUTANEOUS

## 2014-12-30 MED ORDER — ONDANSETRON HCL 4 MG PO TABS
4.0000 mg | ORAL_TABLET | Freq: Four times a day (QID) | ORAL | Status: DC | PRN
Start: 2014-12-30 — End: 2015-01-05

## 2014-12-30 MED ORDER — CETYLPYRIDINIUM CHLORIDE 0.05 % MT LIQD: 7.0000 mL | Freq: Four times a day (QID) | OROMUCOSAL | Status: DC

## 2014-12-30 MED ORDER — MAGNESIUM HYDROXIDE 400 MG/5ML PO SUSP: 30.0000 mL | Freq: Every day | ORAL | Status: DC | PRN

## 2014-12-30 MED ORDER — LEVALBUTEROL HCL 0.63 MG/3ML IN NEBU
0.6300 mg | INHALATION_SOLUTION | Freq: Four times a day (QID) | RESPIRATORY_TRACT | Status: DC | PRN
  Filled 2014-12-30: qty 3

## 2014-12-30 MED ORDER — INSULIN DETEMIR 100 UNIT/ML ~~LOC~~ SOLN
29.0000 [IU] | Freq: Every day | SUBCUTANEOUS | Status: DC
  Administered 2014-12-30: 29 [IU] via SUBCUTANEOUS
  Filled 2014-12-30 (×2): qty 0.29

## 2014-12-30 MED ORDER — PANTOPRAZOLE SODIUM 40 MG IV SOLR: 40.0000 mg | INTRAVENOUS | Status: DC

## 2014-12-30 MED ORDER — INSULIN ASPART 100 UNIT/ML ~~LOC~~ SOLN
4.0000 [IU] | Freq: Three times a day (TID) | SUBCUTANEOUS | Status: DC
  Administered 2014-12-30: 4 [IU] via SUBCUTANEOUS

## 2014-12-30 MED ORDER — PANTOPRAZOLE SODIUM 40 MG PO TBEC
40.0000 mg | DELAYED_RELEASE_TABLET | Freq: Every day | ORAL | Status: DC
  Filled 2014-12-30 (×7): qty 1

## 2014-12-30 NOTE — Progress Notes (Signed)
Pt stated that his day has been stressful, but it wasn't anything that he could not handle.

## 2014-12-30 NOTE — Progress Notes (Signed)
[facility]  [place] [employer] [facility]  Patient's IV was removed prior to discharge on 12/29/14, and no IV access was restarted when patient did not discharge on 12/29/14.  Report was given to Orlie PollenMarion Friedman [facility].  She is familiar with this patient and has no questions at this time.  Patient left unit with personal belongings in stable condition, escorted by [employer].

## 2014-12-30 NOTE — Progress Notes (Signed)
[facility] after overdosing on 300 units insulin and 20 xanax. Patient states, "I wasn't suicidal. I just did that to prove everyone wrong who said I wouldn't do it. Besides, I'm facing 25 years to life since the police found a meth lab in my trunk." He denies using substances since discharge from here in Jan and UDS is + only for benzos (likely from OD). Patient sarcastic and irritable on admit. CBG obtained on arrival to unit at 1700 and is a "40." Patient did not indicate to staff that he was feeling low but later acknowledged he is aware when his sugars are out of range. Durene FruitsM Agustin NP notified, 2 juices, 2 fruit cups and 1 cheese stick given to patient. On recheck at 1720 patient was 90. Encouraged to eat a healthy dinner. Upon return from dinner patient stated, "who do I need to speak to about getting some insulin?" "At home, I would have given myself insulin even with the 90 since I was going to eat." Explained to patient we would not be retaking his CBG until hs. "Well that's fine. I just don't want you all to freak out when it's in the 300s or worse." Patient currently denying SI. Has not had HI/AVH during this time. Will continue to monitor closely. Lawrence MarseillesFriedman, Daniya Aramburo Eakes

## 2014-12-30 NOTE — Tx Team (Signed)
Initial Interdisciplinary Treatment Plan   PATIENT STRESSORS: Financial difficulties Health problems Legal issue Occupational concerns Substance abuse - hx of   PATIENT STRENGTHS: Average or above average intelligence General fund of knowledge Supportive family/friends   PROBLEM LIST: Problem List/Patient Goals Date to be addressed Date deferred Reason deferred Estimated date of resolution  "I don't know. Why are you asking me that? Can I just take it one day at a time. Why do I have to name 2 goals? This is stupid."                  Depression  12/30/14     SI attempt 12/30/14                              DISCHARGE CRITERIA:  Ability to meet basic life and health needs Improved stabilization in mood, thinking, and/or behavior Medical problems require only outpatient monitoring Need for constant or close observation no longer present Reduction of life-threatening or endangering symptoms to within safe limits Verbal commitment to aftercare and medication compliance  PRELIMINARY DISCHARGE PLAN: Attend aftercare/continuing care group Outpatient therapy Return to previous living arrangement  PATIENT/FAMIILY INVOLVEMENT: This treatment plan has been presented to and reviewed with the patient, Tyler Mann, and/or family member.  The patient and family have been given the opportunity to ask questions and make suggestions.  Tyler Mann, Tyler Mann 12/30/2014, 6:01 PM

## 2014-12-30 NOTE — Progress Notes (Signed)
12/30/14 Results for Tyler Mann, Tyler Mann (MRN [id number]) as of 12/30/2014 10:10  Ref. Range 12/29/2014 07:29 12/29/2014 11:12 12/29/2014 16:14 12/29/2014 20:01 12/30/2014 07:49  Glucose-Capillary Latest Range: 70-99 mg/dL 130293 (H) 865392 (H) 784240 (H) 285 (H) 508 (H)  Called nurse to recommend a BMET be done to make sure pt is not in DKA. Nurse stated that she would page the doctor. Noted new insulin orders.    Mellissa KohutSherrie Loucille Takach RD, CDE. M.Ed. Pager [phone] Inpatient Diabetes Coordinator

## 2014-12-31 DIAGNOSIS — E10329 Type 1 diabetes mellitus with mild nonproliferative diabetic retinopathy without macular edema: Secondary | ICD-10-CM

## 2014-12-31 DIAGNOSIS — T424X2A Poisoning by benzodiazepines, intentional self-harm, initial encounter: Secondary | ICD-10-CM

## 2014-12-31 LAB — GLUCOSE, CAPILLARY
GLUCOSE-CAPILLARY: 500 mg/dL — AB (ref 70–99)
GLUCOSE-CAPILLARY: 227 mg/dL — AB (ref 70–99)
GLUCOSE-CAPILLARY: 71 mg/dL (ref 70–99)
GLUCOSE-CAPILLARY: 167 mg/dL — AB (ref 70–99)
Glucose-Capillary: 187 mg/dL — ABNORMAL HIGH (ref 70–99)

## 2014-12-31 MED ORDER — MIRTAZAPINE 15 MG PO TBDP
7.5000 mg | ORAL_TABLET | Freq: Every day | ORAL | Status: DC
  Administered 2015-01-01 – 2015-01-02 (×3): 7.5 mg via ORAL
  Filled 2014-12-31 (×5): qty 0.5

## 2014-12-31 MED ORDER — INSULIN ASPART 100 UNIT/ML ~~LOC~~ SOLN
10.0000 [IU] | Freq: Three times a day (TID) | SUBCUTANEOUS | Status: DC
  Administered 2014-12-31 – 2015-01-02 (×5): 10 [IU] via SUBCUTANEOUS

## 2014-12-31 NOTE — Tx Team (Signed)
Interdisciplinary Treatment Plan Update (Adult)   Date: 12/31/2014   Time Reviewed: 8:32 AM  Progress in Treatment:  Attending groups: Yes  Participating in groups:  Yes  Taking medication as prescribed: Yes  Tolerating medication: Yes  Family/Significant othe contact made: Not yet. SPE required for this pt.   Patient understands diagnosis: Yes. Pt demonstrates minimal insight--admitted for SI-attempted overdose, depression/impulsivity/mood instability. Pt overdosed on xanax and insulin.  Discussing patient identified problems/goals with staff: Yes  Medical problems stabilized or resolved: Yes  Denies suicidal/homicidal ideation: Yes during group/self report.  Patient has not harmed self or Others: Yes  New problem(s) identified:  Discharge Plan or Barriers: Pt reports that he [facility] [place] [facility] [place]. Pt reports that he does not take mental health medications but is interested in therapy referral "in either [place] or [place]." Pt plans to return home at d/c.  Additional comments: [facility] after overdosing on 300 units insulin and 20 xanax. Patient states, "I wasn't suicidal. I just did that to prove everyone wrong who said I wouldn't do it. Besides, I'm facing 25 years to life since the police found a meth lab in my trunk." He denies using substances since discharge from here in Jan and UDS is + only for benzos (likely from OD). Patient sarcastic and irritable on admit. CBG obtained on arrival to unit at 1700 and is a "40." Patient did not indicate to staff that he was feeling low but later acknowledged he is aware when his sugars are out of range. Tyler FruitsM Agustin NP notified, 2 juices, 2 fruit cups and 1 cheese stick given to patient. On recheck at 1720 patient was 90. Encouraged to eat a healthy dinner. Upon return from dinner patient stated, "who do I need to speak to about getting some insulin?" "At home, I would have  given myself insulin even with the 90 since I was going to eat." Explained to patient we would not be retaking his CBG until hs. "Well that's fine. I just don't want you all to freak out when it's in the 300s or worse." Patient currently denying SI. Has not had HI/AVH during this time Reason for Continuation of Hospitalization: Depression/mood instability Estimated length of stay: 2-4 days  For review of initial/current patient goals, please see plan of care.  Attendees:  Patient:    Family:    Physician: Nehemiah MassedFernando Cobos 12/31/2014 10:00 AM   Nursing: Thayer Ohmhris RN 12/31/2014 10:00 AM   Clinical Social Worker Danford Tat Smart, LCSWA  12/31/2014 10:00 AM   Other: Trilby LeaverKristin D. LCSWA; Quylle H. LCSW 12/31/2014  10:00 AM   Other: Darden DatesJennifer C. Nurse CM 12/31/2014 10:00 AM   Other: Vikki PortsValerie; [facility] 12/31/2014 10:00 AM   Other:    Scribe for Treatment Team:  The Sherwin-WilliamsHeather Smart LCSWA 12/31/2014 8:32 AM

## 2014-12-31 NOTE — [facility] Group Notes (Signed)
[facility] LCS[facility] Discharge Planning Group Note   12/31/2014 9:19 AM  Participation Quality:  Appropriate   Mood/Affect:  Irritable  Depression Rating:  0  Anxiety Rating:  2  Thoughts of Suicide:  No Will you contract for safety?   NA  Current AVH:  No  Plan for Discharge/Comments:  Pt states that [facility]. "I wasn't trying to kill myself." Pt minimizing mental health issues and S/A problem and deflecting blame on his parents--"they need counseling. They don't communicate well. I'm Fine." Pt hoping to d/c soon and is open to o/p therapy "[facility] CSW assessing.   Transportation Means: [facility]  Supports: parents   Smart, OncologistHeather LCSWA

## 2014-12-31 NOTE — Progress Notes (Addendum)
D) Pt refused his morning medicaions. States I don't need them. Pt angry and not wanting to discuss his medications that have been ordered for him A) Attempts made by another nurse for Pt to take his medications.  R) Refused to take them. "I dont have blood clot and I don't have trouble with my stomach".

## 2014-12-31 NOTE — Plan of Care (Signed)
Problem: Diagnosis: Increased Risk For Suicide Attempt Goal: STG-Patient Will Attend All Groups On The Unit Outcome: Progressing Pt attended evening group on 12/31/14.

## 2014-12-31 NOTE — Progress Notes (Signed)
Inpatient Diabetes Program Recommendations  AACE/ADA: New Consensus Statement on Inpatient Glycemic Control (2013)  Target Ranges:  Prepandial:   less than 140 mg/dL      Peak postprandial:   less than 180 mg/dL (1-2 hours)      Critically ill patients:  140 - 180 mg/dL   Reason for Visit: Hyperglycemia  Results for [name], Canio E (MRN [id number]) as of 12/31/2014 14:59  Ref. Range 12/30/2014 20:09 12/30/2014 22:24 12/31/2014 06:04 12/31/2014 11:31  Glucose-Capillary Latest Range: 70-99 mg/dL 409185 (H) 811359 (H) 914187 (H) 500 (H)   Recommendations: Obtain STAT lab glucose for CBG >400mg /dL. Increase Novolog to 10 units tidwc (home dose) Encourage pt to make healthy choices using portion control in the cafeteria. Add HS correction.  Will continue to follow. Thank you. Ailene Ardshonda Lelaina Oatis, RD, LDN, CDE Inpatient Diabetes Coordinator [phone]

## 2014-12-31 NOTE — [facility] Group Notes (Signed)
[facility] LCSW Group Therapy  12/31/2014 4:09 PM  Type of Therapy:  Group Therapy  Participation Level:  Active  Participation Quality:  Attentive  Affect:  Appropriate  Cognitive:  Alert and Oriented  Insight:  Engaged  Engagement in Therapy:  Improving  Modes of Intervention:  Confrontation, Discussion, Education, Exploration, Problem-solving, Rapport Building, Socialization and Support  Summary of Progress/Problems: Feelings around Relapse. Group members discussed the meaning of relapse and shared personal stories of relapse, how it affected them and others, and how they perceived themselves during this time. Group members were encouraged to identify triggers, warning signs and coping skills used when facing the possibility of relapse. Social supports were discussed and explored in detail. Post Acute Withdrawal Syndrome (handout provided) was introduced and examined. Pt's were encouraged to ask questions, talk about key points associated with PAWS, and process this information in terms of relapse prevention. Olen was attentive and engaged during today's processing group. He shared that he has not relapsed in the past month but is struggling to cope with life and depression-recent suicide attempt. 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 Abenezer continues to show progress in the group setting and stated that he thinks individual and family counseling would be beneficial for him. "I need to figure out how to communicate with other people better and work through what [employer] feeling."   Smart, [place] CityHeather LCSWA  12/31/2014, 4:09 PM

## 2014-12-31 NOTE — Plan of Care (Signed)
Problem: Diagnosis: Increased Risk For Suicide Attempt Goal: STG-Patient Will Report Suicidal Feelings to Staff Outcome: Progressing Pt denied any suicidal ideation on his self-inventory.  He has no plans here in the hospital and does contract verbally to come to staff before acting on any self-harm thoughts.

## 2014-12-31 NOTE — Progress Notes (Signed)
Pt attended AA group this evening.  

## 2014-12-31 NOTE — Progress Notes (Signed)
Pt has been up and active in the milieu today.  He denies any depression or hopelessness but rated his anxiety a 2 on his self-inventory.  He did refuse some medications this morning his lovenox stated,"I have no clotting problems" and refused his protonix stating,"I don't have any stomach problems. His noon blood sugar was 500 Dr. Jama Flavorsobos wanted pt to receive 15 units of novolog and his meal coverage 4 to equal 19 units of novolog along with his levemir 29. Rhonda the diabetes nurse put in some recommendations to increase novolog to 10 units with meals and encourage pt to make healthy choices. Serena ColonelAggie Nwoko NP made some chages and Dr. Jama Flavorsobos has spoken with Internal Medicine unsure of that outcome.  Pt was rechecked cbg 273 Dr. Jama Flavorsobos informed. Pt's  new order for meal coverage started at dinner he was 227 which he received 10 units meal coverage and 5 for his sliding scale.  He denied any S/H ideation or A/V/H.

## 2014-12-31 NOTE — H&P (Signed)
Psychiatric Admission Assessment Adult  Patient Identification: Tyler Mann MRN:  [id number] Date of Evaluation:  12/31/2014 Chief Complaint:  " I guess I crashed. I am back"  Principal Diagnosis: Depression, Substance Dependence Diagnosis:   Patient Active Problem List   Diagnosis Date Noted  . MDD (major depressive disorder), recurrent episode, severe [F33.2] 12/30/2014  . Drug overdose, intentional [T50.902A] 12/26/2014  . Hypoglycemia [E16.2] 12/26/2014  . Overdose of benzodiazepine [T42.4X1A]   . Overdose of insulin [T38.3X1A]   . Substance induced mood disorder [F19.94] 12/08/2014  . Hyperglycemia without ketosis [R73.9]   . Diabetes type 1, uncontrolled [E10.65]   . Suicidal ideation [R45.851] 12/05/2014    Class: Acute  . Hyperglycemia [R73.9] 12/03/2014  . Substance abuse [F19.10] 12/03/2014  . Hypokalemia [E87.6] 11/03/2014  . Leukocytosis [D72.829] 11/02/2014  . Hyperkalemia [E87.5] 11/02/2014  . Chronic back pain [M54.9, G89.29] 11/02/2014  . Hematemesis [K92.0] 11/02/2014  . Generalized anxiety disorder [F41.1] 11/02/2014  . DKA (diabetic ketoacidoses) [E13.10] 11/19/2013  . DKA, type 1 [E10.10] 11/22/2012  . Opiate dependence [F11.20] 11/22/2012  . Tobacco abuse [Z72.0] 11/22/2012  . Diabetic nephropathy [E11.21] 11/22/2012  . Diabetic ketoacidosis [E13.10] 09/20/2012  . Diabetes mellitus type 1 [E10.9] 09/20/2012  . Chronic pain [G89.29] 09/20/2012  . Hyperlipidemia [E78.5] 09/20/2012   History of Present Illness::  34 year old male, known to [place] from recent psychiatric admission  To our unit in January/2016. He states " I was doing OK when I left". He states he was doing " all right" until a few days ago. States " on that day all sort of things went wrong- my mother was tripping , my girlfriend was giving me a hard time, and my sugar was all messed up". He states  He started having severe cravings for drugs ( has a history of methamphetamine and opiate  dependencies) but states he did not use. States " that's when I guess I said " f... It and just overdosed".  Overdosed on Xanax . States " I just wanted all that stuff on my head to stop- I really don't think I wanted to die".  Overdose was severe and led to intubation. He also reports that he developed serious hypoglycemia.  Upon medical clearance was transferred to inpatient psychiatric unit.  Elements: Severe, impulsive overdose in the context of psychosocial stressors. Patient also thinks that poorly controlled diabetes negatively influences his mood and behavior.  Associated Signs/Symptoms: Depression Symptoms: at this time minimizes neuro-vegetative symptoms of depression, denies anhedonia, denies sadness, does endorse a sense of low self esteem. (Hypo) Manic Symptoms: Denies  Anxiety Symptoms:  States he tends to get anxiety when his serum glucose " goes above 300 or so"  Psychotic Symptoms: denies  PTSD Symptoms: Denies  Total Time spent with patient: 45 minutes  Past Medical History:  Past Medical History  Diagnosis Date  . Diabetes mellitus   . Neuropathy   . Narcotic addiction     Hx IV heroin abuse  . Chronic back pain    Past Psychiatric History- Recent psychiatric admission to our unit in January.  States  he was admitted to a psychiatric unit once as a teenager, does not remember circumstances, denies history of psychosis, denies history of mania, denies history of prior  suicide attempts, denies history of violence .   Past Surgical History  Procedure Laterality Date  . Mouth surgery     Family History:  Parents alive, live together, one younger brother. Mother has MS. Denies  history of substance abuse or suicides or psychiatric illness in family Social History:  Single, no children, lives with parents, has a history of legal issues related to drug use .  No current source of income. Currently unemployed .  History  Alcohol Use No     History  Drug Use  . Yes   . Special: Methamphetamines, Heroin    Comment: recovering from heroin-     History   Social History  . Marital Status: Single    Spouse Name: N/A  . Number of Children: N/A  . Years of Education: N/A   Social History Main Topics  . Smoking status: Current Every Day Smoker -- 1.00 packs/day for 17 years    Types: Cigarettes  . Smokeless tobacco: Current User  . Alcohol Use: No  . Drug Use: Yes    Special: Methamphetamines, Heroin     Comment: recovering from heroin-   . Sexual Activity: Yes   Other Topics Concern  . None   Social History Narrative   Additional Social History:  Musculoskeletal: Strength & Muscle Tone: within normal limits Gait & Station: normal Patient leans: N/A  Psychiatric Specialty Exam: Physical Exam  Review of Systems  Constitutional: Negative for fever and chills.  Respiratory: Negative for cough and shortness of breath.   Cardiovascular: Negative for chest pain.  Gastrointestinal: Negative for nausea and vomiting.  Genitourinary: Negative for dysuria, urgency and frequency.  Skin: Negative for rash.  Neurological: Negative for seizures and headaches.  Psychiatric/Behavioral: Positive for depression, suicidal ideas and substance abuse.    Blood pressure 118/80, pulse 100, temperature 97.5 F (36.4 C), temperature source Oral, resp. rate 20, height 5' 11.5" (1.816 m), weight 159 lb 12 oz (72.462 kg).Body mass index is 21.97 kg/(m^2).  General Appearance: Fairly Groomed  Patent attorney::  Good  Speech:  Normal Rate  Volume:  Normal  Mood:  mildly depressed, dysphoric, but denies depression at this time and states his mood is "OK"  Affect:  Appropriate and mildly dysphoric  Thought Process:  Linear  Orientation:  Full (Time, Place, and Person)  Thought Content:  denies hallucinations, no delusions, does not appear internally preoccupied   Suicidal Thoughts:  No at this time denies any thoughts of hurting self and  contracts for safety on unit    Homicidal Thoughts:  No  Memory:  recent and remote grossly intact   Judgement:  Fair  Insight:  Fair  Psychomotor Activity:  Normal  Concentration:  Good  Recall:  Good  Fund of Knowledge:Good  Language: Good  Akathisia:  Negative  Handed:  Right  AIMS (if indicated):     Assets:  Desire for Improvement Resilience Social Support  ADL's:  Fair   Cognition: fully alert and attentive   Sleep:  Number of Hours: 5   Risk to Self: Is patient at risk for suicide?: Yes Risk to Others:   Prior Inpatient Therapy:   Prior Outpatient Therapy:    Alcohol Screening: 1. How often do you have a drink containing alcohol?: Monthly or less 2. How many drinks containing alcohol do you have on a typical day when you are drinking?: 1 or 2 3. How often do you have six or more drinks on one occasion?: Never Preliminary Score: 0 9. Have you or someone else been injured as a result of your drinking?: No 10. Has a relative or friend or a doctor or another health worker been concerned about your drinking or suggested you cut down?:  No Alcohol Use Disorder Identification Test Final Score (AUDIT): 1 Brief Intervention: AUDIT score less than 7 or less-screening does not suggest unhealthy drinking-brief intervention not indicated  Allergies:   Allergies  Allergen Reactions  . Trazodone And Nefazodone Other (See Comments)    Hallucinations and nightmares   Lab Results:  Results for orders placed or performed during the hospital encounter of 12/30/14 (from the past 48 hour(s))  Glucose, capillary     Status: Abnormal   Collection Time: 12/30/14  5:00 PM  Result Value Ref Range   Glucose-Capillary 40 (LL) 70 - 99 mg/dL  Glucose, capillary     Status: None   Collection Time: 12/30/14  5:23 PM  Result Value Ref Range   Glucose-Capillary 90 70 - 99 mg/dL  Glucose, capillary     Status: Abnormal   Collection Time: 12/30/14  8:09 PM  Result Value Ref Range   Glucose-Capillary 185 (H) 70 - 99 mg/dL   Glucose, capillary     Status: Abnormal   Collection Time: 12/30/14 10:24 PM  Result Value Ref Range   Glucose-Capillary 359 (H) 70 - 99 mg/dL  Glucose, capillary     Status: Abnormal   Collection Time: 12/31/14  6:04 AM  Result Value Ref Range   Glucose-Capillary 187 (H) 70 - 99 mg/dL   Current Medications: Current Facility-Administered Medications  Medication Dose Route Frequency Provider Last Rate Last Dose  . acetaminophen (TYLENOL) tablet 650 mg  650 mg Oral Q6H PRN Truman Hayward, FNP      . alum & mag hydroxide-simeth (MAALOX/MYLANTA) 200-200-20 MG/5ML suspension 30 mL  30 mL Oral Q4H PRN Truman Hayward, FNP      . enoxaparin (LOVENOX) injection 40 mg  40 mg Subcutaneous Q24H Truman Hayward, FNP   40 mg at 12/31/14 0814  . insulin aspart (novoLOG) injection 0-15 Units  0-15 Units Subcutaneous TID WC Truman Hayward, FNP   3 Units at 12/31/14 [phone]  . insulin aspart (novoLOG) injection 4 Units  4 Units Subcutaneous TID WC Truman Hayward, FNP   4 Units at 12/31/14 [phone]  . insulin detemir (LEVEMIR) injection 29 Units  29 Units Subcutaneous Q lunch Truman Hayward, FNP      . levalbuterol [facility]) nebulizer solution 0.63 mg  0.63 mg Nebulization Q6H PRN Truman Hayward, FNP      . magnesium hydroxide (MILK OF MAGNESIA) suspension 30 mL  30 mL Oral Daily PRN Truman Hayward, FNP      . ondansetron (ZOFRAN) tablet 4 mg  4 mg Oral Q6H PRN Truman Hayward, FNP       Or  . ondansetron (ZOFRAN) injection 4 mg  4 mg Intravenous Q6H PRN Truman Hayward, FNP      . pantoprazole (PROTONIX) EC tablet 40 mg  40 mg Oral Daily Rachael Fee, MD   40 mg at 12/31/14 5409   PTA Medications: Prescriptions prior to admission  Medication Sig Dispense Refill Last Dose  . cyclobenzaprine (FLEXERIL) 10 MG tablet Take 1 tablet (10 mg total) by mouth every 8 (eight) hours as needed. (Patient taking differently: Take 10 mg by mouth every 8 (eight) hours as needed for muscle spasms. ) 30 tablet 0 unknown   . hydrOXYzine (ATARAX/VISTARIL) 10 MG tablet Take 10 mg by mouth 3 (three) times daily as needed for itching.     . insulin aspart (NOVOLOG) 100 UNIT/ML injection Inject 10 Units into the skin 3 (three) times daily with  meals. (Patient taking differently: Inject 5-20 Units into the skin 3 (three) times daily with meals. Based on sliding scale.) 10 mL 11 12/25/2014 at Unknown time  . insulin detemir (LEVEMIR) 100 UNIT/ML injection Inject 0.29 mLs (29 Units total) into the skin at bedtime. 10 mL 11 12/25/2014 at Unknown time  . meloxicam (MOBIC) 15 MG tablet Take 1 tablet (15 mg total) by mouth daily. 7 tablet 0 unknown    Previous Psychotropic Medications:patient was not taking any psychiatric medications other than xanax, which he states he took PRN for anxiety  Substance Abuse History in the last 12 months:  Yes.  - history of opiate and methamphetamine dependencies. States he has not relapsed since his recent discharge from inpatient unit in January    Consequences of Substance Abuse: Legal Consequences:  facing charges for drug related charges Family Consequences:  strained relationship with parents at times  Results for orders placed or performed during the hospital encounter of 12/30/14 (from the past 72 hour(s))  Glucose, capillary     Status: Abnormal   Collection Time: 12/30/14  5:00 PM  Result Value Ref Range   Glucose-Capillary 40 (LL) 70 - 99 mg/dL  Glucose, capillary     Status: None   Collection Time: 12/30/14  5:23 PM  Result Value Ref Range   Glucose-Capillary 90 70 - 99 mg/dL  Glucose, capillary     Status: Abnormal   Collection Time: 12/30/14  8:09 PM  Result Value Ref Range   Glucose-Capillary 185 (H) 70 - 99 mg/dL  Glucose, capillary     Status: Abnormal   Collection Time: 12/30/14 10:24 PM  Result Value Ref Range   Glucose-Capillary 359 (H) 70 - 99 mg/dL  Glucose, capillary     Status: Abnormal   Collection Time: 12/31/14  6:04 AM  Result Value Ref Range    Glucose-Capillary 187 (H) 70 - 99 mg/dL    Assessment- Patient is a 34 year old man, known to us from prior psychiatric admission in January/2016. Has a history of substance dependence, primarily opiates and methamphetamine. Has a history of DM type I . Impulsively overdosed on Xanax. States he was not particularly depressed prior to overdose, but reports acute stressors- arguments with loved ones as a trigger. He was also experiencing drug cravings that day, and did not want to relapse. Patient also states his glycemias, particularly when very high or low, make his mood worse and make him more impulsive. Now improved, minimizes depression, but does endorse mild dysphoria and insomnia. Agrees to REMERON trial.   Observation Level/Precautions:  15 minute checks  Laboratory:  as needed   Psychotherapy:  Milieu, supportive  Medications:  Remeron , start at 7.5 mgrs QHS  Consultations:  As needed   Discharge Concerns:  Substance Abuse History  Estimated LOS: 6 days   Other:     Psychological Evaluations: No   Treatment Plan Summary: Daily contact with patient to assess and evaluate symptoms and progress in treatment, Medication management, Plan inpatient medication management and medications as below  Remeron 7.5 mgrs QHS  Medical Decision Making:  Review of Psycho-Social Stressors (1), Review or order clinical lab tests (1), Established Problem, Worsening (2), Review of Medication Regimen & Side Effects (2) and Review of New Medication or Change in Dosage (2)  I certify that inpatient services furnished can reasonably be expected to improve the patient's condition.   COBOS, FERNANDO 2/12/20169:16 AM

## 2014-12-31 NOTE — Consult Note (Signed)
[facility] Medical Consultation  Tyler Mann ZOX:[id number] DOB: 01-06-1981 DOA: 12/30/2014 PCP: Catalina PizzaHALL, ZACH, MD   Requesting physician: Dr Dub MikesLugo Date of consultation: 12/31/2014 Reason for consultation: Bruits or type 1 diabetes mellitus  Assessment I was asked to see Tyler Mann by Dr Dub MikesLugo for fluctuating blood sugars. Tyler Mann is a pleasant 34 year old male with diabetes mellitus type 1 which he has managed for the past 30 years or so, and was admitted to behavioral health after an overdose with insulin/Xanax requiring ventilator support [facility] patient was [facility](Please refer to Dr Hardie ShackletonHernandez's comprehensive discharge summary on 12/29/14). In any case, sugars were noted to be greater than 300 mg/dl here. Patient tells me that he ate too many carbs without adequate meal coverage. He says he normally takes 35 units of Levemir and 10 units of short-acting insulin to cover meals at home but apparently he was receiving only 3-4 units with meals here. He is on 29 units of Levemir inhouse and prefers for it to stay that way instead of the 35 units he takes at home as he says he does not eat as much in the hospital and is therefore worried that he might become hypoglycemic. At this point, would recommend to continue home regimen and sliding scale insulin as you are doing. We will follow along and make further adjustments as necessary. Patient is reluctant for me to make any adjustments at present. Recommendations Diabetes mellitus type 1  Continue current insulin regimen and plan to make gradual adjustments as necessary going forward. It seems that the current interventions already taking effect as fingersticks less than 300 mg/dL already.  Diabetic diet MDD (major depressive disorder), recurrent episode, severe  Deferred to psychiatry  Thank you for this consultation. We will followup again tomorrow. Please contact me if I can be of  assistance in the meanwhile.   Chief Complaint: High sugars  HPI:  Tyler Mann is a pleasant 34 year old male with type 1 diabetes mellitus who is admitted for major depressive disorder which culminated in drug overdose [facility]. He has been transferred here for management of depression but was noted to have high sugars above 300 mg/dl hence request for medical consultation. He was not on home doses of insulin regimen until this afternoon and states that he also admits to many carbs. The insulin dosage has been adjusted and sugars seem to be improving. He has no complaints to suggest infection or acidosis. He tells me he knows about his diabetes more than anybody else because he has managed it for the last 35 years. He says that stress(which he has been under lately) and other exogenous factors affect the sugars.  Review of Systems:  As in the hospital course summary above.  Past Medical History  Diagnosis Date  . Diabetes mellitus   . Neuropathy   . Narcotic addiction     Hx IV heroin abuse  . Chronic back pain    Past Surgical History  Procedure Laterality Date  . Mouth surgery     Social History:  reports that he has been smoking Cigarettes.  He has a 17 pack-year smoking history. He uses smokeless tobacco. He reports that he uses illicit drugs (Methamphetamines and Heroin). He reports that he does not drink alcohol.  Allergies  Allergen Reactions  . Trazodone And Nefazodone Other (See Comments)    Hallucinations and nightmares   History reviewed. No pertinent family history.  Prior  to Admission medications   Medication Sig Start Date End Date Taking? Authorizing Provider  cyclobenzaprine (FLEXERIL) 10 MG tablet Take 1 tablet (10 mg total) by mouth every 8 (eight) hours as needed. Patient taking differently: Take 10 mg by mouth every 8 (eight) hours as needed for muscle spasms.  12/13/14   Fransisca Kaufmann, NP  hydrOXYzine (ATARAX/VISTARIL) 10 MG  tablet Take 10 mg by mouth 3 (three) times daily as needed for itching.    Historical Provider, MD  insulin aspart (NOVOLOG) 100 UNIT/ML injection Inject 10 Units into the skin 3 (three) times daily with meals. Patient taking differently: Inject 5-20 Units into the skin 3 (three) times daily with meals. Based on sliding scale. 12/13/14   Fransisca Kaufmann, NP  insulin detemir (LEVEMIR) 100 UNIT/ML injection Inject 0.29 mLs (29 Units total) into the skin at bedtime. 12/13/14   Fransisca Kaufmann, NP  meloxicam (MOBIC) 15 MG tablet Take 1 tablet (15 mg total) by mouth daily. 12/13/14   Fransisca Kaufmann, NP   Physical Exam: Blood pressure 118/80, pulse 100, temperature 97.5 F (36.4 C), temperature source Oral, resp. rate 20, height 5' 11.5" (1.816 m), weight 72.462 kg (159 lb 12 oz). Filed Vitals:   12/31/14 0610  BP: 118/80  Pulse: 100  Temp:   Resp:      General:  Comfortable at rest  Eyes: Normal  ENT: Normal  Neck: No JVD  Cardiovascular: S1-S2 normal. No murmurs. RRR.  Respiratory: Good air entry bilaterally. No rhonchi or rails.  Abdomen: Soft and nontender. Normal bowel sounds. No organomegaly.  Skin: Intact  Musculoskeletal: No pedal edema.  Psychiatric: Normal  Neurologic: Grossly intact.  Labs on Admission:  Basic Metabolic Panel:  Recent Labs Lab 12/26/14 0047 12/26/14 0439 12/27/14 0500 12/30/14 1100  NA 140  --  140 132*  K 3.6  --  3.9 5.0  CL 105  --  106 95*  CO2 30  --  26 25  GLUCOSE 117* 105* 143* 491*  BUN 24*  --  7 23  CREATININE 0.79  --  0.69 0.84  CALCIUM 9.3  --  8.6 9.0   Liver Function Tests:  Recent Labs Lab 12/26/14 0047  AST 24  ALT 28  ALKPHOS 84  BILITOT 0.3  PROT 7.4  ALBUMIN 3.5   No results for input(s): LIPASE, AMYLASE in the last 168 hours. No results for input(s): AMMONIA in the last 168 hours. CBC:  Recent Labs Lab 12/26/14 0047 12/27/14 0500  WBC 6.9 6.8  NEUTROABS 2.5  --   HGB 10.8* 10.6*  HCT 33.6* 34.7*  MCV 87.0  90.1  PLT 391 389   Cardiac Enzymes: No results for input(s): CKTOTAL, CKMB, CKMBINDEX, TROPONINI in the last 168 hours. BNP: Invalid input(s): POCBNP CBG:  Recent Labs Lab 12/30/14 2009 12/30/14 2224 12/31/14 0604 12/31/14 1131 12/31/14 1705  GLUCAP 185* 359* 187* 500* 227*    Radiological Exams on Admission: No results found.  EKG: Independently reviewed.   Time spent: 55 minutes  Chatara Lucente [facility] Pager [phone]  If 7PM-7AM, please contact night-coverage [email or web address] Password [facility] 12/31/2014, 7:48 PM

## 2014-12-31 NOTE — Plan of Care (Signed)
Problem: Alteration in mood Goal: LTG-Patient reports reduction in suicidal thoughts (Patient reports reduction in suicidal thoughts and is able to verbalize a safety plan for whenever patient is feeling suicidal)  Outcome: Progressing Pt denied SI this shift.       

## 2014-12-31 NOTE — Progress Notes (Signed)
D: Pt has anxious affect and anxious mood.  Pt reports he had a good visit with his parents.  He reports that the diabetic coordinator met with him today.  He reports "someone saw me tonight too."  Pt reports that the day he overdosed it was because "sensory overload.  One of them days.  I just need to remember that there are things I can't control and I need to not get so worked up over things I have no control over."  Pt denies SI/HI, denies hallucinations.  He reports he would like to try to sleep without scheduled night medication.  He attended evening group.  His affect is brighter today than last night.  Pt reports the only carbohydrates he had for dinner consisted of "about 4 ounces of beans and rice and some breading on the shrimp."   A: Notified pt that he has scheduled HS medication and pt reported he would like to try to sleep without it.  CBG was checked this evening and was 71 mg/dL.  Pt provided with sandwich from cafeteria, which he ate.  Met with pt 1:1 and provided support and encouragement.  Actively listened to pt.   R: Pt verbally contracts for safety.  He reports he will notify staff of needs and concerns.  Will continue to monitor and assess for safety.
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
## 2014-12-31 NOTE — Progress Notes (Signed)
D: Pt has irritable affect and mood.  Pt reports his day has been "good."  Pt reports "I probably need some insulin."  Pt denies SI/HI, denies hallucinations, denies pain.  Pt has been interacting with peers and playing cards with peers.  Pt is focused on getting insulin this evening.  A: On-call provider, Milta Deiters, notified of pt's HS CBG of 359 mg/dL.  On-call provider notified that pt has no HS insulin coverage.  No new orders given by on-call provider.  Met with pt 1:1 and provided support and encouragement.  Safety maintained.   R: Pt verbally contracts for safety.  He reports he will notify staff of needs and concerns.  Will continue to monitor and assess for safety.

## 2014-12-31 NOTE — BHH Suicide Risk Assessment (Signed)
[facility] Admission Suicide Risk Assessment   Nursing information obtained from:  Patient, Review of record Demographic factors:  Male, Caucasian, Low socioeconomic status, Unemployed Current Mental Status:  Self-harm behaviors (patient states, "I was never suicidal." see DAR) Loss Factors:  Financial problems / change in socioeconomic status, Legal issues, Decline in physical health, Decrease in vocational status Historical Factors:  Prior suicide attempts, Impulsivity Risk Reduction Factors:  Sense of responsibility to family, Living with another person, especially a relative Total Time spent with patient: 45 minutes Principal Problem:  Depression, dysphoria, history of substance dependence, status post benzodiazepine overdose Diagnosis:   Patient Active Problem List   Diagnosis Date Noted  . MDD (major depressive disorder), recurrent episode, severe [F33.2] 12/30/2014  . Drug overdose, intentional [T50.902A] 12/26/2014  . Hypoglycemia [E16.2] 12/26/2014  . Overdose of benzodiazepine [T42.4X1A]   . Overdose of insulin [T38.3X1A]   . Substance induced mood disorder [F19.94] 12/08/2014  . Hyperglycemia without ketosis [R73.9]   . Diabetes type 1, uncontrolled [E10.65]   . Suicidal ideation [R45.851] 12/05/2014    Class: Acute  . Hyperglycemia [R73.9] 12/03/2014  . Substance abuse [F19.10] 12/03/2014  . Hypokalemia [E87.6] 11/03/2014  . Leukocytosis [D72.829] 11/02/2014  . Hyperkalemia [E87.5] 11/02/2014  . Chronic back pain [M54.9, G89.29] 11/02/2014  . Hematemesis [K92.0] 11/02/2014  . Generalized anxiety disorder [F41.1] 11/02/2014  . DKA (diabetic ketoacidoses) [E13.10] 11/19/2013  . DKA, type 1 [E10.10] 11/22/2012  . Opiate dependence [F11.20] 11/22/2012  . Tobacco abuse [Z72.0] 11/22/2012  . Diabetic nephropathy [E11.21] 11/22/2012  . Diabetic ketoacidosis [E13.10] 09/20/2012  . Diabetes mellitus type 1 [E10.9] 09/20/2012  . Chronic pain [G89.29] 09/20/2012  . Hyperlipidemia  [E78.5] 09/20/2012     Continued Clinical Symptoms:  Alcohol Use Disorder Identification Test Final Score (AUDIT): 1 The "Alcohol Use Disorders Identification Test", Guidelines for Use in Primary Care, Second Edition.  World Science writer [facility]). Score between 0-7:  no or low risk or alcohol related problems. Score between 8-15:  moderate risk of alcohol related problems. Score between 16-19:  high risk of alcohol related problems. Score 20 or above:  warrants further diagnostic evaluation for alcohol dependence and treatment.   CLINICAL FACTORS:  Patient reports impulsive overdose on xanax, related to psychosocial stressors, and also suspects poorly controlled diabetes/ glycemic control contributes to impulsivity. History of opiate and methamphetamine dependencies, but sober for several weeks.    Musculoskeletal: Strength & Muscle Tone: within normal limits Gait & Station: normal Patient leans: N/A  Psychiatric Specialty Exam: Physical Exam  ROS  Blood pressure 118/80, pulse 100, temperature 97.5 F (36.4 C), temperature source Oral, resp. rate 20, height 5' 11.5" (1.816 m), weight 159 lb 12 oz (72.462 kg).Body mass index is 21.97 kg/(m^2).  SEE ADMIT NOTE MSE   COGNITIVE FEATURES THAT CONTRIBUTE TO RISK:  Closed-mindedness    SUICIDE RISK:   Moderate:  Frequent suicidal ideation with limited intensity, and duration, some specificity in terms of plans, no associated intent, good self-control, limited dysphoria/symptomatology, some risk factors present, and identifiable protective factors, including available and accessible social support.  PLAN OF CARE: Patient will be admitted to inpatient psychiatric unit for stabilization and safety. Will provide and encourage milieu participation. Provide medication management and maked adjustments as needed.  Will follow daily.    Medical Decision Making:  Review of Psycho-Social Stressors (1), Review or order clinical lab tests (1),  Established Problem, Worsening (2) and Review of New Medication or Change in Dosage (2)  I certify that inpatient  services furnished can reasonably be expected to improve the patient's condition.   COBOS, FERNANDO 12/31/2014, 11:00 AM

## 2015-01-01 DIAGNOSIS — F332 Major depressive disorder, recurrent severe without psychotic features: Principal | ICD-10-CM

## 2015-01-01 LAB — BASIC METABOLIC PANEL
Anion gap: 7 (ref 5–15)
BUN: 28 mg/dL — ABNORMAL HIGH (ref 6–23)
CO2: 29 mmol/L (ref 19–32)
CREATININE: 0.79 mg/dL (ref 0.50–1.35)
Calcium: 8.9 mg/dL (ref 8.4–10.5)
Chloride: 99 mmol/L (ref 96–112)
GFR calc Af Amer: >90 mL/min (ref >90)
GFR calc non Af Amer: >90 mL/min (ref >90)
GLUCOSE: 198 mg/dL — AB (ref 70–99)
Potassium: 4.3 mmol/L (ref 3.5–5.1)
Sodium: 135 mmol/L (ref 135–145)

## 2015-01-01 LAB — CBC
HCT: 34.9 % — ABNORMAL LOW (ref 39.0–52.0)
HEMOGLOBIN: 10.8 g/dL — AB (ref 13.0–17.0)
MCH: 27.4 pg (ref 26.0–34.0)
MCHC: 30.9 g/dL (ref 30.0–36.0)
MCV: 88.6 fL (ref 78.0–100.0)
Platelets: 406 10*3/uL — ABNORMAL HIGH (ref 150–400)
RBC: 3.94 MIL/uL — ABNORMAL LOW (ref 4.22–5.81)
RDW: 15.2 % (ref 11.5–15.5)
WBC: 15 10*3/uL — ABNORMAL HIGH (ref 4.0–10.5)

## 2015-01-01 LAB — GLUCOSE, CAPILLARY
GLUCOSE-CAPILLARY: 355 mg/dL — AB (ref 70–99)
Glucose-Capillary: 167 mg/dL — ABNORMAL HIGH (ref 70–99)
Glucose-Capillary: 407 mg/dL — ABNORMAL HIGH (ref 70–99)
Glucose-Capillary: 172 mg/dL — ABNORMAL HIGH (ref 70–99)
Glucose-Capillary: 145 mg/dL — ABNORMAL HIGH (ref 70–99)

## 2015-01-01 MED ORDER — INSULIN ASPART 100 UNIT/ML ~~LOC~~ SOLN: 0.0000 [IU] | Freq: Every day | SUBCUTANEOUS | Status: DC

## 2015-01-01 MED ORDER — NICOTINE 21 MG/24HR TD PT24
MEDICATED_PATCH | TRANSDERMAL | Status: AC
  Filled 2015-01-01: qty 1

## 2015-01-01 MED ORDER — INSULIN ASPART 100 UNIT/ML ~~LOC~~ SOLN
0.0000 [IU] | Freq: Three times a day (TID) | SUBCUTANEOUS | Status: DC
  Administered 2015-01-01: 15 [IU] via SUBCUTANEOUS
  Administered 2015-01-01: 3 [IU] via SUBCUTANEOUS
  Administered 2015-01-02: 5 [IU] via SUBCUTANEOUS

## 2015-01-01 NOTE — Progress Notes (Signed)
.  Psychoeducational Group Note    Date: 01/01/2015 Time:  0930    Goal Setting Purpose of Group: To be able to set a goal that is measurable and that can be accomplished in one day  Participation Level:  Active  Participation Quality:  Attentive  Affect:  Appropriate  Cognitive:  Oriented  Insight:  Improving  Engagement in Group:  Engaged  Additional Comments:  Pt participated in the group and shared ideas and his goal for today  Tyler Mann A 

## 2015-01-01 NOTE — Progress Notes (Signed)
Pt came to nurse's station and requested scheduled HS medication, reporting he was unable to sleep.  Scheduled HS medication administered.  Will continue to monitor and assess for safety.

## 2015-01-01 NOTE — Progress Notes (Addendum)
Tyler Mann is seen OOB UAL on the unit today..he tolerates this well. HE is loud, boisterous and enjoys joking and laughing with his peers and the staff. He takes his meds as ordered and he completes his morning assessment this morning and on it he wrote he denied SI  within the past 24 hrs and he rated his depression, hopelessness ad anxiety "0/0/2", respectively.    A He attends his groups and is actively trying to identify unhealthy ways he has of coping....and also identify new healthier ways to get his needs met. These " grey " concepts are quite difficult for him to embrace but his conversation, his willingness to process with staff and his consistent conversation today are signs he wants to get " unstuck".   R Safety is in place.

## 2015-01-01 NOTE — Progress Notes (Signed)
[facility] MD Progress Note  01/01/2015 4:33 PM Tyler Mann  MRN:  [id number] Subjective:  "I'm ok. " O:  34 year old male  known to [facility] from recent psychiatric admission January/2016.   He states that he was doing well but recent stress from family members affected his blood sugars.  He states, having severe cravings for drugs ( has a history of methamphetamine and opiate dependencies) but states he did not use.  Patient seen today and he stated he was ok.  He also was observed to attend in groups and interact well with others.  Hospitalist came to see patient and adjustments made to insulin regimen.    Principal Problem: MDD (major depressive disorder), recurrent episode, severe Diagnosis:   Patient Active Problem List   Diagnosis Date Noted  . MDD (major depressive disorder), recurrent episode, severe [F33.2] 12/30/2014  . Drug overdose, intentional [T50.902A] 12/26/2014  . Hypoglycemia [E16.2] 12/26/2014  . Overdose of benzodiazepine [T42.4X1A]   . Overdose of insulin [T38.3X1A]   . Substance induced mood disorder [F19.94] 12/08/2014  . Hyperglycemia without ketosis [R73.9]   . Diabetes type 1, uncontrolled [E10.65]   . Suicidal ideation [R45.851] 12/05/2014    Class: Acute  . Hyperglycemia [R73.9] 12/03/2014  . Substance abuse [F19.10] 12/03/2014  . Hypokalemia [E87.6] 11/03/2014  . Leukocytosis [D72.829] 11/02/2014  . Hyperkalemia [E87.5] 11/02/2014  . Chronic back pain [M54.9, G89.29] 11/02/2014  . Hematemesis [K92.0] 11/02/2014  . Generalized anxiety disorder [F41.1] 11/02/2014  . DKA (diabetic ketoacidoses) [E13.10] 11/19/2013  . DKA, type 1 [E10.10] 11/22/2012  . Opiate dependence [F11.20] 11/22/2012  . Tobacco abuse [Z72.0] 11/22/2012  . Diabetic nephropathy [E11.21] 11/22/2012  . Diabetic ketoacidosis [E13.10] 09/20/2012  . Diabetes mellitus type 1 [E10.9] 09/20/2012  . Chronic pain [G89.29] 09/20/2012  . Hyperlipidemia [E78.5] 09/20/2012   Total Time spent with  patient: 30 minutes  Past Medical History:  Past Medical History  Diagnosis Date  . Diabetes mellitus   . Neuropathy   . Narcotic addiction     Hx IV heroin abuse  . Chronic back pain     Past Surgical History  Procedure Laterality Date  . Mouth surgery     Family History: History reviewed. No pertinent family history. Social History:  History  Alcohol Use No     History  Drug Use  . Yes  . Special: Methamphetamines, Heroin    Comment: recovering from heroin-     History   Social History  . Marital Status: Single    Spouse Name: N/A  . Number of Children: N/A  . Years of Education: N/A   Social History Main Topics  . Smoking status: Current Every Day Smoker -- 1.00 packs/day for 17 years    Types: Cigarettes  . Smokeless tobacco: Current User  . Alcohol Use: No  . Drug Use: Yes    Special: Methamphetamines, Heroin     Comment: recovering from heroin-   . Sexual Activity: Yes   Other Topics Concern  . None   Social History Narrative   Additional History:    Sleep: Good  Appetite:  Good  Assessment:   Musculoskeletal: Strength & Muscle Tone: within normal limits Gait & Station: normal Patient leans: N/A  Psychiatric Specialty Exam: Physical Exam  Vitals reviewed. Psychiatric: His behavior is normal. Thought content normal.    Review of Systems  Constitutional: Negative.   HENT: Negative.   Eyes: Negative.   Respiratory: Negative.   Cardiovascular: Negative.   Gastrointestinal: Negative.  Genitourinary: Negative.   Musculoskeletal: Negative.   Skin: Negative.   Neurological: Negative.   Endo/Heme/Allergies: Negative.   Psychiatric/Behavioral: The patient is nervous/anxious.     Blood pressure 121/72, pulse 103, temperature 98.2 F (36.8 C), temperature source Oral, resp. rate 18, height 5' 11.5" (1.816 m), weight 72.462 kg (159 lb 12 oz).Body mass index is 21.97 kg/(m^2).   General Appearance: Fairly Groomed  Engineer, water:: Good   Speech: Normal Rate  Volume: Normal  Mood: mildly depressed, dysphoric, but denies depression at this time and states his mood is "OK"  Affect: Appropriate and mildly dysphoric  Thought Process: Linear  Orientation: Full (Time, Place, and Person)  Thought Content: denies hallucinations, no delusions, does not appear internally preoccupied   Suicidal Thoughts: No at this time denies any thoughts of hurting self and contracts for safety on unit   Homicidal Thoughts: No  Memory: recent and remote grossly intact   Judgement: Fair  Insight: Fair  Psychomotor Activity: Normal  Concentration: Good  Recall: Good  Fund of Knowledge:Good  Language: Good  Akathisia: Negative  Handed: Right  AIMS (if indicated):    Assets: Desire for Improvement Resilience Social Support  ADL's: Fair   Cognition: fully alert and attentive   Sleep: Number of Hours: 5        Current Medications: Current Facility-Administered Medications  Medication Dose Route Frequency Provider Last Rate Last Dose  . acetaminophen (TYLENOL) tablet 650 mg  650 mg Oral Q6H PRN Nanci Pina, FNP      . alum & mag hydroxide-simeth (MAALOX/MYLANTA) 200-200-20 MG/5ML suspension 30 mL  30 mL Oral Q4H PRN Nanci Pina, FNP      . enoxaparin (LOVENOX) injection 40 mg  40 mg Subcutaneous Q24H Nanci Pina, FNP   40 mg at 12/31/14 0814  . insulin aspart (novoLOG) injection 0-15 Units  0-15 Units Subcutaneous TID WC Venetia Maxon Rama, MD   15 Units at 01/01/15 1248  . insulin aspart (novoLOG) injection 0-5 Units  0-5 Units Subcutaneous QHS Christina P Rama, MD      . insulin aspart (novoLOG) injection 10 Units  10 Units Subcutaneous TID WC Encarnacion Slates, NP   10 Units at 01/01/15 1247  . insulin detemir (LEVEMIR) injection 29 Units  29 Units Subcutaneous Q lunch Nanci Pina, FNP   29 Units at 01/01/15 1247  . levalbuterol (XOPENEX) nebulizer solution 0.63 mg  0.63 mg  Nebulization Q6H PRN Nanci Pina, FNP      . magnesium hydroxide (MILK OF MAGNESIA) suspension 30 mL  30 mL Oral Daily PRN Nanci Pina, FNP      . mirtazapine (REMERON SOL-TAB) disintegrating tablet 7.5 mg  7.5 mg Oral QHS Myer Peer Sachin Ferencz, MD   7.5 mg at 01/01/15 0105  . ondansetron (ZOFRAN) tablet 4 mg  4 mg Oral Q6H PRN Nanci Pina, FNP       Or  . ondansetron (ZOFRAN) injection 4 mg  4 mg Intravenous Q6H PRN Nanci Pina, FNP      . pantoprazole (PROTONIX) EC tablet 40 mg  40 mg Oral Daily Nicholaus Bloom, MD   40 mg at 12/31/14 5883    Lab Results:  Results for orders placed or performed during the hospital encounter of 12/30/14 (from the past 48 hour(s))  Glucose, capillary     Status: Abnormal   Collection Time: 12/30/14  5:00 PM  Result Value Ref Range   Glucose-Capillary 40 (LL) 70 -  99 mg/dL  Glucose, capillary     Status: None   Collection Time: 12/30/14  5:23 PM  Result Value Ref Range   Glucose-Capillary 90 70 - 99 mg/dL  Glucose, capillary     Status: Abnormal   Collection Time: 12/30/14  8:09 PM  Result Value Ref Range   Glucose-Capillary 185 (H) 70 - 99 mg/dL  Glucose, capillary     Status: Abnormal   Collection Time: 12/30/14 10:24 PM  Result Value Ref Range   Glucose-Capillary 359 (H) 70 - 99 mg/dL  Glucose, capillary     Status: Abnormal   Collection Time: 12/31/14  6:04 AM  Result Value Ref Range   Glucose-Capillary 187 (H) 70 - 99 mg/dL  Glucose, capillary     Status: Abnormal   Collection Time: 12/31/14 11:31 AM  Result Value Ref Range   Glucose-Capillary 500 (H) 70 - 99 mg/dL   Comment 1 Document in Chart   Glucose, capillary     Status: Abnormal   Collection Time: 12/31/14  5:05 PM  Result Value Ref Range   Glucose-Capillary 227 (H) 70 - 99 mg/dL  Glucose, capillary     Status: None   Collection Time: 12/31/14  9:02 PM  Result Value Ref Range   Glucose-Capillary 71 70 - 99 mg/dL  Glucose, capillary     Status: Abnormal   Collection  Time: 12/31/14 11:28 PM  Result Value Ref Range   Glucose-Capillary 167 (H) 70 - 99 mg/dL  Glucose, capillary     Status: Abnormal   Collection Time: 01/01/15  6:26 AM  Result Value Ref Range   Glucose-Capillary 167 (H) 70 - 99 mg/dL  CBC     Status: Abnormal   Collection Time: 01/01/15  6:30 AM  Result Value Ref Range   WBC 15.0 (H) 4.0 - 10.5 K/uL   RBC 3.94 (L) 4.22 - 5.81 MIL/uL   Hemoglobin 10.8 (L) 13.0 - 17.0 g/dL   HCT 34.9 (L) 39.0 - 52.0 %   MCV 88.6 78.0 - 100.0 fL   MCH 27.4 26.0 - 34.0 pg   MCHC 30.9 30.0 - 36.0 g/dL   RDW 15.2 11.5 - 15.5 %   Platelets 406 (H) 150 - 400 K/uL    Comment: Performed at [facility] panel     Status: Abnormal   Collection Time: 01/01/15  6:30 AM  Result Value Ref Range   Sodium 135 135 - 145 mmol/L   Potassium 4.3 3.5 - 5.1 mmol/L   Chloride 99 96 - 112 mmol/L   CO2 29 19 - 32 mmol/L   Glucose, Bld 198 (H) 70 - 99 mg/dL   BUN 28 (H) 6 - 23 mg/dL   Creatinine, Ser 0.79 0.50 - 1.35 mg/dL   Calcium 8.9 8.4 - 10.5 mg/dL   GFR calc non Af Amer >90 >90 mL/min   GFR calc Af Amer >90 >90 mL/min    Comment: (NOTE) The eGFR has been calculated using the CKD EPI equation. This calculation has not been validated in all clinical situations. eGFR's persistently <90 mL/min signify possible Chronic Kidney Disease.    Anion gap 7 5 - 15    Comment: Performed at [facility]  Glucose, capillary     Status: Abnormal   Collection Time: 01/01/15 12:10 PM  Result Value Ref Range   Glucose-Capillary 407 (H) 70 - 99 mg/dL  Glucose, capillary     Status: Abnormal   Collection Time: 01/01/15  1:22 PM  Result Value Ref Range   Glucose-Capillary 355 (H) 70 - 99 mg/dL    Physical Findings: AIMS: Facial and Oral Movements Muscles of Facial Expression: None, normal Lips and Perioral Area: None, normal Jaw: None, normal Tongue: None, normal,Extremity Movements Upper (arms, wrists, hands,  fingers): None, normal Lower (legs, knees, ankles, toes): None, normal, Trunk Movements Neck, shoulders, hips: None, normal, Overall Severity Severity of abnormal movements (highest score from questions above): None, normal Incapacitation due to abnormal movements: None, normal Patient's awareness of abnormal movements (rate only patient's report): No Awareness, Dental Status Current problems with teeth and/or dentures?: No Does patient usually wear dentures?: No  CIWA:    COWS:     Treatment Plan Summary: Daily contact with patient to assess and evaluate symptoms and progress in treatment and Medication management   Medical Decision Making:  Established Problem, Stable/Improving (1), Review of Psycho-Social Stressors (1), Discuss test with performing physician (1), Established Problem, Worsening (2), Review of Last Therapy Session (1), Independent Review of image, tracing or specimen (2), Review of Medication Regimen & Side Effects (2) and Review of New Medication or Change in Dosage (2)  AGUSTIN, [place], AGNP-BC 01/01/2015, 4:33 PM  Agree with NP Note, as above

## 2015-01-01 NOTE — Progress Notes (Signed)
Psychoeducational Group Note  Date: 01/01/2015 Time:  1015  Group Topic/Focus:  Identifying Needs:   The focus of this group is to help patients identify their personal needs that have been historically problematic and identify healthy behaviors to address their needs.  Participation Level:  Active  Participation Quality:  Appropriate  Affect:  Appropriate  Cognitive:  Oriented  Insight:  Improving  Engagement in Group:  Engaged  Additional Comments:  Pt attended and added to the group with participation.  Elain Wixon A 

## 2015-01-01 NOTE — Progress Notes (Addendum)
Consult Note:   Paulo FruitJohn E Timoney XWR:[id number] DOB: [birth year] DOA: 12/30/2014 PCP: Catalina PizzaHALL, ZACH, MD  Requesting physician: Dr. Dub MikesLugo  Reason for consultation: Type 1 DM, uncontrolled blood glucoses    Brief Narrative:   Paulo FruitJohn E Wellnitz is an 34 y.o. male with a PMH of type 1 diabetes, depression and heroin abuse who [facility] an overdose on approximately 20 Xanax tablets and 300 units of insulin. He required mechanical ventilation for airway protection and hypoxic respiratory failure.   Assessment/Plan:   Active Problems:   Diabetes mellitus type 1   MDD (major depressive disorder), recurrent episode, severe   CBGs 71-167 overnight. Add at bedtime coverage per DM coordinator recommendations.  Hemoglobin A1c 10.4% on 12/09/2014 indicating poor overall glycemic control.  Suspect he is not compliant with his medications at home.  Subjective:   Paulo FruitJohn E Pompa has been medically stable and without physical complaints.  Objective:    Filed Vitals:   12/31/14 0609 12/31/14 0610 01/01/15 0625 01/01/15 0626  BP: 130/85 118/80 142/79 121/72  Pulse: 93 100 94 103  Temp: 97.5 F (36.4 C)  98.2 F (36.8 C)   TempSrc: Oral  Oral   Resp: 20  18   Height:      Weight:       No intake or output data in the 24 hours ending 01/01/15 [id number]  Exam: Patient not examined today.   Data Reviewed:    Labs: Basic Metabolic Panel:  Recent Labs Lab 12/26/14 0047 12/26/14 0439 12/27/14 0500 12/30/14 1100  NA 140  --  140 132*  K 3.6  --  3.9 5.0  CL 105  --  106 95*  CO2 30  --  26 25  GLUCOSE 117* 105* 143* 491*  BUN 24*  --  7 23  CREATININE 0.79  --  0.69 0.84  CALCIUM 9.3  --  8.6 9.0   GFR Estimated Creatinine Clearance: 125.9 mL/min (by C-G formula based on Cr of 0.84). Liver Function Tests:  Recent Labs Lab 12/26/14 0047  AST 24  ALT 28  ALKPHOS 84  BILITOT 0.3  PROT 7.4  ALBUMIN 3.5   Coagulation  profile  Recent Labs Lab 12/26/14 0412  INR 0.83    CBC:  Recent Labs Lab 12/26/14 0047 12/27/14 0500  WBC 6.9 6.8  NEUTROABS 2.5  --   HGB 10.8* 10.6*  HCT 33.6* 34.7*  MCV 87.0 90.1  PLT 391 389   CBG:  Recent Labs Lab 12/31/14 1131 12/31/14 1705 12/31/14 2102 12/31/14 2328 01/01/15 0626  GLUCAP 500* 227* 71 167* 167*   Lipid Profile:  Recent Labs  12/30/14 0641  TRIG 240*   Microbiology Recent Results (from the past 240 hour(s))  MRSA PCR Screening     Status: None   Collection Time: 12/26/14  3:30 AM  Result Value Ref Range Status   MRSA by PCR NEGATIVE NEGATIVE Final    Comment:        The GeneXpert MRSA Assay (FDA approved for NASAL specimens only), is one component of a comprehensive MRSA colonization surveillance program. It is not intended to diagnose MRSA infection nor to guide or monitor treatment for MRSA infections.      Medications:   . enoxaparin (LOVENOX) injection  40 mg Subcutaneous Q24H  . insulin aspart  0-15 Units Subcutaneous TID WC  . insulin aspart  10 Units Subcutaneous TID WC  . insulin  detemir  29 Units Subcutaneous Q lunch  . mirtazapine  7.5 mg Oral QHS  . pantoprazole  40 mg Oral Daily   Continuous Infusions:   Time spent: 15 minutes.   LOS: 2 days   RAMA,CHRISTINA  [facility] Pager [phone]. If unable to reach me by pager, please call my cell phone at [phone].  *Please refer to [email or web address], password [email or web address] to get updated schedule on who will round on this patient, as hospitalists switch teams weekly. If 7PM-7AM, please contact night-coverage at [email or web address], password [email or web address] for any overnight needs.  01/01/2015, 7:11 AM

## 2015-01-01 NOTE — [facility] Group Notes (Signed)
[facility] Group Notes:  (Clinical Social Work)  01/01/2015     10-11AM  Summary of Progress/Problems:   The main focus of today's process group was to learn how to use a decisional balance exercise to move forward in the Stages of Change, which were described and discussed.  Motivational Interviewing and a worksheet were utilized to help patients explore in depth the perceived benefits and costs of a self-sabotaging behavior, as well as the  benefits and costs of replacing that with a healthy coping mechanism.   The patient expressed himself throughout group well and was invested in talking about his issues, understanding others.  There were times he was monopolizing, going on tangents that were distracting to the topic.  He stated he wants to learn from his mistakes the first time instead of the 40th.  He said his confidence to be able to stop is 9 out of 10, but his confidence that he actually won't use again in the future is 5 out of 10.  He talked about how heroin addiction is different, but was open to comments from others about the reality of their own pain on issues of their own.  Type of Therapy:  Group Therapy - Process   Participation Level:  Active  Participation Quality:  Monopolizing and Redirectable  Affect:  Blunted and Depressed  Cognitive:  Appropriate and Oriented  Insight:  Developing/Improving  Engagement in Therapy:  Engaged  Modes of Intervention:  Education, Motivational Interviewing  Ambrose MantleMareida Grossman-Orr, LCSW 01/01/2015, 5:15 PM

## 2015-01-02 DIAGNOSIS — F332 Major depressive disorder, recurrent severe without psychotic features: Secondary | ICD-10-CM | POA: Insufficient documentation

## 2015-01-02 LAB — GLUCOSE, CAPILLARY
GLUCOSE-CAPILLARY: 78 mg/dL (ref 70–99)
Glucose-Capillary: 218 mg/dL — ABNORMAL HIGH (ref 70–99)
Glucose-Capillary: 301 mg/dL — ABNORMAL HIGH (ref 70–99)
Glucose-Capillary: 143 mg/dL — ABNORMAL HIGH (ref 70–99)

## 2015-01-02 LAB — CREATININE, SERUM
Creatinine, Ser: 0.77 mg/dL (ref 0.50–1.35)
GFR calc non Af Amer: >90 mL/min (ref >90)

## 2015-01-02 MED ORDER — INSULIN ASPART 100 UNIT/ML ~~LOC~~ SOLN: 0.0000 [IU] | Freq: Every day | SUBCUTANEOUS | Status: DC

## 2015-01-02 MED ORDER — NICOTINE 21 MG/24HR TD PT24
MEDICATED_PATCH | TRANSDERMAL | Status: AC
  Administered 2015-01-02: 16:00:00
  Filled 2015-01-02: qty 1

## 2015-01-02 MED ORDER — INSULIN ASPART 100 UNIT/ML ~~LOC~~ SOLN
0.0000 [IU] | Freq: Three times a day (TID) | SUBCUTANEOUS | Status: DC
  Administered 2015-01-02: 3 [IU] via SUBCUTANEOUS
  Administered 2015-01-02: 15 [IU] via SUBCUTANEOUS
  Administered 2015-01-03: 7 [IU] via SUBCUTANEOUS
  Administered 2015-01-03: 4 [IU] via SUBCUTANEOUS
  Administered 2015-01-03: 15 [IU] via SUBCUTANEOUS
  Administered 2015-01-04: 11 [IU] via SUBCUTANEOUS
  Administered 2015-01-04: 7 [IU] via SUBCUTANEOUS

## 2015-01-02 MED ORDER — INSULIN ASPART 100 UNIT/ML ~~LOC~~ SOLN
12.0000 [IU] | Freq: Three times a day (TID) | SUBCUTANEOUS | Status: DC
  Administered 2015-01-02 – 2015-01-03 (×3): 12 [IU] via SUBCUTANEOUS

## 2015-01-02 NOTE — Progress Notes (Signed)
Consult Note:   Tyler Mann UJW:[id number] DOB: March 06, 1981 DOA: 12/30/2014 PCP: Catalina PizzaHALL, ZACH, MD  Requesting physician: Dr. Dub MikesLugo  Reason for consultation: Type 1 DM, uncontrolled blood glucoses    Brief Narrative:   Tyler FruitJohn E Mann is an 34 y.o. male with a PMH of type 1 diabetes, depression and heroin abuse who [facility] for an overdose on approximately 20 Xanax tablets and 300 units of insulin. He required mechanical ventilation for airway protection and hypoxic respiratory failure.   Assessment/Plan:   Principal Problem:   MDD (major depressive disorder), recurrent episode, severe Active Problems:   Diabetes mellitus type 1   CBGs 145-407 over past 24 hours. Change SSI to resistant scale and increase meal coverage to 12 units Q AC.  Hemoglobin A1c 10.4% on 12/09/2014 indicating poor overall glycemic control.  Suspect he is not compliant with his medications at home.  Subjective:   Tyler Mann has been medically stable and without physical complaints.  RN reports he has been compliant with his insulin, but eats a lot of carbs at meals, and that the staff are unable to restrict meal items.  Objective:    Filed Vitals:   12/31/14 0609 12/31/14 0610 01/01/15 0625 01/01/15 0626  BP: 130/85 118/80 142/79 121/72  Pulse: 93 100 94 103  Temp: 97.5 F (36.4 C)  98.2 F (36.8 C)   TempSrc: Oral  Oral   Resp: 20  18   Height:      Weight:       No intake or output data in the 24 hours ending 01/02/15 0704  Exam: Patient not examined today.   Data Reviewed:    Labs: Basic Metabolic Panel:  Recent Labs Lab 12/27/14 0500 12/30/14 1100 01/01/15 0630  NA 140 132* 135  K 3.9 5.0 4.3  CL 106 95* 99  CO2 26 25 29   GLUCOSE 143* 491* 198*  BUN 7 23 28*  CREATININE 0.69 0.84 0.79  CALCIUM 8.6 9.0 8.9   GFR Estimated Creatinine Clearance: 132.2 mL/min (by C-G formula based on Cr of 0.79). Liver Function  Tests: No results for input(s): AST, ALT, ALKPHOS, BILITOT, PROT, ALBUMIN in the last 168 hours. Coagulation profile No results for input(s): INR, PROTIME in the last 168 hours.  CBC:  Recent Labs Lab 12/27/14 0500 01/01/15 0630  WBC 6.8 15.0*  HGB 10.6* 10.8*  HCT 34.7* 34.9*  MCV 90.1 88.6  PLT 389 406*   CBG:  Recent Labs Lab 01/01/15 1210 01/01/15 1322 01/01/15 1656 01/01/15 2051 01/02/15 0553  GLUCAP 407* 355* 172* 145* 218*   Lipid Profile: No results for input(s): CHOL, HDL, LDLCALC, TRIG, CHOLHDL, LDLDIRECT in the last 72 hours. Microbiology Recent Results (from the past 240 hour(s))  MRSA PCR Screening     Status: None   Collection Time: 12/26/14  3:30 AM  Result Value Ref Range Status   MRSA by PCR NEGATIVE NEGATIVE Final    Comment:        The GeneXpert MRSA Assay (FDA approved for NASAL specimens only), is one component of a comprehensive MRSA colonization surveillance program. It is not intended to diagnose MRSA infection nor to guide or monitor treatment for MRSA infections.      Medications:   . enoxaparin (LOVENOX) injection  40 mg Subcutaneous Q24H  . insulin aspart  0-15 Units Subcutaneous TID WC  . insulin aspart  0-5 Units Subcutaneous QHS  .  insulin aspart  10 Units Subcutaneous TID WC  . insulin detemir  29 Units Subcutaneous Q lunch  . mirtazapine  7.5 mg Oral QHS  . pantoprazole  40 mg Oral Daily   Continuous Infusions:   Time spent: 15 minutes.   LOS: 3 days   RAMA,CHRISTINA  [facility] Pager [phone]. If unable to reach me by pager, please call my cell phone at [phone].  *Please refer to [email or web address], password [email or web address] to get updated schedule on who will round on this patient, as hospitalists switch teams weekly. If 7PM-7AM, please contact night-coverage at [email or web address], password [email or web address] for any overnight needs.  01/02/2015, 7:04 AM

## 2015-01-02 NOTE — [facility] Group Notes (Signed)
[facility] Group Notes:  (Clinical Social Work)  01/02/2015  10:00-11:00AM  Summary of Progress/Problems:   The main focus of today's process group was to   1)  discuss the importance of adding supports  2)  define health supports versus unhealthy supports  3)  identify the patient's current unhealthy supports and plan how to handle them  4)  Identify the patient's current healthy supports and plan what to add.  An emphasis was placed on using counselor, doctor, therapy groups, 12-step groups, and problem-specific support groups to expand supports.    The patient expressed full comprehension of the concepts presented, and agreed that there is a need to add more supports.  The patient stated that his parents are at times healthy supports and at other times are unhealthy for him.  As the group proceeded, he expressed more and more anger at his parents, stating that they enable him to the point that he is nonfunctional (with a place to stay, food, necessities) but they will not then support him with transportation to get back and forth to a job to support himself.   He talked about wanting to become homeless "again" to prove to his parents that he does not need them, and said he has already successfully done that one time.  He was advised by CSW and group to expand his support system to also include professional supports in order to start working on his feelings about parents and make decisions about how to handle their enabling yet judgmental behaviors.  He agreed that this would be a positive move for him to make in his recovery.  Type of Therapy:  Process Group with Motivational Interviewing  Participation Level:  Active  Participation Quality:  Attentive, Monopolizing, Redirectable and Sharing  Affect:  Blunted, Depressed and Labile  Cognitive:  Alert and Appropriate  Insight:  Developing/Improving  Engagement in Therapy:  Engaged  Modes of Intervention:   Education, Support and Processing,  Activity  Pilgrim's PrideMareida Grossman-Orr, LCSW 01/02/2015, 12:15pm

## 2015-01-02 NOTE — Progress Notes (Signed)
[facility] Group Notes:  (Nursing/MHT/Case Management/Adjunct)  Date:  01/01/2015  Time:  2100 Type of Therapy:  wrap up group  Participation Level:  Active  Participation Quality:  Appropriate, Attentive, Sharing and Supportive  Affect:  Appropriate  Cognitive:  Appropriate  Insight:  Good  Engagement in Group:  Engaged  Modes of Intervention:  Clarification, Education and Support  Summary of Progress/Problems: Pt reports feeling very positive today. Pt shared that he knows what he needs to do, he just needs to do it. Stop questioning and procrastinating. Pt reports many stressors in his life, too many to name but can identify. Pt encouraged to find support or sponsor.   Shelah LewandowskySquires, Janea Schwenn Carol 01/02/2015, 4:07 AM

## 2015-01-02 NOTE — Progress Notes (Signed)
[name] is having a better day today as evidenced by his behavior observed while in the milieu..he has laughed and joked with his peers as well as the staff today. He refused his morning doses of protonix and lovenox, but he has taken his scheduled doses of insulin as ordered.   A HE completed his morning  Inventory sheet and on it he wrote he denied SI within the past 24 hrs and he rated his depression, hoeplessness and anxiety " 0/0/4", respectively.    R Safety is in place and while he will still not admit that his overdose was a suicide attempt, he has engaged in conversation with this nurse directed at the need for him to own his feelings and that he needs to learn healthier coping skills. Safety in place.

## 2015-01-02 NOTE — Progress Notes (Signed)
Patient ID: Tyler Mann, male   DOB: 08/05/1981, 34 y.o.   MRN: [id number] [facility] MD Progress Note  01/02/2015 2:08 PM Tyler Mann  MRN:  [id number] Subjective:  "I' got a question about my insulin" O:  34 year old male  known to [facility] from recent psychiatric admission January/2016.   He states that he was doing well but recent stress from family members affected his blood sugars.  He states, having severe cravings for drugs ( has a history of methamphetamine and opiate dependencies) but states he did not use.  Patient seen today and he stated he was ok.  He also was observed to attend in groups and interact well with others.  Discussed with patient that were made bu Hospitalist.  Patient was anxious aboutt he changes.  Explained to patient that lab work and patient's daily glucose results to make decision on insulin regimen.    Principal Problem: MDD (major depressive disorder), recurrent episode, severe Diagnosis:   Patient Active Problem List   Diagnosis Date Noted  . Major depressive disorder, recurrent, severe without psychotic features [F33.2]   . MDD (major depressive disorder), recurrent episode, severe [F33.2] 12/30/2014  . Drug overdose, intentional [T50.902A] 12/26/2014  . Hypoglycemia [E16.2] 12/26/2014  . Overdose of benzodiazepine [T42.4X1A]   . Overdose of insulin [T38.3X1A]   . Substance induced mood disorder [F19.94] 12/08/2014  . Hyperglycemia without ketosis [R73.9]   . Diabetes type 1, uncontrolled [E10.65]   . Suicidal ideation [R45.851] 12/05/2014    Class: Acute  . Hyperglycemia [R73.9] 12/03/2014  . Substance abuse [F19.10] 12/03/2014  . Hypokalemia [E87.6] 11/03/2014  . Leukocytosis [D72.829] 11/02/2014  . Hyperkalemia [E87.5] 11/02/2014  . Chronic back pain [M54.9, G89.29] 11/02/2014  . Hematemesis [K92.0] 11/02/2014  . Generalized anxiety disorder [F41.1] 11/02/2014  . DKA (diabetic ketoacidoses) [E13.10] 11/19/2013  . DKA, type 1 [E10.10] 11/22/2012  .  Opiate dependence [F11.20] 11/22/2012  . Tobacco abuse [Z72.0] 11/22/2012  . Diabetic nephropathy [E11.21] 11/22/2012  . Diabetic ketoacidosis [E13.10] 09/20/2012  . Diabetes mellitus type 1 [E10.9] 09/20/2012  . Chronic pain [G89.29] 09/20/2012  . Hyperlipidemia [E78.5] 09/20/2012   Total Time spent with patient: 30 minutes  Past Medical History:  Past Medical History  Diagnosis Date  . Diabetes mellitus   . Neuropathy   . Narcotic addiction     Hx IV heroin abuse  . Chronic back pain     Past Surgical History  Procedure Laterality Date  . Mouth surgery     Family History: History reviewed. No pertinent family history. Social History:  History  Alcohol Use No     History  Drug Use  . Yes  . Special: Methamphetamines, Heroin    Comment: recovering from heroin-     History   Social History  . Marital Status: Single    Spouse Name: N/A  . Number of Children: N/A  . Years of Education: N/A   Social History Main Topics  . Smoking status: Current Every Day Smoker -- 1.00 packs/day for 17 years    Types: Cigarettes  . Smokeless tobacco: Current User  . Alcohol Use: No  . Drug Use: Yes    Special: Methamphetamines, Heroin     Comment: recovering from heroin-   . Sexual Activity: Yes   Other Topics Concern  . None   Social History Narrative   Additional History:    Sleep: Good  Appetite:  Good  Assessment:   Musculoskeletal: Strength & Muscle Tone: within normal  limits Gait & Station: normal Patient leans: N/A  Psychiatric Specialty Exam: Physical Exam  Vitals reviewed. Psychiatric: His behavior is normal. Thought content normal.    ROS  Blood pressure 151/78, pulse 96, temperature 97.5 F (36.4 C), temperature source Oral, resp. rate 18, height 5' 11.5" (1.816 m), weight 72.462 kg (159 lb 12 oz).Body mass index is 21.97 kg/(m^2).   General Appearance: Fairly Groomed  Eye Contact:: Good  Speech: Normal Rate  Volume: Normal  Mood:  mildly depressed, dysphoric, but denies depression at this time and states his mood is "OK"  Affect: Appropriate and mildly dysphoric  Thought Process: Linear  Orientation: Full (Time, Place, and Person)  Thought Content: denies hallucinations, no delusions, does not appear internally preoccupied   Suicidal Thoughts: No at this time denies any thoughts of hurting self and contracts for safety on unit   Homicidal Thoughts: No  Memory: recent and remote grossly intact   Judgement: Fair  Insight: Fair  Psychomotor Activity: Normal  Concentration: Good  Recall: Good  Fund of Knowledge:Good  Language: Good  Akathisia: Negative  Handed: Right  AIMS (if indicated):    Assets: Desire for Improvement Resilience Social Support  ADL's: Fair   Cognition: fully alert and attentive   Sleep: Number of Hours: 5        Current Medications: Current Facility-Administered Medications  Medication Dose Route Frequency Provider Last Rate Last Dose  . acetaminophen (TYLENOL) tablet 650 mg  650 mg Oral Q6H PRN Takia S Starkes, FNP      . alum & mag hydroxide-simeth (MAALOX/MYLANTA) 200-200-20 MG/5ML suspension 30 mL  30 mL Oral Q4H PRN Takia S Starkes, FNP      . enoxaparin (LOVENOX) injection 40 mg  40 mg Subcutaneous Q24H Takia S Starkes, FNP   40 mg at 12/31/14 0814  . insulin aspart (novoLOG) injection 0-20 Units  0-20 Units Subcutaneous TID WC Christina P Rama, MD   15 Units at 01/02/15 1200  . insulin aspart (novoLOG) injection 0-5 Units  0-5 Units Subcutaneous QHS Christina P Rama, MD      . insulin aspart (novoLOG) injection 12 Units  12 Units Subcutaneous TID WC Christina P Rama, MD   12 Units at 01/02/15 1200  . insulin detemir (LEVEMIR) injection 29 Units  29 Units Subcutaneous Q lunch Takia S Starkes, FNP   29 Units at 01/02/15 1200  . levalbuterol (XOPENEX) nebulizer solution 0.63 mg  0.63 mg Nebulization Q6H PRN Takia S Starkes, FNP      . magnesium  hydroxide (MILK OF MAGNESIA) suspension 30 mL  30 mL Oral Daily PRN Takia S Starkes, FNP      . mirtazapine (REMERON SOL-TAB) disintegrating tablet 7.5 mg  7.5 mg Oral QHS Fernando A Cobos, MD   7.5 mg at 01/01/15 2241  . ondansetron (ZOFRAN) tablet 4 mg  4 mg Oral Q6H PRN Takia S Starkes, FNP       Or  . ondansetron (ZOFRAN) injection 4 mg  4 mg Intravenous Q6H PRN Takia S Starkes, FNP      . pantoprazole (PROTONIX) EC tablet 40 mg  40 mg Oral Daily Irving A Lugo, MD   40 mg at 12/31/14 0814    Lab Results:  Results for orders placed or performed during the hospital encounter of 12/30/14 (from the past 48 hour(s))  Glucose, capillary     Status: Abnormal   Collection Time: 12/31/14  5:05 PM  Result Value Ref Range   Glucose-Capillary 227 (H) 70 -   99 mg/dL  Glucose, capillary     Status: None   Collection Time: 12/31/14  9:02 PM  Result Value Ref Range   Glucose-Capillary 71 70 - 99 mg/dL  Glucose, capillary     Status: Abnormal   Collection Time: 12/31/14 11:28 PM  Result Value Ref Range   Glucose-Capillary 167 (H) 70 - 99 mg/dL  Glucose, capillary     Status: Abnormal   Collection Time: 01/01/15  6:26 AM  Result Value Ref Range   Glucose-Capillary 167 (H) 70 - 99 mg/dL  CBC     Status: Abnormal   Collection Time: 01/01/15  6:30 AM  Result Value Ref Range   WBC 15.0 (H) 4.0 - 10.5 K/uL   RBC 3.94 (L) 4.22 - 5.81 MIL/uL   Hemoglobin 10.8 (L) 13.0 - 17.0 g/dL   HCT 34.9 (L) 39.0 - 52.0 %   MCV 88.6 78.0 - 100.0 fL   MCH 27.4 26.0 - 34.0 pg   MCHC 30.9 30.0 - 36.0 g/dL   RDW 15.2 11.5 - 15.5 %   Platelets 406 (H) 150 - 400 K/uL    Comment: Performed at [facility]  Basic metabolic panel     Status: Abnormal   Collection Time: 01/01/15  6:30 AM  Result Value Ref Range   Sodium 135 135 - 145 mmol/L   Potassium 4.3 3.5 - 5.1 mmol/L   Chloride 99 96 - 112 mmol/L   CO2 29 19 - 32 mmol/L   Glucose, Bld 198 (H) 70 - 99 mg/dL   BUN 28 (H) 6 - 23 mg/dL    Creatinine, Ser 0.79 0.50 - 1.35 mg/dL   Calcium 8.9 8.4 - 10.5 mg/dL   GFR calc non Af Amer >90 >90 mL/min   GFR calc Af Amer >90 >90 mL/min    Comment: (NOTE) The eGFR has been calculated using the CKD EPI equation. This calculation has not been validated in all clinical situations. eGFR's persistently <90 mL/min signify possible Chronic Kidney Disease.    Anion gap 7 5 - 15    Comment: Performed at [facility]  Glucose, capillary     Status: Abnormal   Collection Time: 01/01/15 12:10 PM  Result Value Ref Range   Glucose-Capillary 407 (H) 70 - 99 mg/dL  Glucose, capillary     Status: Abnormal   Collection Time: 01/01/15  1:22 PM  Result Value Ref Range   Glucose-Capillary 355 (H) 70 - 99 mg/dL  Glucose, capillary     Status: Abnormal   Collection Time: 01/01/15  4:56 PM  Result Value Ref Range   Glucose-Capillary 172 (H) 70 - 99 mg/dL   Comment 1 Notify RN   Glucose, capillary     Status: Abnormal   Collection Time: 01/01/15  8:51 PM  Result Value Ref Range   Glucose-Capillary 145 (H) 70 - 99 mg/dL   Comment 1 Notify RN    Comment 2 Document in Chart   Glucose, capillary     Status: Abnormal   Collection Time: 01/02/15  5:53 AM  Result Value Ref Range   Glucose-Capillary 218 (H) 70 - 99 mg/dL   Comment 1 Notify RN    Comment 2 Document in Chart   Creatinine, serum     Status: None   Collection Time: 01/02/15  6:15 AM  Result Value Ref Range   Creatinine, Ser 0.77 0.50 - 1.35 mg/dL   GFR calc non Af Amer >90 >90 mL/min   GFR   calc Af Amer >90 >90 mL/min    Comment: (NOTE) The eGFR has been calculated using the CKD EPI equation. This calculation has not been validated in all clinical situations. eGFR's persistently <90 mL/min signify possible Chronic Kidney Disease. Performed at [facility]   Glucose, capillary     Status: Abnormal   Collection Time: 01/02/15 11:59 AM  Result Value Ref Range   Glucose-Capillary 301 (H) 70 - 99  mg/dL   Comment 1 Notify RN     Physical Findings: AIMS: Facial and Oral Movements Muscles of Facial Expression: None, normal Lips and Perioral Area: None, normal Jaw: None, normal Tongue: None, normal,Extremity Movements Upper (arms, wrists, hands, fingers): None, normal Lower (legs, knees, ankles, toes): None, normal, Trunk Movements Neck, shoulders, hips: None, normal, Overall Severity Severity of abnormal movements (highest score from questions above): None, normal Incapacitation due to abnormal movements: None, normal Patient's awareness of abnormal movements (rate only patient's report): No Awareness, Dental Status Current problems with teeth and/or dentures?: No Does patient usually wear dentures?: No  CIWA:    COWS:     Treatment Plan Summary: Daily contact with patient to assess and evaluate symptoms and progress in treatment and Medication management  Review of chart, vital signs, medications, and notes.  1-Individual and group therapy  2-Medication management for depression and anxiety: Medications reviewed with the patient and she stated no untoward effects  3-Coping skills for depression, anxiety  4-Continue crisis stabilization and management  5-Address health issues--monitoring vital signs, stable  6-Treatment plan in progress to prevent relapse of depression and anxiety   Medical Decision Making:  Established Problem, Stable/Improving (1), Review of Psycho-Social Stressors (1), Discuss test with performing physician (1), Established Problem, Worsening (2), Review of Last Therapy Session (1), Independent Review of image, tracing or specimen (2), Review of Medication Regimen & Side Effects (2) and Review of New Medication or Change in Dosage (2)  AGUSTIN, [place], AGNP-BC 01/02/2015, 2:08 PM   Agree with NP Note as above

## 2015-01-02 NOTE — Progress Notes (Signed)
D Pt. Currently denies SI and HI, no complaints of pain or discomfort noted.  A   Writer offers support and encouragement.  Pt. Denies that he  Injected 300 units of insulin, denies that he was attempting SI.    R Pt. Remains safe on the unit,  Pt. Is very labile, presently blaming others for his [facility].  Pt. Admits he took the xanax then took 30units of insulin, but was a little disoriented and was misunderstood by the paramedics.  Pt. Is interacting with his peers, but is easily angered when talking to girlfriend on the phone.

## 2015-01-03 LAB — GLUCOSE, CAPILLARY
GLUCOSE-CAPILLARY: 273 mg/dL — AB (ref 70–99)
Glucose-Capillary: 233 mg/dL — ABNORMAL HIGH (ref 70–99)
Glucose-Capillary: 321 mg/dL — ABNORMAL HIGH (ref 70–99)
Glucose-Capillary: 199 mg/dL — ABNORMAL HIGH (ref 70–99)
Glucose-Capillary: 115 mg/dL — ABNORMAL HIGH (ref 70–99)

## 2015-01-03 IMAGING — CR DG CHEST 1V PORT
1 series · 1 of 1 positions shown · non-contrast
Comparison: None.

CLINICAL DATA: Hyperglycemia with onset 5 days ago, diffuse pain
and vomiting with cough

EXAM:
PORTABLE CHEST - 1 VIEW

[ap portable]
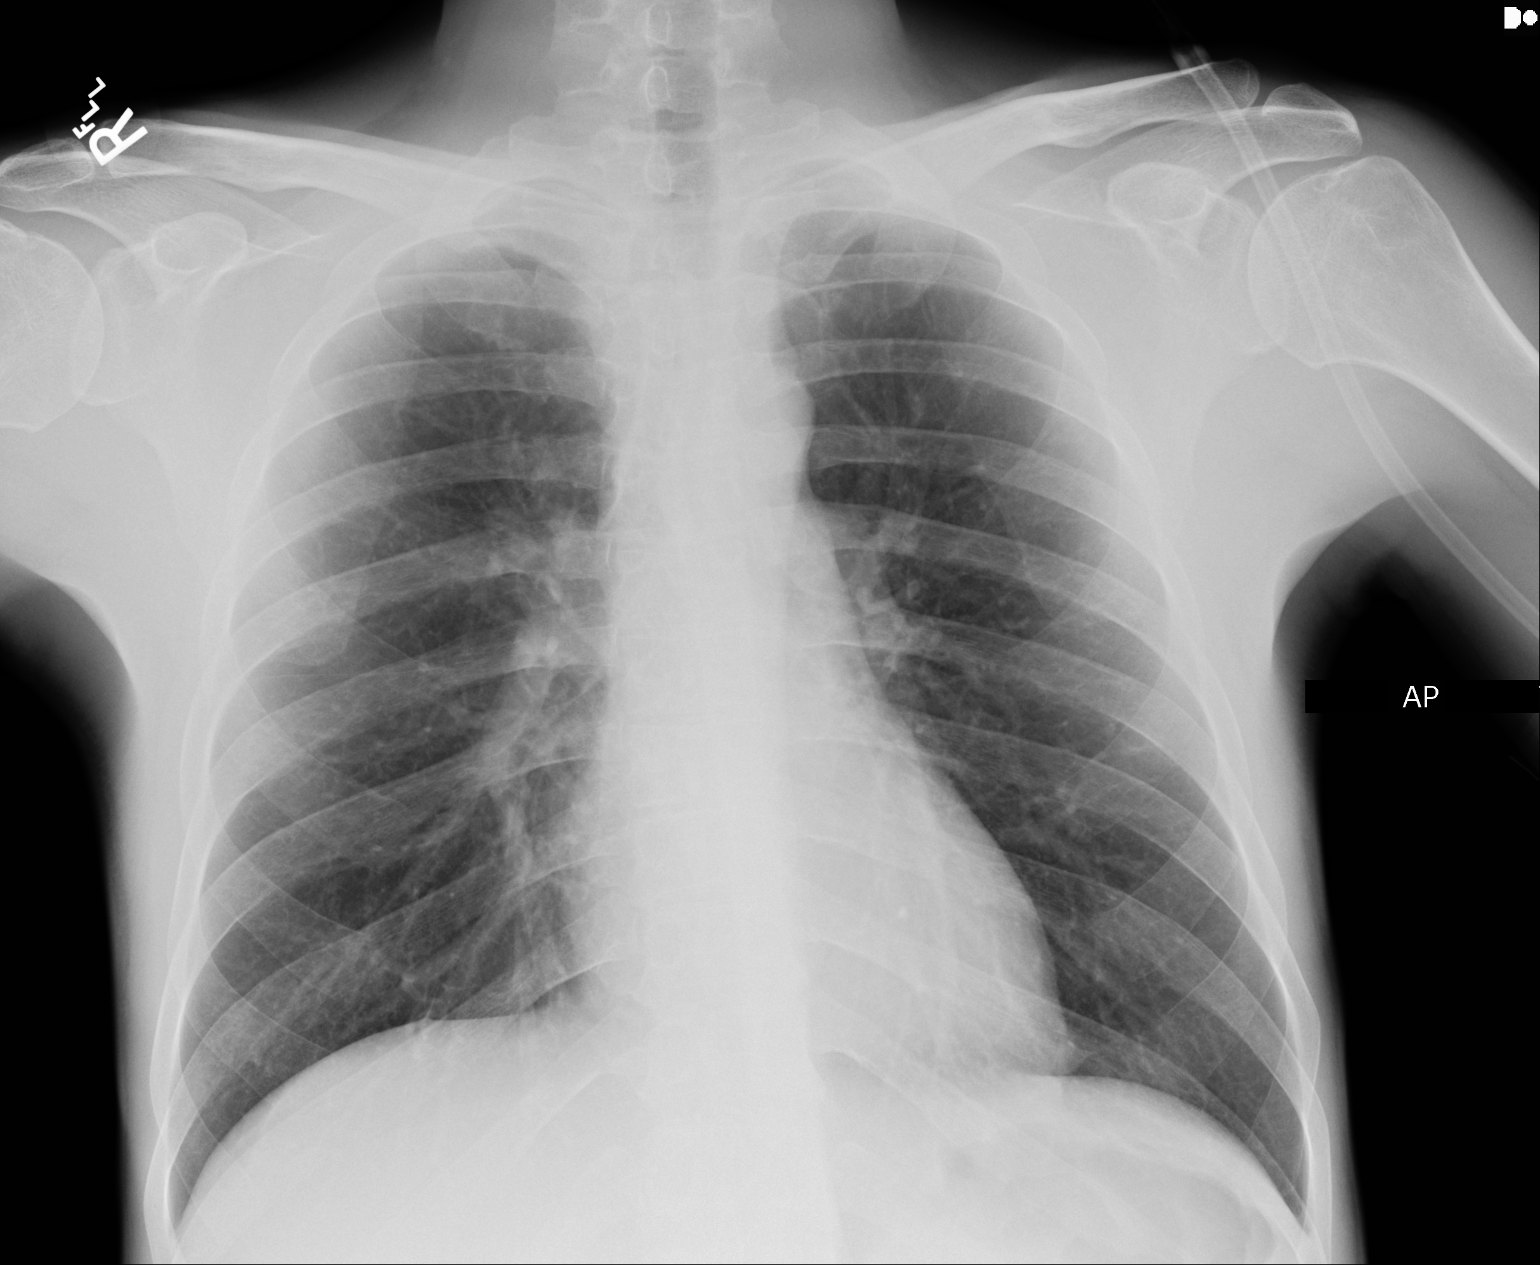

[1 of 1 positions shown; findings below may reference images not displayed]

FINDINGS: The heart size and mediastinal contours are within normal limits.
Both lungs are clear. The visualized skeletal structures are
unremarkable.
IMPRESSION: No active disease.

## 2015-01-03 MED ORDER — MIRTAZAPINE 15 MG PO TBDP
15.0000 mg | ORAL_TABLET | Freq: Every day | ORAL | Status: DC
Start: 2015-01-03 — End: 2015-01-05
  Administered 2015-01-03: 15 mg via ORAL
  Filled 2015-01-03: qty 14
  Filled 2015-01-03 (×2): qty 1

## 2015-01-03 MED ORDER — INSULIN ASPART 100 UNIT/ML ~~LOC~~ SOLN
13.0000 [IU] | Freq: Three times a day (TID) | SUBCUTANEOUS | Status: DC
  Administered 2015-01-03 – 2015-01-04 (×3): 13 [IU] via SUBCUTANEOUS

## 2015-01-03 MED ORDER — NICOTINE 21 MG/24HR TD PT24
21.0000 mg | MEDICATED_PATCH | Freq: Every day | TRANSDERMAL | Status: DC
  Administered 2015-01-03: 21 mg via TRANSDERMAL
  Filled 2015-01-03 (×4): qty 1

## 2015-01-03 NOTE — [facility] Group Notes (Signed)
[facility] LCSW Group Therapy  01/03/2015 12:31 PM  Type of Therapy:  Group Therapy  Participation Level:  Active  Participation Quality:  Attentive  Affect:  Appropriate  Cognitive:  Alert and Oriented  Insight:  Engaged  Engagement in Therapy:  Engaged  Modes of Intervention:  Confrontation, Discussion, Education, Exploration, Problem-solving, Rapport Building, Socialization and Support  Summary of Progress/Problems: Today's Topic: Overcoming Obstacles. Pt identified obstacles faced currently and processed barriers involved in overcoming these obstacles. Pt identified steps necessary for overcoming these obstacles and explored motivation (internal and external) for facing these difficulties head on. Pt further identified one area of concern in their lives and chose a skill of focus pulled from their "toolbox." Tyler RuizJohn was attentive and engaged during today's processing group. He shared that his biggest obstacle is "dealing with not knowing what my future holds." Pt reports he may be going to prison for 25 to life due to some serious drug charges and is unsure about what he is being charged with at this point. "It's hard to care about myself when I'm not sure what lies ahead." Tyler RuizJohn shows progress in the group setting and improving insight AEB his ability to process how taking care of his physical health and staying out of trouble can help him in the short-term to live a fulfilling life while avoiding relapse. He was pleasant and attentive throughout group.   Smart, Tyler Mann LCSWA  01/03/2015, 12:31 PM

## 2015-01-03 NOTE — Progress Notes (Signed)
D Tayler is seen OOB UAL on the 300 hall today..he tolerates this well. HE is observed by this writer playing cards this morning with his peers and he is seen laughing and joking. HE completes his AM inventory as required and on it he writes he denied SI within the past 24 hrs and on it he rated his depression, hopelessness and anxiety " 0/0/0", respectively. He avoids eye contact. HE can be loud, gregarious and jokng one minute and then insolate, subdued and isolative the very next; his affect and behavior can change on a dime.   A He is discussed in treatment team this morning and his impending DC looks like it will happen Tuesday. Pt states he is comfortable with this and that he is " about ready" to go home.   R Again, this nurse educated pt about his suicide risk factors and encouraged pt to " be honest with himself" about his  Low self esteem issues... so that he just won't go home and repeat same unhealthy behaviors over again ( OD with insulin and xanax ), potentially suiciding. Pt remains in denial, adamant that " I DID NOT take 300 units of insulin.Marland Kitchen....where did you hear that??????" Safety in place and this nurse will cont to ecourage pt to examine behavior and work towards transitioning to healthier, more honest coping skillls.

## 2015-01-03 NOTE — [facility] Group Notes (Signed)
Adult Psychoeducational Group Note  Date:  01/03/2015 Time:  12:07 AM  Group Topic/Focus:  Wrap-up Group  Participation Level:  Active  Participation Quality:  Appropriate  Affect:  Appropriate  Cognitive:  Appropriate  Insight: Appropriate  Engagement in Group:  Engaged  Modes of Intervention:  Support  Additional Comments:  Pt states that he had a pretty good day but that it didn't start off good d/t starting a new med and it "lasted longer in my system than I liked".  States that he is supposed to be going home tomorrow but states he will not leave if he does not like the information that will be provided to him by his treatment team.  Tomi Bambergerhestnut, Anhthu Perdew Coursey 01/03/2015, 12:07 AM

## 2015-01-03 NOTE — Progress Notes (Signed)
D Pt. Denies SI and HI, no complaints of pain or discomfort noted at this time.    A Writer offered support and encouragement, discussed discharge plans with pt.  R Pt. Reports that he will be discharging tomorrow.  Pt. Will be returning to live with his parents, his biggest stressor is that he cannot live independently at present time d/t no income.  Pt. Can be laughing and interacting with his peers but is easily agitated.  Pt. Remains safe on the unit.

## 2015-01-03 NOTE — Progress Notes (Signed)
[facility] MD Progress Note  01/03/2015 5:15 PM Tyler Mann  MRN:  [id number] Subjective:  Tyler Mann is anticipating discharge tomorrow. He states he has realized that he and his family need counseling. He states that he needs his mother to give him some space when they are arguing and he cant handle it and  is trying to get away. States he just needs to be left alone for a little while and he will be able to handle it later. But states that when cornered his emotions build up and he goes off. He doesn't think he took that much insulin and that he was probably sedated by the xanax he took when he was texting his mother what he had taken. He does admit that when his sugar is out of control he gets very irritated.  Principal Problem: MDD (major depressive disorder), recurrent episode, severe Diagnosis:   Patient Active Problem List   Diagnosis Date Noted  . Major depressive disorder, recurrent, severe without psychotic features [F33.2]   . MDD (major depressive disorder), recurrent episode, severe [F33.2] 12/30/2014  . Drug overdose, intentional [T50.902A] 12/26/2014  . Hypoglycemia [E16.2] 12/26/2014  . Overdose of benzodiazepine [T42.4X1A]   . Overdose of insulin [T38.3X1A]   . Substance induced mood disorder [F19.94] 12/08/2014  . Hyperglycemia without ketosis [R73.9]   . Diabetes type 1, uncontrolled [E10.65]   . Suicidal ideation [R45.851] 12/05/2014    Class: Acute  . Hyperglycemia [R73.9] 12/03/2014  . Substance abuse [F19.10] 12/03/2014  . Hypokalemia [E87.6] 11/03/2014  . Leukocytosis [D72.829] 11/02/2014  . Hyperkalemia [E87.5] 11/02/2014  . Chronic back pain [M54.9, G89.29] 11/02/2014  . Hematemesis [K92.0] 11/02/2014  . Generalized anxiety disorder [F41.1] 11/02/2014  . DKA (diabetic ketoacidoses) [E13.10] 11/19/2013  . DKA, type 1 [E10.10] 11/22/2012  . Opiate dependence [F11.20] 11/22/2012  . Tobacco abuse [Z72.0] 11/22/2012  . Diabetic nephropathy [E11.21] 11/22/2012  . Diabetic  ketoacidosis [E13.10] 09/20/2012  . Diabetes mellitus type 1 [E10.9] 09/20/2012  . Chronic pain [G89.29] 09/20/2012  . Hyperlipidemia [E78.5] 09/20/2012   Total Time spent with patient: 30 minutes   Past Medical History:  Past Medical History  Diagnosis Date  . Diabetes mellitus   . Neuropathy   . Narcotic addiction     Hx IV heroin abuse  . Chronic back pain     Past Surgical History  Procedure Laterality Date  . Mouth surgery     Family History: History reviewed. No pertinent family history. Social History:  History  Alcohol Use No     History  Drug Use  . Yes  . Special: Methamphetamines, Heroin    Comment: recovering from heroin-     History   Social History  . Marital Status: Single    Spouse Name: N/A  . Number of Children: N/A  . Years of Education: N/A   Social History Main Topics  . Smoking status: Current Every Day Smoker -- 1.00 packs/day for 17 years    Types: Cigarettes  . Smokeless tobacco: Current User  . Alcohol Use: No  . Drug Use: Yes    Special: Methamphetamines, Heroin     Comment: recovering from heroin-   . Sexual Activity: Yes   Other Topics Concern  . None   Social History Narrative   Additional History:    Sleep: Poor  Appetite:  Fair   Assessment:   Musculoskeletal: Strength & Muscle Tone: within normal limits Gait & Station: normal Patient leans: N/A   Psychiatric Specialty Exam: Physical Exam  Review of Systems  Constitutional: Negative.   HENT: Negative.   Eyes: Negative.   Respiratory: Negative.   Cardiovascular: Negative.   Gastrointestinal: Negative.   Genitourinary: Negative.   Musculoskeletal: Negative.   Skin: Negative.   Neurological: Negative.   Endo/Heme/Allergies: Negative.   Psychiatric/Behavioral: Positive for depression. The patient is nervous/anxious and has insomnia.     Blood pressure 118/82, pulse 94, temperature 97.9 F (36.6 C), temperature source Oral, resp. rate 20, height 5' 11.5"  (1.816 m), weight 72.462 kg (159 lb 12 oz).Body mass index is 21.97 kg/(m^2).  General Appearance: Fairly Groomed  Engineer, water::  Fair  Speech:  Clear and Coherent  Volume:  fluctuates  Mood:  Anxious  Affect:  anxious worried  Thought Process:  Coherent and Goal Directed  Orientation:  Full (Time, Place, and Person)  Thought Content:  symptoms events worries concerns plans as he moves on  Suicidal Thoughts:  No  Homicidal Thoughts:  No  Memory:  Immediate;   Fair Recent;   Fair Remote;   Fair  Judgement:  Fair  Insight:  Fair  Psychomotor Activity:  Restlessness  Concentration:  Fair  Recall:  [employer] of Knowledge:Fair  Language: Fair  Akathisia:  No  Handed:  Right  AIMS (if indicated):     Assets:  Desire for Improvement Housing  ADL's:  Intact  Cognition: WNL  Sleep:  Number of Hours: 3     Current Medications: Current Facility-Administered Medications  Medication Dose Route Frequency Provider Last Rate Last Dose  . acetaminophen (TYLENOL) tablet 650 mg  650 mg Oral Q6H PRN Nanci Pina, FNP      . alum & mag hydroxide-simeth (MAALOX/MYLANTA) 200-200-20 MG/5ML suspension 30 mL  30 mL Oral Q4H PRN Nanci Pina, FNP      . enoxaparin (LOVENOX) injection 40 mg  40 mg Subcutaneous Q24H Nanci Pina, FNP   40 mg at 12/31/14 0814  . insulin aspart (novoLOG) injection 0-20 Units  0-20 Units Subcutaneous TID WC Venetia Maxon Rama, MD   15 Units at 01/03/15 1203  . insulin aspart (novoLOG) injection 0-5 Units  0-5 Units Subcutaneous QHS Venetia Maxon Rama, MD   0 Units at 01/02/15 2226  . insulin aspart (novoLOG) injection 13 Units  13 Units Subcutaneous TID WC Venetia Maxon Rama, MD   13 Units at 01/03/15 1203  . insulin detemir (LEVEMIR) injection 29 Units  29 Units Subcutaneous Q lunch Nanci Pina, FNP   29 Units at 01/03/15 1202  . levalbuterol (XOPENEX) nebulizer solution 0.63 mg  0.63 mg Nebulization Q6H PRN Nanci Pina, FNP      . magnesium hydroxide (MILK  OF MAGNESIA) suspension 30 mL  30 mL Oral Daily PRN Nanci Pina, FNP      . mirtazapine (REMERON SOL-TAB) disintegrating tablet 15 mg  15 mg Oral QHS Nicholaus Bloom, MD      . ondansetron [facility]) tablet 4 mg  4 mg Oral Q6H PRN Nanci Pina, FNP       Or  . ondansetron (ZOFRAN) injection 4 mg  4 mg Intravenous Q6H PRN Nanci Pina, FNP      . pantoprazole (PROTONIX) EC tablet 40 mg  40 mg Oral Daily Nicholaus Bloom, MD   40 mg at 12/31/14 4373    Lab Results:  Results for orders placed or performed during the hospital encounter of 12/30/14 (from the past 48 hour(s))  Glucose, capillary  Status: Abnormal   Collection Time: 01/01/15  8:51 PM  Result Value Ref Range   Glucose-Capillary 145 (H) 70 - 99 mg/dL   Comment 1 Notify RN    Comment 2 Document in Chart   Glucose, capillary     Status: Abnormal   Collection Time: 01/02/15  5:53 AM  Result Value Ref Range   Glucose-Capillary 218 (H) 70 - 99 mg/dL   Comment 1 Notify RN    Comment 2 Document in Chart   Creatinine, serum     Status: None   Collection Time: 01/02/15  6:15 AM  Result Value Ref Range   Creatinine, Ser 0.77 0.50 - 1.35 mg/dL   GFR calc non Af Amer >90 >90 mL/min   GFR calc Af Amer >90 >90 mL/min    Comment: (NOTE) The eGFR has been calculated using the CKD EPI equation. This calculation has not been validated in all clinical situations. eGFR's persistently <90 mL/min signify possible Chronic Kidney Disease. Performed at [facility]   Glucose, capillary     Status: Abnormal   Collection Time: 01/02/15 11:59 AM  Result Value Ref Range   Glucose-Capillary 301 (H) 70 - 99 mg/dL   Comment 1 Notify RN   Glucose, capillary     Status: Abnormal   Collection Time: 01/02/15  4:50 PM  Result Value Ref Range   Glucose-Capillary 143 (H) 70 - 99 mg/dL   Comment 1 Notify RN   Glucose, capillary     Status: None   Collection Time: 01/02/15  8:43 PM  Result Value Ref Range   Glucose-Capillary 78  70 - 99 mg/dL  Glucose, capillary     Status: Abnormal   Collection Time: 01/03/15  6:35 AM  Result Value Ref Range   Glucose-Capillary 233 (H) 70 - 99 mg/dL  Glucose, capillary     Status: Abnormal   Collection Time: 01/03/15 11:25 AM  Result Value Ref Range   Glucose-Capillary 321 (H) 70 - 99 mg/dL   Comment 1 Document in Chart   Glucose, capillary     Status: Abnormal   Collection Time: 01/03/15  4:48 PM  Result Value Ref Range   Glucose-Capillary 199 (H) 70 - 99 mg/dL   Comment 1 Document in Chart     Physical Findings: AIMS: Facial and Oral Movements Muscles of Facial Expression: None, normal Lips and Perioral Area: None, normal Jaw: None, normal Tongue: None, normal,Extremity Movements Upper (arms, wrists, hands, fingers): None, normal Lower (legs, knees, ankles, toes): None, normal, Trunk Movements Neck, shoulders, hips: None, normal, Overall Severity Severity of abnormal movements (highest score from questions above): None, normal Incapacitation due to abnormal movements: None, normal Patient's awareness of abnormal movements (rate only patient's report): No Awareness, Dental Status Current problems with teeth and/or dentures?: No Does patient usually wear dentures?: No  CIWA:    COWS:     Treatment Plan Summary: Daily contact with patient to assess and evaluate symptoms and progress in treatment and Medication management Supportive approach/coping skills Major depression; will continue to work with CBT/mindfulness. He wants to try the therapy first and then be considered for medications Insomnia: will go ahead and increase the Remeron to 15 mg HS and reassess Continue to monitor blood sugar: states that at home he has more options in terms of the way he administers his insulin for what he keeps it under better control  Medical Decision Making:  Review of Psycho-Social Stressors (1), Review or order clinical lab tests (1),  Review of Medication Regimen & Side Effects  (2) and Review of New Medication or Change in Dosage (2)     Sota Hetz A 01/03/2015, 5:15 PM

## 2015-01-03 NOTE — Progress Notes (Signed)
Consult Note:   Tyler Mann YNW:[id number] DOB: August 13, 1981 DOA: 12/30/2014 PCP: Catalina Pizza, MD  Requesting physician: Dr. Dub Mikes  Reason for consultation: Type 1 DM, uncontrolled blood glucoses    Brief Narrative:   Tyler Mann is an 34 y.o. male with a PMH of type 1 diabetes, depression and heroin abuse who was admitted to [facility] on 12/31/14 after being treated at [facility] for an overdose on approximately 20 Xanax tablets and 300 units of insulin. He required mechanical ventilation for airway protection and hypoxic respiratory failure.   Assessment/Plan:   Principal Problem:   MDD (major depressive disorder), recurrent episode, severe Active Problems:   Diabetes mellitus type 1   Major depressive disorder, recurrent, severe without psychotic features   CBGs 78-301 over past 24 hours. Continue SSI, resistant scale and increase meal coverage to 13 units Q AC.  Hemoglobin A1c 10.4% on 12/09/2014 indicating poor overall glycemic control.  Suspect he is not compliant with his medications at home.  Subjective:   Tyler Mann remains stable, still not following recommended diet.  Objective:    Filed Vitals:   01/01/15 6578 01/01/15 0626 01/02/15 0635 01/02/15 0636  BP: 142/79 121/72 129/85 151/78  Pulse: 94 103 91 96  Temp: 98.2 F (36.8 C)  97.5 F (36.4 C)   TempSrc: Oral  Oral   Resp: 18  18   Height:      Weight:       No intake or output data in the 24 hours ending 01/03/15 4696  Exam: Patient not examined today.   Data Reviewed:    Labs: Basic Metabolic Panel:  Recent Labs Lab 12/30/14 1100 01/01/15 0630 01/02/15 0615  NA 132* 135  --   K 5.0 4.3  --   CL 95* 99  --   CO2 25 29  --   GLUCOSE 491* 198*  --   BUN 23 28*  --   CREATININE 0.84 0.79 0.77  CALCIUM 9.0 8.9  --    GFR Estimated Creatinine Clearance: 132.2 mL/min (by C-G formula based on Cr of 0.77). Liver Function Tests: No results for input(s): AST, ALT, ALKPHOS, BILITOT,  PROT, ALBUMIN in the last 168 hours. Coagulation profile No results for input(s): INR, PROTIME in the last 168 hours.  CBC:  Recent Labs Lab 01/01/15 0630  WBC 15.0*  HGB 10.8*  HCT 34.9*  MCV 88.6  PLT 406*   CBG:  Recent Labs Lab 01/02/15 0553 01/02/15 1159 01/02/15 1650 01/02/15 2043 01/03/15 0635  GLUCAP 218* 301* 143* 78 233*   Lipid Profile: No results for input(s): CHOL, HDL, LDLCALC, TRIG, CHOLHDL, LDLDIRECT in the last 72 hours. Microbiology Recent Results (from the past 240 hour(s))  MRSA PCR Screening     Status: None   Collection Time: 12/26/14  3:30 AM  Result Value Ref Range Status   MRSA by PCR NEGATIVE NEGATIVE Final    Comment:        The GeneXpert MRSA Assay (FDA approved for NASAL specimens only), is one component of a comprehensive MRSA colonization surveillance program. It is not intended to diagnose MRSA infection nor to guide or monitor treatment for MRSA infections.      Medications:   . enoxaparin (LOVENOX) injection  40 mg Subcutaneous Q24H  . insulin aspart  0-20 Units Subcutaneous TID WC  . insulin aspart  0-5 Units Subcutaneous QHS  . insulin aspart  12 Units Subcutaneous TID WC  . insulin detemir  29 Units Subcutaneous Q lunch  . mirtazapine  7.5 mg Oral QHS  . pantoprazole  40 mg Oral Daily   Continuous Infusions:   Time spent: 15 minutes.   LOS: 4 days   RAMA,CHRISTINA  [facility] Pager [phone]. If unable to reach me by pager, please call my cell phone at [phone].  *Please refer to [email or web address], password [email or web address] to get updated schedule on who will round on this patient, as hospitalists switch teams weekly. If 7PM-7AM, please contact night-coverage at [email or web address], password [email or web address] for any overnight needs.  01/03/2015, 6:59 AM

## 2015-01-03 NOTE — [facility] Group Notes (Signed)
[facility] Note   01/03/2015 9:32 AM  Participation Quality:  Appropriate   Mood/Affect:  Appropriate  Depression Rating:  2-3  Anxiety Rating:  2-3  Thoughts of Suicide:  No Will you contract for safety?   NA  Current AVH:  No  Plan for Discharge/Comments:  Pt reports that he is "okay with staying until Tomorrow." Pt asking for referral for med management and therapy. CSW assessing for appropriate referrals. Pt reporting poor sleep currently. He can return home at d/c.   Transportation Means: parents   Supports: parents   Smart, OncologistHeather LCSWA

## 2015-01-03 NOTE — Progress Notes (Signed)
D Pt. Denies SI and HI, no complaints of pain or discomfort noted.  A Writer offered support and encouragement, discussed discharge plans with pt.  R Pt. Remains safe on the unit,  Pt. Continues to put blame on his Mother and denies he OD on the insulin, but admits he has anger issues and admits he OD on the xanax.  Pt. Is stressed that he does not have a job or the income to live independently.  He will be returning to his parents home and appears to be accepting this at present time.  No anger outbursts, depression or anxiety noted today.

## 2015-01-03 NOTE — Clinical Social Work Note (Signed)
Pt now requesting referral for med management and therapy in [place] area. [employer] insurance. [facility]. CSW continuing to assess for appropriate referrals.   The Sherwin-WilliamsHeather Smart, LCSWA 01/03/2015 3:50 PM

## 2015-01-04 DIAGNOSIS — T50901A Poisoning by unspecified drugs, medicaments and biological substances, accidental (unintentional), initial encounter: Secondary | ICD-10-CM | POA: Insufficient documentation

## 2015-01-04 DIAGNOSIS — F11229 Opioid dependence with intoxication, unspecified: Secondary | ICD-10-CM | POA: Insufficient documentation

## 2015-01-04 LAB — GLUCOSE, CAPILLARY
Glucose-Capillary: 208 mg/dL — ABNORMAL HIGH (ref 70–99)
Glucose-Capillary: 286 mg/dL — ABNORMAL HIGH (ref 70–99)

## 2015-01-04 MED ORDER — ENOXAPARIN SODIUM 40 MG/0.4ML ~~LOC~~ SOLN: 40.0000 mg | SUBCUTANEOUS | Status: DC

## 2015-01-04 MED ORDER — MIRTAZAPINE 15 MG PO TBDP: 15.0000 mg | ORAL_TABLET | Freq: Every day | ORAL | Status: DC

## 2015-01-04 MED ORDER — INSULIN ASPART 100 UNIT/ML ~~LOC~~ SOLN
14.0000 [IU] | Freq: Three times a day (TID) | SUBCUTANEOUS | Status: DC
  Administered 2015-01-04: 14 [IU] via SUBCUTANEOUS

## 2015-01-04 MED ORDER — INSULIN DETEMIR 100 UNIT/ML ~~LOC~~ SOLN: 29.0000 [IU] | Freq: Every day | SUBCUTANEOUS | Status: DC

## 2015-01-04 MED ORDER — INSULIN ASPART 100 UNIT/ML ~~LOC~~ SOLN: 14.0000 [IU] | Freq: Three times a day (TID) | SUBCUTANEOUS | Status: DC

## 2015-01-04 NOTE — [facility] Suicide Risk Assessment (Signed)
[facility] INPATIENT:  Family/Significant Other Suicide Prevention Education  Suicide Prevention Education:  Education Completed; Tyler Mann and Tyler Mann (pt's parents) [phone] have been identified by the patient as the family member/significant other with whom the patient will be residing, and identified as the person(s) who will aid the patient in the event of a mental health crisis (suicidal ideations/suicide attempt).  With written consent from the patient, the family member/significant other has been provided the following suicide prevention education, prior to the and/or following the discharge of the patient.  The suicide prevention education provided includes the following:  Suicide risk factors  Suicide prevention and interventions  National Suicide Hotline telephone number  [facility] assessment telephone number  [facility] Emergency Assistance 911  [facility] and/or Residential Mobile Crisis Unit telephone number  Request made of family/significant other to:  Remove weapons (e.g., guns, rifles, knives), all items previously/currently identified as safety concern.    Remove drugs/medications (over-the-counter, prescriptions, illicit drugs), all items previously/currently identified as a safety concern.  The family member/significant other verbalizes understanding of the suicide prevention education information provided.  The family member/significant other agrees to remove the items of safety concern listed above.  Smart, Eon Zunker LCSWA  01/04/2015, 9:56 AM

## 2015-01-04 NOTE — Discharge Summary (Signed)
Physician Discharge Summary Note  Patient:  Tyler Mann is an 34 y.o., male MRN:  [id number] DOB:  12-23-1980 Patient phone:  [phone] (home)  Patient address:   [place] [address],  Total Time spent with patient: Greater than 30 minutes  Date of Admission:  12/30/2014 Date of Discharge: 01/04/14  Reason for Admission: Suicide attempt by overdose  Principal Problem: MDD (major depressive disorder), recurrent episode, severe Discharge Diagnoses: Patient Active Problem List   Diagnosis Date Noted  . Opioid dependence with intoxication with complication [L93.570]   . Medication overdose [T50.901A]   . Major depressive disorder, recurrent, severe without psychotic features [F33.2]   . MDD (major depressive disorder), recurrent episode, severe [F33.2] 12/30/2014  . Drug overdose, intentional [T50.902A] 12/26/2014  . Hypoglycemia [E16.2] 12/26/2014  . Overdose of benzodiazepine [T42.4X1A]   . Overdose of insulin [T38.3X1A]   . Substance induced mood disorder [F19.94] 12/08/2014  . Hyperglycemia without ketosis [R73.9]   . Diabetes type 1, uncontrolled [E10.65]   . Suicidal ideation [R45.851] 12/05/2014    Class: Acute  . Hyperglycemia [R73.9] 12/03/2014  . Substance abuse [F19.10] 12/03/2014  . Hypokalemia [E87.6] 11/03/2014  . Leukocytosis [D72.829] 11/02/2014  . Hyperkalemia [E87.5] 11/02/2014  . Chronic back pain [M54.9, G89.29] 11/02/2014  . Hematemesis [K92.0] 11/02/2014  . Generalized anxiety disorder [F41.1] 11/02/2014  . DKA (diabetic ketoacidoses) [E13.10] 11/19/2013  . DKA, type 1 [E10.10] 11/22/2012  . Opiate dependence [F11.20] 11/22/2012  . Tobacco abuse [Z72.0] 11/22/2012  . Diabetic nephropathy [E11.21] 11/22/2012  . Diabetic ketoacidosis [E13.10] 09/20/2012  . Diabetes mellitus type 1 [E10.9] 09/20/2012  . Chronic pain [G89.29] 09/20/2012  . Hyperlipidemia [E78.5] 09/20/2012   Musculoskeletal: Strength & Muscle Tone: within normal  limits Gait & Station: normal Patient leans: N/A  Psychiatric Specialty Exam: Physical Exam  Psychiatric: His speech is normal and behavior is normal. Judgment and thought content normal. His mood appears not anxious. His affect is not angry, not blunt, not labile and not inappropriate. Cognition and memory are normal. He does not exhibit a depressed mood.    Review of Systems  Constitutional: Negative.   HENT: Negative.   Eyes: Negative.   Respiratory: Negative.   Cardiovascular: Negative.   Gastrointestinal: Negative.   Genitourinary: Negative.   Musculoskeletal: Negative.   Skin: Negative.   Neurological: Negative.   Endo/Heme/Allergies: Negative.   Psychiatric/Behavioral: Positive for depression (Stable) and substance abuse (Hx Opioid dependence). Negative for suicidal ideas and hallucinations. The patient has insomnia (Stable). The patient is not nervous/anxious.     Blood pressure 126/80, pulse 96, temperature 97.7 F (36.5 C), temperature source Oral, resp. rate 18, height 5' 11.5" (1.816 m), weight 72.462 kg (159 lb 12 oz).Body mass index is 21.97 kg/(m^2).  See medication lists   Past Medical History:  Past Medical History  Diagnosis Date  . Diabetes mellitus   . Neuropathy   . Narcotic addiction     Hx IV heroin abuse  . Chronic back pain     Past Surgical History  Procedure Laterality Date  . Mouth surgery     Family History: History reviewed. No pertinent family history. Social History:  History  Alcohol Use No     History  Drug Use  . Yes  . Special: Methamphetamines, Heroin    Comment: recovering from heroin-     History   Social History  . Marital Status: Single    Spouse Name: N/A  . Number of Children: N/A  .  Years of Education: N/A   Social History Main Topics  . Smoking status: Current Every Day Smoker -- 1.00 packs/day for 17 years    Types: Cigarettes  . Smokeless tobacco: Current User  . Alcohol Use: No  . Drug Use: Yes    Special:  Methamphetamines, Heroin     Comment: recovering from heroin-   . Sexual Activity: Yes   Other Topics Concern  . None   Social History Narrative   Risk to Self: Is patient at risk for suicide?: Yes Risk to Others: No Prior Inpatient Therapy: No Prior Outpatient Therapy: No  Level of Care:  OP  Hospital Course:  34 year old male, known to [place] from recent psychiatric admission To our unit in January/2016. He states " I was doing OK when I left". He states he was doing " all right" until a few days ago. States " on that day all sort of things went wrong- my mother was tripping , my girlfriend was giving me a hard time, and my sugar was all messed up". He states He started having severe cravings for drugs ( has a history of methamphetamine and opiate dependencies) but states he did not use. States " that's when I guess I said " f... It and just overdosed". Overdosed on Xanax . States " I just wanted all that stuff on my head to stop- I really don't think I wanted to die".  While a patient in this hospital, Jary received medication management for mood stabilization. He was medicated & discharged on Mirtazapine disintegrating tablet 15 mg Q bedtime for depression/sleep. He also was enrolled and participated in the Group counseling sessions being offered and held on this unit. Jasmond learned coping skills that should help him cope better and maintain mood stability after discharge. He also presented with other pre-existing medical issues that required treatments. He was resumed on all his pertinent home medications for all his pre-existing medical issues. He tolerated his treatment regimen without any adverse effects reported.  Barrett was noted to be motivated for recovery throughout his hospital stay. He worked closely with the treatment team and case manager to develop a discharge plan with appropriate goals to maintain mood stability. Coping skills, problem solving as well as relaxation therapies were  also part of the unit programming. On the day of discharge he was in much improved condition than upon admission. His symptoms were reported as significantly decreased or resolved completely. Upon discharge, he denies any SI/HI, AVH, delusional thoughts and or paranoia. He was motivated to continue taking medication with a goal of continued improvement in mental health.    He will continue medical/psychiatric treatments, medication management and counseling services on an outpatient basis. He is provided with all the pertinent information required to make these appointments without problems. He left [facility] with all personal belongings in no distress. Transportation per parents.  Consults:  psychiatry  Significant Diagnostic Studies:  labs: CBC with diff, CMP, UDS, toxicology tests, U/A, results reviewed, no changes  Discharge Vitals:   Blood pressure 126/80, pulse 96, temperature 97.7 F (36.5 C), temperature source Oral, resp. rate 18, height 5' 11.5" (1.816 m), weight 72.462 kg (159 lb 12 oz). Body mass index is 21.97 kg/(m^2). Lab Results:   Results for orders placed or performed during the hospital encounter of 12/30/14 (from the past 72 hour(s))  Glucose, capillary     Status: Abnormal   Collection Time: 01/01/15 12:10 PM  Result Value Ref Range   Glucose-Capillary  407 (H) 70 - 99 mg/dL  Glucose, capillary     Status: Abnormal   Collection Time: 01/01/15  1:22 PM  Result Value Ref Range   Glucose-Capillary 355 (H) 70 - 99 mg/dL  Glucose, capillary     Status: Abnormal   Collection Time: 01/01/15  4:56 PM  Result Value Ref Range   Glucose-Capillary 172 (H) 70 - 99 mg/dL   Comment 1 Notify RN   Glucose, capillary     Status: Abnormal   Collection Time: 01/01/15  8:51 PM  Result Value Ref Range   Glucose-Capillary 145 (H) 70 - 99 mg/dL   Comment 1 Notify RN    Comment 2 Document in Chart   Glucose, capillary     Status: Abnormal   Collection Time: 01/02/15  5:53 AM  Result Value Ref  Range   Glucose-Capillary 218 (H) 70 - 99 mg/dL   Comment 1 Notify RN    Comment 2 Document in Chart   Creatinine, serum     Status: None   Collection Time: 01/02/15  6:15 AM  Result Value Ref Range   Creatinine, Ser 0.77 0.50 - 1.35 mg/dL   GFR calc non Af Amer >90 >90 mL/min   GFR calc Af Amer >90 >90 mL/min    Comment: (NOTE) The eGFR has been calculated using the CKD EPI equation. This calculation has not been validated in all clinical situations. eGFR's persistently <90 mL/min signify possible Chronic Kidney Disease. Performed at [facility]   Glucose, capillary     Status: Abnormal   Collection Time: 01/02/15 11:59 AM  Result Value Ref Range   Glucose-Capillary 301 (H) 70 - 99 mg/dL   Comment 1 Notify RN   Glucose, capillary     Status: Abnormal   Collection Time: 01/02/15  4:50 PM  Result Value Ref Range   Glucose-Capillary 143 (H) 70 - 99 mg/dL   Comment 1 Notify RN   Glucose, capillary     Status: None   Collection Time: 01/02/15  8:43 PM  Result Value Ref Range   Glucose-Capillary 78 70 - 99 mg/dL  Glucose, capillary     Status: Abnormal   Collection Time: 01/03/15  6:35 AM  Result Value Ref Range   Glucose-Capillary 233 (H) 70 - 99 mg/dL  Glucose, capillary     Status: Abnormal   Collection Time: 01/03/15 11:25 AM  Result Value Ref Range   Glucose-Capillary 321 (H) 70 - 99 mg/dL   Comment 1 Document in Chart   Glucose, capillary     Status: Abnormal   Collection Time: 01/03/15  4:48 PM  Result Value Ref Range   Glucose-Capillary 199 (H) 70 - 99 mg/dL   Comment 1 Document in Chart   Glucose, capillary     Status: Abnormal   Collection Time: 01/03/15  8:42 PM  Result Value Ref Range   Glucose-Capillary 115 (H) 70 - 99 mg/dL  Glucose, capillary     Status: Abnormal   Collection Time: 01/04/15  6:24 AM  Result Value Ref Range   Glucose-Capillary 208 (H) 70 - 99 mg/dL    Physical Findings: AIMS: Facial and Oral Movements Muscles of  Facial Expression: None, normal Lips and Perioral Area: None, normal Jaw: None, normal Tongue: None, normal,Extremity Movements Upper (arms, wrists, hands, fingers): None, normal Lower (legs, knees, ankles, toes): None, normal, Trunk Movements Neck, shoulders, hips: None, normal, Overall Severity Severity of abnormal movements (highest score from questions above): None, normal Incapacitation due  to abnormal movements: None, normal Patient's awareness of abnormal movements (rate only patient's report): No Awareness, Dental Status Current problems with teeth and/or dentures?: No Does patient usually wear dentures?: No  CIWA:    COWS:      See Psychiatric Specialty Exam and Suicide Risk Assessment completed by Attending Physician prior to discharge.  Discharge destination:  Home  Is patient on multiple antipsychotic therapies at discharge:  No   Has Patient had three or more failed trials of antipsychotic monotherapy by history:  No  Recommended Plan for Multiple Antipsychotic Therapies: NA    Medication List    STOP taking these medications        ALPRAZolam 0.5 MG tablet  Commonly known as:  XANAX     cyclobenzaprine 10 MG tablet  Commonly known as:  FLEXERIL     hydrOXYzine 10 MG tablet  Commonly known as:  ATARAX/VISTARIL     meloxicam 15 MG tablet  Commonly known as:  MOBIC     Milnacipran 50 MG Tabs tablet  Commonly known as:  SAVELLA      TAKE these medications      Indication   enoxaparin 40 MG/0.4ML injection  Commonly known as:  LOVENOX  Inject 0.4 mLs (40 mg total) into the skin daily. For prevention of blood clot   Indication:  Blood Clot in a Deep Vein     insulin aspart 100 UNIT/ML injection  Commonly known as:  novoLOG  Inject 14 Units into the skin 3 (three) times daily with meals. For diabetes managment   Indication:  Type 2 Diabetes     insulin detemir 100 UNIT/ML injection  Commonly known as:  LEVEMIR  Inject 0.29 mLs (29 Units total) into  the skin at bedtime. For diabetes management   Indication:  Insulin-Dependent Diabetes, Diabetes     mirtazapine 15 MG disintegrating tablet  Commonly known as:  REMERON SOL-TAB  Take 1 tablet (15 mg total) by mouth at bedtime. For depression/sleep   Indication:  Trouble Sleeping, Major Depressive Disorder       Follow-up Information    Follow up with Delphina Cahill, MD. Schedule an appointment as soon as possible for a visit in 1 week.   Specialty:  Internal Medicine   Why:  To follow up on uncontrolled diabetes.   Contact information:    [place] [address] [phone]       Follow up with [facility] On 03/11/2015.   Why:  Appt at 10:30AM for medication management with Dr. Harrington Challenger. Please call intermittently prior to this appt to check for cancellations-they may be able to see you sooner if they have opening(s).    Contact information:   [address] [place], [place] [address] Phone: [phone] Fax: [phone]      Follow up with [facility]  On 02/21/2015.   Why:  Appt for therapy at 10:45AM on this date with Dr. Sima Matas. Please bring insurance card and new patient packet to this appt.    Contact information:   [address] [place], [place] [address] Phone: [phone] Fax: [phone]      Follow up with [facility] .   Why:  Message left to referral dept. Office will call you within 24 hours of discharge to inform you of acceptance for medication management and therapy services. If you decide to go to this agency for services, please cancel appts at [facility]    Contact information:   [address] [place], [place] [address] Phone: [phone] Fax: [phone]     Follow-up recommendations: Activity:  As tolerated Diet: As recommended by your primary care doctor. Keep all scheduled follow-up appointments as recommended.    Comments:  Take all your medications as prescribed by your mental healthcare provider. Report any adverse effects and or reactions from your medicines to your outpatient provider promptly. Patient is instructed and cautioned to not engage in alcohol and or illegal drug use while on prescription medicines. In the event of worsening symptoms, patient is instructed to call the crisis hotline, 911 and or go to the nearest ED for appropriate evaluation and treatment of symptoms. Follow-up with your primary care provider for your other medical issues, concerns and or health care needs.    Total Discharge Time: Greater than 30 minutes  Signed: Encarnacion Slates, PMHNP, FNP-BC 01/04/2015, 10:58 AM   Patient seen, Suicide Assessment Completed.  Disposition Plan Reviewed

## 2015-01-04 NOTE — Progress Notes (Signed)
Consult Note:   Tyler Mann ION:[id number] DOB: June 23, 1981 DOA: 12/30/2014 PCP: Catalina Pizza, MD  Requesting physician: Dr. Dub Mikes  Reason for consultation: Type 1 DM, uncontrolled blood glucoses    Brief Narrative:   Tyler Mann is an 34 y.o. male with a PMH of type 1 diabetes, depression and heroin abuse who was admitted to [facility] on 12/31/14 after being treated at [facility] for an overdose on approximately 20 Xanax tablets and 300 units of insulin. He required mechanical ventilation for airway protection and hypoxic respiratory failure.   Assessment/Plan:   Principal Problem:   MDD (major depressive disorder), recurrent episode, severe Active Problems:   Diabetes mellitus type 1   Major depressive disorder, recurrent, severe without psychotic features   CBGs 115-321 over past 24 hours. Continue SSI, resistant scale and increase meal coverage to 14 units Q AC. Discharge home on this regimen.  Hemoglobin A1c 10.4% on 12/09/2014 indicating poor overall glycemic control.  Suspect he is not compliant with his medications at home.  Recommend outpatient diabetes education and close follow-up with PCP. It is VERY IMPORTANT that he follow up with a PCP on a regular basis.  He should check his blood glucoses before each meal and at bedtime and maintain a log of his readings.  He should bring this log with him when he follows up with his PCP so that he or she can adjust his insulin at his follow up visit.  Subjective:   Tyler Mann remains stable,  for possible discharge today.  Objective:    Filed Vitals:   01/02/15 0635 01/02/15 0636 01/03/15 0700 01/03/15 0703  BP: 129/85 151/78 131/80 118/82  Pulse: 91 96 88 94  Temp: 97.5 F (36.4 C)  97.9 F (36.6 C)   TempSrc: Oral     Resp: 18  20   Height:      Weight:       No intake or output data in the 24 hours ending 01/04/15 0747  Exam: Patient not examined today.   Data Reviewed:    Labs: Basic Metabolic  Panel:  Recent Labs Lab 12/30/14 1100 01/01/15 0630 01/02/15 0615  NA 132* 135  --   K 5.0 4.3  --   CL 95* 99  --   CO2 25 29  --   GLUCOSE 491* 198*  --   BUN 23 28*  --   CREATININE 0.84 0.79 0.77  CALCIUM 9.0 8.9  --    GFR Estimated Creatinine Clearance: 132.2 mL/min (by C-G formula based on Cr of 0.77). Liver Function Tests: No results for input(s): AST, ALT, ALKPHOS, BILITOT, PROT, ALBUMIN in the last 168 hours. Coagulation profile No results for input(s): INR, PROTIME in the last 168 hours.  CBC:  Recent Labs Lab 01/01/15 0630  WBC 15.0*  HGB 10.8*  HCT 34.9*  MCV 88.6  PLT 406*   CBG:  Recent Labs Lab 01/03/15 0635 01/03/15 1125 01/03/15 1648 01/03/15 2042 01/04/15 0624  GLUCAP 233* 321* 199* 115* 208*   Lipid Profile: No results for input(s): CHOL, HDL, LDLCALC, TRIG, CHOLHDL, LDLDIRECT in the last 72 hours. Microbiology Recent Results (from the past 240 hour(s))  MRSA PCR Screening     Status: None   Collection Time: 12/26/14  3:30 AM  Result Value Ref Range Status   MRSA by PCR NEGATIVE NEGATIVE Final    Comment:        The GeneXpert MRSA Assay (FDA approved for NASAL specimens  only), is one component of a comprehensive MRSA colonization surveillance program. It is not intended to diagnose MRSA infection nor to guide or monitor treatment for MRSA infections.      Medications:   . enoxaparin (LOVENOX) injection  40 mg Subcutaneous Q24H  . insulin aspart  0-20 Units Subcutaneous TID WC  . insulin aspart  0-5 Units Subcutaneous QHS  . insulin aspart  13 Units Subcutaneous TID WC  . insulin detemir  29 Units Subcutaneous Q lunch  . mirtazapine  15 mg Oral QHS  . nicotine  21 mg Transdermal Daily  . pantoprazole  40 mg Oral Daily   Continuous Infusions:   Time spent: 15 minutes.   LOS: 5 days   Lavaya Defreitas  [facility] Pager [phone]. If unable to reach me by pager, please call my cell phone at [phone].  *Please  refer to [email or web address], password [email or web address] to get updated schedule on who will round on this patient, as hospitalists switch teams weekly. If 7PM-7AM, please contact night-coverage at [email or web address], password [email or web address] for any overnight needs.  01/04/2015, 7:47 AM

## 2015-01-04 NOTE — Progress Notes (Signed)
Inpatient Diabetes Program Recommendations  AACE/ADA: New Consensus Statement on Inpatient Glycemic Control (2013)  Target Ranges:  Prepandial:   less than 140 mg/dL      Peak postprandial:   less than 180 mg/dL (1-2 hours)      Critically ill patients:  140 - 180 mg/dL   Ordered [facility] Diabetes Education [facility] at [facility].  Thank you. Ailene Ardshonda Ramces Shomaker, RD, LDN, CDE Inpatient Diabetes Coordinator [phone]

## 2015-01-04 NOTE — Progress Notes (Signed)
  [facility] :  Will you be returning to the same living situation after discharge:  Yes,  home with parents At discharge, do you have transportation home?: Yes,  parents coming late afternoon to pick him up. Do you have the ability to pay for your medications: Yes,  BCBS  Release of information consent forms completed and submitted to Medical Records by CSW.  Patient to Follow up at: Follow-up Information    Follow up with Catalina PizzaHALL, ZACH, MD. Schedule an appointment as soon as possible for a visit in 1 week.   Specialty:  Internal Medicine   Why:  To follow up on uncontrolled diabetes.   Contact information:    [address]  [place] [address] [phone]       [facility] On 03/11/2015.   Why:  Appt at 10:30AM for medication management with Dr. Tenny Crawoss. Please call intermittently prior to this appt to check for cancellations-they may be able to see you sooner if they have opening(s).    Contact information:   [address] [place], [place] [address] Phone: [phone] Fax: [phone]      Follow up with [facility]  On 02/21/2015.   Why:  Appt for therapy at 10:45AM on this date with Dr. Kieth Brightlyodenbough. Please bring insurance card and new patient packet to this appt.    Contact information:   [address] [place], [place] [address] Phone: [phone] Fax: [phone]      Follow up with [facility] .   Why:  Message left to referral dept. Office will call you within 24 hours of discharge to inform you of acceptance for medication management and therapy services. If you decide to go to this agency for services, please cancel appts at [facility].    Contact information:   [address] [place], [place] [address] Phone: [phone] Fax: [phone]      Patient denies SI/HI: Yes,  during group/self report.     Safety  Planning and Suicide Prevention discussed: Yes,  SPE completed via phone conference with both parents. SPI pamphlet provided to pt and he was encouraged to share information with support network, ask questions, and talk about any concerns relating to SPE.  Have you used any form of tobacco in the last 30 days? (Cigarettes, Smokeless Tobacco, Cigars, and/or Pipes): Yes  Has patient been referred to the Quitline?: Patient refused referral   Smart, Lebron QuamHeather LCSWA  01/04/2015, 10:40 AM

## 2015-01-04 NOTE — [facility] Suicide Risk Assessment (Signed)
[facility]   Demographic Factors:  34 year old single male, lives with parents.   Total Time spent with patient: 30 minutes  Musculoskeletal: Strength & Muscle Tone: within normal limits Gait & Station: normal Patient leans: N/A  Psychiatric Specialty Exam: Physical Exam  ROS  Blood pressure 126/80, pulse 96, temperature 97.7 F (36.5 C), temperature source Oral, resp. rate 18, height 5' 11.5" (1.816 m), weight 159 lb 12 oz (72.462 kg).Body mass index is 21.97 kg/(m^2).  General Appearance: Fairly Groomed  Patent attorneyye Contact::  Good  Speech:  Normal Rate  Volume:  Normal  Mood:  improved mood and affect. Denies depression  Affect:  Appropriate  Thought Process:  Goal Directed and Linear  Orientation:  Full (Time, Place, and Person)  Thought Content:  no hallucinations, no delusions  Suicidal Thoughts:  No  Homicidal Thoughts:  No- specifically, also denies any violent or homicidal ideations towards mother or any other family member  Memory:  recent and remote grossly intact   Judgement:  Other:  improved  Insight:  Fair  Psychomotor Activity:  Normal  Concentration:  Good  Recall:  Good  Fund of Knowledge:Good  Language: Good  Akathisia:  Negative  Handed:  Right  AIMS (if indicated):     Assets:  Communication Skills Desire for Improvement Resilience  Sleep:  Number of Hours: 6.5  Cognition: WNL  ADL's:  Improved    Have you used any form of tobacco in the last 30 days? (Cigarettes, Smokeless Tobacco, Cigars, and/or Pipes): Yes  Has this patient used any form of tobacco in the last 30 days? (Cigarettes, Smokeless Tobacco, Cigars, and/or Pipes)  I encouraged patient to work on smoking cessation- he is ambivalent but did express interest in being prescribed nicotine patch for smoking cessation- will speak with his PCP if her decides to continue these .  Mental Status Per Nursing Assessment::   On Admission:  Self-harm behaviors (patient states, "I  was never suicidal." see DAR)  Current Mental Status by Physician: At this time Jonny RuizJohn is improved compared to admission, his mood is better, his affect is appropriate, no thought disorder, no SI or HI, no psychotic symptoms. Future oriented .  Loss Factors: Unemployment, no source of income , history of family tension. Legal issues.   Historical Factors: History of prior psychiatric admissions, history of  Opiate and methamphetamine dependencies . History of DM type I .  Risk Reduction Factors:   Sense of responsibility to family and Positive coping skills or problem solving skills  Continued Clinical Symptoms:  At this time improved compared to admission. Mood and affect improved, behavior calm and in control. No withdrawal symptoms. Vitals stable.  Cognitive Features That Contribute To Risk:  Closed-mindedness    Suicide Risk:  Mild:  Suicidal ideation of limited frequency, intensity, duration, and specificity.  There are no identifiable plans, no associated intent, mild dysphoria and related symptoms, good self-control (both objective and subjective assessment), few other risk factors, and identifiable protective factors, including available and accessible social support.  Principal Problem: MDD (major depressive disorder), recurrent episode, severe/ opiate /methamphetamine dependencies  Discharge Diagnoses:  Patient Active Problem List   Diagnosis Date Noted  . Major depressive disorder, recurrent, severe without psychotic features [F33.2]   . MDD (major depressive disorder), recurrent episode, severe [F33.2] 12/30/2014  . Drug overdose, intentional [T50.902A] 12/26/2014  . Hypoglycemia [E16.2] 12/26/2014  . Overdose of benzodiazepine [T42.4X1A]   . Overdose of insulin [T38.3X1A]   .  Substance induced mood disorder [F19.94] 12/08/2014  . Hyperglycemia without ketosis [R73.9]   . Diabetes type 1, uncontrolled [E10.65]   . Suicidal ideation [R45.851] 12/05/2014    Class: Acute   . Hyperglycemia [R73.9] 12/03/2014  . Substance abuse [F19.10] 12/03/2014  . Hypokalemia [E87.6] 11/03/2014  . Leukocytosis [D72.829] 11/02/2014  . Hyperkalemia [E87.5] 11/02/2014  . Chronic back pain [M54.9, G89.29] 11/02/2014  . Hematemesis [K92.0] 11/02/2014  . Generalized anxiety disorder [F41.1] 11/02/2014  . DKA (diabetic ketoacidoses) [E13.10] 11/19/2013  . DKA, type 1 [E10.10] 11/22/2012  . Opiate dependence [F11.20] 11/22/2012  . Tobacco abuse [Z72.0] 11/22/2012  . Diabetic nephropathy [E11.21] 11/22/2012  . Diabetic ketoacidosis [E13.10] 09/20/2012  . Diabetes mellitus type 1 [E10.9] 09/20/2012  . Chronic pain [G89.29] 09/20/2012  . Hyperlipidemia [E78.5] 09/20/2012    Follow-up Information    Follow up with Catalina Pizza, MD. Schedule an appointment as soon as possible for a visit in 1 week.   Specialty:  Internal Medicine   Why:  To follow up on uncontrolled diabetes.   Contact information:    [address]  [place] [address] [phone]       Follow up with [facility] On 03/11/2015.   Why:  Appt at 10:30AM for medication management with Dr. Tenny Craw. Please call intermittently prior to this appt to check for cancellations-they may be able to see you sooner if they have opening(s).    Contact information:   [address] [place], [place] [address] Phone: [phone] Fax: [phone]      Follow up with [facility]  On 02/21/2015.   Why:  Appt for therapy at 10:45AM on this date with Dr. Kieth Brightly. Please bring insurance card and new patient packet to this appt.    Contact information:   [address] [place], [place] [address] Phone: [phone] Fax: [phone]      Follow up with [facility] .   Why:  Message left to referral dept. Office will call you within 24 hours of discharge to inform you of acceptance for medication management and therapy services. If  you decide to go to this agency for services, please cancel appts at [facility].    Contact information:   [address] [place], [place] [address] Phone: [phone] Fax: [phone]      Plan Of Care/Follow-up recommendations:  Activity:  As tolerated Diet:  Daibetic Diet Tests:  NA Other:  See below  Is patient on multiple antipsychotic therapies at discharge:  No   Has Patient had three or more failed trials of antipsychotic monotherapy by history:  No  Recommended Plan for Multiple Antipsychotic Therapies: NA  Patient is discharging in good spirits. Plans to return home- lives with parents. Patient plans to follow up with his PCP, Dr. Margo Aye for ongoing treatment of medical issues/DM.   COBOS, FERNANDO 01/04/2015, 10:04 AM

## 2015-01-04 NOTE — [facility] Group Notes (Signed)
[facility] Group Notes:  (Nursing/MHT/Case Management/Adjunct)  Date:  01/04/2015  Time:  0900am  Type of Therapy:  Nurse Education  Participation Level:  Did Not Attend  Participation Quality:  Did not attend  Affect:  Did not attend  Cognitive:  Did not attend  Insight:  None  Engagement in Group:  Did not attend  Modes of Intervention:  Discussion, Education and Support  Summary of Progress/Problems: Patient was invited to group but did not attend and remained in bed resting.  Lendell CapriceGuthrie, Woodrow Dulski A 01/04/2015, 11:25 AM

## 2015-01-04 NOTE — [facility] Group Notes (Signed)
[facility] LCSW Group Therapy  01/04/2015 1:21 PM  Type of Therapy:  Group Therapy  Participation Level:  Active  Participation Quality:  Attentive  Affect:  Appropriate  Cognitive:  Alert and Oriented  Insight:  Improving  Engagement in Therapy:  Improving  Modes of Intervention:  Discussion, Education, Exploration, Problem-solving, Rapport Building, Socialization and Support  Summary of Progress/Problems: [facility] Speaker came to talk about his personal journey with substance abuse and addiction. The pt processed ways by which to relate to the speaker. MHA speaker provided handouts and educational information pertaining to groups and services offered by [facility].   Smart, Yancey Pedley LCSWA 01/04/2015, 1:21 PM

## 2015-01-04 NOTE — Progress Notes (Signed)
Discharge Note: Discharge instructions/prescriptions/medication samples given to patient. Patient verbalized understanding of discharge instructions and prescriptions. Returned belongings to patient. Denies SI/HI/AVH. Patient d/c without incident to the lobby and transported home with relative.

## 2015-01-04 NOTE — Discharge Instructions (Signed)
It is VERY IMPORTANT that you follow up with a PCP on a regular basis.  Check your blood glucoses before each meal and at bedtime and maintain a log of your readings.  Bring this log with you when you follow up with your PCP so that he or she can adjust your insulin at your follow up visit.

## 2015-01-06 NOTE — Progress Notes (Signed)
Patient Discharge Instructions:  After Visit Summary (AVS):   Faxed to:  01/06/15 Discharge Summary Note:   Faxed to:  01/06/15 Psychiatric Admission Assessment Note:   Faxed to:  01/06/15 Suicide Risk Assessment - Discharge Assessment:   Faxed to:  01/06/15 Faxed/Sent to the Next Level Care provider:  01/06/15 Next Level Care Provider Has Access to the EMR, 01/06/15 Faxed to Catalina PizzaZach Hall, MD @ [phone] Faxed to [facility] @ [phone] Records [facility] via [facility]/Epic access.  Jerelene ReddenSheena E [place], 01/06/2015, 3:35 PM

## 2015-02-21 ENCOUNTER — Ambulatory Visit (HOSPITAL_COMMUNITY): Payer: Self-pay | Admitting: Psychology

## 2015-02-26 IMAGING — CR DG CHEST 1V PORT
1 series · 1 of 1 positions shown · non-contrast
Comparison: 12/04/2014

CLINICAL DATA: Altered mental status. Possible overdose.
Endotracheal tube placement.

EXAM:
PORTABLE CHEST - 1 VIEW

[portable]
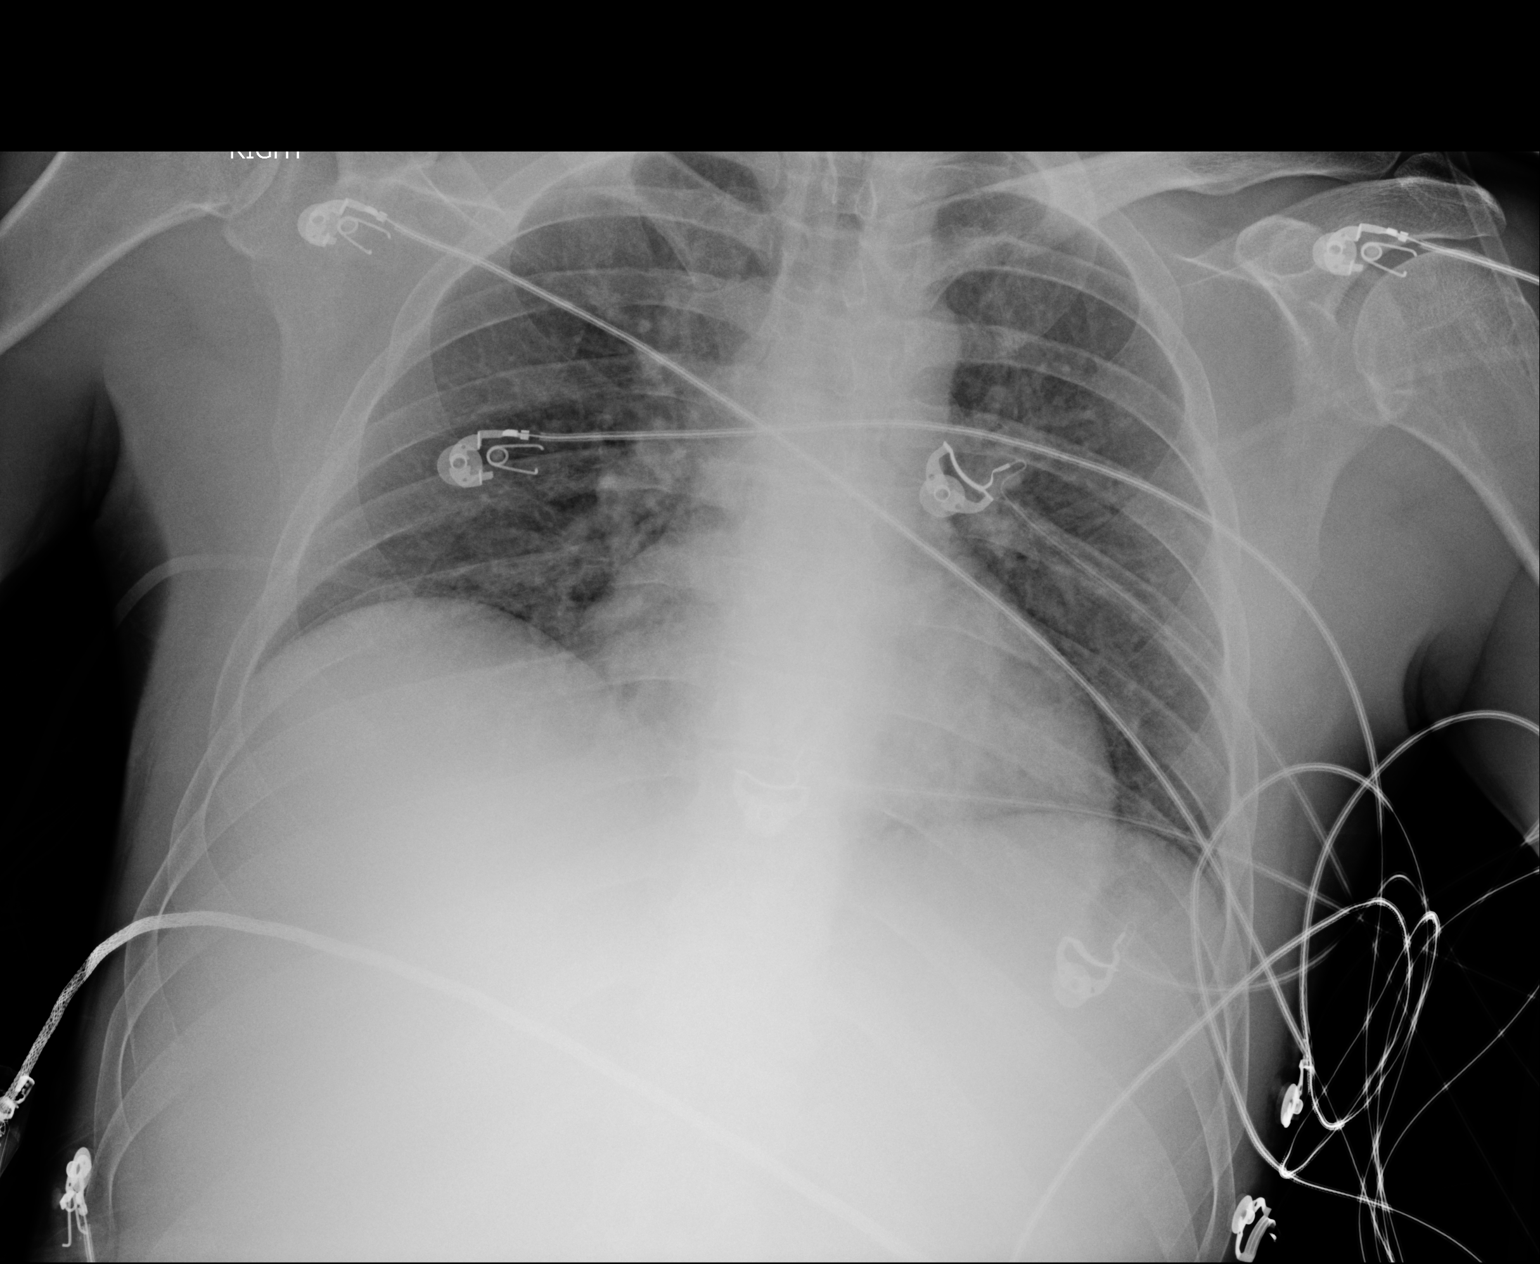

[1 of 1 positions shown; findings below may reference images not displayed]

FINDINGS: The endotracheal tube is high, 7.5 cm from the carina, advancement
of 2 cm recommended. Lung volumes are low. Cardiomediastinal
contours are normal. Mild bibasilar atelectasis. No confluent
airspace disease to suggest pneumonia. Pulmonary vasculature is
normal. There is no pleural effusion or pneumothorax. No acute
osseous abnormality.
IMPRESSION: 1. High positioning of endotracheal tube 7.5 cm from the carina,
advancement of 2 cm recommended.
2. Low lung volumes with bibasilar atelectasis.

## 2015-03-11 ENCOUNTER — Ambulatory Visit (HOSPITAL_COMMUNITY): Payer: Self-pay | Admitting: Psychiatry

## 2015-05-16 ENCOUNTER — Other Ambulatory Visit: Payer: Self-pay

## 2016-11-29 DIAGNOSIS — E1065 Type 1 diabetes mellitus with hyperglycemia: Secondary | ICD-10-CM | POA: Diagnosis not present

## 2016-11-29 DIAGNOSIS — M79643 Pain in unspecified hand: Secondary | ICD-10-CM | POA: Diagnosis not present

## 2016-11-29 DIAGNOSIS — D649 Anemia, unspecified: Secondary | ICD-10-CM | POA: Diagnosis not present

## 2016-11-29 DIAGNOSIS — Z8659 Personal history of other mental and behavioral disorders: Secondary | ICD-10-CM | POA: Diagnosis not present

## 2016-12-19 DIAGNOSIS — F5221 Male erectile disorder: Secondary | ICD-10-CM | POA: Diagnosis not present

## 2016-12-19 DIAGNOSIS — M79643 Pain in unspecified hand: Secondary | ICD-10-CM | POA: Diagnosis not present

## 2016-12-19 DIAGNOSIS — D649 Anemia, unspecified: Secondary | ICD-10-CM | POA: Diagnosis not present

## 2016-12-19 DIAGNOSIS — E1065 Type 1 diabetes mellitus with hyperglycemia: Secondary | ICD-10-CM | POA: Diagnosis not present

## 2016-12-19 DIAGNOSIS — E782 Mixed hyperlipidemia: Secondary | ICD-10-CM | POA: Diagnosis not present

## 2017-02-20 DIAGNOSIS — E1065 Type 1 diabetes mellitus with hyperglycemia: Secondary | ICD-10-CM | POA: Diagnosis not present

## 2017-02-25 DIAGNOSIS — E1065 Type 1 diabetes mellitus with hyperglycemia: Secondary | ICD-10-CM | POA: Diagnosis not present

## 2017-02-25 DIAGNOSIS — F5221 Male erectile disorder: Secondary | ICD-10-CM | POA: Diagnosis not present

## 2017-02-25 DIAGNOSIS — G589 Mononeuropathy, unspecified: Secondary | ICD-10-CM | POA: Diagnosis not present

## 2017-02-25 DIAGNOSIS — M79643 Pain in unspecified hand: Secondary | ICD-10-CM | POA: Diagnosis not present

## 2017-03-06 DIAGNOSIS — I10 Essential (primary) hypertension: Secondary | ICD-10-CM | POA: Diagnosis not present

## 2017-03-06 DIAGNOSIS — E1065 Type 1 diabetes mellitus with hyperglycemia: Secondary | ICD-10-CM | POA: Diagnosis not present

## 2017-03-27 DIAGNOSIS — G9009 Other idiopathic peripheral autonomic neuropathy: Secondary | ICD-10-CM | POA: Diagnosis not present

## 2017-03-27 DIAGNOSIS — Z9641 Presence of insulin pump (external) (internal): Secondary | ICD-10-CM | POA: Diagnosis not present

## 2017-03-27 DIAGNOSIS — J309 Allergic rhinitis, unspecified: Secondary | ICD-10-CM | POA: Diagnosis not present

## 2017-03-27 DIAGNOSIS — E291 Testicular hypofunction: Secondary | ICD-10-CM | POA: Diagnosis not present

## 2017-03-27 DIAGNOSIS — E1065 Type 1 diabetes mellitus with hyperglycemia: Secondary | ICD-10-CM | POA: Diagnosis not present

## 2017-03-27 DIAGNOSIS — E119 Type 2 diabetes mellitus without complications: Secondary | ICD-10-CM | POA: Diagnosis not present

## 2017-04-12 DIAGNOSIS — E108 Type 1 diabetes mellitus with unspecified complications: Secondary | ICD-10-CM | POA: Diagnosis not present

## 2017-04-12 DIAGNOSIS — E109 Type 1 diabetes mellitus without complications: Secondary | ICD-10-CM | POA: Diagnosis not present

## 2017-04-12 DIAGNOSIS — E1065 Type 1 diabetes mellitus with hyperglycemia: Secondary | ICD-10-CM | POA: Diagnosis not present

## 2017-04-12 DIAGNOSIS — Z794 Long term (current) use of insulin: Secondary | ICD-10-CM | POA: Diagnosis not present

## 2017-05-15 DIAGNOSIS — E108 Type 1 diabetes mellitus with unspecified complications: Secondary | ICD-10-CM | POA: Diagnosis not present

## 2017-05-15 DIAGNOSIS — E1065 Type 1 diabetes mellitus with hyperglycemia: Secondary | ICD-10-CM | POA: Diagnosis not present

## 2017-05-15 DIAGNOSIS — E109 Type 1 diabetes mellitus without complications: Secondary | ICD-10-CM | POA: Diagnosis not present

## 2017-05-15 DIAGNOSIS — Z794 Long term (current) use of insulin: Secondary | ICD-10-CM | POA: Diagnosis not present

## 2017-05-16 DIAGNOSIS — E1065 Type 1 diabetes mellitus with hyperglycemia: Secondary | ICD-10-CM | POA: Diagnosis not present

## 2017-07-02 ENCOUNTER — Encounter (HOSPITAL_COMMUNITY): Payer: Self-pay | Admitting: *Deleted

## 2017-07-02 ENCOUNTER — Emergency Department (HOSPITAL_COMMUNITY)
Admission: EM | Admit: 2017-07-02 | Discharge: 2017-07-02 | Disposition: A | Discharge status: Home / Self Care | Payer: BLUE CROSS/BLUE SHIELD | Attending: Emergency Medicine | Admitting: Emergency Medicine

## 2017-07-02 DIAGNOSIS — E109 Type 1 diabetes mellitus without complications: Secondary | ICD-10-CM | POA: Diagnosis not present

## 2017-07-02 DIAGNOSIS — F17210 Nicotine dependence, cigarettes, uncomplicated: Secondary | ICD-10-CM | POA: Insufficient documentation

## 2017-07-02 DIAGNOSIS — S61012A Laceration without foreign body of left thumb without damage to nail, initial encounter: Secondary | ICD-10-CM

## 2017-07-02 DIAGNOSIS — S6992XA Unspecified injury of left wrist, hand and finger(s), initial encounter: Secondary | ICD-10-CM | POA: Diagnosis not present

## 2017-07-02 DIAGNOSIS — Z23 Encounter for immunization: Secondary | ICD-10-CM | POA: Diagnosis not present

## 2017-07-02 DIAGNOSIS — Z794 Long term (current) use of insulin: Secondary | ICD-10-CM | POA: Diagnosis not present

## 2017-07-02 DIAGNOSIS — W260XXA Contact with knife, initial encounter: Secondary | ICD-10-CM | POA: Insufficient documentation

## 2017-07-02 DIAGNOSIS — Y929 Unspecified place or not applicable: Secondary | ICD-10-CM | POA: Diagnosis not present

## 2017-07-02 DIAGNOSIS — Y9389 Activity, other specified: Secondary | ICD-10-CM | POA: Insufficient documentation

## 2017-07-02 DIAGNOSIS — Y999 Unspecified external cause status: Secondary | ICD-10-CM | POA: Diagnosis not present

## 2017-07-02 DIAGNOSIS — Z79899 Other long term (current) drug therapy: Secondary | ICD-10-CM | POA: Diagnosis not present

## 2017-07-02 DIAGNOSIS — Z7901 Long term (current) use of anticoagulants: Secondary | ICD-10-CM | POA: Insufficient documentation

## 2017-07-02 HISTORY — DX: Bipolar disorder, unspecified: F31.9

## 2017-07-02 HISTORY — DX: Other specified behavioral and emotional disorders with onset usually occurring in childhood and adolescence: F98.8

## 2017-07-02 MED ORDER — POVIDONE-IODINE 10 % EX SOLN
CUTANEOUS | Status: AC
  Filled 2017-07-02: qty 15

## 2017-07-02 MED ORDER — TETANUS-DIPHTH-ACELL PERTUSSIS 5-2.5-18.5 LF-MCG/0.5 IM SUSP
0.5000 mL | Freq: Once | INTRAMUSCULAR | Status: AC
  Administered 2017-07-02: 0.5 mL via INTRAMUSCULAR
  Filled 2017-07-02: qty 0.5

## 2017-07-02 MED ORDER — LIDOCAINE HCL (PF) 1 % IJ SOLN
INTRAMUSCULAR | Status: AC
  Filled 2017-07-02: qty 5

## 2017-07-02 NOTE — ED Notes (Signed)
Pt ambulatory to waiting room. Pt verbalized understanding of discharge instructions.   

## 2017-07-02 NOTE — Discharge Instructions (Signed)
Please keep the sutured wound to your hand clean and dry. Please have the sutures removed in 7 days. Please return sooner if any unusual red streaking, or signs of advancing infection.

## 2017-07-02 NOTE — ED Provider Notes (Signed)
[facility]-EM[facility]Y DEPT Provider Note   CSN: [id number] Arrival date & time: 07/02/17  1824     History   Chief Complaint Chief Complaint  Patient presents with  . Laceration    HPI Tyler Mann is a 36 y.o. male.   Patient is a 36 year old male who presents to the emergency department with a complaint of laceration to the left thumb. Pt states he was cutting with his pocket knife and injured  The left thumb. He controlled the bleeding with applied pressure. He has some pain, but no problem using the thumb. No other cuts. Pt is unsure of the date of the last tetanus shot.      Past Medical History:  Diagnosis Date  . ADD (attention deficit disorder)   . Bipolar disorder (HCC)   . Chronic back pain   . Diabetes mellitus    type 1  . Narcotic addiction (HCC)    Hx IV heroin abuse  . Neuropathy     Patient Active Problem List   Diagnosis Date Noted  . Opioid dependence with intoxication with complication (HCC)   . Medication overdose   . Major depressive disorder, recurrent, severe without psychotic features (HCC)   . MDD (major depressive disorder), recurrent episode, severe (HCC) 12/30/2014  . Drug overdose, intentional (HCC) 12/26/2014  . Hypoglycemia 12/26/2014  . Overdose of benzodiazepine   . Overdose of insulin   . Substance induced mood disorder (HCC) 12/08/2014  . Hyperglycemia without ketosis   . Diabetes type 1, uncontrolled (HCC)   . Suicidal ideation 12/05/2014    Class: Acute  . Hyperglycemia 12/03/2014  . Substance abuse 12/03/2014  . Hypokalemia 11/03/2014  . Leukocytosis 11/02/2014  . Hyperkalemia 11/02/2014  . Chronic back pain 11/02/2014  . Hematemesis 11/02/2014  . Generalized anxiety disorder 11/02/2014  . DKA (diabetic ketoacidoses) (HCC) 11/19/2013  . DKA, type 1 (HCC) 11/22/2012  . Opiate dependence (HCC) 11/22/2012  . Tobacco abuse 11/22/2012  . Diabetic nephropathy (HCC) 11/22/2012  . Diabetic ketoacidosis (HCC) 09/20/2012  .  Diabetes mellitus type 1 (HCC) 09/20/2012  . Chronic pain 09/20/2012  . Hyperlipidemia 09/20/2012    Past Surgical History:  Procedure Laterality Date  . INCISION AND DRAINAGE    . MOUTH SURGERY         Home Medications    Prior to Admission medications   Medication Sig Start Date End Date Taking? Authorizing Provider  enoxaparin (LOVENOX) 40 MG/0.4ML injection Inject 0.4 mLs (40 mg total) into the skin daily. For prevention of blood clot 01/04/15   Nwoko, Nicole Kindred I, NP  insulin aspart (NOVOLOG) 100 UNIT/ML injection Inject 14 Units into the skin 3 (three) times daily with meals. For diabetes managment 01/04/15   Armandina Stammer I, NP  insulin detemir (LEVEMIR) 100 UNIT/ML injection Inject 0.29 mLs (29 Units total) into the skin at bedtime. For diabetes management 01/04/15   Armandina Stammer I, NP  mirtazapine (REMERON SOL-TAB) 15 MG disintegrating tablet Take 1 tablet (15 mg total) by mouth at bedtime. For depression/sleep 01/04/15   Armandina Stammer I, NP    Family History No family history on file.  Social History Social History  Substance Use Topics  . Smoking status: Current Every Day Smoker    Packs/day: 1.00    Years: 17.00    Types: Cigarettes  . Smokeless tobacco: Current User  . Alcohol use Yes     Comment: occasionally      Allergies   Trazodone and nefazodone   Review of  Systems Review of Systems  Constitutional: Negative for activity change.       All ROS Neg except as noted in HPI  HENT: Negative for nosebleeds.   Eyes: Negative for photophobia and discharge.  Respiratory: Negative for cough, shortness of breath and wheezing.   Cardiovascular: Negative for chest pain and palpitations.  Gastrointestinal: Negative for abdominal pain and blood in stool.  Genitourinary: Negative for dysuria, frequency and hematuria.  Musculoskeletal: Negative for arthralgias, back pain and neck pain.  Skin: Negative.   Neurological: Negative for dizziness, seizures and speech  difficulty.  Psychiatric/Behavioral: Negative for confusion and hallucinations.     Physical Exam Updated Vital Signs BP 127/75   Pulse (!) 104   Temp 98.1 F (36.7 C) (Oral)   Resp 18   Ht 6' (1.829 m)   Wt 86.2 kg (190 lb)   SpO2 98%   BMI 25.77 kg/m   Physical Exam  Constitutional: He is oriented to person, place, and time. He appears well-developed and well-nourished.  Non-toxic appearance.  HENT:  Head: Normocephalic.  Right Ear: Tympanic membrane and external ear normal.  Left Ear: Tympanic membrane and external ear normal.  Eyes: Pupils are equal, round, and reactive to light. EOM and lids are normal.  Neck: Normal range of motion. Neck supple. Carotid bruit is not present.  Cardiovascular: Normal rate, regular rhythm, normal heart sounds, intact distal pulses and normal pulses.   Pulmonary/Chest: Breath sounds normal. No respiratory distress.  Abdominal: Soft. Bowel sounds are normal. There is no tenderness. There is no guarding.  Musculoskeletal: Normal range of motion.       Left hand: He exhibits laceration.       Hands: Full range of motion of the left thumb. Patient can flex and extend against resistance without problem.  Lymphadenopathy:       Head (right side): No submandibular adenopathy present.       Head (left side): No submandibular adenopathy present.    He has no cervical adenopathy.  Neurological: He is alert and oriented to person, place, and time. He has normal strength. No cranial nerve deficit or sensory deficit.  No sensory deficit or abnormality involving the right or left upper extremity.  Skin: Skin is warm and dry.  Psychiatric: He has a normal mood and affect. His speech is normal.  Nursing note and vitals reviewed.    ED Treatments / Results  Labs (all labs ordered are listed, but only abnormal results are displayed) Labs Reviewed - No data to display  EKG  EKG Interpretation None       Radiology No results  found.  Procedures .Marland KitchenLaceration Repair Date/Time: 07/02/2017 9:12 PM Performed by: Ivery Quale Authorized by: Ivery Quale   Consent:    Consent obtained:  Verbal   Risks discussed:  Infection, pain and poor wound healing Anesthesia (see MAR for exact dosages):    Anesthesia method:  Local infiltration   Local anesthetic:  Lidocaine 1% w/o epi Laceration details:    Location:  Finger   Finger location:  L thumb   Length (cm):  1.4 Repair type:    Repair type:  Simple Exploration:    Hemostasis achieved with:  Direct pressure   Wound extent: no foreign bodies/material noted, no tendon damage noted and no vascular damage noted   Treatment:    Area cleansed with:  Betadine   Amount of cleaning:  Standard   Irrigation solution:  Sterile saline Skin repair:    Repair method:  Sutures   Suture size:  4-0   Suture material:  Nylon   Number of sutures:  4 Approximation:    Approximation:  Close Post-procedure details:    Dressing:  Sterile dressing   Patient tolerance of procedure:  Tolerated well, no immediate complications   (including critical care time)  Medications Ordered in ED Medications  povidone-iodine (BETADINE) 10 % external solution (not administered)  lidocaine (PF) (XYLOCAINE) 1 % injection (not administered)  Tdap (BOOSTRIX) injection 0.5 mL (not administered)     Initial Impression / Assessment and Plan / ED Course  I have reviewed the triage vital signs and the nursing notes.  Pertinent labs & imaging results that were available during my care of the patient were reviewed by me and considered in my medical decision making (see chart for details).       Final Clinical Impressions(s) / ED Diagnoses MDM  Vital signs reviewed. Patient sustained laceration to the left thumb. Laceration was repaired with 4 interrupted sutures of 4-0 nylon. Patient given instructions on keeping the wound clean and dry and to have the sutures removed in 7 days.  Questions were answered. Patient will return to the emergency department sooner if any signs of infection. Tetanus status updated tonight.   Final diagnoses:  Laceration of left index finger without foreign body without damage to nail, initial encounter    New Prescriptions New Prescriptions   No medications on file     Duayne CalBryant, Emmagrace Runkel, PA-C 07/02/17 2118    Donnetta Hutchingook, Brian, MD 07/03/17 [phone]

## 2017-07-02 NOTE — ED Notes (Signed)
Pt given basin, sterile water, and betadine to soak finger in.

## 2017-07-02 NOTE — ED Triage Notes (Signed)
Pt cut left thumb with knife today by accident. Unknown last tetanus.

## 2017-08-13 DIAGNOSIS — E108 Type 1 diabetes mellitus with unspecified complications: Secondary | ICD-10-CM | POA: Diagnosis not present

## 2017-08-13 DIAGNOSIS — Z794 Long term (current) use of insulin: Secondary | ICD-10-CM | POA: Diagnosis not present

## 2017-08-13 DIAGNOSIS — E109 Type 1 diabetes mellitus without complications: Secondary | ICD-10-CM | POA: Diagnosis not present

## 2017-08-13 DIAGNOSIS — E1065 Type 1 diabetes mellitus with hyperglycemia: Secondary | ICD-10-CM | POA: Diagnosis not present

## 2017-09-24 DIAGNOSIS — E782 Mixed hyperlipidemia: Secondary | ICD-10-CM | POA: Diagnosis not present

## 2017-09-24 DIAGNOSIS — D649 Anemia, unspecified: Secondary | ICD-10-CM | POA: Diagnosis not present

## 2017-09-24 DIAGNOSIS — E1065 Type 1 diabetes mellitus with hyperglycemia: Secondary | ICD-10-CM | POA: Diagnosis not present

## 2017-10-01 DIAGNOSIS — E1065 Type 1 diabetes mellitus with hyperglycemia: Secondary | ICD-10-CM | POA: Diagnosis not present

## 2017-10-14 DIAGNOSIS — N529 Male erectile dysfunction, unspecified: Secondary | ICD-10-CM | POA: Diagnosis not present

## 2017-11-10 DIAGNOSIS — Z794 Long term (current) use of insulin: Secondary | ICD-10-CM | POA: Diagnosis not present

## 2017-11-10 DIAGNOSIS — E109 Type 1 diabetes mellitus without complications: Secondary | ICD-10-CM | POA: Diagnosis not present

## 2017-11-10 DIAGNOSIS — E108 Type 1 diabetes mellitus with unspecified complications: Secondary | ICD-10-CM | POA: Diagnosis not present

## 2017-11-10 DIAGNOSIS — E1065 Type 1 diabetes mellitus with hyperglycemia: Secondary | ICD-10-CM | POA: Diagnosis not present

## 2017-11-11 DIAGNOSIS — N528 Other male erectile dysfunction: Secondary | ICD-10-CM | POA: Diagnosis not present

## 2017-11-11 DIAGNOSIS — E1065 Type 1 diabetes mellitus with hyperglycemia: Secondary | ICD-10-CM | POA: Diagnosis not present

## 2017-12-10 DIAGNOSIS — N5201 Erectile dysfunction due to arterial insufficiency: Secondary | ICD-10-CM | POA: Diagnosis not present

## 2017-12-20 DIAGNOSIS — E1065 Type 1 diabetes mellitus with hyperglycemia: Secondary | ICD-10-CM | POA: Diagnosis not present

## 2017-12-20 DIAGNOSIS — N528 Other male erectile dysfunction: Secondary | ICD-10-CM | POA: Diagnosis not present

## 2017-12-20 DIAGNOSIS — E782 Mixed hyperlipidemia: Secondary | ICD-10-CM | POA: Diagnosis not present

## 2017-12-30 DIAGNOSIS — E1065 Type 1 diabetes mellitus with hyperglycemia: Secondary | ICD-10-CM | POA: Diagnosis not present

## 2018-01-23 DIAGNOSIS — E1065 Type 1 diabetes mellitus with hyperglycemia: Secondary | ICD-10-CM | POA: Diagnosis not present

## 2018-01-23 DIAGNOSIS — Z6824 Body mass index (BMI) 24.0-24.9, adult: Secondary | ICD-10-CM | POA: Diagnosis not present

## 2018-02-10 DIAGNOSIS — M2550 Pain in unspecified joint: Secondary | ICD-10-CM | POA: Diagnosis not present

## 2018-02-10 DIAGNOSIS — R5382 Chronic fatigue, unspecified: Secondary | ICD-10-CM | POA: Diagnosis not present

## 2018-02-10 DIAGNOSIS — M79641 Pain in right hand: Secondary | ICD-10-CM | POA: Diagnosis not present

## 2018-02-10 DIAGNOSIS — M79642 Pain in left hand: Secondary | ICD-10-CM | POA: Diagnosis not present

## 2018-02-24 DIAGNOSIS — M79641 Pain in right hand: Secondary | ICD-10-CM | POA: Diagnosis not present

## 2018-02-24 DIAGNOSIS — R5382 Chronic fatigue, unspecified: Secondary | ICD-10-CM | POA: Diagnosis not present

## 2018-02-24 DIAGNOSIS — M2550 Pain in unspecified joint: Secondary | ICD-10-CM | POA: Diagnosis not present

## 2018-02-24 DIAGNOSIS — M79642 Pain in left hand: Secondary | ICD-10-CM | POA: Diagnosis not present

## 2018-03-20 DIAGNOSIS — E108 Type 1 diabetes mellitus with unspecified complications: Secondary | ICD-10-CM | POA: Diagnosis not present

## 2018-03-20 DIAGNOSIS — E109 Type 1 diabetes mellitus without complications: Secondary | ICD-10-CM | POA: Diagnosis not present

## 2018-03-20 DIAGNOSIS — Z794 Long term (current) use of insulin: Secondary | ICD-10-CM | POA: Diagnosis not present

## 2018-03-20 DIAGNOSIS — E1065 Type 1 diabetes mellitus with hyperglycemia: Secondary | ICD-10-CM | POA: Diagnosis not present

## 2018-04-07 DIAGNOSIS — E1065 Type 1 diabetes mellitus with hyperglycemia: Secondary | ICD-10-CM | POA: Diagnosis not present

## 2018-04-07 DIAGNOSIS — N528 Other male erectile dysfunction: Secondary | ICD-10-CM | POA: Diagnosis not present

## 2018-04-07 DIAGNOSIS — G9009 Other idiopathic peripheral autonomic neuropathy: Secondary | ICD-10-CM | POA: Diagnosis not present

## 2018-04-07 DIAGNOSIS — M19049 Primary osteoarthritis, unspecified hand: Secondary | ICD-10-CM | POA: Diagnosis not present

## 2018-05-05 DIAGNOSIS — N5201 Erectile dysfunction due to arterial insufficiency: Secondary | ICD-10-CM | POA: Diagnosis not present

## 2018-05-06 DIAGNOSIS — M79642 Pain in left hand: Secondary | ICD-10-CM | POA: Diagnosis not present

## 2018-05-06 DIAGNOSIS — M0609 Rheumatoid arthritis without rheumatoid factor, multiple sites: Secondary | ICD-10-CM | POA: Diagnosis not present

## 2018-05-06 DIAGNOSIS — M2550 Pain in unspecified joint: Secondary | ICD-10-CM | POA: Diagnosis not present

## 2018-05-06 DIAGNOSIS — M79641 Pain in right hand: Secondary | ICD-10-CM | POA: Diagnosis not present

## 2018-05-12 DIAGNOSIS — E1065 Type 1 diabetes mellitus with hyperglycemia: Secondary | ICD-10-CM | POA: Diagnosis not present

## 2018-05-12 DIAGNOSIS — E782 Mixed hyperlipidemia: Secondary | ICD-10-CM | POA: Diagnosis not present

## 2018-05-19 DIAGNOSIS — E782 Mixed hyperlipidemia: Secondary | ICD-10-CM | POA: Diagnosis not present

## 2018-05-19 DIAGNOSIS — E1040 Type 1 diabetes mellitus with diabetic neuropathy, unspecified: Secondary | ICD-10-CM | POA: Diagnosis not present

## 2018-06-10 DIAGNOSIS — M059 Rheumatoid arthritis with rheumatoid factor, unspecified: Secondary | ICD-10-CM | POA: Diagnosis not present

## 2018-06-10 DIAGNOSIS — G5603 Carpal tunnel syndrome, bilateral upper limbs: Secondary | ICD-10-CM | POA: Diagnosis not present

## 2018-06-10 DIAGNOSIS — E1042 Type 1 diabetes mellitus with diabetic polyneuropathy: Secondary | ICD-10-CM | POA: Diagnosis not present

## 2018-06-10 DIAGNOSIS — G5623 Lesion of ulnar nerve, bilateral upper limbs: Secondary | ICD-10-CM | POA: Diagnosis not present

## 2018-06-20 DIAGNOSIS — Z794 Long term (current) use of insulin: Secondary | ICD-10-CM | POA: Diagnosis not present

## 2018-06-20 DIAGNOSIS — E1065 Type 1 diabetes mellitus with hyperglycemia: Secondary | ICD-10-CM | POA: Diagnosis not present

## 2018-06-20 DIAGNOSIS — E108 Type 1 diabetes mellitus with unspecified complications: Secondary | ICD-10-CM | POA: Diagnosis not present

## 2018-06-26 DIAGNOSIS — E1042 Type 1 diabetes mellitus with diabetic polyneuropathy: Secondary | ICD-10-CM | POA: Diagnosis not present

## 2018-06-26 DIAGNOSIS — G5601 Carpal tunnel syndrome, right upper limb: Secondary | ICD-10-CM | POA: Diagnosis not present

## 2018-06-26 DIAGNOSIS — G5622 Lesion of ulnar nerve, left upper limb: Secondary | ICD-10-CM | POA: Diagnosis not present

## 2018-06-26 DIAGNOSIS — G5602 Carpal tunnel syndrome, left upper limb: Secondary | ICD-10-CM | POA: Diagnosis not present

## 2018-06-27 DIAGNOSIS — G5623 Lesion of ulnar nerve, bilateral upper limbs: Secondary | ICD-10-CM | POA: Diagnosis not present

## 2018-06-27 DIAGNOSIS — G5603 Carpal tunnel syndrome, bilateral upper limbs: Secondary | ICD-10-CM | POA: Diagnosis not present

## 2018-06-27 DIAGNOSIS — E109 Type 1 diabetes mellitus without complications: Secondary | ICD-10-CM | POA: Diagnosis not present

## 2018-06-27 DIAGNOSIS — M0600 Rheumatoid arthritis without rheumatoid factor, unspecified site: Secondary | ICD-10-CM | POA: Diagnosis not present

## 2018-07-14 DIAGNOSIS — G5622 Lesion of ulnar nerve, left upper limb: Secondary | ICD-10-CM | POA: Diagnosis not present

## 2018-07-14 DIAGNOSIS — G5602 Carpal tunnel syndrome, left upper limb: Secondary | ICD-10-CM | POA: Diagnosis not present

## 2018-07-23 DIAGNOSIS — E1040 Type 1 diabetes mellitus with diabetic neuropathy, unspecified: Secondary | ICD-10-CM | POA: Diagnosis not present

## 2018-07-23 DIAGNOSIS — E782 Mixed hyperlipidemia: Secondary | ICD-10-CM | POA: Diagnosis not present

## 2018-07-28 DIAGNOSIS — M25522 Pain in left elbow: Secondary | ICD-10-CM | POA: Diagnosis not present

## 2018-07-29 DIAGNOSIS — E782 Mixed hyperlipidemia: Secondary | ICD-10-CM | POA: Diagnosis not present

## 2018-07-29 DIAGNOSIS — E1065 Type 1 diabetes mellitus with hyperglycemia: Secondary | ICD-10-CM | POA: Diagnosis not present

## 2018-07-29 DIAGNOSIS — M19049 Primary osteoarthritis, unspecified hand: Secondary | ICD-10-CM | POA: Diagnosis not present

## 2018-07-29 DIAGNOSIS — F319 Bipolar disorder, unspecified: Secondary | ICD-10-CM | POA: Diagnosis not present

## 2018-08-13 DIAGNOSIS — M25522 Pain in left elbow: Secondary | ICD-10-CM | POA: Diagnosis not present

## 2018-09-05 DIAGNOSIS — M0609 Rheumatoid arthritis without rheumatoid factor, multiple sites: Secondary | ICD-10-CM | POA: Diagnosis not present

## 2018-09-05 DIAGNOSIS — M2550 Pain in unspecified joint: Secondary | ICD-10-CM | POA: Diagnosis not present

## 2018-09-05 DIAGNOSIS — M79642 Pain in left hand: Secondary | ICD-10-CM | POA: Diagnosis not present

## 2018-09-05 DIAGNOSIS — M79641 Pain in right hand: Secondary | ICD-10-CM | POA: Diagnosis not present

## 2018-09-22 DIAGNOSIS — Z794 Long term (current) use of insulin: Secondary | ICD-10-CM | POA: Diagnosis not present

## 2018-09-22 DIAGNOSIS — E108 Type 1 diabetes mellitus with unspecified complications: Secondary | ICD-10-CM | POA: Diagnosis not present

## 2018-09-22 DIAGNOSIS — E1065 Type 1 diabetes mellitus with hyperglycemia: Secondary | ICD-10-CM | POA: Diagnosis not present

## 2018-09-22 DIAGNOSIS — E109 Type 1 diabetes mellitus without complications: Secondary | ICD-10-CM | POA: Diagnosis not present

## 2018-09-24 DIAGNOSIS — Z794 Long term (current) use of insulin: Secondary | ICD-10-CM | POA: Diagnosis not present

## 2018-09-24 DIAGNOSIS — E109 Type 1 diabetes mellitus without complications: Secondary | ICD-10-CM | POA: Diagnosis not present

## 2018-09-24 DIAGNOSIS — E108 Type 1 diabetes mellitus with unspecified complications: Secondary | ICD-10-CM | POA: Diagnosis not present

## 2018-09-24 DIAGNOSIS — E1065 Type 1 diabetes mellitus with hyperglycemia: Secondary | ICD-10-CM | POA: Diagnosis not present

## 2018-10-30 DIAGNOSIS — Z6825 Body mass index (BMI) 25.0-25.9, adult: Secondary | ICD-10-CM | POA: Diagnosis not present

## 2018-10-30 DIAGNOSIS — E1065 Type 1 diabetes mellitus with hyperglycemia: Secondary | ICD-10-CM | POA: Diagnosis not present

## 2018-10-30 DIAGNOSIS — E782 Mixed hyperlipidemia: Secondary | ICD-10-CM | POA: Diagnosis not present

## 2018-10-30 DIAGNOSIS — N528 Other male erectile dysfunction: Secondary | ICD-10-CM | POA: Diagnosis not present

## 2018-10-30 DIAGNOSIS — E1040 Type 1 diabetes mellitus with diabetic neuropathy, unspecified: Secondary | ICD-10-CM | POA: Diagnosis not present

## 2018-11-10 DIAGNOSIS — Z716 Tobacco abuse counseling: Secondary | ICD-10-CM | POA: Diagnosis not present

## 2018-11-10 DIAGNOSIS — E782 Mixed hyperlipidemia: Secondary | ICD-10-CM | POA: Diagnosis not present

## 2018-11-10 DIAGNOSIS — E1021 Type 1 diabetes mellitus with diabetic nephropathy: Secondary | ICD-10-CM | POA: Diagnosis not present

## 2018-11-10 DIAGNOSIS — G9009 Other idiopathic peripheral autonomic neuropathy: Secondary | ICD-10-CM | POA: Diagnosis not present

## 2018-11-10 DIAGNOSIS — M1990 Unspecified osteoarthritis, unspecified site: Secondary | ICD-10-CM | POA: Diagnosis not present

## 2018-12-05 DIAGNOSIS — M79641 Pain in right hand: Secondary | ICD-10-CM | POA: Diagnosis not present

## 2018-12-05 DIAGNOSIS — M79642 Pain in left hand: Secondary | ICD-10-CM | POA: Diagnosis not present

## 2018-12-05 DIAGNOSIS — M0609 Rheumatoid arthritis without rheumatoid factor, multiple sites: Secondary | ICD-10-CM | POA: Diagnosis not present

## 2018-12-05 DIAGNOSIS — M2550 Pain in unspecified joint: Secondary | ICD-10-CM | POA: Diagnosis not present

## 2019-03-19 DIAGNOSIS — E782 Mixed hyperlipidemia: Secondary | ICD-10-CM | POA: Diagnosis not present

## 2019-03-19 DIAGNOSIS — G9009 Other idiopathic peripheral autonomic neuropathy: Secondary | ICD-10-CM | POA: Diagnosis not present

## 2019-03-19 DIAGNOSIS — F319 Bipolar disorder, unspecified: Secondary | ICD-10-CM | POA: Diagnosis not present

## 2019-03-19 DIAGNOSIS — E1040 Type 1 diabetes mellitus with diabetic neuropathy, unspecified: Secondary | ICD-10-CM | POA: Diagnosis not present

## 2019-03-21 ENCOUNTER — Encounter (HOSPITAL_COMMUNITY): Payer: Self-pay | Admitting: Emergency Medicine

## 2019-03-21 ENCOUNTER — Other Ambulatory Visit: Payer: Self-pay

## 2019-03-21 ENCOUNTER — Emergency Department (HOSPITAL_COMMUNITY): Payer: BLUE CROSS/BLUE SHIELD

## 2019-03-21 ENCOUNTER — Emergency Department (HOSPITAL_COMMUNITY)
Admission: EM | Admit: 2019-03-21 | Discharge: 2019-03-21 | Disposition: A | Discharge status: Home / Self Care | Payer: BLUE CROSS/BLUE SHIELD | Attending: Emergency Medicine | Admitting: Emergency Medicine

## 2019-03-21 DIAGNOSIS — F17210 Nicotine dependence, cigarettes, uncomplicated: Secondary | ICD-10-CM | POA: Insufficient documentation

## 2019-03-21 DIAGNOSIS — R55 Syncope and collapse: Secondary | ICD-10-CM | POA: Diagnosis not present

## 2019-03-21 DIAGNOSIS — E109 Type 1 diabetes mellitus without complications: Secondary | ICD-10-CM | POA: Diagnosis not present

## 2019-03-21 DIAGNOSIS — R7989 Other specified abnormal findings of blood chemistry: Secondary | ICD-10-CM | POA: Diagnosis not present

## 2019-03-21 DIAGNOSIS — E860 Dehydration: Secondary | ICD-10-CM | POA: Diagnosis not present

## 2019-03-21 DIAGNOSIS — E1165 Type 2 diabetes mellitus with hyperglycemia: Secondary | ICD-10-CM | POA: Diagnosis not present

## 2019-03-21 DIAGNOSIS — R42 Dizziness and giddiness: Secondary | ICD-10-CM | POA: Diagnosis not present

## 2019-03-21 LAB — CBC WITH DIFFERENTIAL/PLATELET
Abs Immature Granulocytes: 0.06 10*3/uL (ref 0.00–0.07)
Basophils Absolute: 0.1 10*3/uL (ref 0.0–0.1)
Basophils Relative: 1 %
Eosinophils Absolute: 0.1 10*3/uL (ref 0.0–0.5)
Eosinophils Relative: 1 %
HCT: 38.2 % — ABNORMAL LOW (ref 39.0–52.0)
Hemoglobin: 12.6 g/dL — ABNORMAL LOW (ref 13.0–17.0)
Immature Granulocytes: 0 %
Lymphocytes Relative: 11 %
Lymphs Abs: 1.7 10*3/uL (ref 0.7–4.0)
MCH: 29.7 pg (ref 26.0–34.0)
MCHC: 33 g/dL (ref 30.0–36.0)
MCV: 90.1 fL (ref 80.0–100.0)
Monocytes Absolute: 0.7 10*3/uL (ref 0.1–1.0)
Monocytes Relative: 4 %
Neutro Abs: 13.6 10*3/uL — ABNORMAL HIGH (ref 1.7–7.7)
Neutrophils Relative %: 83 %
Platelets: 285 10*3/uL (ref 150–400)
RBC: 4.24 MIL/uL (ref 4.22–5.81)
RDW: 13.1 % (ref 11.5–15.5)
WBC: 16.3 10*3/uL — ABNORMAL HIGH (ref 4.0–10.5)
nRBC: 0 % (ref 0.0–0.2)

## 2019-03-21 LAB — D-DIMER, QUANTITATIVE: D-Dimer, Quant: <0.27 ug/mL-FEU (ref 0.00–0.50)

## 2019-03-21 LAB — COMPREHENSIVE METABOLIC PANEL
ALT: 26 U/L (ref 0–44)
AST: 17 U/L (ref 15–41)
Albumin: 4.5 g/dL (ref 3.5–5.0)
Alkaline Phosphatase: 64 U/L (ref 38–126)
Anion gap: 12 (ref 5–15)
BUN: 37 mg/dL — ABNORMAL HIGH (ref 6–20)
CO2: 22 mmol/L (ref 22–32)
Calcium: 9.1 mg/dL (ref 8.9–10.3)
Chloride: 101 mmol/L (ref 98–111)
Creatinine, Ser: 2.04 mg/dL — ABNORMAL HIGH (ref 0.61–1.24)
GFR calc Af Amer: 46 mL/min — ABNORMAL LOW (ref >60)
GFR calc non Af Amer: 40 mL/min — ABNORMAL LOW (ref >60)
Glucose, Bld: 304 mg/dL — ABNORMAL HIGH (ref 70–99)
Potassium: 4.3 mmol/L (ref 3.5–5.1)
Sodium: 135 mmol/L (ref 135–145)
Total Bilirubin: 0.4 mg/dL (ref 0.3–1.2)
Total Protein: 7.7 g/dL (ref 6.5–8.1)

## 2019-03-21 LAB — TROPONIN I: Troponin I: <0.03 ng/mL (ref <0.03)

## 2019-03-21 LAB — CBG MONITORING, ED: Glucose-Capillary: 287 mg/dL — ABNORMAL HIGH (ref 70–99)

## 2019-03-21 MED ORDER — SODIUM CHLORIDE 0.9 % IV BOLUS
1000.0000 mL | Freq: Once | INTRAVENOUS | Status: AC
  Administered 2019-03-21: 1000 mL via INTRAVENOUS

## 2019-03-21 NOTE — Discharge Instructions (Addendum)
You have been seen today in the Emergency Department (ED)  for syncope (passing out).  Your workup including labs and EKG show reassuring results.  Your symptoms may be due to dehydration, so it is important that you drink plenty of non-alcoholic fluids.  Your kidney function was elevated meaning that you kidneys are showing sign of injury. I would expect this to improve with the IV fluids given today but you will need to have these labs (BMP) re-checked through your PCP next week without fail. Continue to avoid soda, coffee, and alcoholic beverages as these can cause dehydration.   Please call your regular doctor as soon as possible to schedule the next available clinic appointment to follow up with him/her regarding your visit to the ED and your symptoms.  Return to the Emergency Department (ED)  if you have any further syncopal episodes (pass out again) or develop ANY chest pain, pressure, tightness, trouble breathing, sudden sweating, or other symptoms that concern you.

## 2019-03-21 NOTE — ED Triage Notes (Signed)
Per EMS patient from home sue to syncopal episode. Patient states he was working out in parents yard and passed out. EMS states patient was orthostatic with a BP of 112/60 sitting and 80/60 standing. Patient states history of Type 1 diabetes and uses an insulin pump.

## 2019-03-21 NOTE — ED Provider Notes (Signed)
Emergency Department Provider Note   I have reviewed the triage vital signs and the nursing notes.   HISTORY  Chief Complaint Near Syncope   HPI Tyler Mann is a 38 y.o. male with PMH of ADD, Bipolar disorder, IDDM with insulin pump, and remote history of drug abuse is to the emergency department by EMS after syncopal event.  Patient states he was working outside today when he began to feel lightheaded.  He states he noticed symptoms starting around noon today.  He felt worse when he stood up and improved with sitting still.  He went back to the house to try and drink some water and upon standing he had a syncopal event.  This was witnessed by family.  They deny any seizure activity per EMS.  Patient states he was out for several seconds and then came back to.  He is not experiencing any chest pain, or palpitations, shortness of breath, sudden headache, or abdominal pain.  His blood sugars have been running high but he has remained compliant with his insulin pump.  He does not feel like he is in DKA.  He denies any fever, cough, sore throat.  EMS arrived and found the patient to be hyperglycemic and orthostatic on scene. Patient states he did try to increase with PO water intake after starting to feel lightheaded but does not normally drink water the way he should.   Past Medical History:  Diagnosis Date  . ADD (attention deficit disorder)   . Bipolar disorder (HCC)   . Chronic back pain   . Diabetes mellitus    type 1  . Narcotic addiction (HCC)    Hx IV heroin abuse  . Neuropathy     Patient Active Problem List   Diagnosis Date Noted  . Opioid dependence with intoxication with complication (HCC)   . Medication overdose   . Major depressive disorder, recurrent, severe without psychotic features (HCC)   . MDD (major depressive disorder), recurrent episode, severe (HCC) 12/30/2014  . Drug overdose, intentional (HCC) 12/26/2014  . Hypoglycemia 12/26/2014  . Overdose of  benzodiazepine   . Overdose of insulin   . Substance induced mood disorder (HCC) 12/08/2014  . Hyperglycemia without ketosis   . Diabetes type 1, uncontrolled (HCC)   . Suicidal ideation 12/05/2014    Class: Acute  . Hyperglycemia 12/03/2014  . Substance abuse (HCC) 12/03/2014  . Hypokalemia 11/03/2014  . Leukocytosis 11/02/2014  . Hyperkalemia 11/02/2014  . Chronic back pain 11/02/2014  . Hematemesis 11/02/2014  . Generalized anxiety disorder 11/02/2014  . DKA (diabetic ketoacidoses) (HCC) 11/19/2013  . DKA, type 1 (HCC) 11/22/2012  . Opiate dependence (HCC) 11/22/2012  . Tobacco abuse 11/22/2012  . Diabetic nephropathy (HCC) 11/22/2012  . Diabetic ketoacidosis (HCC) 09/20/2012  . Diabetes mellitus type 1 (HCC) 09/20/2012  . Chronic pain 09/20/2012  . Hyperlipidemia 09/20/2012    Past Surgical History:  Procedure Laterality Date  . INCISION AND DRAINAGE    . MOUTH SURGERY      Allergies Trazodone and nefazodone  No family history on file.  Social History Social History   Tobacco Use  . Smoking status: Current Every Day Smoker    Packs/day: 1.00    Years: 17.00    Pack years: 17.00    Types: Cigarettes  . Smokeless tobacco: Current User  Substance Use Topics  . Alcohol use: Yes    Comment: occasionally   . Drug use: Yes    Types: Methamphetamines, Heroin  Comment: recovering from heroin-     Review of Systems  Constitutional: No fever/chills Eyes: No visual changes. ENT: No sore throat. Cardiovascular: Denies chest pain. Positive syncope.  Respiratory: Denies shortness of breath. Gastrointestinal: No abdominal pain.  No nausea, no vomiting.  No diarrhea.  No constipation. Genitourinary: Negative for dysuria. Musculoskeletal: Negative for back pain. Skin: Negative for rash. Neurological: Negative for headaches, focal weakness or numbness.  10-point ROS otherwise negative.  ____________________________________________   PHYSICAL EXAM:  VITAL  SIGNS: ED Triage Vitals  Enc Vitals Group     BP 03/21/19 1619 110/77     Pulse Rate 03/21/19 1619 92     Resp 03/21/19 1619 17     Temp 03/21/19 1619 98 F (36.7 C)     Temp Source 03/21/19 1619 Oral     SpO2 03/21/19 1619 95 %     Weight 03/21/19 1621 175 lb (79.4 kg)     Height 03/21/19 1621 6\' 1"  (1.854 m)   Constitutional: Alert and oriented. Well appearing and in no acute distress. Eyes: Conjunctivae are normal.  Head: Atraumatic. Nose: No congestion/rhinnorhea. Mouth/Throat: Mucous membranes are somewhat dry.  Neck: No stridor.   Cardiovascular: Normal rate, regular rhythm. Good peripheral circulation. Grossly normal heart sounds.   Respiratory: Normal respiratory effort.  No retractions. Lungs CTAB. Gastrointestinal: Soft and nontender. No distention.  Musculoskeletal: No lower extremity tenderness nor edema. No gross deformities of extremities. Neurologic:  Normal speech and language. No gross focal neurologic deficits are appreciated.  Skin:  Skin is warm, dry and intact. No rash noted.  ____________________________________________   LABS (all labs ordered are listed, but only abnormal results are displayed)  Labs Reviewed  COMPREHENSIVE METABOLIC PANEL - Abnormal; Notable for the following components:      Result Value   Glucose, Bld 304 (*)    BUN 37 (*)    Creatinine, Ser 2.04 (*)    GFR calc non Af Amer 40 (*)    GFR calc Af Amer 46 (*)    All other components within normal limits  CBC WITH DIFFERENTIAL/PLATELET - Abnormal; Notable for the following components:   WBC 16.3 (*)    Hemoglobin 12.6 (*)    HCT 38.2 (*)    Neutro Abs 13.6 (*)    All other components within normal limits  CBG MONITORING, ED - Abnormal; Notable for the following components:   Glucose-Capillary 287 (*)    All other components within normal limits  TROPONIN I  D-DIMER, QUANTITATIVE (NOT [facility])   ____________________________________________  EKG   EKG Interpretation   Date/Time:  Saturday Mar 21 2019 16:13:37 EDT Ventricular Rate:  93 PR Interval:    QRS Duration: 98 QT Interval:  350 QTC Calculation: 436 R Axis:   80 Text Interpretation:  Sinus rhythm Borderline short PR interval Left ventricular hypertrophy No STEMI.  Confirmed by Alona BeneLong, Joshua [id number]) on 03/21/2019 4:40:57 PM       ____________________________________________  RADIOLOGY  Dg Chest 2 View  Result Date: 03/21/2019 CLINICAL DATA:  Syncope EXAM: CHEST - 2 VIEW COMPARISON:  None. FINDINGS: The heart size and mediastinal contours are within normal limits. Both lungs are clear. The visualized skeletal structures are unremarkable. IMPRESSION: No active cardiopulmonary disease. Electronically Signed   By: Elige KoHetal  Patel   On: 03/21/2019 17:01    ____________________________________________   PROCEDURES  Procedure(s) performed:   Procedures  None  ____________________________________________   INITIAL IMPRESSION / ASSESSMENT AND PLAN / ED COURSE  Pertinent labs &  imaging results that were available during my care of the patient were reviewed by me and considered in my medical decision making (see chart for details).   Patient presents to the emergency department for evaluation after syncopal event at home.  He was orthostatic on scene with EMS and notes that this occurred in the setting of working outside and possibly being dehydrated.  He denies any drug or alcohol use.  No chest pain, palpitation, shortness of breath.  Very low suspicion for PE but will add on d-dimer given his tachycardia 101 while I was at bedside. No hypoxemia.  Better after IV fluids.  Noted to be hyperglycemic with EMS.  Insulin pump appears to be functioning properly.  Very low suspicion for DKA.  Lower suspicion for cardiogenic syncope.  Plan for repeat orthostatics, IV fluids, screening labs, reassess.  06:26 PM  Lab work reviewed.  Patient does have elevated creatinine but my only reference lab is 4 years  prior.  BUN is also elevated.  This does correlate with the patient's clinical status of dehydration.  He is feeling "much better" after IVF.  Tachycardia has improved.  No hypotension.  Patient not in DKA.  Troponin and d-dimer are normal.  Chest x-ray negative.  He is looking very well.  I advised doing an additional liter of IV fluids and rechecking his kidney function prior to discharge.  Patient states he is okay with additional fluid but would prefer to follow with Dr. Margo Aye on Monday and have repeat lab testing done as an outpatient.  This seems reasonable given his clinical status and close PCP follow-up.  I advised that labs should be completed next week.  Patient understands this.  ____________________________________________  FINAL CLINICAL IMPRESSION(S) / ED DIAGNOSES  Final diagnoses:  Near syncope  Dehydration  Elevated serum creatinine     MEDICATIONS GIVEN DURING THIS VISIT:  Medications  sodium chloride 0.9 % bolus 1,000 mL (0 mLs Intravenous Stopped 03/21/19 1911)  sodium chloride 0.9 % bolus 1,000 mL (1,000 mLs Intravenous New Bag/Given 03/21/19 1913)    Note:  This document was prepared using Dragon voice recognition software and may include unintentional dictation errors.  Alona Bene, MD Emergency Medicine    Long, Arlyss Repress, MD 03/21/19 Barry Brunner

## 2019-04-30 DIAGNOSIS — E1040 Type 1 diabetes mellitus with diabetic neuropathy, unspecified: Secondary | ICD-10-CM | POA: Diagnosis not present

## 2019-04-30 DIAGNOSIS — E1065 Type 1 diabetes mellitus with hyperglycemia: Secondary | ICD-10-CM | POA: Diagnosis not present

## 2019-04-30 DIAGNOSIS — E1021 Type 1 diabetes mellitus with diabetic nephropathy: Secondary | ICD-10-CM | POA: Diagnosis not present

## 2019-04-30 DIAGNOSIS — N529 Male erectile dysfunction, unspecified: Secondary | ICD-10-CM | POA: Diagnosis not present

## 2019-04-30 DIAGNOSIS — E782 Mixed hyperlipidemia: Secondary | ICD-10-CM | POA: Diagnosis not present

## 2019-05-04 DIAGNOSIS — E782 Mixed hyperlipidemia: Secondary | ICD-10-CM | POA: Diagnosis not present

## 2019-05-04 DIAGNOSIS — E1040 Type 1 diabetes mellitus with diabetic neuropathy, unspecified: Secondary | ICD-10-CM | POA: Diagnosis not present

## 2019-05-04 DIAGNOSIS — F319 Bipolar disorder, unspecified: Secondary | ICD-10-CM | POA: Diagnosis not present

## 2019-05-04 DIAGNOSIS — G9009 Other idiopathic peripheral autonomic neuropathy: Secondary | ICD-10-CM | POA: Diagnosis not present

## 2019-06-16 DIAGNOSIS — G9009 Other idiopathic peripheral autonomic neuropathy: Secondary | ICD-10-CM | POA: Diagnosis not present

## 2019-06-16 DIAGNOSIS — E1040 Type 1 diabetes mellitus with diabetic neuropathy, unspecified: Secondary | ICD-10-CM | POA: Diagnosis not present

## 2019-07-15 DIAGNOSIS — M79641 Pain in right hand: Secondary | ICD-10-CM | POA: Diagnosis not present

## 2019-07-15 DIAGNOSIS — M79642 Pain in left hand: Secondary | ICD-10-CM | POA: Diagnosis not present

## 2019-07-15 DIAGNOSIS — M0609 Rheumatoid arthritis without rheumatoid factor, multiple sites: Secondary | ICD-10-CM | POA: Diagnosis not present

## 2019-07-15 DIAGNOSIS — E1065 Type 1 diabetes mellitus with hyperglycemia: Secondary | ICD-10-CM | POA: Diagnosis not present

## 2019-07-15 DIAGNOSIS — M2550 Pain in unspecified joint: Secondary | ICD-10-CM | POA: Diagnosis not present

## 2019-10-07 DIAGNOSIS — E782 Mixed hyperlipidemia: Secondary | ICD-10-CM | POA: Diagnosis not present

## 2019-10-07 DIAGNOSIS — F319 Bipolar disorder, unspecified: Secondary | ICD-10-CM | POA: Diagnosis not present

## 2019-10-07 DIAGNOSIS — E1040 Type 1 diabetes mellitus with diabetic neuropathy, unspecified: Secondary | ICD-10-CM | POA: Diagnosis not present

## 2019-10-07 DIAGNOSIS — G9009 Other idiopathic peripheral autonomic neuropathy: Secondary | ICD-10-CM | POA: Diagnosis not present

## 2020-05-23 ENCOUNTER — Other Ambulatory Visit: Payer: Self-pay

## 2020-05-23 ENCOUNTER — Emergency Department (HOSPITAL_COMMUNITY)
Admission: EM | Admit: 2020-05-23 | Discharge: 2020-05-24 | Disposition: A | Discharge status: Home / Self Care | Payer: 59 | Attending: Emergency Medicine | Admitting: Emergency Medicine

## 2020-05-23 ENCOUNTER — Encounter (HOSPITAL_COMMUNITY): Payer: Self-pay

## 2020-05-23 ENCOUNTER — Emergency Department (HOSPITAL_COMMUNITY): Payer: 59

## 2020-05-23 DIAGNOSIS — R55 Syncope and collapse: Secondary | ICD-10-CM | POA: Diagnosis not present

## 2020-05-23 DIAGNOSIS — F17210 Nicotine dependence, cigarettes, uncomplicated: Secondary | ICD-10-CM | POA: Diagnosis not present

## 2020-05-23 DIAGNOSIS — E1010 Type 1 diabetes mellitus with ketoacidosis without coma: Secondary | ICD-10-CM | POA: Diagnosis not present

## 2020-05-23 DIAGNOSIS — E1065 Type 1 diabetes mellitus with hyperglycemia: Secondary | ICD-10-CM | POA: Insufficient documentation

## 2020-05-23 DIAGNOSIS — E10649 Type 1 diabetes mellitus with hypoglycemia without coma: Secondary | ICD-10-CM | POA: Diagnosis not present

## 2020-05-23 DIAGNOSIS — E860 Dehydration: Secondary | ICD-10-CM

## 2020-05-23 DIAGNOSIS — N179 Acute kidney failure, unspecified: Secondary | ICD-10-CM

## 2020-05-23 DIAGNOSIS — Z794 Long term (current) use of insulin: Secondary | ICD-10-CM | POA: Diagnosis not present

## 2020-05-23 DIAGNOSIS — R42 Dizziness and giddiness: Secondary | ICD-10-CM | POA: Insufficient documentation

## 2020-05-23 DIAGNOSIS — E1040 Type 1 diabetes mellitus with diabetic neuropathy, unspecified: Secondary | ICD-10-CM | POA: Diagnosis not present

## 2020-05-23 LAB — COMPREHENSIVE METABOLIC PANEL
ALT: 26 U/L (ref 0–44)
AST: 21 U/L (ref 15–41)
Albumin: 4.6 g/dL (ref 3.5–5.0)
Alkaline Phosphatase: 64 U/L (ref 38–126)
Anion gap: 12 (ref 5–15)
BUN: 31 mg/dL — ABNORMAL HIGH (ref 6–20)
CO2: 23 mmol/L (ref 22–32)
Calcium: 9.7 mg/dL (ref 8.9–10.3)
Chloride: 98 mmol/L (ref 98–111)
Creatinine, Ser: 2.8 mg/dL — ABNORMAL HIGH (ref 0.61–1.24)
GFR calc Af Amer: 31 mL/min — ABNORMAL LOW (ref >60)
GFR calc non Af Amer: 27 mL/min — ABNORMAL LOW (ref >60)
Glucose, Bld: 189 mg/dL — ABNORMAL HIGH (ref 70–99)
Potassium: 3.4 mmol/L — ABNORMAL LOW (ref 3.5–5.1)
Sodium: 133 mmol/L — ABNORMAL LOW (ref 135–145)
Total Bilirubin: 0.9 mg/dL (ref 0.3–1.2)
Total Protein: 7.9 g/dL (ref 6.5–8.1)

## 2020-05-23 LAB — URINALYSIS, ROUTINE W REFLEX MICROSCOPIC
Bacteria, UA: NONE SEEN
Bilirubin Urine: NEGATIVE
Glucose, UA: >=500 mg/dL — AB
Ketones, ur: NEGATIVE mg/dL
Leukocytes,Ua: NEGATIVE
Nitrite: NEGATIVE
Protein, ur: NEGATIVE mg/dL
Specific Gravity, Urine: 1.001 — ABNORMAL LOW (ref 1.005–1.030)
pH: 6 (ref 5.0–8.0)

## 2020-05-23 LAB — CBC WITH DIFFERENTIAL/PLATELET
Abs Immature Granulocytes: 0.08 10*3/uL — ABNORMAL HIGH (ref 0.00–0.07)
Basophils Absolute: 0.1 10*3/uL (ref 0.0–0.1)
Basophils Relative: 1 %
Eosinophils Absolute: 0 10*3/uL (ref 0.0–0.5)
Eosinophils Relative: 0 %
HCT: 37.7 % — ABNORMAL LOW (ref 39.0–52.0)
Hemoglobin: 12.4 g/dL — ABNORMAL LOW (ref 13.0–17.0)
Immature Granulocytes: 1 %
Lymphocytes Relative: 16 %
Lymphs Abs: 2.3 10*3/uL (ref 0.7–4.0)
MCH: 29.3 pg (ref 26.0–34.0)
MCHC: 32.9 g/dL (ref 30.0–36.0)
MCV: 89.1 fL (ref 80.0–100.0)
Monocytes Absolute: 1 10*3/uL (ref 0.1–1.0)
Monocytes Relative: 7 %
Neutro Abs: 10.7 10*3/uL — ABNORMAL HIGH (ref 1.7–7.7)
Neutrophils Relative %: 75 %
Platelets: 290 10*3/uL (ref 150–400)
RBC: 4.23 MIL/uL (ref 4.22–5.81)
RDW: 13.1 % (ref 11.5–15.5)
WBC: 14.3 10*3/uL — ABNORMAL HIGH (ref 4.0–10.5)
nRBC: 0 % (ref 0.0–0.2)

## 2020-05-23 LAB — CBG MONITORING, ED: Glucose-Capillary: 184 mg/dL — ABNORMAL HIGH (ref 70–99)

## 2020-05-23 LAB — CK: Total CK: 350 U/L (ref 49–397)

## 2020-05-23 MED ORDER — SODIUM CHLORIDE 0.9 % IV BOLUS
1000.0000 mL | Freq: Once | INTRAVENOUS | Status: AC
  Administered 2020-05-23: 1000 mL via INTRAVENOUS

## 2020-05-23 MED ORDER — LACTATED RINGERS IV BOLUS
1000.0000 mL | Freq: Once | INTRAVENOUS | Status: AC
  Administered 2020-05-23: 1000 mL via INTRAVENOUS

## 2020-05-23 NOTE — ED Triage Notes (Signed)
Pt arrived via [facility] from public location in parking lot CC heat cramps and syncope. Pt denies LOC, head neck or back pain, no blood thinners. Per EMS pt is a/o X4 ambulatory.   20G left FA 800 ml NS given in route   Hx Diabetes Type 1, Pump located on left side. Recovering opioid addict

## 2020-05-23 NOTE — ED Provider Notes (Signed)
[facility]-EMERGENCY DEPT Provider Note   CSN: [id number] Arrival date & time: 05/23/20  2111     History Chief Complaint  Patient presents with  . Near Syncope    Tyler Mann is a 39 y.o. male hx of bipolar, DM, previous heroin abuse here presenting with dizziness and near syncope. Patient states that he works in [employer] and is outside all the time. He states that he was lightheaded and dizzy today and almost passed out so sat down. He states that he was given some fluids and felt better. Denies any nausea or vomiting or fevers or abdominal pain. Patient does take diclofenac at baseline for his arthritis. Patient also has diabetes.   The history is provided by the patient.       Past Medical History:  Diagnosis Date  . ADD (attention deficit disorder)   . Bipolar disorder (HCC)   . Chronic back pain   . Diabetes mellitus    type 1  . Narcotic addiction (HCC)    Hx IV heroin abuse  . Neuropathy     Patient Active Problem List   Diagnosis Date Noted  . Opioid dependence with intoxication with complication (HCC)   . Medication overdose   . Major depressive disorder, recurrent, severe without psychotic features (HCC)   . MDD (major depressive disorder), recurrent episode, severe (HCC) 12/30/2014  . Drug overdose, intentional (HCC) 12/26/2014  . Hypoglycemia 12/26/2014  . Overdose of benzodiazepine   . Overdose of insulin   . Substance induced mood disorder (HCC) 12/08/2014  . Hyperglycemia without ketosis   . Diabetes type 1, uncontrolled (HCC)   . Suicidal ideation 12/05/2014    Class: Acute  . Hyperglycemia 12/03/2014  . Substance abuse (HCC) 12/03/2014  . Hypokalemia 11/03/2014  . Leukocytosis 11/02/2014  . Hyperkalemia 11/02/2014  . Chronic back pain 11/02/2014  . Hematemesis 11/02/2014  . Generalized anxiety disorder 11/02/2014  . DKA (diabetic ketoacidoses) (HCC) 11/19/2013  . DKA, type 1 (HCC) 11/22/2012  . Opiate dependence  (HCC) 11/22/2012  . Tobacco abuse 11/22/2012  . Diabetic nephropathy (HCC) 11/22/2012  . Diabetic ketoacidosis (HCC) 09/20/2012  . Diabetes mellitus type 1 (HCC) 09/20/2012  . Chronic pain 09/20/2012  . Hyperlipidemia 09/20/2012    Past Surgical History:  Procedure Laterality Date  . INCISION AND DRAINAGE    . MOUTH SURGERY         History reviewed. No pertinent family history.  Social History   Tobacco Use  . Smoking status: Current Every Day Smoker    Packs/day: 1.00    Years: 17.00    Pack years: 17.00    Types: Cigarettes  . Smokeless tobacco: Current User  Substance Use Topics  . Alcohol use: Yes    Comment: occasionally   . Drug use: Not Currently    Types: Methamphetamines, Heroin    Comment: recovering from heroin-     Home Medications Prior to Admission medications   Medication Sig Start Date End Date Taking? Authorizing Provider  atorvastatin (LIPITOR) 10 MG tablet Take 10 mg by mouth daily.    [provider]  cyclobenzaprine (FLEXERIL) 10 MG tablet Take 10 mg by mouth 3 (three) times daily as needed for muscle spasms.    [provider]  diclofenac (VOLTAREN) 50 MG EC tablet Take 50 mg by mouth 2 (two) times daily.    [provider]  enoxaparin (LOVENOX) 40 MG/0.4ML injection Inject 0.4 mLs (40 mg total) into the skin daily. For prevention  of blood clot 01/04/15   Nwoko, Nicole Kindred I, NP  insulin aspart (NOVOLOG) 100 UNIT/ML injection Inject 14 Units into the skin 3 (three) times daily with meals. For diabetes managment 01/04/15   Armandina Stammer I, NP  insulin detemir (LEVEMIR) 100 UNIT/ML injection Inject 0.29 mLs (29 Units total) into the skin at bedtime. For diabetes management 01/04/15   Armandina Stammer I, NP  insulin glargine (LANTUS) 100 UNIT/ML injection Inject into the skin daily. Pt on sliding scale    [provider]  lisdexamfetamine (VYVANSE) 60 MG capsule Take 60 mg by mouth every morning.    [provider]    mirtazapine (REMERON SOL-TAB) 15 MG disintegrating tablet Take 1 tablet (15 mg total) by mouth at bedtime. For depression/sleep 01/04/15   Armandina Stammer I, NP  varenicline (CHANTIX) 0.5 MG tablet Take 0.5 mg by mouth 2 (two) times daily.    [provider]    Allergies    Trazodone and nefazodone  Review of Systems   Review of Systems  Cardiovascular: Positive for near-syncope.  Neurological: Positive for dizziness.  All other systems reviewed and are negative.   Physical Exam Updated Vital Signs BP 133/88   Pulse 78   Temp 97.8 F (36.6 C) (Oral)   Resp 18   SpO2 100%   Physical Exam Vitals and nursing note reviewed.  HENT:     Head: Normocephalic.     Mouth/Throat:     Mouth: Mucous membranes are dry.     Comments: MM dry  Eyes:     Extraocular Movements: Extraocular movements intact.     Pupils: Pupils are equal, round, and reactive to light.  Cardiovascular:     Rate and Rhythm: Normal rate and regular rhythm.     Pulses: Normal pulses.     Heart sounds: Normal heart sounds.  Pulmonary:     Effort: Pulmonary effort is normal.     Breath sounds: Normal breath sounds.  Abdominal:     General: Abdomen is flat.     Palpations: Abdomen is soft.  Musculoskeletal:        General: Normal range of motion.     Cervical back: Normal range of motion.  Skin:    General: Skin is warm.     Capillary Refill: Capillary refill takes less than 2 seconds.  Neurological:     General: No focal deficit present.     Mental Status: He is alert and oriented to person, place, and time.  Psychiatric:        Mood and Affect: Mood normal.        Behavior: Behavior normal.     ED Results / Procedures / Treatments   Labs (all labs ordered are listed, but only abnormal results are displayed) Labs Reviewed  CBC WITH DIFFERENTIAL/PLATELET - Abnormal; Notable for the following components:      Result Value   WBC 14.3 (*)    Hemoglobin 12.4 (*)    HCT 37.7 (*)    Neutro Abs  10.7 (*)    Abs Immature Granulocytes 0.08 (*)    All other components within normal limits  CBG MONITORING, ED - Abnormal; Notable for the following components:   Glucose-Capillary 184 (*)    All other components within normal limits  COMPREHENSIVE METABOLIC PANEL  CK    EKG None  Radiology No results found.  Procedures Procedures (including critical care time)  Medications Ordered in ED Medications  sodium chloride 0.9 % bolus 1,000 mL (1,000  mLs Intravenous New Bag/Given 05/23/20 2218)    ED Course  I have reviewed the triage vital signs and the nursing notes.  Pertinent labs & imaging results that were available during my care of the patient were reviewed by me and considered in my medical decision making (see chart for details).    MDM Rules/Calculators/A&P                          KIP CROPP is a 39 y.o. male presenting with dizziness and near syncope. Patient appears dehydrated and also has leg cramps. Will check CBC, CMP, CK levels. Will hydrate and reassess.  12:03 AM Labs showed renal failure with creatinine of 2.8. His urinalysis showed no proteins or ketones and his renal ultrasound showed no Hydro. Patient was hydrated in the ED. Patient is on diclofenac daily. I told him to stop taking it for now. He has PCP follow-up and wants to go home. Told him to avoid any NSAIDs and stay hydrated.  Final Clinical Impression(s) / ED Diagnoses Final diagnoses:  None    Rx / DC Orders ED Discharge Orders    None       Charlynne Pander, MD 05/24/20 0004

## 2020-05-24 NOTE — Discharge Instructions (Signed)
You are dehydrated and your kidney function is abnormal.  Please avoid taking diclofenac or any NSAIDs.  See your doctor for follow-up.  You need a repeat kidney function tests in a week.  Return to ER if you have worse dizziness, passing out, abdominal pain, vomiting, fever.

## 2022-03-12 ENCOUNTER — Emergency Department (HOSPITAL_COMMUNITY)
Admission: EM | Admit: 2022-03-12 | Discharge: 2022-03-12 | Disposition: A | Discharge status: Home / Self Care | Payer: 59 | Attending: Emergency Medicine | Admitting: Emergency Medicine

## 2022-03-12 ENCOUNTER — Other Ambulatory Visit: Payer: Self-pay

## 2022-03-12 DIAGNOSIS — E1065 Type 1 diabetes mellitus with hyperglycemia: Secondary | ICD-10-CM | POA: Diagnosis not present

## 2022-03-12 DIAGNOSIS — F19959 Other psychoactive substance use, unspecified with psychoactive substance-induced psychotic disorder, unspecified: Secondary | ICD-10-CM | POA: Diagnosis not present

## 2022-03-12 DIAGNOSIS — R443 Hallucinations, unspecified: Secondary | ICD-10-CM | POA: Diagnosis present

## 2022-03-12 DIAGNOSIS — E1040 Type 1 diabetes mellitus with diabetic neuropathy, unspecified: Secondary | ICD-10-CM | POA: Insufficient documentation

## 2022-03-12 DIAGNOSIS — Z794 Long term (current) use of insulin: Secondary | ICD-10-CM | POA: Insufficient documentation

## 2022-03-12 LAB — COMPREHENSIVE METABOLIC PANEL
ALT: 40 U/L (ref 0–44)
AST: 37 U/L (ref 15–41)
Albumin: 4.3 g/dL (ref 3.5–5.0)
Alkaline Phosphatase: 104 U/L (ref 38–126)
Anion gap: 6 (ref 5–15)
BUN: 15 mg/dL (ref 6–20)
CO2: 24 mmol/L (ref 22–32)
Calcium: 9.2 mg/dL (ref 8.9–10.3)
Chloride: 107 mmol/L (ref 98–111)
Creatinine, Ser: 1.03 mg/dL (ref 0.61–1.24)
GFR, Estimated: >60 mL/min (ref >60)
Glucose, Bld: 147 mg/dL — ABNORMAL HIGH (ref 70–99)
Potassium: 3.7 mmol/L (ref 3.5–5.1)
Sodium: 137 mmol/L (ref 135–145)
Total Bilirubin: 0.6 mg/dL (ref 0.3–1.2)
Total Protein: 8 g/dL (ref 6.5–8.1)

## 2022-03-12 LAB — URINALYSIS, ROUTINE W REFLEX MICROSCOPIC
Bacteria, UA: NONE SEEN
Bilirubin Urine: NEGATIVE
Glucose, UA: >=500 mg/dL — AB
Hgb urine dipstick: NEGATIVE
Ketones, ur: 5 mg/dL — AB
Leukocytes,Ua: NEGATIVE
Nitrite: NEGATIVE
Protein, ur: 100 mg/dL — AB
Specific Gravity, Urine: 1.029 (ref 1.005–1.030)
pH: 6 (ref 5.0–8.0)

## 2022-03-12 LAB — CBC WITH DIFFERENTIAL/PLATELET
Abs Immature Granulocytes: 0.04 10*3/uL (ref 0.00–0.07)
Basophils Absolute: 0.1 10*3/uL (ref 0.0–0.1)
Basophils Relative: 0 %
Eosinophils Absolute: 0.1 10*3/uL (ref 0.0–0.5)
Eosinophils Relative: 1 %
HCT: 35.4 % — ABNORMAL LOW (ref 39.0–52.0)
Hemoglobin: 11.7 g/dL — ABNORMAL LOW (ref 13.0–17.0)
Immature Granulocytes: 0 %
Lymphocytes Relative: 13 %
Lymphs Abs: 1.6 10*3/uL (ref 0.7–4.0)
MCH: 29.2 pg (ref 26.0–34.0)
MCHC: 33.1 g/dL (ref 30.0–36.0)
MCV: 88.3 fL (ref 80.0–100.0)
Monocytes Absolute: 0.8 10*3/uL (ref 0.1–1.0)
Monocytes Relative: 7 %
Neutro Abs: 9.5 10*3/uL — ABNORMAL HIGH (ref 1.7–7.7)
Neutrophils Relative %: 79 %
Platelets: 339 10*3/uL (ref 150–400)
RBC: 4.01 MIL/uL — ABNORMAL LOW (ref 4.22–5.81)
RDW: 13.3 % (ref 11.5–15.5)
WBC: 12.1 10*3/uL — ABNORMAL HIGH (ref 4.0–10.5)
nRBC: 0 % (ref 0.0–0.2)

## 2022-03-12 LAB — RAPID URINE DRUG SCREEN, HOSP PERFORMED
Amphetamines: POSITIVE — AB
Barbiturates: NOT DETECTED
Benzodiazepines: NOT DETECTED
Cocaine: NOT DETECTED
Opiates: NOT DETECTED
Tetrahydrocannabinol: POSITIVE — AB

## 2022-03-12 LAB — ETHANOL: Alcohol, Ethyl (B): <10 mg/dL (ref <10)

## 2022-03-12 NOTE — ED Notes (Signed)
Patient denies SI, HI, and any hallucinations.  ?

## 2022-03-12 NOTE — ED Notes (Signed)
Phlebotomy drawing labs at this time 

## 2022-03-12 NOTE — ED Triage Notes (Signed)
Pt states at 4am today he thought he heard and saw people in the house; pt is rambling on during triage and unable to focus on the questions being asked ?

## 2022-03-12 NOTE — ED Notes (Signed)
Patient educated on need for urine sample.

## 2022-03-12 NOTE — ED Provider Notes (Signed)
?[place] ?Provider Note ? ? ?CSN: [id number] ?Arrival date & time: 03/12/22  1136 ? ?  ? ?History ? ?Chief Complaint  ?Patient presents with  ? Hallucinations  ? ? ?Tyler Mann is a 41 y.o. male. ? ?Pt is a 41 yo male with a pmhx significant for neuropathy, polysubstance abuse, type 1 dm, and bipolar d/o.  Pt said he used marijuana early this morning.  He said he came home and thought he was seeing and hearing people.  He said he saw shadows that looked like people, but were not people.  The pt said he thought his wife was playing tricks on him initially.  He then thought the people went outside and went after them and took pictures and videos of the people he thought were there.  He said he feels like he's back to normal now and reviewed those pictures and knows they were not people.  He denies si/hi. ? ? ?  ? ?Home Medications ?Prior to Admission medications   ?Medication Sig Start Date End Date Taking? Authorizing Provider  ?cyclobenzaprine (FLEXERIL) 10 MG tablet Take 10 mg by mouth 3 (three) times daily as needed for muscle spasms.   Yes [provider]  ?diclofenac (VOLTAREN) 50 MG EC tablet Take 50 mg by mouth 2 (two) times daily.   Yes [provider]  ?gabapentin (NEURONTIN) 600 MG tablet Take 600 mg by mouth 2 (two) times daily. 12/22/21  Yes [provider]  ?ibuprofen (ADVIL) 800 MG tablet Take 800 mg by mouth every 8 (eight) hours as needed for fever, headache or mild pain.   Yes [provider]  ?Insulin Human (INSULIN PUMP) SOLN Inject 1 each into the skin every 4 (four) hours.   Yes [provider]  ?lamoTRIgine (LAMICTAL) 200 MG tablet Take 1 tablet by mouth daily. 02/19/22  Yes [provider]  ?levocetirizine (XYZAL) 5 MG tablet Take 5 mg by mouth at bedtime. 02/19/22  Yes [provider]  ?lisdexamfetamine (VYVANSE) 60 MG capsule Take 70 mg by mouth every morning.   Yes [provider]  ?enoxaparin  (LOVENOX) 40 MG/0.4ML injection Inject 0.4 mLs (40 mg total) into the skin daily. For prevention of blood clot ?Patient not taking: Reported on 03/12/2022 01/04/15   Lindell Spar I, NP  ?insulin aspart (NOVOLOG) 100 UNIT/ML injection Inject 14 Units into the skin 3 (three) times daily with meals. For diabetes managment ?Patient not taking: Reported on 03/12/2022 01/04/15   Lindell Spar I, NP  ?insulin detemir (LEVEMIR) 100 UNIT/ML injection Inject 0.29 mLs (29 Units total) into the skin at bedtime. For diabetes management ?Patient not taking: Reported on 03/12/2022 01/04/15   Lindell Spar I, NP  ?mirtazapine (REMERON SOL-TAB) 15 MG disintegrating tablet Take 1 tablet (15 mg total) by mouth at bedtime. For depression/sleep ?Patient not taking: Reported on 03/12/2022 01/04/15   Lindell Spar I, NP  ?   ? ?Allergies    ?Trazodone and nefazodone   ? ?Review of Systems   ?Review of Systems  ?Psychiatric/Behavioral:    ?     Hallucinations  ?All other systems reviewed and are negative. ? ?Physical Exam ?Updated Vital Signs ?BP (!) 152/84   Pulse (!) 101   Temp 98.2 ?F (36.8 ?C) (Oral)   Resp 18   SpO2 100%  ?Physical Exam ?Vitals and nursing note reviewed.  ?Constitutional:   ?   Appearance: Normal appearance.  ?HENT:  ?   Head: Normocephalic and atraumatic.  ?  Right Ear: External ear normal.  ?   Left Ear: External ear normal.  ?   Nose: Nose normal.  ?   Mouth/Throat:  ?   Mouth: Mucous membranes are moist.  ?   Pharynx: Oropharynx is clear.  ?Eyes:  ?   Extraocular Movements: Extraocular movements intact.  ?   Conjunctiva/sclera: Conjunctivae normal.  ?   Pupils: Pupils are equal, round, and reactive to light.  ?Cardiovascular:  ?   Rate and Rhythm: Normal rate and regular rhythm.  ?   Pulses: Normal pulses.  ?   Heart sounds: Normal heart sounds.  ?Pulmonary:  ?   Effort: Pulmonary effort is normal.  ?   Breath sounds: Normal breath sounds.  ?Abdominal:  ?   General: Abdomen is flat. Bowel sounds are normal.  ?    Palpations: Abdomen is soft.  ?Musculoskeletal:     ?   General: Normal range of motion.  ?   Cervical back: Normal range of motion and neck supple.  ?Skin: ?   General: Skin is warm.  ?   Capillary Refill: Capillary refill takes less than 2 seconds.  ?Neurological:  ?   General: No focal deficit present.  ?   Mental Status: He is alert and oriented to person, place, and time.  ?Psychiatric:     ?   Mood and Affect: Mood normal.     ?   Behavior: Behavior normal.     ?   Thought Content: Thought content normal.  ? ? ?ED Results / Procedures / Treatments   ?Labs ?(all labs ordered are listed, but only abnormal results are displayed) ?Labs Reviewed  ?COMPREHENSIVE METABOLIC PANEL - Abnormal; Notable for the following components:  ?    Result Value  ? Glucose, Bld 147 (*)   ? All other components within normal limits  ?RAPID URINE DRUG SCREEN, HOSP PERFORMED - Abnormal; Notable for the following components:  ? Amphetamines POSITIVE (*)   ? Tetrahydrocannabinol POSITIVE (*)   ? All other components within normal limits  ?CBC WITH DIFFERENTIAL/PLATELET - Abnormal; Notable for the following components:  ? WBC 12.1 (*)   ? RBC 4.01 (*)   ? Hemoglobin 11.7 (*)   ? HCT 35.4 (*)   ? Neutro Abs 9.5 (*)   ? All other components within normal limits  ?URINALYSIS, ROUTINE W REFLEX MICROSCOPIC - Abnormal; Notable for the following components:  ? Glucose, UA >=500 (*)   ? Ketones, ur 5 (*)   ? Protein, ur 100 (*)   ? All other components within normal limits  ?ETHANOL  ? ? ?EKG ?None ? ?Radiology ?No results found. ? ?Procedures ?Procedures  ? ? ?Medications Ordered in ED ?Medications - No data to display ? ?ED Course/ Medical Decision Making/ A&P ?  ?                        ?Medical Decision Making ?Amount and/or Complexity of Data Reviewed ?Labs: ordered. ? ? ?This patient presents to the ED for concern of hallucinations, this involves an extensive number of treatment options, and is a complaint that carries with it a high risk  of complications and morbidity.  The differential diagnosis includes substance-induced, medication induced, psychosis ? ? ?Co morbidities that complicate the patient evaluation ? ?neuropathy, polysubstance abuse, type 1 dm, and bipolar d/o ? ? ?Additional history obtained: ? ?Additional history obtained from epic chart review ? ? ? ?Lab Tests: ? ?I Ordered,  and personally interpreted labs.  The pertinent results include:  cbc with hbg 12 and h/h 09/25/34.4; cmp nl other than slight elevation of glucose 147; uds + for mj and amphetamines ? ? ?Problem List / ED Course: ? ?Psychosis:  this is likely amphetamine induced.  Pt is back to his normal mental status.  He is not suicidal or homicidal.  Pt does not want a TTS eval, and I don't think he has to have one today.  Pt will need outpatient f/u for substance abuse.  He is stable for d/c.  Return if worse.   ? ? ?Reevaluation: ? ?After the interventions noted above, I reevaluated the patient and found that they have :improved ? ? ?Social Determinants of Health: ? ?Lives at home ? ? ?Dispostion: ? ?After consideration of the diagnostic results and the patients response to treatment, I feel that the patent would benefit from discharge with outpatient f/u.   ? ? ? ? ? ? ? ?Final Clinical Impression(s) / ED Diagnoses ?Final diagnoses:  ?Psychoactive substance-induced psychosis ([place])  ? ? ?Rx / DC Orders ?ED Discharge Orders   ? ? None  ? ?  ? ? ?  ?Isla Pence, MD ?03/12/22 1546 ? ?

## 2022-04-17 ENCOUNTER — Other Ambulatory Visit: Payer: Self-pay

## 2022-04-17 ENCOUNTER — Encounter (HOSPITAL_COMMUNITY): Payer: Self-pay

## 2022-04-17 DIAGNOSIS — E1065 Type 1 diabetes mellitus with hyperglycemia: Secondary | ICD-10-CM | POA: Insufficient documentation

## 2022-04-17 DIAGNOSIS — R739 Hyperglycemia, unspecified: Secondary | ICD-10-CM | POA: Diagnosis present

## 2022-04-17 DIAGNOSIS — F17210 Nicotine dependence, cigarettes, uncomplicated: Secondary | ICD-10-CM | POA: Insufficient documentation

## 2022-04-17 LAB — CBC
HCT: 44.2 % (ref 39.0–52.0)
Hemoglobin: 14.1 g/dL (ref 13.0–17.0)
MCH: 29.5 pg (ref 26.0–34.0)
MCHC: 31.9 g/dL (ref 30.0–36.0)
MCV: 92.5 fL (ref 80.0–100.0)
Platelets: 485 10*3/uL — ABNORMAL HIGH (ref 150–400)
RBC: 4.78 MIL/uL (ref 4.22–5.81)
RDW: 13.4 % (ref 11.5–15.5)
WBC: 10 10*3/uL (ref 4.0–10.5)
nRBC: 0 % (ref 0.0–0.2)

## 2022-04-17 LAB — URINALYSIS, ROUTINE W REFLEX MICROSCOPIC
Bacteria, UA: NONE SEEN
Bilirubin Urine: NEGATIVE
Glucose, UA: >=500 mg/dL — AB
Hgb urine dipstick: NEGATIVE
Ketones, ur: NEGATIVE mg/dL
Leukocytes,Ua: NEGATIVE
Nitrite: NEGATIVE
Protein, ur: NEGATIVE mg/dL
Specific Gravity, Urine: 1.026 (ref 1.005–1.030)
pH: 7 (ref 5.0–8.0)

## 2022-04-17 LAB — BASIC METABOLIC PANEL
Anion gap: 10 (ref 5–15)
BUN: 31 mg/dL — ABNORMAL HIGH (ref 6–20)
CO2: 25 mmol/L (ref 22–32)
Calcium: 9.9 mg/dL (ref 8.9–10.3)
Chloride: 99 mmol/L (ref 98–111)
Creatinine, Ser: 1.04 mg/dL (ref 0.61–1.24)
GFR, Estimated: >60 mL/min (ref >60)
Glucose, Bld: 391 mg/dL — ABNORMAL HIGH (ref 70–99)
Potassium: 4.1 mmol/L (ref 3.5–5.1)
Sodium: 134 mmol/L — ABNORMAL LOW (ref 135–145)

## 2022-04-17 LAB — CBG MONITORING, ED: Glucose-Capillary: 428 mg/dL — ABNORMAL HIGH (ref 70–99)

## 2022-04-17 NOTE — ED Triage Notes (Signed)
Pt c/o hyperglycemia for two weeks. Pt states he has been having issues with insurance trying to get his Omnipod pods.   Pt states "my insurance wont pay for the supplies I need buy but they cant refuse to pay if I come here, so i'll be up here every time I have blood sugar problems"   CBG in triage- 428

## 2022-04-18 ENCOUNTER — Emergency Department (HOSPITAL_COMMUNITY)
Admission: EM | Admit: 2022-04-18 | Discharge: 2022-04-18 | Disposition: A | Discharge status: Home / Self Care | Payer: No Typology Code available for payment source | Attending: Emergency Medicine | Admitting: Emergency Medicine

## 2022-04-18 DIAGNOSIS — R739 Hyperglycemia, unspecified: Secondary | ICD-10-CM

## 2022-04-18 LAB — CBG MONITORING, ED: Glucose-Capillary: 153 mg/dL — ABNORMAL HIGH (ref 70–99)

## 2022-04-18 NOTE — Discharge Instructions (Signed)
You were evaluated in the Emergency Department and after careful evaluation, we did not find any emergent condition requiring admission or further testing in the hospital.  Your exam/testing today was overall reassuring.  Recommend that you call our case manager/social work team to see if they can help you with your insulin pump issue.  The number is [phone], Darl Pikes is the manager working tomorrow.  Please return to the Emergency Department if you experience any worsening of your condition.  Thank you for allowing [place] to be a part of your care.

## 2022-04-18 NOTE — ED Provider Notes (Signed)
[facility]-EMERGENCY DEPT [facility] Emergency Department Provider Note MRN:  [id number]  Arrival date & time: 04/18/22     Chief Complaint   Hyperglycemia History of Present Illness   Tyler Mann is a 41 y.o. year-old male with a history of diabetes presenting to the ED with chief complaint of hyperglycemia.  Patient explains that his insurance has stopped paying for his insulin pump.  He has been trying to fight this with the insurance companies over the phone but it has been very very frustrating for him.  He has been having to inject himself with fast acting insulin every 2 hours and this is not convenient.  Had a very high blood sugar today in the 400s, here for evaluation.  Feels frustrated, wore out.  Denies fever, no pain.  Review of Systems  A thorough review of systems was obtained and all systems are negative except as noted in the HPI and PMH.   Patient's Health History    Past Medical History:  Diagnosis Date   ADD (attention deficit disorder)    Bipolar disorder (HCC)    Chronic back pain    Diabetes mellitus    type 1   Narcotic addiction (HCC)    Hx IV heroin abuse   Neuropathy     Past Surgical History:  Procedure Laterality Date   INCISION AND DRAINAGE     MOUTH SURGERY      No family history on file.  Social History   Socioeconomic History   Marital status: Married    Spouse name: Not on file   Number of children: Not on file   Years of education: Not on file   Highest education level: Not on file  Occupational History   Not on file  Tobacco Use   Smoking status: Every Day    Packs/day: 1.00    Years: 17.00    Pack years: 17.00    Types: Cigarettes   Smokeless tobacco: Current  Substance and Sexual Activity   Alcohol use: Yes    Comment: occasionally    Drug use: Not Currently    Types: Methamphetamines, Heroin    Comment: recovering from heroin-    Sexual activity: Yes  Other Topics Concern   Not on file  Social History Narrative    Not on file   Social Determinants of Health   Financial Resource Strain: Not on file  Food Insecurity: Not on file  Transportation Needs: Not on file  Physical Activity: Not on file  Stress: Not on file  Social Connections: Not on file  Intimate Partner Violence: Not on file     Physical Exam   Vitals:   04/18/22 0115 04/18/22 0130  BP: (!) 132/97 105/76  Pulse: 84 80  Resp:    Temp:    SpO2: 100% 100%    CONSTITUTIONAL: Well-appearing, NAD NEURO/PSYCH:  Alert and oriented x 3, no focal deficits EYES:  eyes equal and reactive ENT/NECK:  no LAD, no JVD CARDIO: Regular rate, well-perfused, normal S1 and S2 PULM:  CTAB no wheezing or rhonchi GI/GU:  non-distended, non-tender MSK/SPINE:  No gross deformities, no edema SKIN:  no rash, atraumatic   *Additional and/or pertinent findings included in MDM below  Diagnostic and Interventional Summary    EKG Interpretation  Date/Time:    Ventricular Rate:    PR Interval:    QRS Duration:   QT Interval:    QTC Calculation:   R Axis:     Text Interpretation:  Labs Reviewed  BASIC METABOLIC PANEL - Abnormal; Notable for the following components:      Result Value   Sodium 134 (*)    Glucose, Bld 391 (*)    BUN 31 (*)    All other components within normal limits  CBC - Abnormal; Notable for the following components:   Platelets 485 (*)    All other components within normal limits  URINALYSIS, ROUTINE W REFLEX MICROSCOPIC - Abnormal; Notable for the following components:   Color, Urine STRAW (*)    Glucose, UA >=500 (*)    All other components within normal limits  CBG MONITORING, ED - Abnormal; Notable for the following components:   Glucose-Capillary 428 (*)    All other components within normal limits  CBG MONITORING, ED - Abnormal; Notable for the following components:   Glucose-Capillary 153 (*)    All other components within normal limits  CBG MONITORING, ED    No orders to display     Medications - No data to display   Procedures  /  Critical Care Procedures  ED Course and Medical Decision Making  Initial Impression and Ddx Patient mostly here to get help with his insurance issue, no longer able to use an insulin pump, which he has been using for 5 years.  This does sound like a very frustrating problem for him.  Vital signs are normal, no pain, well-appearing.  Initial laboratory testing showing a glucose greater than 400 but no signs of DKA.  Repeat blood glucose is 150.  Discussed options, patient will call the case managers in the morning to see if they can help.  Past medical/surgical history that increases complexity of ED encounter: Type 1 diabetes  Interpretation of Diagnostics I personally reviewed the laboratory assessment and my interpretation is as follows: See above      Patient Reassessment and Ultimate Disposition/Management Discharge home  Patient management required discussion with the following services or consulting groups:  None  Complexity of Problems Addressed Acute illness or injury that poses threat of life of bodily function  Additional Data Reviewed and Analyzed Further history obtained from: Further history from spouse/family member  Additional Factors Impacting ED Encounter Risk None  [name]. Pilar Plate, MD [facility] Emergency Medicine [facility] [email or web address]  Final Clinical Impressions(s) / ED Diagnoses     ICD-10-CM   1. Hyperglycemia  R73.9       ED Discharge Orders     None        Discharge Instructions Discussed with and Provided to Patient:    Discharge Instructions      You were evaluated in the Emergency Department and after careful evaluation, we did not find any emergent condition requiring admission or further testing in the hospital.  Your exam/testing today was overall reassuring.  Recommend that you call our case manager/social work team to see if they can help you with your  insulin pump issue.  The number is [phone], Darl Pikes is the manager working tomorrow.  Please return to the Emergency Department if you experience any worsening of your condition.  Thank you for allowing [place] to be a part of your care.       Sabas Sous, MD 04/18/22 [phone]

## 2022-05-14 ENCOUNTER — Other Ambulatory Visit: Payer: Self-pay

## 2022-05-14 ENCOUNTER — Inpatient Hospital Stay (HOSPITAL_COMMUNITY)
Admission: EM | Admit: 2022-05-14 | Discharge: 2022-05-18 | DRG: 683 | Disposition: A | Discharge status: Home / Self Care | Payer: No Typology Code available for payment source | Attending: Internal Medicine | Admitting: Internal Medicine

## 2022-05-14 ENCOUNTER — Encounter (HOSPITAL_COMMUNITY): Payer: Self-pay

## 2022-05-14 DIAGNOSIS — F17210 Nicotine dependence, cigarettes, uncomplicated: Secondary | ICD-10-CM | POA: Diagnosis present

## 2022-05-14 DIAGNOSIS — Z79899 Other long term (current) drug therapy: Secondary | ICD-10-CM

## 2022-05-14 DIAGNOSIS — N179 Acute kidney failure, unspecified: Principal | ICD-10-CM | POA: Diagnosis present

## 2022-05-14 DIAGNOSIS — F411 Generalized anxiety disorder: Secondary | ICD-10-CM | POA: Diagnosis present

## 2022-05-14 DIAGNOSIS — E1042 Type 1 diabetes mellitus with diabetic polyneuropathy: Secondary | ICD-10-CM | POA: Diagnosis present

## 2022-05-14 DIAGNOSIS — F1910 Other psychoactive substance abuse, uncomplicated: Secondary | ICD-10-CM

## 2022-05-14 DIAGNOSIS — E10649 Type 1 diabetes mellitus with hypoglycemia without coma: Secondary | ICD-10-CM | POA: Diagnosis present

## 2022-05-14 DIAGNOSIS — Z20822 Contact with and (suspected) exposure to covid-19: Secondary | ICD-10-CM | POA: Diagnosis present

## 2022-05-14 DIAGNOSIS — F22 Delusional disorders: Secondary | ICD-10-CM

## 2022-05-14 DIAGNOSIS — F319 Bipolar disorder, unspecified: Secondary | ICD-10-CM | POA: Diagnosis present

## 2022-05-14 DIAGNOSIS — F15151 Other stimulant abuse with stimulant-induced psychotic disorder with hallucinations: Secondary | ICD-10-CM | POA: Diagnosis present

## 2022-05-14 DIAGNOSIS — D72828 Other elevated white blood cell count: Secondary | ICD-10-CM | POA: Diagnosis present

## 2022-05-14 DIAGNOSIS — E109 Type 1 diabetes mellitus without complications: Secondary | ICD-10-CM | POA: Diagnosis present

## 2022-05-14 DIAGNOSIS — F14151 Cocaine abuse with cocaine-induced psychotic disorder with hallucinations: Secondary | ICD-10-CM | POA: Diagnosis present

## 2022-05-14 DIAGNOSIS — F332 Major depressive disorder, recurrent severe without psychotic features: Secondary | ICD-10-CM | POA: Diagnosis present

## 2022-05-14 DIAGNOSIS — E1010 Type 1 diabetes mellitus with ketoacidosis without coma: Secondary | ICD-10-CM

## 2022-05-14 DIAGNOSIS — Z794 Long term (current) use of insulin: Secondary | ICD-10-CM

## 2022-05-14 DIAGNOSIS — E1065 Type 1 diabetes mellitus with hyperglycemia: Secondary | ICD-10-CM | POA: Diagnosis present

## 2022-05-14 DIAGNOSIS — Z9641 Presence of insulin pump (external) (internal): Secondary | ICD-10-CM | POA: Diagnosis present

## 2022-05-14 DIAGNOSIS — M6282 Rhabdomyolysis: Secondary | ICD-10-CM | POA: Diagnosis present

## 2022-05-14 LAB — COMPREHENSIVE METABOLIC PANEL
ALT: 42 U/L (ref 0–44)
AST: 48 U/L — ABNORMAL HIGH (ref 15–41)
Albumin: 4.2 g/dL (ref 3.5–5.0)
Alkaline Phosphatase: 124 U/L (ref 38–126)
Anion gap: 10 (ref 5–15)
BUN: 24 mg/dL — ABNORMAL HIGH (ref 6–20)
CO2: 21 mmol/L — ABNORMAL LOW (ref 22–32)
Calcium: 9.5 mg/dL (ref 8.9–10.3)
Chloride: 108 mmol/L (ref 98–111)
Creatinine, Ser: 2.08 mg/dL — ABNORMAL HIGH (ref 0.61–1.24)
GFR, Estimated: 40 mL/min — ABNORMAL LOW (ref >60)
Glucose, Bld: 239 mg/dL — ABNORMAL HIGH (ref 70–99)
Potassium: 3.6 mmol/L (ref 3.5–5.1)
Sodium: 139 mmol/L (ref 135–145)
Total Bilirubin: 1 mg/dL (ref 0.3–1.2)
Total Protein: 7.8 g/dL (ref 6.5–8.1)

## 2022-05-14 LAB — CBC WITH DIFFERENTIAL/PLATELET
Abs Immature Granulocytes: 0.06 10*3/uL (ref 0.00–0.07)
Basophils Absolute: 0.1 10*3/uL (ref 0.0–0.1)
Basophils Relative: 1 %
Eosinophils Absolute: 0 10*3/uL (ref 0.0–0.5)
Eosinophils Relative: 0 %
HCT: 33.8 % — ABNORMAL LOW (ref 39.0–52.0)
Hemoglobin: 11.6 g/dL — ABNORMAL LOW (ref 13.0–17.0)
Immature Granulocytes: 0 %
Lymphocytes Relative: 11 %
Lymphs Abs: 1.9 10*3/uL (ref 0.7–4.0)
MCH: 30.1 pg (ref 26.0–34.0)
MCHC: 34.3 g/dL (ref 30.0–36.0)
MCV: 87.6 fL (ref 80.0–100.0)
Monocytes Absolute: 1.1 10*3/uL — ABNORMAL HIGH (ref 0.1–1.0)
Monocytes Relative: 6 %
Neutro Abs: 14.3 10*3/uL — ABNORMAL HIGH (ref 1.7–7.7)
Neutrophils Relative %: 82 %
Platelets: 364 10*3/uL (ref 150–400)
RBC: 3.86 MIL/uL — ABNORMAL LOW (ref 4.22–5.81)
RDW: 13.4 % (ref 11.5–15.5)
WBC: 17.5 10*3/uL — ABNORMAL HIGH (ref 4.0–10.5)
nRBC: 0 % (ref 0.0–0.2)

## 2022-05-14 LAB — RAPID URINE DRUG SCREEN, HOSP PERFORMED
Amphetamines: POSITIVE — AB
Barbiturates: NOT DETECTED
Benzodiazepines: NOT DETECTED
Cocaine: POSITIVE — AB
Opiates: NOT DETECTED
Tetrahydrocannabinol: NOT DETECTED

## 2022-05-14 LAB — RESP PANEL BY RT-PCR (FLU A&B, COVID) ARPGX2
Influenza A by PCR: NEGATIVE
Influenza B by PCR: NEGATIVE
SARS Coronavirus 2 by RT PCR: NEGATIVE

## 2022-05-14 LAB — CK: Total CK: 1043 U/L — ABNORMAL HIGH (ref 49–397)

## 2022-05-14 LAB — SALICYLATE LEVEL: Salicylate Lvl: <7 mg/dL — ABNORMAL LOW (ref 7.0–30.0)

## 2022-05-14 LAB — ACETAMINOPHEN LEVEL: Acetaminophen (Tylenol), Serum: <10 ug/mL — ABNORMAL LOW (ref 10–30)

## 2022-05-14 LAB — ETHANOL: Alcohol, Ethyl (B): <10 mg/dL (ref <10)

## 2022-05-14 MED ORDER — SODIUM CHLORIDE 0.9 % IV BOLUS
1000.0000 mL | Freq: Once | INTRAVENOUS | Status: AC
  Administered 2022-05-15: 1000 mL via INTRAVENOUS

## 2022-05-14 MED ORDER — LORAZEPAM 1 MG PO TABS
2.0000 mg | ORAL_TABLET | Freq: Once | ORAL | Status: AC
  Administered 2022-05-14: 2 mg via ORAL
  Filled 2022-05-14: qty 2

## 2022-05-14 MED ORDER — DIPHENHYDRAMINE HCL 25 MG PO CAPS
25.0000 mg | ORAL_CAPSULE | Freq: Once | ORAL | Status: AC
  Administered 2022-05-14: 25 mg via ORAL
  Filled 2022-05-14: qty 1

## 2022-05-14 NOTE — ED Provider Notes (Signed)
[facility] EMERGENCY DEPARTMENT Provider Note   CSN: [id number] Arrival date & time: 05/14/22  1910     History  Chief Complaint  Patient presents with   Psychiatric Evaluation    Tyler Mann is a 41 y.o. male.  HPI History of bipolar disorder Patient is a 41 year old male presented emergency room today informs me that he is infested with chiggers.  He states that they took over the city of [place] and states that he has some crawling under his skin.  He endorses cocaine and amphetamine use.  He states that his last methamphetamine use was over 3 weeks ago he is uncertain about last cocaine use.  He is requesting that we strapped him down so that we can see them.  He denies any other recreational drug use besides cocaine and methamphetamine.  Denies any alcohol use at all.  Denies any chest pain difficulty breathing.  He states no nausea or vomiting.  No other associate symptoms.       Home Medications Prior to Admission medications   Medication Sig Start Date End Date Taking? Authorizing Provider  gabapentin (NEURONTIN) 600 MG tablet Take 600 mg by mouth 2 (two) times daily. 12/22/21  Yes [provider]  cyclobenzaprine (FLEXERIL) 10 MG tablet Take 10 mg by mouth 3 (three) times daily as needed for muscle spasms.    [provider]  diclofenac (VOLTAREN) 50 MG EC tablet Take 50 mg by mouth 2 (two) times daily.    [provider]  enoxaparin (LOVENOX) 40 MG/0.4ML injection Inject 0.4 mLs (40 mg total) into the skin daily. For prevention of blood clot Patient not taking: Reported on 03/12/2022 01/04/15   Armandina Stammer I, NP  ibuprofen (ADVIL) 800 MG tablet Take 800 mg by mouth every 8 (eight) hours as needed for fever, headache or mild pain.    [provider]  insulin aspart (NOVOLOG) 100 UNIT/ML injection Inject 14 Units into the skin 3 (three) times daily with meals. For diabetes managment Patient not taking: Reported on  03/12/2022 01/04/15   Armandina Stammer I, NP  insulin detemir (LEVEMIR) 100 UNIT/ML injection Inject 0.29 mLs (29 Units total) into the skin at bedtime. For diabetes management Patient not taking: Reported on 03/12/2022 01/04/15   Armandina Stammer I, NP  Insulin Human (INSULIN PUMP) SOLN Inject 1 each into the skin every 4 (four) hours.    [provider]  lamoTRIgine (LAMICTAL) 200 MG tablet Take 1 tablet by mouth daily. 02/19/22   [provider]  levocetirizine (XYZAL) 5 MG tablet Take 5 mg by mouth at bedtime. 02/19/22   [provider]  lisdexamfetamine (VYVANSE) 60 MG capsule Take 70 mg by mouth every morning.    [provider]  mirtazapine (REMERON SOL-TAB) 15 MG disintegrating tablet Take 1 tablet (15 mg total) by mouth at bedtime. For depression/sleep Patient not taking: Reported on 03/12/2022 01/04/15   Armandina Stammer I, NP      Allergies    Trazodone and nefazodone    Review of Systems   Review of Systems  Physical Exam Updated Vital Signs BP (!) 160/79   Pulse 98   Temp 98.3 F (36.8 C) (Oral)   Resp 16   Ht 6' (1.829 m)   Wt 77.1 kg   SpO2 99%   BMI 23.06 kg/m  Physical Exam Vitals and nursing note reviewed.  HENT:     Head: Normocephalic and atraumatic.     Nose: Nose normal.  Eyes:     General: No scleral icterus. Cardiovascular:     Rate and Rhythm: Normal rate and regular rhythm.     Pulses: Normal pulses.     Heart sounds: Normal heart sounds.  Pulmonary:     Effort: Pulmonary effort is normal. No respiratory distress.     Breath sounds: No wheezing.  Abdominal:     Palpations: Abdomen is soft.     Tenderness: There is no abdominal tenderness. There is no guarding or rebound.  Musculoskeletal:     Cervical back: Normal range of motion.     Right lower leg: No edema.     Left lower leg: No edema.  Skin:    General: Skin is warm and dry.     Capillary Refill: Capillary refill takes less than 2 seconds.  Neurological:     Mental  Status: He is alert. Mental status is at baseline.  Psychiatric:     Comments: Anxious, hyperverbal, pressured speech, state is relatively focused on the idea of parasites and not demonstrating any evidence of depression     ED Results / Procedures / Treatments   Labs (all labs ordered are listed, but only abnormal results are displayed) Labs Reviewed  RAPID URINE DRUG SCREEN, HOSP PERFORMED - Abnormal; Notable for the following components:      Result Value   Cocaine POSITIVE (*)    Amphetamines POSITIVE (*)    All other components within normal limits  COMPREHENSIVE METABOLIC PANEL - Abnormal; Notable for the following components:   CO2 21 (*)    Glucose, Bld 239 (*)    BUN 24 (*)    Creatinine, Ser 2.08 (*)    AST 48 (*)    GFR, Estimated 40 (*)    All other components within normal limits  CBC WITH DIFFERENTIAL/PLATELET - Abnormal; Notable for the following components:   WBC 17.5 (*)    RBC 3.86 (*)    Hemoglobin 11.6 (*)    HCT 33.8 (*)    Neutro Abs 14.3 (*)    Monocytes Absolute 1.1 (*)    All other components within normal limits  ACETAMINOPHEN LEVEL - Abnormal; Notable for the following components:   Acetaminophen (Tylenol), Serum <10 (*)    All other components within normal limits  SALICYLATE LEVEL - Abnormal; Notable for the following components:   Salicylate Lvl <7.0 (*)    All other components within normal limits  CK - Abnormal; Notable for the following components:   Total CK 1,043 (*)    All other components within normal limits  RESP PANEL BY RT-PCR (FLU A&B, COVID) ARPGX2  ETHANOL  URINALYSIS, ROUTINE W REFLEX MICROSCOPIC    EKG None  Radiology No results found.  Procedures Procedures    Medications Ordered in ED Medications  diphenhydrAMINE (BENADRYL) capsule 25 mg (25 mg Oral Given 05/14/22 2200)  LORazepam (ATIVAN) tablet 2 mg (2 mg Oral Given 05/14/22 2200)  sodium chloride 0.9 % bolus 1,000 mL (1,000 mLs Intravenous New Bag/Given  05/15/22 0031)    ED Course/ Medical Decision Making/ A&P                           Medical Decision Making Amount and/or Complexity of Data Reviewed Labs: ordered.  Risk Prescription drug management. Decision regarding hospitalization.   History of bipolar disorder Patient is a 41 year old male presented emergency room today informs me that he is infested with chiggers.  He states that  they took over the city of [place] and states that he has some crawling under his skin.  He endorses cocaine and amphetamine use.  He states that his last methamphetamine use was over 3 weeks ago he is uncertain about last cocaine use.  He is requesting that we strapped him down so that we can see them.  He denies any other recreational drug use besides cocaine and methamphetamine.  Denies any alcohol use at all.  Denies any chest pain difficulty breathing.  He states no nausea or vomiting.  No other associate symptoms.   Physical exam unremarkable apart from very agitated mildly tachycardic patient.  CK elevated at 1000, urine drug screen positive for cocaine and amphetamines.  Ethanol undetectable.  CMP with creatinine of 2.08 this is approximately doubled from 4 weeks ago.  Marginal elevation of AST nonspecific leukocytosis and mild anemia COVID influenza negative.  Given dehydration, dark urine, elevated CK and AKI will admit to medicine Dr. Antionette Char will accept patient.  IV hydration ordered.  Will require psychiatric evaluation once he is medically cleared.  Final Clinical Impression(s) / ED Diagnoses Final diagnoses:  AKI (acute kidney injury) (HCC)  Polysubstance abuse (HCC)  Delusions of parasitosis [facility])    Rx / DC Orders ED Discharge Orders     None         Gailen Shelter, [place] 05/15/22 0041    Gerhard Munch, MD 05/16/22 [phone]

## 2022-05-14 NOTE — ED Notes (Signed)
ED Provider at bedside. 

## 2022-05-15 ENCOUNTER — Encounter (HOSPITAL_COMMUNITY): Payer: Self-pay | Admitting: Family Medicine

## 2022-05-15 DIAGNOSIS — D72828 Other elevated white blood cell count: Secondary | ICD-10-CM | POA: Diagnosis present

## 2022-05-15 DIAGNOSIS — F332 Major depressive disorder, recurrent severe without psychotic features: Secondary | ICD-10-CM

## 2022-05-15 DIAGNOSIS — N179 Acute kidney failure, unspecified: Secondary | ICD-10-CM | POA: Diagnosis present

## 2022-05-15 DIAGNOSIS — E1065 Type 1 diabetes mellitus with hyperglycemia: Secondary | ICD-10-CM | POA: Diagnosis present

## 2022-05-15 DIAGNOSIS — F411 Generalized anxiety disorder: Secondary | ICD-10-CM | POA: Diagnosis present

## 2022-05-15 DIAGNOSIS — F1910 Other psychoactive substance abuse, uncomplicated: Secondary | ICD-10-CM | POA: Diagnosis not present

## 2022-05-15 DIAGNOSIS — F15151 Other stimulant abuse with stimulant-induced psychotic disorder with hallucinations: Secondary | ICD-10-CM | POA: Diagnosis present

## 2022-05-15 DIAGNOSIS — E103299 Type 1 diabetes mellitus with mild nonproliferative diabetic retinopathy without macular edema, unspecified eye: Secondary | ICD-10-CM | POA: Diagnosis not present

## 2022-05-15 DIAGNOSIS — F319 Bipolar disorder, unspecified: Secondary | ICD-10-CM | POA: Diagnosis present

## 2022-05-15 DIAGNOSIS — Z79899 Other long term (current) drug therapy: Secondary | ICD-10-CM | POA: Diagnosis not present

## 2022-05-15 DIAGNOSIS — Z20822 Contact with and (suspected) exposure to covid-19: Secondary | ICD-10-CM | POA: Diagnosis present

## 2022-05-15 DIAGNOSIS — F17210 Nicotine dependence, cigarettes, uncomplicated: Secondary | ICD-10-CM | POA: Diagnosis present

## 2022-05-15 DIAGNOSIS — E10649 Type 1 diabetes mellitus with hypoglycemia without coma: Secondary | ICD-10-CM | POA: Diagnosis present

## 2022-05-15 DIAGNOSIS — Z9641 Presence of insulin pump (external) (internal): Secondary | ICD-10-CM | POA: Diagnosis present

## 2022-05-15 DIAGNOSIS — Z794 Long term (current) use of insulin: Secondary | ICD-10-CM | POA: Diagnosis not present

## 2022-05-15 DIAGNOSIS — E1042 Type 1 diabetes mellitus with diabetic polyneuropathy: Secondary | ICD-10-CM | POA: Diagnosis present

## 2022-05-15 DIAGNOSIS — F14151 Cocaine abuse with cocaine-induced psychotic disorder with hallucinations: Secondary | ICD-10-CM | POA: Diagnosis present

## 2022-05-15 DIAGNOSIS — M6282 Rhabdomyolysis: Secondary | ICD-10-CM | POA: Diagnosis present

## 2022-05-15 LAB — CBC
HCT: 32.7 % — ABNORMAL LOW (ref 39.0–52.0)
Hemoglobin: 10.4 g/dL — ABNORMAL LOW (ref 13.0–17.0)
MCH: 28.8 pg (ref 26.0–34.0)
MCHC: 31.8 g/dL (ref 30.0–36.0)
MCV: 90.6 fL (ref 80.0–100.0)
Platelets: 338 10*3/uL (ref 150–400)
RBC: 3.61 MIL/uL — ABNORMAL LOW (ref 4.22–5.81)
RDW: 13.4 % (ref 11.5–15.5)
WBC: 12.7 10*3/uL — ABNORMAL HIGH (ref 4.0–10.5)
nRBC: 0 % (ref 0.0–0.2)

## 2022-05-15 LAB — COMPREHENSIVE METABOLIC PANEL
ALT: 36 U/L (ref 0–44)
AST: 45 U/L — ABNORMAL HIGH (ref 15–41)
Albumin: 3.6 g/dL (ref 3.5–5.0)
Alkaline Phosphatase: 110 U/L (ref 38–126)
Anion gap: 7 (ref 5–15)
BUN: 24 mg/dL — ABNORMAL HIGH (ref 6–20)
CO2: 23 mmol/L (ref 22–32)
Calcium: 8.7 mg/dL — ABNORMAL LOW (ref 8.9–10.3)
Chloride: 107 mmol/L (ref 98–111)
Creatinine, Ser: 1.44 mg/dL — ABNORMAL HIGH (ref 0.61–1.24)
GFR, Estimated: >60 mL/min (ref >60)
Glucose, Bld: 206 mg/dL — ABNORMAL HIGH (ref 70–99)
Potassium: 4.1 mmol/L (ref 3.5–5.1)
Sodium: 137 mmol/L (ref 135–145)
Total Bilirubin: 0.7 mg/dL (ref 0.3–1.2)
Total Protein: 6.8 g/dL (ref 6.5–8.1)

## 2022-05-15 LAB — CBG MONITORING, ED
Glucose-Capillary: 190 mg/dL — ABNORMAL HIGH (ref 70–99)
Glucose-Capillary: 218 mg/dL — ABNORMAL HIGH (ref 70–99)
Glucose-Capillary: 65 mg/dL — ABNORMAL LOW (ref 70–99)
Glucose-Capillary: 51 mg/dL — ABNORMAL LOW (ref 70–99)
Glucose-Capillary: 126 mg/dL — ABNORMAL HIGH (ref 70–99)

## 2022-05-15 LAB — SODIUM, URINE, RANDOM: Sodium, Ur: 34 mmol/L

## 2022-05-15 LAB — URINALYSIS, ROUTINE W REFLEX MICROSCOPIC
Bilirubin Urine: NEGATIVE
Glucose, UA: 150 mg/dL — AB
Ketones, ur: 5 mg/dL — AB
Leukocytes,Ua: NEGATIVE
Nitrite: NEGATIVE
Protein, ur: >=300 mg/dL — AB
Specific Gravity, Urine: 1.027 (ref 1.005–1.030)
pH: 5 (ref 5.0–8.0)

## 2022-05-15 LAB — TSH: TSH: 1.132 u[IU]/mL (ref 0.350–4.500)

## 2022-05-15 LAB — GLUCOSE, CAPILLARY
Glucose-Capillary: 167 mg/dL — ABNORMAL HIGH (ref 70–99)
Glucose-Capillary: 342 mg/dL — ABNORMAL HIGH (ref 70–99)

## 2022-05-15 LAB — CK: Total CK: 1203 U/L — ABNORMAL HIGH (ref 49–397)

## 2022-05-15 LAB — HIV ANTIBODY (ROUTINE TESTING W REFLEX): HIV Screen 4th Generation wRfx: NONREACTIVE

## 2022-05-15 LAB — CREATININE, URINE, RANDOM: Creatinine, Urine: 334.59 mg/dL

## 2022-05-15 MED ORDER — HALOPERIDOL LACTATE 5 MG/ML IJ SOLN
5.0000 mg | Freq: Four times a day (QID) | INTRAMUSCULAR | Status: DC | PRN
  Administered 2022-05-15: 5 mg via INTRAMUSCULAR

## 2022-05-15 MED ORDER — DIPHENHYDRAMINE HCL 50 MG/ML IJ SOLN
50.0000 mg | Freq: Two times a day (BID) | INTRAMUSCULAR | Status: DC | PRN
  Administered 2022-05-15: 50 mg via INTRAMUSCULAR
  Filled 2022-05-15: qty 1

## 2022-05-15 MED ORDER — INSULIN GLARGINE-YFGN 100 UNIT/ML ~~LOC~~ SOLN
15.0000 [IU] | Freq: Every day | SUBCUTANEOUS | Status: DC
  Filled 2022-05-15: qty 0.15

## 2022-05-15 MED ORDER — DIPHENHYDRAMINE HCL 50 MG/ML IJ SOLN: 50.0000 mg | Freq: Four times a day (QID) | INTRAMUSCULAR | Status: DC | PRN

## 2022-05-15 MED ORDER — HALOPERIDOL LACTATE 5 MG/ML IJ SOLN
5.0000 mg | Freq: Two times a day (BID) | INTRAMUSCULAR | Status: DC | PRN
  Filled 2022-05-15: qty 1

## 2022-05-15 MED ORDER — INSULIN ASPART 100 UNIT/ML IJ SOLN
0.0000 [IU] | Freq: Every day | INTRAMUSCULAR | Status: DC
  Administered 2022-05-16: 5 [IU] via SUBCUTANEOUS

## 2022-05-15 MED ORDER — LORAZEPAM 2 MG/ML IJ SOLN: 2.0000 mg | Freq: Two times a day (BID) | INTRAMUSCULAR | Status: DC | PRN

## 2022-05-15 MED ORDER — INSULIN ASPART 100 UNIT/ML IJ SOLN
4.0000 [IU] | Freq: Three times a day (TID) | INTRAMUSCULAR | Status: DC
Start: 2022-05-15 — End: 2022-05-15
  Administered 2022-05-15: 4 [IU] via SUBCUTANEOUS

## 2022-05-15 MED ORDER — HALOPERIDOL 5 MG PO TABS: 5.0000 mg | ORAL_TABLET | Freq: Two times a day (BID) | ORAL | Status: DC | PRN

## 2022-05-15 MED ORDER — SODIUM CHLORIDE 0.9 % IV SOLN: INTRAVENOUS | Status: AC

## 2022-05-15 MED ORDER — LORAZEPAM 2 MG/ML IJ SOLN: 2.0000 mg | Freq: Four times a day (QID) | INTRAMUSCULAR | Status: DC | PRN

## 2022-05-15 MED ORDER — DIPHENHYDRAMINE HCL 50 MG/ML IJ SOLN: 50.0000 mg | Freq: Once | INTRAMUSCULAR | Status: DC

## 2022-05-15 MED ORDER — HALOPERIDOL LACTATE 5 MG/ML IJ SOLN: 5.0000 mg | Freq: Four times a day (QID) | INTRAMUSCULAR | Status: DC | PRN

## 2022-05-15 MED ORDER — INSULIN GLARGINE-YFGN 100 UNIT/ML ~~LOC~~ SOLN
10.0000 [IU] | Freq: Every day | SUBCUTANEOUS | Status: DC
  Administered 2022-05-16: 10 [IU] via SUBCUTANEOUS
  Filled 2022-05-15: qty 0.1

## 2022-05-15 MED ORDER — INSULIN ASPART 100 UNIT/ML IJ SOLN
0.0000 [IU] | Freq: Three times a day (TID) | INTRAMUSCULAR | Status: DC
  Administered 2022-05-15: 2 [IU] via SUBCUTANEOUS
  Administered 2022-05-16: 3 [IU] via SUBCUTANEOUS
  Administered 2022-05-16: 9 [IU] via SUBCUTANEOUS
  Administered 2022-05-17: 5 [IU] via SUBCUTANEOUS
  Administered 2022-05-17 (×2): 4 [IU] via SUBCUTANEOUS
  Administered 2022-05-18: 2 [IU] via SUBCUTANEOUS
  Administered 2022-05-18: 7 [IU] via SUBCUTANEOUS

## 2022-05-15 MED ORDER — SENNOSIDES-DOCUSATE SODIUM 8.6-50 MG PO TABS: 1.0000 | ORAL_TABLET | Freq: Every evening | ORAL | Status: DC | PRN

## 2022-05-15 MED ORDER — HEPARIN SODIUM (PORCINE) 5000 UNIT/ML IJ SOLN
5000.0000 [IU] | Freq: Three times a day (TID) | INTRAMUSCULAR | Status: DC
  Filled 2022-05-15: qty 1

## 2022-05-15 MED ORDER — ACETAMINOPHEN 650 MG RE SUPP: 650.0000 mg | Freq: Four times a day (QID) | RECTAL | Status: DC | PRN

## 2022-05-15 MED ORDER — ACETAMINOPHEN 325 MG PO TABS: 650.0000 mg | ORAL_TABLET | Freq: Four times a day (QID) | ORAL | Status: DC | PRN

## 2022-05-15 MED ORDER — LORAZEPAM 2 MG/ML IJ SOLN
2.0000 mg | Freq: Once | INTRAMUSCULAR | Status: AC
  Administered 2022-05-15: 2 mg via INTRAMUSCULAR

## 2022-05-15 MED ORDER — DEXTROSE 50 % IV SOLN
INTRAVENOUS | Status: AC
  Administered 2022-05-15: 50 mL
  Filled 2022-05-15: qty 50

## 2022-05-15 MED ORDER — LORAZEPAM 2 MG/ML IJ SOLN
1.0000 mg | INTRAMUSCULAR | Status: DC | PRN
Start: 2022-05-15 — End: 2022-05-15
  Administered 2022-05-15: 1 mg via INTRAVENOUS
  Filled 2022-05-15: qty 1

## 2022-05-15 NOTE — ED Notes (Signed)
Patient currently standing in corner of room.  Appears paranoid and states he is hiding.

## 2022-05-15 NOTE — ED Notes (Signed)
Pt is on the floor at this time making a phone call to his father.  He is whispering so that no one will hear him

## 2022-05-15 NOTE — Progress Notes (Signed)
[name] arrived to [phone]. Patient is sleeping. Skin assessed with Erin. Call bell at bedside within reach, and bed placed in lowest position.

## 2022-05-15 NOTE — ED Notes (Signed)
Pt reports bug are gone

## 2022-05-15 NOTE — ED Notes (Signed)
MD aware that the pt pulled out his second IV now this morning

## 2022-05-15 NOTE — Progress Notes (Signed)
Same day note  Patient seen and examined at bedside.  Patient was admitted to the hospital for feeling of crawling in the skin.  Patient has been using methamphetamine and cocaine and hallucinogenic mushrooms.  At the time of my evaluation, patient is somnolent.  Physical examination reveals somnolent after sedation.  Laboratory data and imaging was reviewed  Assessment and Plan.  Acute kidney injury.  Creatinine of 2.0 on admission.  We will continue with IV hydration.  Currently has pulled out IV.  Spoke with the nurse about placing a new IV with IV fluid.  Psychosis, hallucinations, delusions, paranoia and agitation.  Psychiatry on board.  Could be secondary to substance withdrawal intoxication.  The patient has been seen by psychiatry and IVC has been placed.  Required IM Benadryl for sedation including Haldol, IV Ativan.  Polysubstance abuse.  Patient was positive for cocaine and amphetamines.  Alcohol level was less than 10.  Leukocytosis.  Likely reactive.  Check levels in AM.  No signs of infection so far.  Type 1 diabetes.  Check A1c.  Continue long-acting insulin and sliding scale insulin.  Elevated CK . Continue IV fluids.  Could be mild rhabdomyolysis.  No Charge  Signed,  Tenny Craw, MD [facility]

## 2022-05-15 NOTE — Consult Note (Signed)
[facility]iatry New Face-to-Face Psychiatric Evaluation   Service Date: May 15, 2022 LOS:  LOS: 0 days    Assessment  Tyler Mann is a 41 y.o. male admitted medically on 05/14/2022  7:38 PM for AKI. He carries the psychiatric diagnoses of polusbstance abuse (IV opioids, meth, cocaine) and has a past medical history of T1DM. Psychiatry was consulted for hallucinations, delusions, and agitation by Dr. Antionette Char.    Substance induced psychotic disorder On assessment this morning the patient is aggressive, posturing threateningly towards the nurse and this writer, as well as psychotic, yelling, talking about unseen others being in his room. Patient was IVC'd, agitation medication ordered. On further discussion with patient he exhibits severe thought disorganization, talking about "a gallon of apple sauce" and how his wife is paid "75 dollars each month every 3 weeks". Patient has no insight currently. UDS positive for cocaine and amphetamines. Per chart review, the patient was previously admitted for a severe overdose on Xanax and insulin in 2016 requiring intubation and subsequent psychiatric hospitalization. He was using substances at that point but there is no mention of psychosis found at other points in the chart. Collateral from spouse will be helpful. Unable to determine need for psychiatric hospitalization at this point as patient's psychosis may resolve quickly or take time to resolve.     Diagnoses:  Active Hospital problems: Principal Problem:   AKI (acute kidney injury) (HCC) Active Problems:   Diabetes mellitus type 1 (HCC)   Generalized anxiety disorder   Polysubstance abuse (HCC)   MDD (major depressive disorder), recurrent episode, severe (HCC)     Plan  ## Safety and Observation Level:  - Based on my clinical evaluation, I estimate the patient to be at high risk of self harm in the current setting - At this time, we recommend a 1:1 level of observation.  Appropriate for 1:1 to be located outside of patient's room to avoid further agitation and patient's lack of suicidal thoughts or intentional self harm behavior. This decision is based on my review of the chart including patient's history and current presentation, interview of the patient, mental status examination, and consideration of suicide risk including evaluating suicidal ideation, plan, intent, suicidal or self-harm behaviors, risk factors, and protective factors. This judgment is based on our ability to directly address suicide risk, implement suicide prevention strategies and develop a safety plan while the patient is in the clinical setting. Please contact our team if there is a concern that risk level has changed.   ## Medications:  --Haldol 5 mg IV/IM q6hrs PRN for agitation --Benadryl 50 mg IV/IM q6hrs PRN for agitation, dystonia --Ativan 2 mg IV/IM q6hrs PRN for agitation  ## Medical Decision Making Capacity:  --not formally assessed  ## Further Work-up:  --No EKG as of now, patient has been refusing. Is on IV/IM haldol PRN, but no documented Hx of heart disease/arrhythmias.  -- Pertinent labwork reviewed earlier this admission includes:  Cr 2->1.4 CK of 1K -> 1.2K TSH nml HIV negative  ## Disposition:  --Continued medical admission  ##Legal Status INVOLUNTARY  Thank you for this consult request. Recommendations have been communicated to the primary team.  We will follow at this time.   Tyler Reichert, MD   NEW history  Relevant Aspects of Hospital Course:  Admitted on 05/14/2022 for AKI. 6/27 IVC'd, psychotic, aggressive, receieved several PRNs.    Patient Report:  On assessment this morning, the patient is found pacing in the hallways.  He  yells at the nurse complaining that there are people in his room.  The nurse later states that no one has been in to visit the patient and that he is referring to unseen others.  He postures aggressively towards others and  gesticulates wildly.  He is IVC by this Clinical research associate shortly thereafter and orders were placed for as needed agitation medication.  Nursing reports the patient has been talking about larvae in his skin.   On later assessment with the attending physician, further interview was performed. He exhibits severe thought disorganization, talking about "a gallon of apple sauce" and how his wife is paid "75 dollars each month every 3 weeks".  He denies the idea that he has a kidney injury, stating "my kidneys are fine".  He then demands that he be able to take care of his medical conditions on outpatient basis.  It was explained that is not possible.  It was explained that the patient was IVC'd.   The patient also talks about his relationship with his wife.  It is difficult to follow the story he presents.  He talks about his wife and him having financial trouble and about a potential separation, with them considering who will get the dogs.  ROS:  Patient is actively psychotic and unable to give reliable information about psychiatric review of symptoms.  Collateral information:  Patient unable to consent to collateral contact today.  Plan to further discuss with patient tomorrow.  Will discuss medication compliance and history of psychotic believes with the patient's wife.  Psychiatric History:  Information collected from chart review, patient  Family psych history: unable to collect   Social History:   Tobacco use: yes Alcohol use: none reported Drug use: yes, patient ad  Family History:  The patient's family history is not on file.  Medical History: Past Medical History:  Diagnosis Date  . ADD (attention deficit disorder)   . Bipolar disorder (HCC)   . Chronic back pain   . Diabetes mellitus    type 1  . Narcotic addiction (HCC)    Hx IV heroin abuse  . Neuropathy     Surgical History: Past Surgical History:  Procedure Laterality Date  . INCISION AND DRAINAGE    . MOUTH SURGERY       Medications:   Current Facility-Administered Medications:  .  0.9 %  sodium chloride infusion, , Intravenous, Continuous, Opyd, Lavone Neri, MD, Last Rate: 150 mL/hr at 05/15/22 1256, New Bag at 05/15/22 1256 .  acetaminophen (TYLENOL) tablet 650 mg, 650 mg, Oral, Q6H PRN **OR** acetaminophen (TYLENOL) suppository 650 mg, 650 mg, Rectal, Q6H PRN, Opyd, Lavone Neri, MD .  diphenhydrAMINE (BENADRYL) injection 50 mg, 50 mg, Intravenous, Q6H PRN **OR** diphenhydrAMINE (BENADRYL) injection 50 mg, 50 mg, Intramuscular, Q6H PRN, Tyler Reichert, MD .  haloperidol lactate (HALDOL) injection 5 mg, 5 mg, Intravenous, Q6H PRN **OR** haloperidol lactate (HALDOL) injection 5 mg, 5 mg, Intramuscular, Q6H PRN, Tyler Reichert, MD .  heparin injection 5,000 Units, 5,000 Units, Subcutaneous, Q8H, Opyd, Timothy S, MD .  insulin aspart (novoLOG) injection 0-5 Units, 0-5 Units, Subcutaneous, QHS, Opyd, Timothy S, MD .  insulin aspart (novoLOG) injection 0-9 Units, 0-9 Units, Subcutaneous, TID WC, Opyd, Lavone Neri, MD, 2 Units at 05/15/22 0941 .  insulin aspart (novoLOG) injection 4 Units, 4 Units, Subcutaneous, TID WC, Opyd, Lavone Neri, MD, 4 Units at 05/15/22 0941 .  insulin glargine-yfgn (SEMGLEE) injection 15 Units, 15 Units, Subcutaneous, Daily, Opyd, Lavone Neri, MD .  LORazepam (  ATIVAN) injection 2 mg, 2 mg, Intravenous, Q6H PRN **OR** LORazepam (ATIVAN) injection 2 mg, 2 mg, Intramuscular, Q6H PRN, Tyler Reichert, MD .  senna-docusate (Senokot-S) tablet 1 tablet, 1 tablet, Oral, QHS PRN, Opyd, Lavone Neri, MD  Current Outpatient Medications:  .  cyclobenzaprine (FLEXERIL) 10 MG tablet, Take 10 mg by mouth 3 (three) times daily as needed for muscle spasms., Disp: , Rfl:  .  gabapentin (NEURONTIN) 600 MG tablet, Take 600 mg by mouth 2 (two) times daily., Disp: , Rfl:  .  lamoTRIgine (LAMICTAL) 200 MG tablet, Take 200 mg by mouth daily., Disp: , Rfl:  .  levocetirizine (XYZAL) 5 MG tablet, Take 5 mg by mouth at  bedtime., Disp: , Rfl:  .  NOVOLOG 100 UNIT/ML injection, Inject 5-20 Units into the skin 3 (three) times daily., Disp: , Rfl:  .  rosuvastatin (CRESTOR) 5 MG tablet, Take 5 mg by mouth daily., Disp: , Rfl:  .  VYVANSE 70 MG capsule, Take 70 mg by mouth daily., Disp: , Rfl:  .  enoxaparin (LOVENOX) 40 MG/0.4ML injection, Inject 0.4 mLs (40 mg total) into the skin daily. For prevention of blood clot (Patient not taking: Reported on 03/12/2022), Disp: 0.4 mL, Rfl: 0 .  insulin aspart (NOVOLOG) 100 UNIT/ML injection, Inject 14 Units into the skin 3 (three) times daily with meals. For diabetes managment (Patient not taking: Reported on 03/12/2022), Disp: 10 mL, Rfl: 11 .  insulin detemir (LEVEMIR) 100 UNIT/ML injection, Inject 0.29 mLs (29 Units total) into the skin at bedtime. For diabetes management (Patient not taking: Reported on 03/12/2022), Disp: 10 mL, Rfl: 11 .  mirtazapine (REMERON SOL-TAB) 15 MG disintegrating tablet, Take 1 tablet (15 mg total) by mouth at bedtime. For depression/sleep (Patient not taking: Reported on 03/12/2022), Disp: 30 tablet, Rfl: 0  Allergies: Allergies  Allergen Reactions  . Trazodone And Nefazodone Other (See Comments)    Hallucinations and nightmares       Objective  Vital signs:  Temp:  [97.8 F (36.6 C)-98.7 F (37.1 C)] 98.4 F (36.9 C) (06/27 1142) Pulse Rate:  [79-110] 79 (06/27 1142) Resp:  [16-22] 20 (06/27 1142) BP: (118-160)/(71-85) 118/71 (06/27 1142) SpO2:  [98 %-100 %] 98 % (06/27 1142) Weight:  [77.1 kg] 77.1 kg (06/26 1959)  Psychiatric Specialty Exam:  Presentation  General Appearance: Appropriate for Environment Eye Contact:-- (intense) Speech:Clear and Coherent; Pressured Speech Volume:Increased Handedness:No data recorded  Mood and Affect  Mood:Irritable Affect:Congruent; Labile  Thought Process  Thought Processes:Disorganized Descriptions of Associations:Loose  Orientation:Full (Time, Place and Person)  Thought  Content:Scattered; Illogical  History of Schizophrenia/Schizoaffective disorder:No data recorded Duration of Psychotic Symptoms:No data recorded Hallucinations:Hallucinations: None  Ideas of Reference:None  Suicidal Thoughts:Suicidal Thoughts: No  Homicidal Thoughts:Homicidal Thoughts: No   Sensorium  Memory:Immediate Fair; Recent Fair; Remote Fair Judgment:Impaired Insight:None  Executive Functions  Concentration:Poor Attention Span:Poor Recall:Fair Fund of Knowledge:Fair Language:Fair  Psychomotor Activity  Psychomotor Activity:Psychomotor Activity: Increased; Restlessness  Assets  Assets:Social Support  Sleep  Sleep:Sleep: Fair   Physical Exam: Physical Exam Constitutional:      Appearance: the patient is not toxic-appearing.  Pulmonary:     Effort: Pulmonary effort is normal.  Neurological:     General: No focal deficit present.     Mental Status: the patient is alert and oriented to person, place, and time.   Review of Systems  Unable to assess  Blood pressure 118/71, pulse 79, temperature 98.4 F (36.9 C), temperature source Axillary, resp. rate 20, height 6' (  1.829 m), weight 77.1 kg, SpO2 98 %. Body mass index is 23.06 kg/m.   Tyler Reichert, MD PGY-1

## 2022-05-16 DIAGNOSIS — E103299 Type 1 diabetes mellitus with mild nonproliferative diabetic retinopathy without macular edema, unspecified eye: Secondary | ICD-10-CM | POA: Diagnosis not present

## 2022-05-16 DIAGNOSIS — F332 Major depressive disorder, recurrent severe without psychotic features: Secondary | ICD-10-CM | POA: Diagnosis not present

## 2022-05-16 DIAGNOSIS — N179 Acute kidney failure, unspecified: Secondary | ICD-10-CM | POA: Diagnosis not present

## 2022-05-16 DIAGNOSIS — F411 Generalized anxiety disorder: Secondary | ICD-10-CM | POA: Diagnosis not present

## 2022-05-16 LAB — GLUCOSE, CAPILLARY
Glucose-Capillary: 115 mg/dL — ABNORMAL HIGH (ref 70–99)
Glucose-Capillary: 203 mg/dL — ABNORMAL HIGH (ref 70–99)
Glucose-Capillary: 25 mg/dL — CL (ref 70–99)
Glucose-Capillary: 393 mg/dL — ABNORMAL HIGH (ref 70–99)
Glucose-Capillary: 405 mg/dL — ABNORMAL HIGH (ref 70–99)
Glucose-Capillary: 433 mg/dL — ABNORMAL HIGH (ref 70–99)
Glucose-Capillary: 478 mg/dL — ABNORMAL HIGH (ref 70–99)

## 2022-05-16 LAB — COMPREHENSIVE METABOLIC PANEL
ALT: 39 U/L (ref 0–44)
AST: 46 U/L — ABNORMAL HIGH (ref 15–41)
Albumin: 3.3 g/dL — ABNORMAL LOW (ref 3.5–5.0)
Alkaline Phosphatase: 134 U/L — ABNORMAL HIGH (ref 38–126)
Anion gap: 9 (ref 5–15)
BUN: 15 mg/dL (ref 6–20)
CO2: 24 mmol/L (ref 22–32)
Calcium: 9.1 mg/dL (ref 8.9–10.3)
Chloride: 105 mmol/L (ref 98–111)
Creatinine, Ser: 1.11 mg/dL (ref 0.61–1.24)
GFR, Estimated: >60 mL/min (ref >60)
Glucose, Bld: 321 mg/dL — ABNORMAL HIGH (ref 70–99)
Potassium: 5.1 mmol/L (ref 3.5–5.1)
Sodium: 138 mmol/L (ref 135–145)
Total Bilirubin: 1.1 mg/dL (ref 0.3–1.2)
Total Protein: 6.7 g/dL (ref 6.5–8.1)

## 2022-05-16 LAB — CBC
HCT: 35.9 % — ABNORMAL LOW (ref 39.0–52.0)
Hemoglobin: 11.9 g/dL — ABNORMAL LOW (ref 13.0–17.0)
MCH: 30 pg (ref 26.0–34.0)
MCHC: 33.1 g/dL (ref 30.0–36.0)
MCV: 90.4 fL (ref 80.0–100.0)
Platelets: 344 10*3/uL (ref 150–400)
RBC: 3.97 MIL/uL — ABNORMAL LOW (ref 4.22–5.81)
RDW: 13.5 % (ref 11.5–15.5)
WBC: 8.3 10*3/uL (ref 4.0–10.5)
nRBC: 0 % (ref 0.0–0.2)

## 2022-05-16 LAB — CK: Total CK: 1062 U/L — ABNORMAL HIGH (ref 49–397)

## 2022-05-16 LAB — HEMOGLOBIN A1C
Hgb A1c MFr Bld: 10.4 % — ABNORMAL HIGH (ref 4.8–5.6)
Mean Plasma Glucose: 252 mg/dL

## 2022-05-16 LAB — MAGNESIUM: Magnesium: 2.1 mg/dL (ref 1.7–2.4)

## 2022-05-16 MED ORDER — INSULIN ASPART 100 UNIT/ML IJ SOLN
5.0000 [IU] | Freq: Once | INTRAMUSCULAR | Status: AC
Start: 2022-05-17 — End: 2022-05-16
  Administered 2022-05-16: 5 [IU] via SUBCUTANEOUS

## 2022-05-16 MED ORDER — INSULIN GLARGINE-YFGN 100 UNIT/ML ~~LOC~~ SOLN
14.0000 [IU] | Freq: Every day | SUBCUTANEOUS | Status: DC
  Administered 2022-05-17 – 2022-05-18 (×2): 14 [IU] via SUBCUTANEOUS
  Filled 2022-05-16 (×2): qty 0.14

## 2022-05-16 MED ORDER — INSULIN ASPART 100 UNIT/ML IJ SOLN
12.0000 [IU] | Freq: Once | INTRAMUSCULAR | Status: AC
  Administered 2022-05-16: 12 [IU] via SUBCUTANEOUS

## 2022-05-16 NOTE — Progress Notes (Addendum)
PROGRESS NOTE    Tyler Mann  JAS:[id number] DOB: 01/11/1981 DOA: 05/14/2022 PCP: Benita Stabile, MD    Brief Narrative:  Tyler Mann is a pleasant 41 y.o. male with past medical history significant for type 1 diabetes, polysubstance abuse, depression and anxiety presented to hospital with feeling of  insect infestation with larvae under his skin.  Patient does endorse using methamphetamine, cocaine, and hallucinogenic mushrooms. In the ED, patient was noted to be febrile but mildly tachycardic.  Blood glucose level was elevated at 239 with a creatinine of 2.0.  CBC showed leukocytosis at 17 500.  CK level was elevated at 1043.   Acetaminophen, salicylate, and ethanol levels were undetectable.  UDS was positive for amphetamines and cocaine.  Patient was given a liter of saline, 2 mg Ativan, and 25 mg Benadryl in the ED and patient was admitted to the medical floor due to AKI.    Assessment and Plan:  Principal Problem:   AKI (acute kidney injury) (HCC) Active Problems:   Diabetes mellitus type 1 (HCC)   Generalized anxiety disorder   Polysubstance abuse (HCC)   MDD (major depressive disorder), recurrent episode, severe (HCC)  Acute kidney injury. Serum creatinine at 2.0 on admission.  Received IV fluid hydration.  Creatinine today has improved at 1.1.  Encourage oral hydration.  At baseline.  Psychosis hallucinations delusions paranoia and agitation in the ED.  History of polysubstance abuse Psychiatry on board at this time.  Still appears to be agitated this morning.  We will follow psychiatry recommendation.  Communicated with psychiatry this morning.  Patient has been IVCed by psychiatry.  1 observation.  Leukocytosis.  Likely reactive.  No signs of infection.   Type I DM with hyperglycemia and episodes of hypoglycemia Latest hemoglobin A1c at 10.4.  Patient alert awake this morning.  Will change diet to diabetic.  Patient should resume his home insulin regimen on  discharge.  Elevated CK possible mild rhabdomyolysis. Likely from stimulant use.  Received IV fluids.  Improved renal function.  CK level 1062. Can be transitioned to oral fluids on discharge  At this time, patient is medically stable for disposition.   DVT prophylaxis: heparin injection 5,000 Units Start: 05/15/22 0600   Code Status:     Code Status: Full Code  Disposition:, Uncertain, might need psychiatric placement  Status is: Inpatient Remains inpatient appropriate because: IV fluids, psychiatric reassessment   Family Communication: Communicated with the patient at bedside.  Consultants:  Psychiatry  Procedures:  None  Antimicrobials:  None  Anti-infectives (From admission, onward)    None      Subjective: Today, patient was seen and examined at bedside.  Patient appears to be agitated and inquiring about going home.  Objective: Vitals:   05/15/22 1142 05/15/22 1734 05/15/22 2104 05/16/22 0927  BP: 118/71 123/65 (!) 146/76 (!) 142/70  Pulse: 79 75 83 80  Resp: 20 17  20   Temp: 98.4 F (36.9 C) 97.8 F (36.6 C) 98.1 F (36.7 C) 98.6 F (37 C)  TempSrc: Axillary Axillary Axillary Oral  SpO2: 98% 97% 98% 97%  Weight:      Height:        Intake/Output Summary (Last 24 hours) at 05/16/2022 1031 Last data filed at 05/16/2022 0904 Gross per 24 hour  Intake 1983.79 ml  Output --  Net 1983.79 ml   Filed Weights   05/14/22 1959  Weight: 77.1 kg    Physical Examination: Body mass index is 23.06 kg/m.  General:  Average built, not in obvious distress, anxious and mildly agitated HENT:   No scleral pallor or icterus noted. Oral mucosa is moist.  Chest:  Clear breath sounds.  Diminished breath sounds bilaterally. No crackles or wheezes.  CVS: S1 &S2 heard. No murmur.  Regular rate and rhythm. Abdomen: Soft, nontender, nondistended.  Bowel sounds are heard.   Extremities: No cyanosis, clubbing or edema.  Peripheral pulses are palpable. Psych: Alert,  awake and communicative.  Normal mood CNS:  No cranial nerve deficits.  Power equal in all extremities.   Skin: Warm and dry.  No rashes noted.  Data Reviewed:   CBC: Recent Labs  Lab 05/14/22 2216 05/15/22 0544 05/16/22 0228  WBC 17.5* 12.7* 8.3  NEUTROABS 14.3*  --   --   HGB 11.6* 10.4* 11.9*  HCT 33.8* 32.7* 35.9*  MCV 87.6 90.6 90.4  PLT 364 338 344    Basic Metabolic Panel: Recent Labs  Lab 05/14/22 2216 05/15/22 0544 05/16/22 0228  NA 139 137 138  K 3.6 4.1 5.1  CL 108 107 105  CO2 21* 23 24  GLUCOSE 239* 206* 321*  BUN 24* 24* 15  CREATININE 2.08* 1.44* 1.11  CALCIUM 9.5 8.7* 9.1  MG  --   --  2.1    Liver Function Tests: Recent Labs  Lab 05/14/22 2216 05/15/22 0544 05/16/22 0228  AST 48* 45* 46*  ALT 42 36 39  ALKPHOS 124 110 134*  BILITOT 1.0 0.7 1.1  PROT 7.8 6.8 6.7  ALBUMIN 4.2 3.6 3.3*     Radiology Studies: No results found.    LOS: 1 day    Joycelyn Das, MD [facility] Available via Epic secure chat 7am-7pm After these hours, please refer to coverage provider listed on [email or web address] 05/16/2022, 10:31 AM

## 2022-05-16 NOTE — Progress Notes (Signed)
CSW received request to meet with patient's spouse in room. She had to leave prior to CSW's arrival so CSW left resources at bedside and spoke with her on the phone. CSW will also email her a copy as requested ([email or web address].  Joaquin Courts, MSW, [facility]
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
## 2022-05-17 LAB — GLUCOSE, CAPILLARY
Glucose-Capillary: 281 mg/dL — ABNORMAL HIGH (ref 70–99)
Glucose-Capillary: 205 mg/dL — ABNORMAL HIGH (ref 70–99)
Glucose-Capillary: 266 mg/dL — ABNORMAL HIGH (ref 70–99)
Glucose-Capillary: 90 mg/dL (ref 70–99)

## 2022-05-17 MED ORDER — INSULIN ASPART 100 UNIT/ML IJ SOLN: 0.0000 [IU] | Freq: Three times a day (TID) | INTRAMUSCULAR | Status: DC

## 2022-05-17 MED ORDER — INSULIN ASPART 100 UNIT/ML IJ SOLN: 0.0000 [IU] | Freq: Every day | INTRAMUSCULAR | Status: DC

## 2022-05-17 MED ORDER — HALOPERIDOL LACTATE 5 MG/ML IJ SOLN: 5.0000 mg | Freq: Four times a day (QID) | INTRAMUSCULAR | Status: DC | PRN

## 2022-05-17 MED ORDER — LORAZEPAM 2 MG/ML IJ SOLN: 2.0000 mg | Freq: Four times a day (QID) | INTRAMUSCULAR | 0 refills | Status: DC | PRN

## 2022-05-17 MED ORDER — ACETAMINOPHEN 325 MG PO TABS: 650.0000 mg | ORAL_TABLET | Freq: Four times a day (QID) | ORAL | Status: DC | PRN

## 2022-05-17 MED ORDER — INSULIN DETEMIR 100 UNIT/ML ~~LOC~~ SOLN: 14.0000 [IU] | Freq: Every day | SUBCUTANEOUS | Status: DC

## 2022-05-17 MED ORDER — SENNOSIDES-DOCUSATE SODIUM 8.6-50 MG PO TABS: 1.0000 | ORAL_TABLET | Freq: Every evening | ORAL | Status: DC | PRN

## 2022-05-17 MED ORDER — DIPHENHYDRAMINE HCL 50 MG/ML IJ SOLN: 50.0000 mg | Freq: Four times a day (QID) | INTRAMUSCULAR | 0 refills | Status: DC | PRN

## 2022-05-17 NOTE — Progress Notes (Signed)
Earlier this shift after being seen by psychiatrist this patient was dissatisfied with the plan of care and also stated that he did not want any contact from spouse Annabelle Harman. Steps put in place to honor request, chart blocked, password obtained from patient, security informed and patients room changed. Patient requested to see patient advocate, nurse manager Marijean Niemann made aware. Patients possessions finally located by security and are with charge nurse pending sheriff's arrival to escort patient to [place]. Patient oriented to new room sitter remains in place.

## 2022-05-17 NOTE — TOC Initial Note (Signed)
Transition of Care [facility]) - Initial/Assessment Note    Patient Details  Name: Tyler Mann MRN: [id number] Date of Birth: 1981-07-20  Transition of Care [facility]) CM/SW Contact:    Mearl Latin, LCSW Phone Number: 05/17/2022, 9:53 AM  Clinical Narrative:                 Patient medically stable per MD. No beds available at [facility]. CSW faxed patient's referral to the following:  -[facility]   -[facility]   -[facility]   -[facility]   -[facility]   -[facility]   -[facility]   -[facility]   -[facility]   -[facility]   -[facility]      Expected Discharge Plan: Psychiatric [facility] Barriers to Discharge: Active Substance Use - Placement, ED Psych bed availability   Patient Goals and CMS Choice Patient states their goals for this hospitalization and ongoing recovery are:: inpatient recovery      Expected Discharge Plan and Services Expected Discharge Plan: Psychiatric Hospital In-house Referral: Clinical Social Work     Living arrangements for the past 2 months: Single Family Home                                      Prior Living Arrangements/Services Living arrangements for the past 2 months: Single Family Home Lives with:: Spouse Patient language and need for interpreter reviewed:: Yes Do you feel safe going back to the place where you live?: Yes      Need for Family Participation in Patient Care: Yes (Comment) Care giver support system in place?: Yes (comment)   Criminal Activity/Legal Involvement Pertinent to Current Situation/Hospitalization: No - Comment as needed  Activities of Daily Living      Permission Sought/Granted Permission sought to share information with : Facility Medical sales representative, Family Supports Permission granted to share information with : Yes, Verbal Permission  Granted  Share Information with NAME: Annabelle Harman     Permission granted to share info w Relationship: Spouse  Permission granted to share info w Contact Information: [phone]  Emotional Assessment Appearance:: Appears stated age Attitude/Demeanor/Rapport: Unable to Assess Affect (typically observed): Unable to Assess Orientation: : Oriented to Self, Oriented to Place, Oriented to  Time, Oriented to Situation Alcohol / Substance Use: Illicit Drugs Psych Involvement: Yes (comment)  Admission diagnosis:  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] Polysubstance abuse (HCC) [F19.10] AKI (acute kidney injury) (HCC) [N17.9] Patient Active Problem List   Diagnosis Date Noted   AKI (acute kidney injury) (HCC) 05/15/2022   Opioid dependence with intoxication with complication (HCC)    Medication overdose    Major depressive disorder, recurrent, severe without psychotic features (HCC)    MDD (major depressive disorder), recurrent episode, severe (HCC) 12/30/2014   Drug overdose, intentional (HCC) 12/26/2014   Hypoglycemia 12/26/2014   Overdose of benzodiazepine    Overdose of insulin    Substance induced mood disorder (HCC) 12/08/2014   Hyperglycemia without ketosis    Diabetes type 1, uncontrolled    Suicidal ideation 12/05/2014    Class: Acute   Hyperglycemia 12/03/2014   Polysubstance abuse (HCC) 12/03/2014   Hypokalemia 11/03/2014   Leukocytosis 11/02/2014   Hyperkalemia 11/02/2014   Chronic back pain 11/02/2014   Hematemesis 11/02/2014   Generalized anxiety disorder 11/02/2014   DKA (diabetic ketoacidoses) 11/19/2013  DKA, type 1 (HCC) 11/22/2012   Opiate dependence (HCC) 11/22/2012   Tobacco abuse 11/22/2012   Diabetic nephropathy (HCC) 11/22/2012   Diabetic ketoacidosis (HCC) 09/20/2012   Diabetes mellitus type 1 (HCC) 09/20/2012   Chronic pain 09/20/2012   Hyperlipidemia 09/20/2012   PCP:  Benita Stabile, MD Pharmacy:   [facility] [phone] - [place], [place] - [address] [facility] [address] [place] [address] Phone: [phone] Fax: [phone]     Social Determinants of Health (SDOH) Interventions    Readmission Risk Interventions     No data to display

## 2022-05-17 NOTE — Discharge Summary (Addendum)
Physician Discharge Summary   Patient: Tyler Mann MRN: [id number] DOB: 10/04/1981  Admit date:     05/14/2022  Discharge date: 05/18/22  Discharge Physician: Joycelyn Das   PCP: Benita Stabile, MD   Recommendations at discharge:  Follow-up with psychiatry provider after discharge.   Patient will benefit from endocrinology follow-up for type 1 diabetes as outpatient.  Discharge Diagnoses: Active Problems:   Diabetes mellitus type 1 (HCC)   Generalized anxiety disorder   Polysubstance abuse (HCC)   MDD (major depressive disorder), recurrent episode, severe (HCC)  Principal Problem (Resolved):   AKI (acute kidney injury) [facility])  Hospital Course: Tyler Mann is a pleasant 41 y.o. male with past medical history significant for type 1 diabetes, polysubstance abuse, depression and anxiety presented to the hospital with feeling of  insect infestation with larvae under his skin.  Patient does endorse using methamphetamine, cocaine, and hallucinogenic mushrooms. In the ED, patient was noted to be febrile but mildly tachycardic.  Blood glucose level was elevated at 239 with a creatinine of 2.0.  CBC showed leukocytosis at 17, 500.  CK level was elevated at 1043.   Acetaminophen, salicylate, and ethanol levels were undetectable.  UDS was positive for amphetamines and cocaine.  Patient was given a liter of saline, 2 mg Ativan, and 25 mg Benadryl in the ED and patient was admitted to the medical floor due to AKI.     Assessment and Plan:   Active Problems:   Diabetes mellitus type 1 (HCC)   Generalized anxiety disorder   Polysubstance abuse (HCC)   MDD (major depressive disorder), recurrent episode, severe (HCC)    Acute kidney injury. Serum creatinine at 2.0 on admission.  Received IV fluid hydration.  Creatinine improved to 1.1.  Patient has been encouraged to drink oral fluids.    Psychosis, hallucinations, delusions, paranoia and agitation in the ED.  History of polysubstance  abuse Psychiatry was consulted and medications were adjusted.  Patient was given Benadryl, Haldol during hospitalization.  Lamictal and risperidone has been recommended on discharge.  At this time patient has been considered for inpatient psych facility.   Leukocytosis.  Likely reactive.  No signs of infection.  WBC today at 8.3.   Type I DM with hyperglycemia and episodes of hypoglycemia Latest hemoglobin A1c of 10.4.  We will add long-acting insulin on discharge with sliding scale.  Patient states that he is supposed to be on an insulin pump.  His diabetes has been managed by his primary care provider.  Elevated CK with mild rhabdomyolysis. Likely from stimulant use.  Received IV fluids.  Improved renal function.  CK level 1062.  Encouraged oral hydration on discharge.  Consultants: Psychiatry Procedures performed: None Disposition: Home Diet recommendation:  Discharge Diet Orders (From admission, onward)     Start     Ordered   05/17/22 0000  Diet Carb Modified        05/17/22 1041           Carb modified diet DISCHARGE MEDICATION: Allergies as of 05/18/2022       Reactions   Trazodone And Nefazodone Other (See Comments)   Hallucinations and nightmares        Medication List     STOP taking these medications    enoxaparin 40 MG/0.4ML injection Commonly known as: LOVENOX   gabapentin 600 MG tablet Commonly known as: NEURONTIN   mirtazapine 15 MG disintegrating tablet Commonly known as: REMERON SOL-TAB       TAKE  these medications    cyclobenzaprine 10 MG tablet Commonly known as: FLEXERIL Take 10 mg by mouth 3 (three) times daily as needed for muscle spasms.   insulin detemir 100 UNIT/ML injection Commonly known as: LEVEMIR Inject 0.14 mLs (14 Units total) into the skin at bedtime. For diabetes management What changed: how much to take   lamoTRIgine 25 MG tablet Commonly known as: LaMICtal Take 1 tablet (25 mg total) by mouth daily for 14 days, THEN  2 tablets (50 mg total) daily for 14 days, THEN 4 tablets (100 mg total) daily for 14 days, THEN 8 tablets (200 mg total) daily. Start taking on: May 18, 2022 What changed:  medication strength See the new instructions.   levocetirizine 5 MG tablet Commonly known as: XYZAL Take 5 mg by mouth at bedtime.   NovoLOG 100 UNIT/ML injection Generic drug: insulin aspart Inject 5-20 Units into the skin 3 (three) times daily. What changed: Another medication with the same name was removed. Continue taking this medication, and follow the directions you see here.   risperiDONE 1 MG tablet Commonly known as: RisperDAL Take 1 tablet (1 mg total) by mouth at bedtime.   rosuvastatin 5 MG tablet Commonly known as: CRESTOR Take 5 mg by mouth daily.   Vyvanse 70 MG capsule Generic drug: lisdexamfetamine Take 70 mg by mouth daily.        Subjective Today, patient was seen and examined at bedside.  Appears very upset and agitated about plan of care.   Discharge Exam: Filed Weights   05/14/22 1959  Weight: 77.1 kg      05/18/2022    8:26 AM 05/17/2022   11:25 PM 05/17/2022    8:16 PM  Vitals with BMI  Systolic 116 131 329  Diastolic 81 71 83  Pulse 83 72 82    General:  Average built, not in obvious distress, sitting upright in the chair HENT:   No scleral pallor or icterus noted. Oral mucosa is moist.  Chest:  Clear breath sounds.  No crackles or wheezes.  CVS: S1 &S2 heard. No murmur.  Regular rate and rhythm. Abdomen: Soft, nontender, nondistended.  Bowel sounds are heard.   Extremities: No cyanosis, clubbing or edema.  Peripheral pulses are palpable. Psych: Alert, awake and oriented, appears anxious and agitated at times. CNS:  No cranial nerve deficits.  Power equal in all extremities.   Skin: Warm and dry.  No rashes noted.   Condition at discharge: good  The results of significant diagnostics from this hospitalization (including imaging, microbiology, ancillary and  laboratory) are listed below for reference.   Imaging Studies: No results found.  Microbiology: Results for orders placed or performed during the hospital encounter of 05/14/22  Resp Panel by RT-PCR (Flu A&B, Covid)     Status: None   Collection Time: 05/14/22  9:52 PM   Specimen: Nasal Swab  Result Value Ref Range Status   SARS Coronavirus 2 by RT PCR NEGATIVE NEGATIVE Final    Comment: (NOTE) SARS-CoV-2 target nucleic acids are NOT DETECTED.  The SARS-CoV-2 RNA is generally detectable in upper respiratory specimens during the acute phase of infection. The lowest concentration of SARS-CoV-2 viral copies this assay can detect is 138 copies/mL. A negative result does not preclude SARS-Cov-2 infection and should not be used as the sole basis for treatment or other patient management decisions. A negative result may occur with  improper specimen collection/handling, submission of specimen other than nasopharyngeal swab, presence of viral mutation(s)  within the areas targeted by this assay, and inadequate number of viral copies(<138 copies/mL). A negative result must be combined with clinical observations, patient history, and epidemiological information. The expected result is Negative.  Fact Sheet for Patients:  BloggerCourse.com  Fact Sheet for Healthcare Providers:  SeriousBroker.it  This test is no t yet approved or cleared by the [place] FDA and  has been authorized for detection and/or diagnosis of SARS-CoV-2 by FDA under an Emergency Use Authorization (EUA). This EUA will remain  in effect (meaning this test can be used) for the duration of the COVID-19 declaration under Section 564(b)(1) of the Act, 21 U.S.C.section 360bbb-3(b)(1), unless the authorization is terminated  or revoked sooner.       Influenza A by PCR NEGATIVE NEGATIVE Final   Influenza B by PCR NEGATIVE NEGATIVE Final    Comment: (NOTE) The Xpert  Xpress SARS-CoV-2/FLU/RSV plus assay is intended as an aid in the diagnosis of influenza from Nasopharyngeal swab specimens and should not be used as a sole basis for treatment. Nasal washings and aspirates are unacceptable for Xpert Xpress SARS-CoV-2/FLU/RSV testing.  Fact Sheet for Patients: BloggerCourse.com  Fact Sheet for Healthcare Providers: SeriousBroker.it  This test is not yet approved or cleared by the [place] FDA and has been authorized for detection and/or diagnosis of SARS-CoV-2 by FDA under an Emergency Use Authorization (EUA). This EUA will remain in effect (meaning this test can be used) for the duration of the COVID-19 declaration under Section 564(b)(1) of the Act, 21 U.S.C. section 360bbb-3(b)(1), unless the authorization is terminated or revoked.  Performed at [facility], [address], [place], [place] [address]     Labs: CBC: Recent Labs  Lab 05/14/22 2216 05/15/22 0544 05/16/22 0228  WBC 17.5* 12.7* 8.3  NEUTROABS 14.3*  --   --   HGB 11.6* 10.4* 11.9*  HCT 33.8* 32.7* 35.9*  MCV 87.6 90.6 90.4  PLT 364 338 344   Basic Metabolic Panel: Recent Labs  Lab 05/14/22 2216 05/15/22 0544 05/16/22 0228  NA 139 137 138  K 3.6 4.1 5.1  CL 108 107 105  CO2 21* 23 24  GLUCOSE 239* 206* 321*  BUN 24* 24* 15  CREATININE 2.08* 1.44* 1.11  CALCIUM 9.5 8.7* 9.1  MG  --   --  2.1   Liver Function Tests: Recent Labs  Lab 05/14/22 2216 05/15/22 0544 05/16/22 0228  AST 48* 45* 46*  ALT 42 36 39  ALKPHOS 124 110 134*  BILITOT 1.0 0.7 1.1  PROT 7.8 6.8 6.7  ALBUMIN 4.2 3.6 3.3*   CBG: Recent Labs  Lab 05/17/22 1240 05/17/22 1608 05/17/22 2151 05/18/22 0757 05/18/22 1227  GLUCAP 205* 266* 90 155* 327*    Discharge time spent: greater than 30 minutes.  Signed: Joycelyn Das, MD [facility] 05/18/2022

## 2022-05-17 NOTE — Hospital Course (Addendum)
Tyler Mann is a pleasant 41 y.o. male with past medical history significant for type 1 diabetes, polysubstance abuse, depression and anxiety presented to the hospital with feeling of  insect infestation with larvae under his skin.  Patient does endorse using methamphetamine, cocaine, and hallucinogenic mushrooms. In the ED, patient was noted to be febrile but mildly tachycardic.  Blood glucose level was elevated at 239 with a creatinine of 2.0.  CBC showed leukocytosis at 17, 500.  CK level was elevated at 1043.   Acetaminophen, salicylate, and ethanol levels were undetectable.  UDS was positive for amphetamines and cocaine.  Patient was given a liter of saline, 2 mg Ativan, and 25 mg Benadryl in the ED and patient was admitted to the medical floor due to AKI.     Assessment and Plan:   Active Problems:   Diabetes mellitus type 1 (HCC)   Generalized anxiety disorder   Polysubstance abuse (HCC)   MDD (major depressive disorder), recurrent episode, severe (HCC)    Acute kidney injury. Serum creatinine at 2.0 on admission.  Received IV fluid hydration.  Creatinine improved to 1.1.  Patient has been encouraged to drink oral fluids.    Psychosis, hallucinations, delusions, paranoia and agitation in the ED.  History of polysubstance abuse Psychiatry was consulted and medications were adjusted.  Patient was given Benadryl, Haldol during hospitalization.  Lamictal and risperidone has been recommended on discharge.  At this time patient has been considered for inpatient psych facility.   Leukocytosis.  Likely reactive.  No signs of infection.  WBC today at 8.3.   Type I DM with hyperglycemia and episodes of hypoglycemia Latest hemoglobin A1c of 10.4.  We will add long-acting insulin on discharge with sliding scale.  Patient states that he is supposed to be on an insulin pump.  His diabetes has been managed by his primary care provider.  Elevated CK with mild rhabdomyolysis. Likely from stimulant  use.  Received IV fluids.  Improved renal function.  CK level 1062.  Encouraged oral hydration on discharge.
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
## 2022-05-17 NOTE — Progress Notes (Signed)
At around 1715 - 1930, Pt. Belongings was found and brought up by security guard and given to charge nurse Desiray.  She was informed that patient's father wanted to get his belongings and take them home.  Father was informed that he could pick them up as he was leaving to go home.  Pt. Father, came up to the desk around 1920 during shift change to get patient's belongings, and was going through patient's wallet.  This RN ask if he was looking for something, he stated the patient informed him that had a couple hundred dollars in his wallet, the mother who had just walked up to the desk, stated it was $400 to $500.  This nurse had the secretary to call security and this nurse spoke to him and requested the security guard that brought the bag up to come back up to speak to the patient.  The security guard, took patient's name and would call unit back. Returned called received back from security and stated he spoke with officer who brought belongings up.  He stated, when he located patient's belongings from the purple zone, he brought them right up. The night lead security guard came up and informed the parents of what had happen. This RN asked patient's day shift nurse, Misty Stanley what all did patient ask for when requesting his belonging?  She stated, he was only concerned about his cell phone and wallet. Will continue to follow and assist as needed.    Forbes Cellar, RN

## 2022-05-18 LAB — GLUCOSE, CAPILLARY
Glucose-Capillary: 155 mg/dL — ABNORMAL HIGH (ref 70–99)
Glucose-Capillary: 327 mg/dL — ABNORMAL HIGH (ref 70–99)

## 2022-05-18 MED ORDER — INSULIN DETEMIR 100 UNIT/ML ~~LOC~~ SOLN: 14.0000 [IU] | Freq: Every day | SUBCUTANEOUS | 2 refills | Status: DC

## 2022-05-18 MED ORDER — LAMOTRIGINE 25 MG PO TABS: ORAL_TABLET | ORAL | 2 refills | Status: DC

## 2022-05-18 MED ORDER — RISPERIDONE 1 MG PO TABS: 1.0000 mg | ORAL_TABLET | Freq: Every day | ORAL | 0 refills | Status: DC
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
## 2022-05-18 NOTE — Progress Notes (Signed)
This nurse was phoned by sheriffs office about 30 minutes ago and was informed that someone from [facility] department will transport this patient to [facility]. This RN phoned [facility] and gave nurse Lyla Son report on this patient.

## 2022-05-18 NOTE — Discharge Instructions (Signed)
Patient is instructed prior to discharge to:  Take all medications as prescribed by his/her mental healthcare provider. Report any adverse effects and or reactions from the medicines to his/her outpatient provider promptly. Keep all scheduled appointments, to ensure that you are getting refills on time and to avoid any interruption in your medication.  If you are unable to keep an appointment call to reschedule.  Be sure to follow-up with resources and follow-up appointments provided.  Patient has been instructed & cautioned: To not engage in alcohol and or illegal drug use while on prescription medicines. In the event of worsening symptoms, patient is instructed to call the crisis hotline, 911 and or go to the nearest ED for appropriate evaluation and treatment of symptoms. The [facility] is located at [address] in [place].  To follow-up with his/her primary care provider for your other medical issues, concerns and or health care needs.

## 2022-05-18 NOTE — Progress Notes (Signed)
Pt's father stated he will pick up pt for discharge. Pt's father stated he will be here in about an hour or two with pt's clothing and then transport pt him home.

## 2022-05-18 NOTE — TOC Progression Note (Addendum)
Transition of Care [facility]) - Progression Note    Patient Details  Name: Tyler Mann MRN: [id number] Date of Birth: [birth year]  [facility] CM/SW Contact  Mearl Latin, LCSW Phone Number: 05/18/2022, 8:57 AM  Clinical Narrative:    8:57am-CSW spoke with [facility] and they are aware that patient will arrive today. CSW requested RN call for report [phone]). Awaiting Sheriff transport.   9:32am-CSW notified that psychiatry has rounded on the patient and has deemed him safe to remove IVC order. CSW cancelled [facility] transport and made RN aware.   IVC change of commitment paper signed by MD and faxed to magistrate; placed on patient's hard chart for medical records.    Expected Discharge Plan: Psychiatric Hospital Barriers to Discharge: Active Substance Use - Placement, ED Psych bed availability  Expected Discharge Plan and Services Expected Discharge Plan: Psychiatric Hospital In-house Referral: Clinical Social Work     Living arrangements for the past 2 months: Single Family Home Expected Discharge Date: 05/17/22                                     Social Determinants of Health (SDOH) Interventions    Readmission Risk Interventions     No data to display

## 2022-05-18 NOTE — Progress Notes (Signed)
Please refer to discharge summary dated 05/17/2022.  Seen by psychiatry and at this time IVC has been discontinued.  Lamictal and risperidone has been added to the regimen.

## 2022-06-30 ENCOUNTER — Other Ambulatory Visit: Payer: Self-pay

## 2022-06-30 ENCOUNTER — Encounter (HOSPITAL_COMMUNITY): Payer: Self-pay | Admitting: Emergency Medicine

## 2022-06-30 ENCOUNTER — Emergency Department (HOSPITAL_COMMUNITY)
Admission: EM | Admit: 2022-06-30 | Discharge: 2022-07-01 | Disposition: A | Discharge status: Home / Self Care | Payer: No Typology Code available for payment source | Attending: Emergency Medicine | Admitting: Emergency Medicine

## 2022-06-30 DIAGNOSIS — R739 Hyperglycemia, unspecified: Secondary | ICD-10-CM | POA: Diagnosis present

## 2022-06-30 LAB — CBC WITH DIFFERENTIAL/PLATELET
Abs Immature Granulocytes: 0.05 10*3/uL (ref 0.00–0.07)
Basophils Absolute: 0.1 10*3/uL (ref 0.0–0.1)
Basophils Relative: 1 %
Eosinophils Absolute: 0.1 10*3/uL (ref 0.0–0.5)
Eosinophils Relative: 2 %
HCT: 36.1 % — ABNORMAL LOW (ref 39.0–52.0)
Hemoglobin: 11.8 g/dL — ABNORMAL LOW (ref 13.0–17.0)
Immature Granulocytes: 1 %
Lymphocytes Relative: 22 %
Lymphs Abs: 1.9 10*3/uL (ref 0.7–4.0)
MCH: 29.6 pg (ref 26.0–34.0)
MCHC: 32.7 g/dL (ref 30.0–36.0)
MCV: 90.5 fL (ref 80.0–100.0)
Monocytes Absolute: 0.4 10*3/uL (ref 0.1–1.0)
Monocytes Relative: 5 %
Neutro Abs: 6.1 10*3/uL (ref 1.7–7.7)
Neutrophils Relative %: 69 %
Platelets: 426 10*3/uL — ABNORMAL HIGH (ref 150–400)
RBC: 3.99 MIL/uL — ABNORMAL LOW (ref 4.22–5.81)
RDW: 13.3 % (ref 11.5–15.5)
WBC: 8.8 10*3/uL (ref 4.0–10.5)
nRBC: 0 % (ref 0.0–0.2)

## 2022-06-30 LAB — BASIC METABOLIC PANEL
Anion gap: 9 (ref 5–15)
BUN: 32 mg/dL — ABNORMAL HIGH (ref 6–20)
CO2: 23 mmol/L (ref 22–32)
Calcium: 8.6 mg/dL — ABNORMAL LOW (ref 8.9–10.3)
Chloride: 94 mmol/L — ABNORMAL LOW (ref 98–111)
Creatinine, Ser: 1.08 mg/dL (ref 0.61–1.24)
GFR, Estimated: >60 mL/min (ref >60)
Glucose, Bld: 591 mg/dL (ref 70–99)
Potassium: 4.8 mmol/L (ref 3.5–5.1)
Sodium: 126 mmol/L — ABNORMAL LOW (ref 135–145)

## 2022-06-30 LAB — CBG MONITORING, ED
Glucose-Capillary: 547 mg/dL (ref 70–99)
Glucose-Capillary: 569 mg/dL (ref 70–99)

## 2022-06-30 MED ORDER — SODIUM CHLORIDE 0.9 % IV SOLN
INTRAVENOUS | Status: DC
Start: 2022-06-30 — End: 2022-07-01

## 2022-06-30 MED ORDER — INSULIN REGULAR(HUMAN) IN NACL 100-0.9 UT/100ML-% IV SOLN
INTRAVENOUS | Status: DC
Start: 2022-06-30 — End: 2022-07-01
  Administered 2022-06-30: 11.5 [IU]/h via INTRAVENOUS
  Filled 2022-06-30: qty 100

## 2022-06-30 MED ORDER — LACTATED RINGERS IV SOLN
INTRAVENOUS | Status: DC
Start: 2022-06-30 — End: 2022-06-30

## 2022-06-30 MED ORDER — LACTATED RINGERS IV BOLUS: 1000.0000 mL | INTRAVENOUS | Status: DC

## 2022-06-30 MED ORDER — DEXTROSE IN LACTATED RINGERS 5 % IV SOLN: INTRAVENOUS | Status: DC

## 2022-06-30 MED ORDER — DEXTROSE 50 % IV SOLN: 0.0000 mL | INTRAVENOUS | Status: DC | PRN

## 2022-06-30 MED ORDER — SODIUM CHLORIDE 0.9 % IV SOLN: Freq: Once | INTRAVENOUS | Status: AC

## 2022-06-30 NOTE — ED Provider Notes (Signed)
[facility] EMERGENCY DEPARTMENT Provider Note   CSN: [id number] Arrival date & time: 06/30/22  2226     History  Chief Complaint  Patient presents with  . Hyperglycemia    Tyler Mann is a 41 y.o. male.   Hyperglycemia  Patient has history of diabetes, bipolar, history of substance abuse who presents to the ED with complaints of hyperglycemia.  Patient states he has an insulin pump.  He has been using the pump to administer extra doses of NovoLog today because his sugars have been running high in the 400s and 500s.  Patient is not having any nausea or vomiting.  He denies any dysuria.  He is not have any fevers or chills.  Patient states he believes the pump is working properly.    Home Medications Prior to Admission medications   Medication Sig Start Date End Date Taking? Authorizing Provider  cyclobenzaprine (FLEXERIL) 10 MG tablet Take 10 mg by mouth 3 (three) times daily as needed for muscle spasms.    [provider]  insulin detemir (LEVEMIR) 100 UNIT/ML injection Inject 0.14 mLs (14 Units total) into the skin at bedtime. For diabetes management 05/18/22 08/16/22  Pokhrel, Rebekah [place], MD  lamoTRIgine (LAMICTAL) 25 MG tablet Take 1 tablet (25 mg total) by mouth daily for 14 days, THEN 2 tablets (50 mg total) daily for 14 days, THEN 4 tablets (100 mg total) daily for 14 days, THEN 8 tablets (200 mg total) daily. 05/18/22 07/29/22  Pokhrel, Rebekah Chesterfield, MD  levocetirizine (XYZAL) 5 MG tablet Take 5 mg by mouth at bedtime. 02/19/22   [provider]  NOVOLOG 100 UNIT/ML injection Inject 5-20 Units into the skin 3 (three) times daily. 04/20/22   [provider]  risperiDONE (RISPERDAL) 1 MG tablet Take 1 tablet (1 mg total) by mouth at bedtime. 05/18/22 05/18/23  Pokhrel, Rebekah Chesterfield, MD  rosuvastatin (CRESTOR) 5 MG tablet Take 5 mg by mouth daily. 04/10/22   [provider]  VYVANSE 70 MG capsule Take 70 mg by mouth daily. 04/05/22   [provider]       Allergies    Trazodone and nefazodone    Review of Systems   Review of Systems  Physical Exam Updated Vital Signs BP 138/84   Pulse 79   Temp 97.7 F (36.5 C) (Oral)   Resp 18   Ht 1.829 m (6')   SpO2 98%   BMI 23.06 kg/m  Physical Exam Vitals and nursing note reviewed.  Constitutional:      General: He is not in acute distress.    Appearance: He is well-developed.  HENT:     Head: Normocephalic and atraumatic.     Right Ear: External ear normal.     Left Ear: External ear normal.  Eyes:     General: No scleral icterus.       Right eye: No discharge.        Left eye: No discharge.     Conjunctiva/sclera: Conjunctivae normal.  Neck:     Trachea: No tracheal deviation.  Cardiovascular:     Rate and Rhythm: Normal rate and regular rhythm.  Pulmonary:     Effort: Pulmonary effort is normal. No respiratory distress.     Breath sounds: Normal breath sounds. No stridor. No wheezing or rales.  Abdominal:     General: Bowel sounds are normal. There is no distension.     Palpations: Abdomen is soft.     Tenderness: There is no abdominal tenderness. There is  no guarding or rebound.  Musculoskeletal:        General: No tenderness or deformity.     Cervical back: Neck supple.  Skin:    General: Skin is warm and dry.     Findings: No rash.     Comments: No signs of erythema or swelling at the site of his insulin pump in the left shoulder  Neurological:     General: No focal deficit present.     Mental Status: He is alert.     Cranial Nerves: No cranial nerve deficit (no facial droop, extraocular movements intact, no slurred speech).     Sensory: No sensory deficit.     Motor: No abnormal muscle tone or seizure activity.     Coordination: Coordination normal.  Psychiatric:        Mood and Affect: Mood normal.     ED Results / Procedures / Treatments   Labs (all labs ordered are listed, but only abnormal results are displayed) Labs Reviewed  BASIC METABOLIC PANEL  - Abnormal; Notable for the following components:      Result Value   Sodium 126 (*)    Chloride 94 (*)    Glucose, Bld 591 (*)    BUN 32 (*)    Calcium 8.6 (*)    All other components within normal limits  CBC WITH DIFFERENTIAL/PLATELET - Abnormal; Notable for the following components:   RBC 3.99 (*)    Hemoglobin 11.8 (*)    HCT 36.1 (*)    Platelets 426 (*)    All other components within normal limits  CBG MONITORING, ED - Abnormal; Notable for the following components:   Glucose-Capillary 547 (*)    All other components within normal limits  CBG MONITORING, ED - Abnormal; Notable for the following components:   Glucose-Capillary 569 (*)    All other components within normal limits  URINALYSIS, ROUTINE W REFLEX MICROSCOPIC    EKG None  Radiology No results found.  Procedures Procedures    Medications Ordered in ED Medications  insulin regular, human (MYXREDLIN) 100 units/ 100 mL infusion (11.5 Units/hr Intravenous New Bag/Given 06/30/22 2336)  dextrose 5 % in lactated ringers infusion (0 mLs Intravenous Hold 06/30/22 2305)  dextrose 50 % solution 0-50 mL (has no administration in time range)  0.9 %  sodium chloride infusion ( Intravenous New Bag/Given 06/30/22 2339)  0.9 %  sodium chloride infusion ( Intravenous New Bag/Given 06/30/22 2339)    ED Course/ Medical Decision Making/ A&P Clinical Course as of 06/30/22 2347  Sat Jun 30, 2022  2345 CBC with Differential (PNL)(!) Mild anemia [JK]  2346 Basic metabolic panel(!!) Hyperglycemia without signs of acidosis [JK]    Clinical Course User Index [JK] Linwood Dibbles, MD                           Medical Decision Making Amount and/or Complexity of Data Reviewed Labs: ordered.  Risk Prescription drug management.   Presented with complaints of hyperglycemia.  Concerned about the possibility of DKA as he is a type I diabetic.  Patient states he has been giving himself extra doses on his insulin pump and his sugars  remain elevated.  No signs of infection at this time.  I am concerned that the pump may not be functioning properly.  Initial laboratory test show hyperglycemia without signs of acidosis.  We will start him on an insulin drip, IV fluids.  We will continue to  monitor her sugar.  Care turned over to Dr Rubin Payor at shift change         Final Clinical Impression(s) / ED Diagnoses Final diagnoses:  None    Rx / DC Orders ED Discharge Orders     None         Linwood Dibbles, MD 06/30/22 2347

## 2022-06-30 NOTE — ED Triage Notes (Signed)
Pt brought in by [facility] after he has been unable to get his blood sugars under control. Pt with Type 1 diabetes and has an insulin pump. Self-administered 50 units of Novolog today. States glucose readings in 400's for several hrs  and reading only "high" for last 4 hrs. EMS states their glucometer read "high" as well.

## 2022-06-30 NOTE — ED Notes (Signed)
Date and time results received: 06/30/22 at 2333   Test: Glucose Critical Value: 591  Name of Provider Notified: Roselyn Bering  Orders Received? Or Actions Taken?:  Awaiting further action by provider.

## 2022-07-01 LAB — URINALYSIS, ROUTINE W REFLEX MICROSCOPIC
Bacteria, UA: NONE SEEN
Bilirubin Urine: NEGATIVE
Glucose, UA: >=500 mg/dL — AB
Hgb urine dipstick: NEGATIVE
Ketones, ur: 5 mg/dL — AB
Leukocytes,Ua: NEGATIVE
Nitrite: NEGATIVE
Protein, ur: NEGATIVE mg/dL
Specific Gravity, Urine: 1.022 (ref 1.005–1.030)
pH: 7 (ref 5.0–8.0)

## 2022-07-01 LAB — CBG MONITORING, ED
Glucose-Capillary: 186 mg/dL — ABNORMAL HIGH (ref 70–99)
Glucose-Capillary: 326 mg/dL — ABNORMAL HIGH (ref 70–99)
Glucose-Capillary: 404 mg/dL — ABNORMAL HIGH (ref 70–99)

## 2022-07-01 NOTE — ED Notes (Signed)
ED Provider at bedside. 

## 2022-07-01 NOTE — ED Provider Notes (Signed)
  Physical Exam  BP 136/68   Pulse 80   Temp 98.1 F (36.7 C) (Oral)   Resp 16   Ht 6' (1.829 m)   SpO2 98%   BMI 23.06 kg/m   Physical Exam  Procedures  Procedures  ED Course / MDM   Clinical Course as of 07/01/22 0212  Sat Jun 30, 2022  2345 CBC with Differential (PNL)(!) Mild anemia [JK]  2346 Basic metabolic panel(!!) Hyperglycemia without signs of acidosis [JK]    Clinical Course User Index [JK] Linwood Dibbles, MD   Medical Decision Making Amount and/or Complexity of Data Reviewed Labs: ordered. Decision-making details documented in ED Course.  Risk Prescription drug management.   Patient with hyperglycemia.  Received in signout.  No acidosis.  Fluid boluses given and started on insulin drip.  Sugar improved.  Down to 180.  Can manage further at home.  Will discharge home.  We will follow-up with doctors about verifying the function of his insulin pump.       Benjiman Core, MD 07/01/22 [phone]

## 2022-09-18 ENCOUNTER — Other Ambulatory Visit: Payer: Self-pay

## 2022-09-18 ENCOUNTER — Encounter (HOSPITAL_COMMUNITY): Payer: Self-pay | Admitting: *Deleted

## 2022-09-18 ENCOUNTER — Emergency Department (HOSPITAL_COMMUNITY)
Admission: EM | Admit: 2022-09-18 | Discharge: 2022-09-18 | Disposition: A | Discharge status: Home / Self Care | Payer: No Typology Code available for payment source | Attending: Emergency Medicine | Admitting: Emergency Medicine

## 2022-09-18 DIAGNOSIS — E1065 Type 1 diabetes mellitus with hyperglycemia: Secondary | ICD-10-CM | POA: Insufficient documentation

## 2022-09-18 DIAGNOSIS — M5432 Sciatica, left side: Secondary | ICD-10-CM | POA: Diagnosis not present

## 2022-09-18 DIAGNOSIS — M549 Dorsalgia, unspecified: Secondary | ICD-10-CM | POA: Diagnosis present

## 2022-09-18 DIAGNOSIS — E1165 Type 2 diabetes mellitus with hyperglycemia: Secondary | ICD-10-CM

## 2022-09-18 DIAGNOSIS — M5431 Sciatica, right side: Secondary | ICD-10-CM | POA: Insufficient documentation

## 2022-09-18 LAB — URINALYSIS, ROUTINE W REFLEX MICROSCOPIC
Bacteria, UA: NONE SEEN
Bilirubin Urine: NEGATIVE
Glucose, UA: >=500 mg/dL — AB
Hgb urine dipstick: NEGATIVE
Ketones, ur: NEGATIVE mg/dL
Leukocytes,Ua: NEGATIVE
Nitrite: NEGATIVE
Protein, ur: NEGATIVE mg/dL
Specific Gravity, Urine: 1.014 (ref 1.005–1.030)
pH: 7 (ref 5.0–8.0)

## 2022-09-18 LAB — CBC WITH DIFFERENTIAL/PLATELET
Abs Immature Granulocytes: 0.03 10*3/uL (ref 0.00–0.07)
Basophils Absolute: 0.1 10*3/uL (ref 0.0–0.1)
Basophils Relative: 1 %
Eosinophils Absolute: 0.4 10*3/uL (ref 0.0–0.5)
Eosinophils Relative: 4 %
HCT: 31.1 % — ABNORMAL LOW (ref 39.0–52.0)
Hemoglobin: 9.9 g/dL — ABNORMAL LOW (ref 13.0–17.0)
Immature Granulocytes: 0 %
Lymphocytes Relative: 16 %
Lymphs Abs: 1.6 10*3/uL (ref 0.7–4.0)
MCH: 29.1 pg (ref 26.0–34.0)
MCHC: 31.8 g/dL (ref 30.0–36.0)
MCV: 91.5 fL (ref 80.0–100.0)
Monocytes Absolute: 0.7 10*3/uL (ref 0.1–1.0)
Monocytes Relative: 7 %
Neutro Abs: 7.4 10*3/uL (ref 1.7–7.7)
Neutrophils Relative %: 72 %
Platelets: 376 10*3/uL (ref 150–400)
RBC: 3.4 MIL/uL — ABNORMAL LOW (ref 4.22–5.81)
RDW: 14.1 % (ref 11.5–15.5)
WBC: 10.2 10*3/uL (ref 4.0–10.5)
nRBC: 0 % (ref 0.0–0.2)

## 2022-09-18 LAB — COMPREHENSIVE METABOLIC PANEL
ALT: 46 U/L — ABNORMAL HIGH (ref 0–44)
AST: 21 U/L (ref 15–41)
Albumin: 3.4 g/dL — ABNORMAL LOW (ref 3.5–5.0)
Alkaline Phosphatase: 120 U/L (ref 38–126)
Anion gap: 6 (ref 5–15)
BUN: 14 mg/dL (ref 6–20)
CO2: 27 mmol/L (ref 22–32)
Calcium: 8.9 mg/dL (ref 8.9–10.3)
Chloride: 104 mmol/L (ref 98–111)
Creatinine, Ser: 0.91 mg/dL (ref 0.61–1.24)
GFR, Estimated: >60 mL/min (ref >60)
Glucose, Bld: 226 mg/dL — ABNORMAL HIGH (ref 70–99)
Potassium: 4 mmol/L (ref 3.5–5.1)
Sodium: 137 mmol/L (ref 135–145)
Total Bilirubin: 0.6 mg/dL (ref 0.3–1.2)
Total Protein: 7.2 g/dL (ref 6.5–8.1)

## 2022-09-18 LAB — BLOOD GAS, VENOUS
Acid-Base Excess: 0.9 mmol/L (ref 0.0–2.0)
Bicarbonate: 27.5 mmol/L (ref 20.0–28.0)
Drawn by: 65318
O2 Saturation: 39.1 %
Patient temperature: 36.7
pCO2, Ven: 50 mmHg (ref 44–60)
pH, Ven: 7.34 (ref 7.25–7.43)
pO2, Ven: <31 mmHg — CL (ref 32–45)

## 2022-09-18 LAB — CBG MONITORING, ED: Glucose-Capillary: 168 mg/dL — ABNORMAL HIGH (ref 70–99)

## 2022-09-18 MED ORDER — METHOCARBAMOL 500 MG PO TABS: 500.0000 mg | ORAL_TABLET | Freq: Two times a day (BID) | ORAL | 0 refills | Status: DC | PRN

## 2022-09-18 MED ORDER — METHOCARBAMOL 500 MG PO TABS
500.0000 mg | ORAL_TABLET | Freq: Once | ORAL | Status: AC
  Administered 2022-09-18: 500 mg via ORAL
  Filled 2022-09-18: qty 1

## 2022-09-18 MED ORDER — NAPROXEN 500 MG PO TABS: 500.0000 mg | ORAL_TABLET | Freq: Two times a day (BID) | ORAL | 0 refills | Status: DC | PRN

## 2022-09-18 MED ORDER — SODIUM CHLORIDE 0.9 % IV BOLUS
1000.0000 mL | Freq: Once | INTRAVENOUS | Status: AC
  Administered 2022-09-18: 1000 mL via INTRAVENOUS

## 2022-09-18 MED ORDER — KETOROLAC TROMETHAMINE 30 MG/ML IJ SOLN
30.0000 mg | Freq: Once | INTRAMUSCULAR | Status: AC
  Administered 2022-09-18: 30 mg via INTRAVENOUS
  Filled 2022-09-18: qty 1

## 2022-09-18 NOTE — ED Provider Notes (Signed)
[facility] EMERGENCY DEPARTMENT Provider Note   CSN: [id number] Arrival date & time: 09/18/22  4259     History  Chief Complaint  Patient presents with   Back Pain    Tyler Mann is a 41 y.o. male.  Pt is a 41 yo male with a pmhx significant for hx polysubstance abuse, DM1, HLD, bipolar d/o, and ADD.  Pt said he's been having back pain for about a month.  He did go to the chiropractor for his back and that did seem to help.  Pt said he slept on the couch and sx have worsened.  He has pain in his back radiating down his legs.  He said he can't stand up straight.  Pt said his blood sugars have been "crazy."  He has not taken his am insulin yet.  No f/c.  No recent IVDA.  He does not want any narcotics for his pain.        Home Medications Prior to Admission medications   Medication Sig Start Date End Date Taking? Authorizing Provider  methocarbamol (ROBAXIN) 500 MG tablet Take 1 tablet (500 mg total) by mouth 2 (two) times daily as needed for muscle spasms. 09/18/22  Yes Isla Pence, MD  naproxen (NAPROSYN) 500 MG tablet Take 1 tablet (500 mg total) by mouth 2 (two) times daily as needed for moderate pain or mild pain. 09/18/22  Yes Isla Pence, MD  cyclobenzaprine (FLEXERIL) 10 MG tablet Take 10 mg by mouth 3 (three) times daily as needed for muscle spasms.    [provider]  insulin detemir (LEVEMIR) 100 UNIT/ML injection Inject 0.14 mLs (14 Units total) into the skin at bedtime. For diabetes management 05/18/22 08/16/22  Pokhrel, Corrie Mckusick, MD  lamoTRIgine (LAMICTAL) 25 MG tablet Take 1 tablet (25 mg total) by mouth daily for 14 days, THEN 2 tablets (50 mg total) daily for 14 days, THEN 4 tablets (100 mg total) daily for 14 days, THEN 8 tablets (200 mg total) daily. 05/18/22 07/29/22  Pokhrel, Corrie Mckusick, MD  levocetirizine (XYZAL) 5 MG tablet Take 5 mg by mouth at bedtime. 02/19/22   [provider]  NOVOLOG 100 UNIT/ML injection Inject 5-20 Units into the skin 3  (three) times daily. 04/20/22   [provider]  risperiDONE (RISPERDAL) 1 MG tablet Take 1 tablet (1 mg total) by mouth at bedtime. 05/18/22 05/18/23  Pokhrel, Corrie Mckusick, MD  rosuvastatin (CRESTOR) 5 MG tablet Take 5 mg by mouth daily. 04/10/22   [provider]  VYVANSE 70 MG capsule Take 70 mg by mouth daily. 04/05/22   [provider]      Allergies    Trazodone and nefazodone    Review of Systems   Review of Systems  Musculoskeletal:  Positive for back pain.  All other systems reviewed and are negative.   Physical Exam Updated Vital Signs BP 132/68   Pulse 81   Temp 98.1 F (36.7 C) (Oral)   Resp 18   Ht 6' (1.829 m)   Wt 81.6 kg   SpO2 99%   BMI 24.41 kg/m  Physical Exam Vitals and nursing note reviewed.  Constitutional:      Appearance: Normal appearance.  HENT:     Head: Normocephalic and atraumatic.     Right Ear: External ear normal.     Left Ear: External ear normal.     Nose: Nose normal.     Mouth/Throat:     Mouth: Mucous membranes are moist.  Pharynx: Oropharynx is clear.  Eyes:     Extraocular Movements: Extraocular movements intact.     Conjunctiva/sclera: Conjunctivae normal.     Pupils: Pupils are equal, round, and reactive to light.  Cardiovascular:     Rate and Rhythm: Normal rate and regular rhythm.     Pulses: Normal pulses.     Heart sounds: Normal heart sounds.  Pulmonary:     Effort: Pulmonary effort is normal.  Abdominal:     General: Abdomen is flat. Bowel sounds are normal.     Palpations: Abdomen is soft.     Tenderness: There is abdominal tenderness in the right lower quadrant and left lower quadrant.  Musculoskeletal:       Arms:     Cervical back: Normal range of motion and neck supple.  Skin:    General: Skin is warm.     Capillary Refill: Capillary refill takes less than 2 seconds.  Neurological:     General: No focal deficit present.     Mental Status: He is alert and oriented to person, place, and  time.  Psychiatric:        Mood and Affect: Mood normal.        Behavior: Behavior normal.     ED Results / Procedures / Treatments   Labs (all labs ordered are listed, but only abnormal results are displayed) Labs Reviewed  COMPREHENSIVE METABOLIC PANEL - Abnormal; Notable for the following components:      Result Value   Glucose, Bld 226 (*)    Albumin 3.4 (*)    ALT 46 (*)    All other components within normal limits  CBC WITH DIFFERENTIAL/PLATELET - Abnormal; Notable for the following components:   RBC 3.40 (*)    Hemoglobin 9.9 (*)    HCT 31.1 (*)    All other components within normal limits  URINALYSIS, ROUTINE W REFLEX MICROSCOPIC - Abnormal; Notable for the following components:   Color, Urine STRAW (*)    Glucose, UA >=500 (*)    All other components within normal limits  BLOOD GAS, VENOUS - Abnormal; Notable for the following components:   pO2, Ven <31 (*)    All other components within normal limits  CBG MONITORING, ED - Abnormal; Notable for the following components:   Glucose-Capillary 168 (*)    All other components within normal limits    EKG None  Radiology No results found.  Procedures Procedures    Medications Ordered in ED Medications  ketorolac (TORADOL) 30 MG/ML injection 30 mg (30 mg Intravenous Given 09/18/22 0934)  sodium chloride 0.9 % bolus 1,000 mL (0 mLs Intravenous Stopped 09/18/22 1028)  methocarbamol (ROBAXIN) tablet 500 mg (500 mg Oral Given 09/18/22 1124)  sodium chloride 0.9 % bolus 1,000 mL (1,000 mLs Intravenous New Bag/Given 09/18/22 1206)    ED Course/ Medical Decision Making/ A&P                           Medical Decision Making Amount and/or Complexity of Data Reviewed Labs: ordered.  Risk Prescription drug management.   This patient presents to the ED for concern of back pain, this involves an extensive number of treatment options, and is a complaint that carries with it a high risk of complications and morbidity.   The differential diagnosis includes sciatica, kidney stone, pyelo, dka   Co morbidities that complicate the patient evaluation  hx polysubstance abuse, DM1, HLD, bipolar d/o, and ADD  Additional history obtained:  Additional history obtained from epic chart review External records from outside source obtained and reviewed including pt's father   Lab Tests:  I Ordered, and personally interpreted labs.  The pertinent results include:  cbc with hgb low at 9.9 (hgb 11.8 2 months ago); vbg nl; cmp with glucose 226; ua neg for uti  Cardiac Monitoring:  The patient was maintained on a cardiac monitor.  I personally viewed and interpreted the cardiac monitored which showed an underlying rhythm of: nsr   Medicines ordered and prescription drug management:  I ordered medication including toradol and ivfs  for pain  Reevaluation of the patient after these medicines showed that the patient improved I have reviewed the patients home medicines and have made adjustments as needed  Critical Interventions:  Pain control  Problem List / ED Course:  Hyperglycemia:  pt is not in DKA.  He has not taken his regular insulin dose yet.  He is to take his normal insulin dose when he gets home. Back pain:  likely sciatica.  He is feeling better with toradol and with robaxin.  He is stable for d/c.  Return if worse.   Reevaluation:  After the interventions noted above, I reevaluated the patient and found that they have :improved   Social Determinants of Health:  Lives at home   Dispostion:  After consideration of the diagnostic results and the patients response to treatment, I feel that the patent would benefit from discharge with outpatient f/u.          Final Clinical Impression(s) / ED Diagnoses Final diagnoses:  Hyperglycemia due to diabetes mellitus (HCC)  Bilateral sciatica    Rx / DC Orders ED Discharge Orders          Ordered    methocarbamol (ROBAXIN) 500 MG tablet   2 times daily PRN        09/18/22 1248    naproxen (NAPROSYN) 500 MG tablet  2 times daily PRN        09/18/22 1248              Jacalyn Lefevre, MD 09/18/22 1251

## 2022-09-18 NOTE — ED Notes (Signed)
Pt has insulin pump in place on L upper arm, per pt he removed his glucose monitoring device prior to EMS arriving d/t it being expired, pt states, "My pump gives me insulin throughout the day and I put in how many carbs that I eat. This RN clarified that this RN would need to check the pts blood sugar with a fingerstick. Pt verbalizes understanding

## 2022-09-18 NOTE — ED Triage Notes (Signed)
Pt c/o back pain over one month  Pt brought in by [facility]   Pt states he started with severe back pain that is radiating down his back that started this am

## 2022-09-18 NOTE — ED Notes (Signed)
Diabetes Care Coordinator, Lorenda Peck, notified re: pt with an insulin pump without a monitoring device, per conversation CBG monitoring is recommended to be completed before meals and if the pt is admitted to follow the diabetes pump order set, disposition has not been set at this time, will continue to monitor the pt

## 2022-10-09 ENCOUNTER — Encounter (HOSPITAL_COMMUNITY): Payer: Self-pay | Admitting: *Deleted

## 2022-10-09 ENCOUNTER — Observation Stay (HOSPITAL_COMMUNITY)
Admission: EM | Admit: 2022-10-09 | Discharge: 2022-10-11 | Disposition: A | Discharge status: Home / Self Care | Payer: No Typology Code available for payment source | Attending: Internal Medicine | Admitting: Internal Medicine

## 2022-10-09 ENCOUNTER — Other Ambulatory Visit: Payer: Self-pay

## 2022-10-09 DIAGNOSIS — F309 Manic episode, unspecified: Secondary | ICD-10-CM | POA: Diagnosis present

## 2022-10-09 DIAGNOSIS — E109 Type 1 diabetes mellitus without complications: Secondary | ICD-10-CM | POA: Diagnosis present

## 2022-10-09 DIAGNOSIS — Z79899 Other long term (current) drug therapy: Secondary | ICD-10-CM | POA: Insufficient documentation

## 2022-10-09 DIAGNOSIS — Z794 Long term (current) use of insulin: Secondary | ICD-10-CM | POA: Insufficient documentation

## 2022-10-09 DIAGNOSIS — E1065 Type 1 diabetes mellitus with hyperglycemia: Secondary | ICD-10-CM | POA: Diagnosis not present

## 2022-10-09 DIAGNOSIS — F1120 Opioid dependence, uncomplicated: Secondary | ICD-10-CM | POA: Diagnosis present

## 2022-10-09 DIAGNOSIS — Z720 Tobacco use: Secondary | ICD-10-CM | POA: Diagnosis present

## 2022-10-09 DIAGNOSIS — N179 Acute kidney failure, unspecified: Secondary | ICD-10-CM | POA: Diagnosis not present

## 2022-10-09 DIAGNOSIS — F17210 Nicotine dependence, cigarettes, uncomplicated: Secondary | ICD-10-CM | POA: Diagnosis not present

## 2022-10-09 DIAGNOSIS — E785 Hyperlipidemia, unspecified: Secondary | ICD-10-CM | POA: Diagnosis present

## 2022-10-09 DIAGNOSIS — E1040 Type 1 diabetes mellitus with diabetic neuropathy, unspecified: Secondary | ICD-10-CM | POA: Diagnosis not present

## 2022-10-09 DIAGNOSIS — D649 Anemia, unspecified: Secondary | ICD-10-CM | POA: Diagnosis not present

## 2022-10-09 DIAGNOSIS — D72829 Elevated white blood cell count, unspecified: Secondary | ICD-10-CM | POA: Diagnosis present

## 2022-10-09 DIAGNOSIS — F3010 Manic episode without psychotic symptoms, unspecified: Secondary | ICD-10-CM

## 2022-10-09 DIAGNOSIS — F332 Major depressive disorder, recurrent severe without psychotic features: Secondary | ICD-10-CM | POA: Diagnosis present

## 2022-10-09 DIAGNOSIS — G929 Unspecified toxic encephalopathy: Secondary | ICD-10-CM | POA: Diagnosis not present

## 2022-10-09 DIAGNOSIS — E1121 Type 2 diabetes mellitus with diabetic nephropathy: Secondary | ICD-10-CM | POA: Diagnosis present

## 2022-10-09 DIAGNOSIS — F411 Generalized anxiety disorder: Secondary | ICD-10-CM | POA: Diagnosis present

## 2022-10-09 LAB — FOLATE: Folate: 14.9 ng/mL (ref >5.9)

## 2022-10-09 LAB — COMPREHENSIVE METABOLIC PANEL
ALT: 46 U/L — ABNORMAL HIGH (ref 0–44)
AST: 51 U/L — ABNORMAL HIGH (ref 15–41)
Albumin: 3.8 g/dL (ref 3.5–5.0)
Alkaline Phosphatase: 93 U/L (ref 38–126)
Anion gap: 9 (ref 5–15)
BUN: 21 mg/dL — ABNORMAL HIGH (ref 6–20)
CO2: 24 mmol/L (ref 22–32)
Calcium: 8.9 mg/dL (ref 8.9–10.3)
Chloride: 105 mmol/L (ref 98–111)
Creatinine, Ser: 1.52 mg/dL — ABNORMAL HIGH (ref 0.61–1.24)
GFR, Estimated: 58 mL/min — ABNORMAL LOW (ref >60)
Glucose, Bld: 202 mg/dL — ABNORMAL HIGH (ref 70–99)
Potassium: 3.9 mmol/L (ref 3.5–5.1)
Sodium: 138 mmol/L (ref 135–145)
Total Bilirubin: 0.6 mg/dL (ref 0.3–1.2)
Total Protein: 7.6 g/dL (ref 6.5–8.1)

## 2022-10-09 LAB — URINALYSIS, ROUTINE W REFLEX MICROSCOPIC
Bacteria, UA: NONE SEEN
Bilirubin Urine: NEGATIVE
Cellular Cast, UA: 1
Glucose, UA: >=500 mg/dL — AB
Hgb urine dipstick: NEGATIVE
Ketones, ur: 5 mg/dL — AB
Leukocytes,Ua: NEGATIVE
Nitrite: NEGATIVE
Protein, ur: 100 mg/dL — AB
Specific Gravity, Urine: 1.02 (ref 1.005–1.030)
pH: 5 (ref 5.0–8.0)

## 2022-10-09 LAB — CBC WITH DIFFERENTIAL/PLATELET
Abs Immature Granulocytes: 0.05 10*3/uL (ref 0.00–0.07)
Basophils Absolute: 0.1 10*3/uL (ref 0.0–0.1)
Basophils Relative: 1 %
Eosinophils Absolute: 0.2 10*3/uL (ref 0.0–0.5)
Eosinophils Relative: 1 %
HCT: 29 % — ABNORMAL LOW (ref 39.0–52.0)
Hemoglobin: 9.3 g/dL — ABNORMAL LOW (ref 13.0–17.0)
Immature Granulocytes: 0 %
Lymphocytes Relative: 16 %
Lymphs Abs: 1.9 10*3/uL (ref 0.7–4.0)
MCH: 28.7 pg (ref 26.0–34.0)
MCHC: 32.1 g/dL (ref 30.0–36.0)
MCV: 89.5 fL (ref 80.0–100.0)
Monocytes Absolute: 0.8 10*3/uL (ref 0.1–1.0)
Monocytes Relative: 7 %
Neutro Abs: 9.2 10*3/uL — ABNORMAL HIGH (ref 1.7–7.7)
Neutrophils Relative %: 75 %
Platelets: 384 10*3/uL (ref 150–400)
RBC: 3.24 MIL/uL — ABNORMAL LOW (ref 4.22–5.81)
RDW: 14 % (ref 11.5–15.5)
WBC: 12.2 10*3/uL — ABNORMAL HIGH (ref 4.0–10.5)
nRBC: 0 % (ref 0.0–0.2)

## 2022-10-09 LAB — ACETAMINOPHEN LEVEL: Acetaminophen (Tylenol), Serum: <10 ug/mL — ABNORMAL LOW (ref 10–30)

## 2022-10-09 LAB — GLUCOSE, CAPILLARY
Glucose-Capillary: 245 mg/dL — ABNORMAL HIGH (ref 70–99)
Glucose-Capillary: 129 mg/dL — ABNORMAL HIGH (ref 70–99)
Glucose-Capillary: 23 mg/dL — CL (ref 70–99)
Glucose-Capillary: 26 mg/dL — CL (ref 70–99)

## 2022-10-09 LAB — RETICULOCYTES
Immature Retic Fract: 9.1 % (ref 2.3–15.9)
RBC.: 3.1 MIL/uL — ABNORMAL LOW (ref 4.22–5.81)
Retic Count, Absolute: 29.1 10*3/uL (ref 19.0–186.0)
Retic Ct Pct: 0.9 % (ref 0.4–3.1)

## 2022-10-09 LAB — SALICYLATE LEVEL: Salicylate Lvl: <7 mg/dL — ABNORMAL LOW (ref 7.0–30.0)

## 2022-10-09 LAB — VITAMIN B12: Vitamin B-12: 293 pg/mL (ref 180–914)

## 2022-10-09 LAB — CBG MONITORING, ED
Glucose-Capillary: 41 mg/dL — CL (ref 70–99)
Glucose-Capillary: 257 mg/dL — ABNORMAL HIGH (ref 70–99)

## 2022-10-09 LAB — CREATININE, URINE, RANDOM: Creatinine, Urine: 136.38 mg/dL

## 2022-10-09 LAB — IRON AND TIBC
Iron: 34 ug/dL — ABNORMAL LOW (ref 45–182)
Saturation Ratios: 11 % — ABNORMAL LOW (ref 17.9–39.5)
TIBC: 317 ug/dL (ref 250–450)
UIBC: 283 ug/dL

## 2022-10-09 LAB — RAPID URINE DRUG SCREEN, HOSP PERFORMED
Amphetamines: POSITIVE — AB
Barbiturates: NOT DETECTED
Benzodiazepines: NOT DETECTED
Cocaine: NOT DETECTED
Opiates: NOT DETECTED
Tetrahydrocannabinol: NOT DETECTED

## 2022-10-09 LAB — SODIUM, URINE, RANDOM: Sodium, Ur: 43 mmol/L

## 2022-10-09 LAB — MAGNESIUM: Magnesium: 2.1 mg/dL (ref 1.7–2.4)

## 2022-10-09 LAB — ETHANOL: Alcohol, Ethyl (B): <10 mg/dL (ref <10)

## 2022-10-09 LAB — CK: Total CK: 1454 U/L — ABNORMAL HIGH (ref 49–397)

## 2022-10-09 LAB — FERRITIN: Ferritin: 63 ng/mL (ref 24–336)

## 2022-10-09 MED ORDER — ONDANSETRON HCL 4 MG/2ML IJ SOLN: 4.0000 mg | Freq: Four times a day (QID) | INTRAMUSCULAR | Status: DC | PRN

## 2022-10-09 MED ORDER — INSULIN ASPART 100 UNIT/ML IJ SOLN
0.0000 [IU] | Freq: Three times a day (TID) | INTRAMUSCULAR | Status: DC
  Administered 2022-10-09: 3 [IU] via SUBCUTANEOUS
  Administered 2022-10-10: 5 [IU] via SUBCUTANEOUS
  Administered 2022-10-10: 3 [IU] via SUBCUTANEOUS
  Administered 2022-10-10: 2 [IU] via SUBCUTANEOUS
  Administered 2022-10-11: 9 [IU] via SUBCUTANEOUS

## 2022-10-09 MED ORDER — LISDEXAMFETAMINE DIMESYLATE 50 MG PO CAPS
70.0000 mg | ORAL_CAPSULE | Freq: Every day | ORAL | Status: DC
  Administered 2022-10-10: 70 mg via ORAL
  Filled 2022-10-09: qty 2

## 2022-10-09 MED ORDER — INSULIN DETEMIR 100 UNIT/ML ~~LOC~~ SOLN
16.0000 [IU] | Freq: Every day | SUBCUTANEOUS | Status: DC
  Administered 2022-10-09 – 2022-10-10 (×2): 16 [IU] via SUBCUTANEOUS
  Filled 2022-10-09 (×3): qty 0.16

## 2022-10-09 MED ORDER — LACTATED RINGERS IV BOLUS
1000.0000 mL | Freq: Once | INTRAVENOUS | Status: AC
  Administered 2022-10-09: 1000 mL via INTRAVENOUS

## 2022-10-09 MED ORDER — ACETAMINOPHEN 325 MG PO TABS
650.0000 mg | ORAL_TABLET | Freq: Four times a day (QID) | ORAL | Status: DC | PRN
  Administered 2022-10-10 – 2022-10-11 (×2): 650 mg via ORAL
  Filled 2022-10-09 (×2): qty 2

## 2022-10-09 MED ORDER — ONDANSETRON HCL 4 MG PO TABS: 4.0000 mg | ORAL_TABLET | Freq: Four times a day (QID) | ORAL | Status: DC | PRN

## 2022-10-09 MED ORDER — RISPERIDONE 1 MG PO TABS
1.0000 mg | ORAL_TABLET | Freq: Every day | ORAL | Status: DC
  Filled 2022-10-09: qty 1

## 2022-10-09 MED ORDER — SODIUM CHLORIDE 0.9 % IV SOLN: INTRAVENOUS | Status: DC

## 2022-10-09 MED ORDER — HEPARIN SODIUM (PORCINE) 5000 UNIT/ML IJ SOLN: 5000.0000 [IU] | Freq: Three times a day (TID) | INTRAMUSCULAR | Status: DC

## 2022-10-09 MED ORDER — IBUPROFEN 400 MG PO TABS
400.0000 mg | ORAL_TABLET | Freq: Once | ORAL | Status: AC
  Administered 2022-10-09: 400 mg via ORAL
  Filled 2022-10-09: qty 1

## 2022-10-09 MED ORDER — ACETAMINOPHEN 650 MG RE SUPP: 650.0000 mg | Freq: Four times a day (QID) | RECTAL | Status: DC | PRN

## 2022-10-09 MED ORDER — ROSUVASTATIN CALCIUM 10 MG PO TABS
5.0000 mg | ORAL_TABLET | Freq: Every day | ORAL | Status: DC
  Filled 2022-10-09: qty 1

## 2022-10-09 MED ORDER — HALOPERIDOL LACTATE 5 MG/ML IJ SOLN: 2.0000 mg | Freq: Four times a day (QID) | INTRAMUSCULAR | Status: DC | PRN

## 2022-10-09 MED ORDER — INSULIN ASPART 100 UNIT/ML IJ SOLN
0.0000 [IU] | Freq: Every day | INTRAMUSCULAR | Status: DC
  Administered 2022-10-10: 3 [IU] via SUBCUTANEOUS

## 2022-10-09 NOTE — Progress Notes (Signed)
Nurse tech came to tell me that patient's blood sugar was 26. He is on the phone talking but he appears to have a lot of energy while in the bed. He is constantly moving. His pupils dilated. Orange juice, fruit cup, and crackers with peanut butter given. He showed me he still has his omnipod to his right abdomen and dexcom to his left abdomen but stated both are not currently working.

## 2022-10-09 NOTE — H&P (Signed)
History and Physical    Tyler Mann MWU:[id number] DOB: 1981/06/28 DOA: 10/09/2022  PCP: Benita Stabile, MD   Patient coming from: Home  Chief Complaint: Hallucinations/AMS, AKI  HPI: Tyler Mann is a 41 y.o. male with medical history significant for type 1 diabetes, opiate dependence, tobacco abuse, diabetic nephropathy, polysubstance abuse, depression, neuropathy, and bipolar disorder who presented to the ED with concerns for hallucinations and altered mentation that has been intermittent over the past 3 months.  Family members are concerned about ongoing methamphetamine use and he admitted that he would like to get some help.  According to family, he does not appear to be a threat to himself or others, but has occasionally made some concerning statements.   ED Course: Stable vital signs noted and leukocytosis 12,200, hemoglobin 9.3, creatinine 1.52 with baseline near 0.9.  Glucose slightly above 200.  UDS positive for amphetamines.  Review of Systems: Could not be obtained given current condition  Past Medical History:  Diagnosis Date   ADD (attention deficit disorder)    Bipolar disorder (HCC)    Chronic back pain    Diabetes mellitus    type 1   Narcotic addiction (HCC)    Hx IV heroin abuse   Neuropathy     Past Surgical History:  Procedure Laterality Date   INCISION AND DRAINAGE     MOUTH SURGERY       reports that he has been smoking cigarettes. He has a 17.00 pack-year smoking history. He uses smokeless tobacco. He reports current alcohol use. He reports that he does not currently use drugs after having used the following drugs: Methamphetamines and Heroin.  Allergies  Allergen Reactions   Trazodone And Nefazodone Other (See Comments)    Hallucinations and nightmares    History reviewed. No pertinent family history.  Prior to Admission medications   Medication Sig Start Date End Date Taking? Authorizing Provider  cyclobenzaprine (FLEXERIL) 10 MG tablet  Take 10 mg by mouth 3 (three) times daily as needed for muscle spasms.    [provider]  insulin detemir (LEVEMIR) 100 UNIT/ML injection Inject 0.14 mLs (14 Units total) into the skin at bedtime. For diabetes management 05/18/22 08/16/22  Pokhrel, Rebekah Chesterfield, MD  lamoTRIgine (LAMICTAL) 25 MG tablet Take 1 tablet (25 mg total) by mouth daily for 14 days, THEN 2 tablets (50 mg total) daily for 14 days, THEN 4 tablets (100 mg total) daily for 14 days, THEN 8 tablets (200 mg total) daily. 05/18/22 07/29/22  Pokhrel, Rebekah Chesterfield, MD  levocetirizine (XYZAL) 5 MG tablet Take 5 mg by mouth at bedtime. 02/19/22   [provider]  methocarbamol (ROBAXIN) 500 MG tablet Take 1 tablet (500 mg total) by mouth 2 (two) times daily as needed for muscle spasms. 09/18/22   Jacalyn Lefevre, MD  naproxen (NAPROSYN) 500 MG tablet Take 1 tablet (500 mg total) by mouth 2 (two) times daily as needed for moderate pain or mild pain. 09/18/22   Jacalyn Lefevre, MD  NOVOLOG 100 UNIT/ML injection Inject 5-20 Units into the skin 3 (three) times daily. 04/20/22   [provider]  risperiDONE (RISPERDAL) 1 MG tablet Take 1 tablet (1 mg total) by mouth at bedtime. 05/18/22 05/18/23  Pokhrel, Rebekah Chesterfield, MD  rosuvastatin (CRESTOR) 5 MG tablet Take 5 mg by mouth daily. 04/10/22   [provider]  VYVANSE 70 MG capsule Take 70 mg by mouth daily. 04/05/22   [provider]    Physical Exam: Vitals:  10/09/22 1151 10/09/22 1314 10/09/22 1330 10/09/22 1400  BP: (!) 147/70 (!) 142/52 (!) 130/58 133/64  Pulse: 99 100 94 91  Resp: 20 (!) 24 18 17   Temp: 98.2 F (36.8 C)     TempSrc: Oral     SpO2: 99% 98% 93% 96%  Weight:      Height:        Constitutional: NAD, calm, comfortable, somnolent but arousable and difficult to understand Vitals:   10/09/22 1151 10/09/22 1314 10/09/22 1330 10/09/22 1400  BP: (!) 147/70 (!) 142/52 (!) 130/58 133/64  Pulse: 99 100 94 91  Resp: 20 (!) 24 18 17   Temp: 98.2 F (36.8  C)     TempSrc: Oral     SpO2: 99% 98% 93% 96%  Weight:      Height:       Eyes: lids and conjunctivae normal Neck: normal, supple Respiratory: clear to auscultation bilaterally. Normal respiratory effort. No accessory muscle use.  Cardiovascular: Regular rate and rhythm, no murmurs. Abdomen: no tenderness, no distention. Bowel sounds positive.  Musculoskeletal:  No edema. Skin: no rashes, lesions, ulcers.  Psychiatric: Flat affect  Labs on Admission: I have personally reviewed following labs and imaging studies  CBC: Recent Labs  Lab 10/09/22 1244  WBC 12.2*  NEUTROABS 9.2*  HGB 9.3*  HCT 29.0*  MCV 89.5  PLT 384   Basic Metabolic Panel: Recent Labs  Lab 10/09/22 1244  NA 138  K 3.9  CL 105  CO2 24  GLUCOSE 202*  BUN 21*  CREATININE 1.52*  CALCIUM 8.9  MG 2.1   GFR: Estimated Creatinine Clearance: 68.8 mL/min (A) (by C-G formula based on SCr of 1.52 mg/dL (H)). Liver Function Tests: Recent Labs  Lab 10/09/22 1244  AST 51*  ALT 46*  ALKPHOS 93  BILITOT 0.6  PROT 7.6  ALBUMIN 3.8   No results for input(s): "LIPASE", "AMYLASE" in the last 168 hours. No results for input(s): "AMMONIA" in the last 168 hours. Coagulation Profile: No results for input(s): "INR", "PROTIME" in the last 168 hours. Cardiac Enzymes: Recent Labs  Lab 10/09/22 1241  CKTOTAL 1,454*   BNP (last 3 results) No results for input(s): "PROBNP" in the last 8760 hours. HbA1C: No results for input(s): "HGBA1C" in the last 72 hours. CBG: Recent Labs  Lab 10/09/22 1151 10/09/22 1311  GLUCAP 41* 257*   Lipid Profile: No results for input(s): "CHOL", "HDL", "LDLCALC", "TRIG", "CHOLHDL", "LDLDIRECT" in the last 72 hours. Thyroid Function Tests: No results for input(s): "TSH", "T4TOTAL", "FREET4", "T3FREE", "THYROIDAB" in the last 72 hours. Anemia Panel: No results for input(s): "VITAMINB12", "FOLATE", "FERRITIN", "TIBC", "IRON", "RETICCTPCT" in the last 72 hours. Urine  analysis:    Component Value Date/Time   COLORURINE YELLOW 10/09/2022 1231   APPEARANCEUR CLEAR 10/09/2022 1231   LABSPEC 1.020 10/09/2022 1231   PHURINE 5.0 10/09/2022 1231   GLUCOSEU >=500 (A) 10/09/2022 1231   HGBUR NEGATIVE 10/09/2022 1231   BILIRUBINUR NEGATIVE 10/09/2022 1231   KETONESUR 5 (A) 10/09/2022 1231   PROTEINUR 100 (A) 10/09/2022 1231   UROBILINOGEN 0.2 12/26/2014 0119   NITRITE NEGATIVE 10/09/2022 1231   LEUKOCYTESUR NEGATIVE 10/09/2022 1231    Radiological Exams on Admission: No results found.  EKG: Independently reviewed. SR 99bpm.  Assessment/Plan Active Problems:   Opiate dependence (HCC)   Tobacco abuse   Diabetic nephropathy (HCC)   Diabetes mellitus type 1 (HCC)   Hyperlipidemia   Leukocytosis   Generalized anxiety disorder   Major depressive disorder,  recurrent, severe without psychotic features (HCC)   AKI (acute kidney injury) (HCC)    Acute toxic encephalopathy in the setting of bipolar disorder -Continue home psychiatric medications -TTS evaluation once stabilized -Continue close monitoring -May require sitter overnight, but currently more somnolent -Haldol as needed for agitation  AKI -Continue IV fluid and repeat a.m. labs -Avoid nephrotoxic agents -Strict I's and O's -Urine electrolytes -Consider renal ultrasound in a.m. as needed  Anemia -Check anemia panel -No overt bleeding, follow CBC  Type 1 diabetes with hyperglycemia -Carb modified diet -Semglee 16 units -NovoLog 3 times daily and SSI  Mild leukocytosis -Likely reactive, monitor CBC  Polysubstance abuse -UDS positive for methamphetamines -TOC consultation for substance abuse counseling  Tobacco abuse -Counseled on cessation  Anxiety/depression -Continue home medications -TTS evaluation once medically stable to assess for SI/HI  ADD -Continue Vyvanse   DVT prophylaxis: Heparin Code Status: Full Family Communication: None at bedside Disposition Plan:  Admit for treatment of AKI Consults called: None Admission status: Observation, MedSurg  Severity of Illness: The appropriate patient status for this patient is OBSERVATION. Observation status is judged to be reasonable and necessary in order to provide the required intensity of service to ensure the patient's safety. The patient's presenting symptoms, physical exam findings, and initial radiographic and laboratory data in the context of their medical condition is felt to place them at decreased risk for further clinical deterioration. Furthermore, it is anticipated that the patient will be medically stable for discharge from the hospital within 2 midnights of admission.    Lachae Hohler D Sherryll Burger DO [facility]  If 7PM-7AM, please contact night-coverage [email or web address]  10/09/2022, 2:56 PM

## 2022-10-09 NOTE — Inpatient Diabetes Management (Addendum)
Inpatient Diabetes Program Recommendations  AACE/ADA: New Consensus Statement on Inpatient Glycemic Control (2015)  Target Ranges:  Prepandial:   less than 140 mg/dL      Peak postprandial:   less than 180 mg/dL (1-2 hours)      Critically ill patients:  140 - 180 mg/dL   Lab Results  Component Value Date   GLUCAP 257 (H) 10/09/2022   HGBA1C 10.4 (H) 05/15/2022    Review of Glycemic Control  Latest Reference Range & Units 10/09/22 11:51 10/09/22 13:11  Glucose-Capillary 70 - 99 mg/dL 41 (LL) 798 (H)  (LL): Data is critically low (H): Data is abnormally high  Diabetes history: DM1(does not make insulin.  Needs correction, basal and meal coverage)  Outpatient Diabetes medications:  Omnipod/Dexcom (has not been able to purchase in the past because of insurance issues)  PCP-Zack Margo Aye (last visit was 08/16/22)  Current orders for Inpatient glycemic control: None  Inpatient Diabetes Program Recommendations:    Patient has T1DM.  Supposed to be using an Omnipod insulin pump and Dexcom.  PCP os [facility] in [place].  Last office visit was 08/16/22.  No insulin pump settings in that note.  He is in ED wanting help getting off meth.  If admitted please consider:  Semglee 16 units QD (0.2 units x 79.4 kg) Novolog 0-9 units TID and 0-5 units QHS Novolog 3 units TID with meals if consumes at least 50%  Will continue to follow while inpatient.  Thank you, Dulce Sellar, MSN, CDCES Diabetes Coordinator Inpatient Diabetes Program [phone] (team pager from 8a-5p)

## 2022-10-09 NOTE — ED Triage Notes (Signed)
Pt states he wants help to get off meth; pt states his last use was Sunday  Pt states his monitor is showing his cbg to be 52 but he states he feels like it is more low
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
## 2022-10-10 DIAGNOSIS — F319 Bipolar disorder, unspecified: Secondary | ICD-10-CM | POA: Diagnosis not present

## 2022-10-10 DIAGNOSIS — F332 Major depressive disorder, recurrent severe without psychotic features: Secondary | ICD-10-CM | POA: Diagnosis not present

## 2022-10-10 DIAGNOSIS — F3010 Manic episode without psychotic symptoms, unspecified: Secondary | ICD-10-CM | POA: Diagnosis not present

## 2022-10-10 DIAGNOSIS — Z720 Tobacco use: Secondary | ICD-10-CM | POA: Diagnosis not present

## 2022-10-10 DIAGNOSIS — N179 Acute kidney failure, unspecified: Secondary | ICD-10-CM | POA: Diagnosis not present

## 2022-10-10 DIAGNOSIS — E103299 Type 1 diabetes mellitus with mild nonproliferative diabetic retinopathy without macular edema, unspecified eye: Secondary | ICD-10-CM | POA: Diagnosis not present

## 2022-10-10 LAB — CBC
HCT: 28.5 % — ABNORMAL LOW (ref 39.0–52.0)
Hemoglobin: 9 g/dL — ABNORMAL LOW (ref 13.0–17.0)
MCH: 28.8 pg (ref 26.0–34.0)
MCHC: 31.6 g/dL (ref 30.0–36.0)
MCV: 91.1 fL (ref 80.0–100.0)
Platelets: 391 10*3/uL (ref 150–400)
RBC: 3.13 MIL/uL — ABNORMAL LOW (ref 4.22–5.81)
RDW: 14.3 % (ref 11.5–15.5)
WBC: 9.6 10*3/uL (ref 4.0–10.5)
nRBC: 0 % (ref 0.0–0.2)

## 2022-10-10 LAB — BASIC METABOLIC PANEL
Anion gap: 6 (ref 5–15)
BUN: 15 mg/dL (ref 6–20)
CO2: 24 mmol/L (ref 22–32)
Calcium: 8.6 mg/dL — ABNORMAL LOW (ref 8.9–10.3)
Chloride: 107 mmol/L (ref 98–111)
Creatinine, Ser: 1.05 mg/dL (ref 0.61–1.24)
GFR, Estimated: >60 mL/min (ref >60)
Glucose, Bld: 236 mg/dL — ABNORMAL HIGH (ref 70–99)
Potassium: 4.2 mmol/L (ref 3.5–5.1)
Sodium: 137 mmol/L (ref 135–145)

## 2022-10-10 LAB — GLUCOSE, CAPILLARY
Glucose-Capillary: 171 mg/dL — ABNORMAL HIGH (ref 70–99)
Glucose-Capillary: 244 mg/dL — ABNORMAL HIGH (ref 70–99)
Glucose-Capillary: 269 mg/dL — ABNORMAL HIGH (ref 70–99)
Glucose-Capillary: 261 mg/dL — ABNORMAL HIGH (ref 70–99)

## 2022-10-10 LAB — MAGNESIUM: Magnesium: 2.2 mg/dL (ref 1.7–2.4)

## 2022-10-10 MED ORDER — LAMOTRIGINE 100 MG PO TABS
200.0000 mg | ORAL_TABLET | Freq: Every day | ORAL | Status: DC
  Administered 2022-10-10 – 2022-10-11 (×2): 200 mg via ORAL
  Filled 2022-10-10 (×2): qty 2

## 2022-10-10 MED ORDER — CYCLOBENZAPRINE HCL 10 MG PO TABS
10.0000 mg | ORAL_TABLET | Freq: Three times a day (TID) | ORAL | Status: DC | PRN
  Administered 2022-10-10 – 2022-10-11 (×3): 10 mg via ORAL
  Filled 2022-10-10 (×3): qty 1

## 2022-10-10 MED ORDER — ZIPRASIDONE MESYLATE 20 MG IM SOLR: 20.0000 mg | INTRAMUSCULAR | Status: DC | PRN

## 2022-10-10 MED ORDER — LORAZEPAM 1 MG PO TABS
1.0000 mg | ORAL_TABLET | ORAL | Status: AC | PRN
  Administered 2022-10-10: 1 mg via ORAL
  Filled 2022-10-10: qty 1

## 2022-10-10 MED ORDER — BACITRACIN-NEOMYCIN-POLYMYXIN OINTMENT TUBE
TOPICAL_OINTMENT | CUTANEOUS | Status: DC | PRN
  Filled 2022-10-10: qty 14.17

## 2022-10-10 MED ORDER — CYANOCOBALAMIN 1000 MCG/ML IJ SOLN
1000.0000 ug | Freq: Once | INTRAMUSCULAR | Status: AC
  Administered 2022-10-10: 1000 ug via INTRAMUSCULAR
  Filled 2022-10-10: qty 1

## 2022-10-10 MED ORDER — RISPERIDONE 1 MG PO TABS
2.0000 mg | ORAL_TABLET | Freq: Three times a day (TID) | ORAL | Status: DC | PRN
  Filled 2022-10-10: qty 2

## 2022-10-10 NOTE — Progress Notes (Signed)
  Transition of Care [facility]) Screening Note   Patient Details  Name: Tyler Mann Date of Birth: 1981/09/13   Transition of Care [facility]) CM/SW Contact:    Villa Herb, LCSWA Phone Number: 10/10/2022, 1:41 PM  Pt has active TTS consult. MD requested that CSW provide pt with substance use resources. CSW added outpatient and residential substance use resources to pts AVS.   Transition of Care Department [facility]) has reviewed patient and no TOC needs have been identified at this time. We will continue to monitor patient advancement through interdisciplinary progression rounds. If new patient transition needs arise, please place a TOC consult.
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
## 2022-10-10 NOTE — Progress Notes (Signed)
Patient pleasant but very active and energetic. He is constantly hungry and thirsty, only complaints are back pain and wishing to go home.
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
## 2022-10-11 DIAGNOSIS — F19159 Other psychoactive substance abuse with psychoactive substance-induced psychotic disorder, unspecified: Secondary | ICD-10-CM | POA: Diagnosis not present

## 2022-10-11 DIAGNOSIS — N179 Acute kidney failure, unspecified: Secondary | ICD-10-CM | POA: Diagnosis not present

## 2022-10-11 DIAGNOSIS — F3010 Manic episode without psychotic symptoms, unspecified: Secondary | ICD-10-CM | POA: Diagnosis not present

## 2022-10-11 DIAGNOSIS — F332 Major depressive disorder, recurrent severe without psychotic features: Secondary | ICD-10-CM

## 2022-10-11 LAB — BASIC METABOLIC PANEL
Anion gap: 4 — ABNORMAL LOW (ref 5–15)
BUN: 9 mg/dL (ref 6–20)
CO2: 25 mmol/L (ref 22–32)
Calcium: 8.4 mg/dL — ABNORMAL LOW (ref 8.9–10.3)
Chloride: 111 mmol/L (ref 98–111)
Creatinine, Ser: 0.9 mg/dL (ref 0.61–1.24)
GFR, Estimated: >60 mL/min (ref >60)
Glucose, Bld: 128 mg/dL — ABNORMAL HIGH (ref 70–99)
Potassium: 4.1 mmol/L (ref 3.5–5.1)
Sodium: 140 mmol/L (ref 135–145)

## 2022-10-11 LAB — CBC
HCT: 29.5 % — ABNORMAL LOW (ref 39.0–52.0)
Hemoglobin: 9.2 g/dL — ABNORMAL LOW (ref 13.0–17.0)
MCH: 28.3 pg (ref 26.0–34.0)
MCHC: 31.2 g/dL (ref 30.0–36.0)
MCV: 90.8 fL (ref 80.0–100.0)
Platelets: 402 10*3/uL — ABNORMAL HIGH (ref 150–400)
RBC: 3.25 MIL/uL — ABNORMAL LOW (ref 4.22–5.81)
RDW: 13.7 % (ref 11.5–15.5)
WBC: 9.8 10*3/uL (ref 4.0–10.5)
nRBC: 0 % (ref 0.0–0.2)

## 2022-10-11 LAB — GLUCOSE, CAPILLARY
Glucose-Capillary: 82 mg/dL (ref 70–99)
Glucose-Capillary: 375 mg/dL — ABNORMAL HIGH (ref 70–99)

## 2022-10-11 LAB — CK: Total CK: 413 U/L — ABNORMAL HIGH (ref 49–397)

## 2022-10-11 MED ORDER — PANTOPRAZOLE SODIUM 40 MG PO TBEC: 40.0000 mg | DELAYED_RELEASE_TABLET | Freq: Every day | ORAL | 0 refills | Status: DC

## 2022-10-11 MED ORDER — RISPERIDONE 1 MG PO TABS: 1.0000 mg | ORAL_TABLET | Freq: Every day | ORAL | 0 refills | Status: DC

## 2022-10-11 NOTE — Progress Notes (Signed)
Patient stable and ready for discharge home. Patient was cleared from [facility]. IV removed. Patient dressed self. Writer went over discharge paperwork with patient and spoke with his mom and dad via cell phone and all verbalized understanding. Patient is to f/u with outpatient [facility] on 10/15/22. Patient family friend picked him up with his dog, Clinical research associate transported patient to the truck for discharge.

## 2022-10-11 NOTE — Plan of Care (Signed)
Problem: Education: Goal: Knowledge of General Education information will improve Description: Including pain rating scale, medication(s)/side effects and non-pharmacologic comfort measures Outcome: Adequate for Discharge   Problem: Health Behavior/Discharge Planning: Goal: Ability to manage health-related needs will improve Outcome: Adequate for Discharge   Problem: Clinical Measurements: Goal: Ability to maintain clinical measurements within normal limits will improve Outcome: Adequate for Discharge Goal: Will remain free from infection Outcome: Adequate for Discharge Goal: Diagnostic test results will improve Outcome: Adequate for Discharge Goal: Respiratory complications will improve Outcome: Adequate for Discharge Goal: Cardiovascular complication will be avoided Outcome: Adequate for Discharge   Problem: Activity: Goal: Risk for activity intolerance will decrease Outcome: Adequate for Discharge   Problem: Nutrition: Goal: Adequate nutrition will be maintained Outcome: Adequate for Discharge   Problem: Coping: Goal: Level of anxiety will decrease Outcome: Adequate for Discharge   Problem: Elimination: Goal: Will not experience complications related to bowel motility Outcome: Adequate for Discharge Goal: Will not experience complications related to urinary retention Outcome: Adequate for Discharge   Problem: Pain Managment: Goal: General experience of comfort will improve Outcome: Adequate for Discharge   Problem: Safety: Goal: Ability to remain free from injury will improve Outcome: Adequate for Discharge   Problem: Skin Integrity: Goal: Risk for impaired skin integrity will decrease Outcome: Adequate for Discharge   Problem: Education: Goal: Ability to describe self-care measures that may prevent or decrease complications (Diabetes Survival Skills Education) will improve Outcome: Adequate for Discharge Goal: Individualized Educational Video(s) Outcome:  Adequate for Discharge   Problem: Coping: Goal: Ability to adjust to condition or change in health will improve Outcome: Adequate for Discharge   Problem: Fluid Volume: Goal: Ability to maintain a balanced intake and output will improve Outcome: Adequate for Discharge   Problem: Health Behavior/Discharge Planning: Goal: Ability to identify and utilize available resources and services will improve Outcome: Adequate for Discharge Goal: Ability to manage health-related needs will improve Outcome: Adequate for Discharge   Problem: Metabolic: Goal: Ability to maintain appropriate glucose levels will improve Outcome: Adequate for Discharge   Problem: Nutritional: Goal: Maintenance of adequate nutrition will improve Outcome: Adequate for Discharge Goal: Progress toward achieving an optimal weight will improve Outcome: Adequate for Discharge   Problem: Skin Integrity: Goal: Risk for impaired skin integrity will decrease Outcome: Adequate for Discharge   Problem: Tissue Perfusion: Goal: Adequacy of tissue perfusion will improve Outcome: Adequate for Discharge   Problem: Education: Goal: Knowledge of General Education information will improve Description: Including pain rating scale, medication(s)/side effects and non-pharmacologic comfort measures Outcome: Adequate for Discharge   Problem: Health Behavior/Discharge Planning: Goal: Ability to manage health-related needs will improve Outcome: Adequate for Discharge   Problem: Clinical Measurements: Goal: Ability to maintain clinical measurements within normal limits will improve Outcome: Adequate for Discharge Goal: Will remain free from infection Outcome: Adequate for Discharge Goal: Diagnostic test results will improve Outcome: Adequate for Discharge Goal: Respiratory complications will improve Outcome: Adequate for Discharge Goal: Cardiovascular complication will be avoided Outcome: Adequate for Discharge   Problem:  Activity: Goal: Risk for activity intolerance will decrease Outcome: Adequate for Discharge   Problem: Nutrition: Goal: Adequate nutrition will be maintained Outcome: Adequate for Discharge   Problem: Coping: Goal: Level of anxiety will decrease Outcome: Adequate for Discharge   Problem: Elimination: Goal: Will not experience complications related to bowel motility Outcome: Adequate for Discharge Goal: Will not experience complications related to urinary retention Outcome: Adequate for Discharge   Problem: Pain Managment: Goal: General experience of comfort  will improve Outcome: Adequate for Discharge   Problem: Safety: Goal: Ability to remain free from injury will improve Outcome: Adequate for Discharge   Problem: Skin Integrity: Goal: Risk for impaired skin integrity will decrease Outcome: Adequate for Discharge   

## 2022-10-11 NOTE — Consult Note (Addendum)
Telepsych Consultation   Reason for Consult:  Bipolar disorder, visual hallucinations, and substance abuse   Referring Physician: Dr. Kerry Hough  Location of Patient: The patient hospital room 309 bed 1  Location of Provider: [facility]  Patient Identification: Tyler Mann  MRN:  [id number]  Principal Diagnosis: Methamphetamine use/severe with psychosis   Diagnosis:  Active Problems:   Diabetes mellitus type 1 (HCC)   Hyperlipidemia   Opiate dependence (HCC)   Tobacco abuse   Diabetic nephropathy (HCC)   Leukocytosis   Generalized anxiety disorder   Major depressive disorder, recurrent, severe without psychotic features (HCC)   AKI (acute kidney injury) (HCC)  Total Time spent with patient: 30 minutes  Subjective:   Tyler Mann is a 41 y.o. male patient admitted with concern of drug use.   HPI:  Patient presents for concern of drug use.  Medical history includes T1DM, opiate dependence, tobacco use, diabetic nephropathy, polysubstance abuse, depression, neuropathy, bipolar disorder.  His drug of choice lately has been methamphetamines.  His last use was 2 days ago.  Patient describes recent concern about a male friend's jealous boyfriend.  He has expressed this concern to others.  There is a known well (mother, father, employer) have been concerned about his mental wellbeing.  For this reason, he presents to the ED.  Patient's home medications include insulin, Vyvanse, ibuprofen, gabapentin, and Flexeril.  He states that his last insulin use was yesterday at lunchtime.  He has not taken his Vyvanse in the past 2 days.  He states that he has been sleeping normally over the past several days.  He denies any current physical symptoms.  He denies any SI, HI, or AVH.    Assessment: On assessment today via telepsych, patient is seen and examined sitting in a private room on his bed.  Appears calmer than yesterday and speech less pressured.  Chart reviewed and findings  shared with the treatment team and consult with Dr. Gasper Sells.  Alert and oriented x 4.  More focused today and able to maintain good eye contact with this provider.  Presents with euthymic mood and affect congruent.  Appears more organized and logical today.  When asked what brought him to the hospital, states, "I know being on drugs is not a good thing for me or anybody.  I came here to get treated and to get referral back to the mood treatment center for help to prevent using these drugs."  Better Insight and judgment today.  When asked for safety and crisis plans upon discharge, states, "I will be going to my dad's best friend when I leave the hospital.  My parents are in [place] and will wait for my father to return and pick me up. If I am in crisis, I can call 911 or call the police.  I also have friends who calls and check up on me.  More so, I will try to stay away from amphetamine." Labs reviewed with BUN 9, and creatinine 0.9, normal.  CBC: RBC 3.25, hemoglobin 9.2, HCT 29.5.  Patient was given vitamin B12 injection. CBG 128.  He is to follow-up with his PCP for all abnormal labs upon discharge.  He denies SI, HI, AVH.  He does not appear to be responding to internal or external stimuli.  And denies alcohol use and denies being followed by a therapist or psychiatrist.  Reports he will resume therapy starting in December 2023.  No psychotic symptoms noted during encounter.  Report sleeping  well throughout the night.  Chart review indicate patient received 1 mg of Ativan last night as needed.  Reported being safe at home.  Reports great appetite and added I know I am diabetic and need to cut down on eating and increase my fluid intake.  Denies access to firearms as he is a felon (reported yesterday).  Patient reports tobacco smoking of 1 pack/week and endorses attempting to quit smoking.  Reports no family history of mental illness however, mom has a behavior that the family has to deal with.   Past  Psychiatric History: Major depressive disorder recurrent severe without psychotic features, opiate dependence with intoxication and complication,, tobacco abuse, medication overdose, overdose of benzodiazepine, substance-induced mood disorder, and suicidal ideations.   Disposition: Based on my examination of patient, he is psych cleared and can be discharged to home when medically stable.  CSW to provide outpatient resources for therapy upon discharge.  Risk to Self:  No Risk to Others: No  Prior Inpatient Therapy:  Yes Prior Outpatient Therapy: Yes   Past Medical History:  Past Medical History:  Diagnosis Date   ADD (attention deficit disorder)    Bipolar disorder (HCC)    Chronic back pain    Diabetes mellitus    type 1   Narcotic addiction (HCC)    Hx IV heroin abuse   Neuropathy     Past Surgical History:  Procedure Laterality Date   INCISION AND DRAINAGE     MOUTH SURGERY     Family History: History reviewed. No pertinent family history.  Family Psychiatric  History: None indicated, however patient states mom has some mental issues which he does not know that deals with it.  Social History:  Social History   Substance and Sexual Activity  Alcohol Use Yes   Comment: occasionally      Social History   Substance and Sexual Activity  Drug Use Not Currently   Types: Methamphetamines, Heroin   Comment: recovering from heroin-     Social History   Socioeconomic History   Marital status: Legally Separated    Spouse name: Not on file   Number of children: Not on file   Years of education: Not on file   Highest education level: Not on file  Occupational History   Not on file  Tobacco Use   Smoking status: Every Day    Packs/day: 1.00    Years: 17.00    Total pack years: 17.00    Types: Cigarettes   Smokeless tobacco: Current  Vaping Use   Vaping Use: Never used  Substance and Sexual Activity   Alcohol use: Yes    Comment: occasionally    Drug use: Not  Currently    Types: Methamphetamines, Heroin    Comment: recovering from heroin-    Sexual activity: Yes  Other Topics Concern   Not on file  Social History Narrative   Not on file   Social Determinants of Health   Financial Resource Strain: Not on file  Food Insecurity: No Food Insecurity (10/09/2022)   Hunger Vital Sign    Worried About Running Out of Food in the Last Year: Never true    Ran Out of Food in the Last Year: Never true  Transportation Needs: No Transportation Needs (10/09/2022)   PRAPARE - Administrator, Civil Service (Medical): No    Lack of Transportation (Non-Medical): No  Physical Activity: Not on file  Stress: Not on file  Social Connections: Not on file  Additional Social History:    Allergies:   Allergies  Allergen Reactions   Trazodone And Nefazodone Other (See Comments)    Hallucinations and nightmares    Labs:  Results for orders placed or performed during the hospital encounter of 10/09/22 (from the past 48 hour(s))  CBG monitoring, ED     Status: Abnormal   Collection Time: 10/09/22 11:51 AM  Result Value Ref Range   Glucose-Capillary 41 (LL) 70 - 99 mg/dL    Comment: Glucose reference range applies only to samples taken after fasting for at least 8 hours.   Comment 1 Notify RN   Urine rapid drug screen (hosp performed)     Status: Abnormal   Collection Time: 10/09/22 12:30 PM  Result Value Ref Range   Opiates NONE DETECTED NONE DETECTED   Cocaine NONE DETECTED NONE DETECTED   Benzodiazepines NONE DETECTED NONE DETECTED   Amphetamines POSITIVE (A) NONE DETECTED   Tetrahydrocannabinol NONE DETECTED NONE DETECTED   Barbiturates NONE DETECTED NONE DETECTED    Comment: (NOTE) DRUG SCREEN FOR MEDICAL PURPOSES ONLY.  IF CONFIRMATION IS NEEDED FOR ANY PURPOSE, NOTIFY LAB WITHIN 5 DAYS.  LOWEST DETECTABLE LIMITS FOR URINE DRUG SCREEN Drug Class                     Cutoff (ng/mL) Amphetamine and metabolites     1000 Barbiturate and metabolites    200 Benzodiazepine                 200 Opiates and metabolites        300 Cocaine and metabolites        300 THC                            50 Performed at [facility], [address] [place], [place] [address]   Urinalysis, Routine w reflex microscopic Urine, Clean Catch     Status: Abnormal   Collection Time: 10/09/22 12:31 PM  Result Value Ref Range   Color, Urine YELLOW YELLOW   APPearance CLEAR CLEAR   Specific Gravity, Urine 1.020 1.005 - 1.030   pH 5.0 5.0 - 8.0   Glucose, UA >=500 (A) NEGATIVE mg/dL   Hgb urine dipstick NEGATIVE NEGATIVE   Bilirubin Urine NEGATIVE NEGATIVE   Ketones, ur 5 (A) NEGATIVE mg/dL   Protein, ur 427 (A) NEGATIVE mg/dL   Nitrite NEGATIVE NEGATIVE   Leukocytes,Ua NEGATIVE NEGATIVE   RBC / HPF 0-5 0 - 5 RBC/hpf   WBC, UA 0-5 0 - 5 WBC/hpf   Bacteria, UA NONE SEEN NONE SEEN   Mucus PRESENT    Hyaline Casts, UA PRESENT    Granular Casts, UA PRESENT    Cellular Cast, UA 1     Comment: Performed at [facility], [address], [place], [place] [address]  CK     Status: Abnormal   Collection Time: 10/09/22 12:41 PM  Result Value Ref Range   Total CK 1,454 (H) 49 - 397 U/L    Comment: Performed at [facility], [address], [place], [place] [address]  Comprehensive metabolic panel     Status: Abnormal   Collection Time: 10/09/22 12:44 PM  Result Value Ref Range   Sodium 138 135 - 145 mmol/L   Potassium 3.9 3.5 - 5.1 mmol/L   Chloride 105 98 - 111 mmol/L   CO2 24 22 - 32 mmol/L   Glucose, Bld 202 (H) 70 -  99 mg/dL    Comment: Glucose reference range applies only to samples taken after fasting for at least 8 hours.   BUN 21 (H) 6 - 20 mg/dL   Creatinine, Ser 0.981.52 (H) 0.61 - 1.24 mg/dL   Calcium 8.9 8.9 - 11.910.3 mg/dL   Total Protein 7.6 6.5 - 8.1 g/dL   Albumin 3.8 3.5 - 5.0 g/dL   AST 51 (H) 15 - 41 U/L   ALT 46 (H) 0 - 44 U/L   Alkaline Phosphatase 93 38 - 126 U/L   Total Bilirubin 0.6 0.3 - 1.2 mg/dL    GFR, Estimated 58 (L) >60 mL/min    Comment: (NOTE) Calculated using the CKD-EPI Creatinine Equation (2021)    Anion gap 9 5 - 15    Comment: Performed at [facility], [address] [place], [place] [address]  Ethanol     Status: None   Collection Time: 10/09/22 12:44 PM  Result Value Ref Range   Alcohol, Ethyl (B) <10 <10 mg/dL    Comment: (NOTE) Lowest detectable limit for serum alcohol is 10 mg/dL.  For medical purposes only. Performed at [facility], [address] [place], [place] [address]   CBC with Diff     Status: Abnormal   Collection Time: 10/09/22 12:44 PM  Result Value Ref Range   WBC 12.2 (H) 4.0 - 10.5 K/uL   RBC 3.24 (L) 4.22 - 5.81 MIL/uL   Hemoglobin 9.3 (L) 13.0 - 17.0 g/dL   HCT 30.829.0 (L) 65.739.0 - 84.652.0 %   MCV 89.5 80.0 - 100.0 fL   MCH 28.7 26.0 - 34.0 pg   MCHC 32.1 30.0 - 36.0 g/dL   RDW 96.214.0 95.211.5 - 84.115.5 %   Platelets 384 150 - 400 K/uL   nRBC 0.0 0.0 - 0.2 %   Neutrophils Relative % 75 %   Neutro Abs 9.2 (H) 1.7 - 7.7 K/uL   Lymphocytes Relative 16 %   Lymphs Abs 1.9 0.7 - 4.0 K/uL   Monocytes Relative 7 %   Monocytes Absolute 0.8 0.1 - 1.0 K/uL   Eosinophils Relative 1 %   Eosinophils Absolute 0.2 0.0 - 0.5 K/uL   Basophils Relative 1 %   Basophils Absolute 0.1 0.0 - 0.1 K/uL   Immature Granulocytes 0 %   Abs Immature Granulocytes 0.05 0.00 - 0.07 K/uL    Comment: Performed at [facility], [address] [place], [place] [address]  Magnesium     Status: None   Collection Time: 10/09/22 12:44 PM  Result Value Ref Range   Magnesium 2.1 1.7 - 2.4 mg/dL    Comment: Performed at [facility], [address] [place], [place] [address]  Acetaminophen level     Status: Abnormal   Collection Time: 10/09/22 12:44 PM  Result Value Ref Range   Acetaminophen (Tylenol), Serum <10 (L) 10 - 30 ug/mL    Comment: (NOTE) Therapeutic concentrations vary significantly. A range of 10-30 ug/mL  may be an effective concentration for many patients. However, some   are best treated at concentrations outside of this range. Acetaminophen concentrations >150 ug/mL at 4 hours after ingestion  and >50 ug/mL at 12 hours after ingestion are often associated with  toxic reactions.  Performed at [facility], [address] [place], [place] [address]   Salicylate level     Status: Abnormal   Collection Time: 10/09/22 12:44 PM  Result Value Ref Range   Salicylate Lvl <7.0 (L) 7.0 - 30.0 mg/dL  Comment: Performed at [facility], [address], [place], [place] [address]  CBG monitoring, ED     Status: Abnormal   Collection Time: 10/09/22  1:11 PM  Result Value Ref Range   Glucose-Capillary 257 (H) 70 - 99 mg/dL    Comment: Glucose reference range applies only to samples taken after fasting for at least 8 hours.  Sodium, urine, random     Status: None   Collection Time: 10/09/22  3:16 PM  Result Value Ref Range   Sodium, Ur 43 mmol/L    Comment: Performed at [facility], [address] [place], [place] [address]  Creatinine, urine, random     Status: None   Collection Time: 10/09/22  3:16 PM  Result Value Ref Range   Creatinine, Urine 136.38 mg/dL    Comment: Performed at [facility], [address], [place], [place] [address]  Vitamin B12     Status: None   Collection Time: 10/09/22  3:49 PM  Result Value Ref Range   Vitamin B-12 293 180 - 914 pg/mL    Comment: (NOTE) This assay is not validated for testing neonatal or myeloproliferative syndrome specimens for Vitamin B12 levels. Performed at [facility], [address], [place], [place] [address]   Folate     Status: None   Collection Time: 10/09/22  3:49 PM  Result Value Ref Range   Folate 14.9 >5.9 ng/mL    Comment: Performed at [facility], [address], [place], [place] [address]  Iron and TIBC     Status: Abnormal   Collection Time: 10/09/22  3:49 PM  Result Value Ref Range   Iron 34 (L) 45 - 182 ug/dL   TIBC 094 709 - 628 ug/dL   Saturation Ratios 11 (L) 17.9 - 39.5 %   UIBC  283 ug/dL    Comment: Performed at [facility], [address], [place], [place] [address]  Ferritin     Status: None   Collection Time: 10/09/22  3:49 PM  Result Value Ref Range   Ferritin 63 24 - 336 ng/mL    Comment: Performed at [facility], [address], [place], [place] [address]  Reticulocytes     Status: Abnormal   Collection Time: 10/09/22  3:49 PM  Result Value Ref Range   Retic Ct Pct 0.9 0.4 - 3.1 %   RBC. 3.10 (L) 4.22 - 5.81 MIL/uL   Retic Count, Absolute 29.1 19.0 - 186.0 K/uL   Immature Retic Fract 9.1 2.3 - 15.9 %    Comment: Performed at [facility], [address] [place], [place] [address]  Glucose, capillary     Status: Abnormal   Collection Time: 10/09/22  4:38 PM  Result Value Ref Range   Glucose-Capillary 245 (H) 70 - 99 mg/dL    Comment: Glucose reference range applies only to samples taken after fasting for at least 8 hours.  Glucose, capillary     Status: Abnormal   Collection Time: 10/09/22  7:31 PM  Result Value Ref Range   Glucose-Capillary 23 (LL) 70 - 99 mg/dL    Comment: Glucose reference range applies only to samples taken after fasting for at least 8 hours.   Comment 1 Notify RN   Glucose, capillary     Status: Abnormal   Collection Time: 10/09/22  7:32 PM  Result Value Ref Range   Glucose-Capillary 26 (LL) 70 - 99 mg/dL    Comment: Glucose reference range applies only to samples taken after fasting for  at least 8 hours.  Glucose, capillary     Status: Abnormal   Collection Time: 10/09/22  8:11 PM  Result Value Ref Range   Glucose-Capillary 129 (H) 70 - 99 mg/dL    Comment: Glucose reference range applies only to samples taken after fasting for at least 8 hours.  Magnesium     Status: None   Collection Time: 10/10/22  2:57 AM  Result Value Ref Range   Magnesium 2.2 1.7 - 2.4 mg/dL    Comment: Performed at [facility], [address], [place], [place] [address]  Basic metabolic panel     Status: Abnormal   Collection Time: 10/10/22  2:57  AM  Result Value Ref Range   Sodium 137 135 - 145 mmol/L   Potassium 4.2 3.5 - 5.1 mmol/L   Chloride 107 98 - 111 mmol/L   CO2 24 22 - 32 mmol/L   Glucose, Bld 236 (H) 70 - 99 mg/dL    Comment: Glucose reference range applies only to samples taken after fasting for at least 8 hours.   BUN 15 6 - 20 mg/dL   Creatinine, Ser 6.04 0.61 - 1.24 mg/dL   Calcium 8.6 (L) 8.9 - 10.3 mg/dL   GFR, Estimated >54 >09 mL/min    Comment: (NOTE) Calculated using the CKD-EPI Creatinine Equation (2021)    Anion gap 6 5 - 15    Comment: Performed at [facility], [address] [place], [place] [address]  CBC     Status: Abnormal   Collection Time: 10/10/22  2:57 AM  Result Value Ref Range   WBC 9.6 4.0 - 10.5 K/uL   RBC 3.13 (L) 4.22 - 5.81 MIL/uL   Hemoglobin 9.0 (L) 13.0 - 17.0 g/dL   HCT 47.8 (L) 29.5 - 62.1 %   MCV 91.1 80.0 - 100.0 fL   MCH 28.8 26.0 - 34.0 pg   MCHC 31.6 30.0 - 36.0 g/dL   RDW 30.8 65.7 - 84.6 %   Platelets 391 150 - 400 K/uL   nRBC 0.0 0.0 - 0.2 %    Comment: Performed at [facility], [address], [place], [place] [address]  Glucose, capillary     Status: Abnormal   Collection Time: 10/10/22  7:54 AM  Result Value Ref Range   Glucose-Capillary 171 (H) 70 - 99 mg/dL    Comment: Glucose reference range applies only to samples taken after fasting for at least 8 hours.  Glucose, capillary     Status: Abnormal   Collection Time: 10/10/22 11:12 AM  Result Value Ref Range   Glucose-Capillary 244 (H) 70 - 99 mg/dL    Comment: Glucose reference range applies only to samples taken after fasting for at least 8 hours.  Glucose, capillary     Status: Abnormal   Collection Time: 10/10/22  4:38 PM  Result Value Ref Range   Glucose-Capillary 269 (H) 70 - 99 mg/dL    Comment: Glucose reference range applies only to samples taken after fasting for at least 8 hours.   Comment 1 Notify RN    Comment 2 Document in Chart   Glucose, capillary     Status: Abnormal   Collection Time:  10/10/22  9:46 PM  Result Value Ref Range   Glucose-Capillary 261 (H) 70 - 99 mg/dL    Comment: Glucose reference range applies only to samples taken after fasting for at least 8 hours.   Comment 1 Notify RN    Comment 2 Document in Chart  CBC     Status: Abnormal   Collection Time: 10/11/22  3:29 AM  Result Value Ref Range   WBC 9.8 4.0 - 10.5 K/uL   RBC 3.25 (L) 4.22 - 5.81 MIL/uL   Hemoglobin 9.2 (L) 13.0 - 17.0 g/dL   HCT 16.1 (L) 09.6 - 04.5 %   MCV 90.8 80.0 - 100.0 fL   MCH 28.3 26.0 - 34.0 pg   MCHC 31.2 30.0 - 36.0 g/dL   RDW 40.9 81.1 - 91.4 %   Platelets 402 (H) 150 - 400 K/uL   nRBC 0.0 0.0 - 0.2 %    Comment: Performed at [facility], [address] [place], [place] [address]  Basic metabolic panel     Status: Abnormal   Collection Time: 10/11/22  3:29 AM  Result Value Ref Range   Sodium 140 135 - 145 mmol/L   Potassium 4.1 3.5 - 5.1 mmol/L   Chloride 111 98 - 111 mmol/L   CO2 25 22 - 32 mmol/L   Glucose, Bld 128 (H) 70 - 99 mg/dL    Comment: Glucose reference range applies only to samples taken after fasting for at least 8 hours.   BUN 9 6 - 20 mg/dL   Creatinine, Ser 6.21 0.61 - 1.24 mg/dL   Calcium 8.4 (L) 8.9 - 10.3 mg/dL   GFR, Estimated >30 >86 mL/min    Comment: (NOTE) Calculated using the CKD-EPI Creatinine Equation (2021)    Anion gap 4 (L) 5 - 15    Comment: Performed at [facility], [address] [place], [place] [address]  CK     Status: Abnormal   Collection Time: 10/11/22  3:29 AM  Result Value Ref Range   Total CK 413 (H) 49 - 397 U/L    Comment: Performed at [facility], [address], [place], [place] [address]  Glucose, capillary     Status: None   Collection Time: 10/11/22  7:06 AM  Result Value Ref Range   Glucose-Capillary 82 70 - 99 mg/dL    Comment: Glucose reference range applies only to samples taken after fasting for at least 8 hours.    Medications:  Current Facility-Administered Medications  Medication Dose Route  Frequency Provider Last Rate Last Admin   0.9 %  sodium chloride infusion   Intravenous Continuous Sherryll Burger, Pratik D, DO   Stopped at 10/11/22 1050   acetaminophen (TYLENOL) tablet 650 mg  650 mg Oral Q6H PRN Maurilio Lovely D, DO   650 mg at 10/11/22 0540   Or   acetaminophen (TYLENOL) suppository 650 mg  650 mg Rectal Q6H PRN Sherryll Burger, Pratik D, DO       cyclobenzaprine (FLEXERIL) tablet 10 mg  10 mg Oral TID PRN Erick Blinks, MD   10 mg at 10/11/22 0540   heparin injection 5,000 Units  5,000 Units Subcutaneous Q8H Shah, Pratik D, DO       insulin aspart (novoLOG) injection 0-5 Units  0-5 Units Subcutaneous QHS Sherryll Burger, Pratik D, DO   3 Units at 10/10/22 2208   insulin aspart (novoLOG) injection 0-9 Units  0-9 Units Subcutaneous TID WC Shah, Pratik D, DO   5 Units at 10/10/22 1721   insulin detemir (LEVEMIR) injection 16 Units  16 Units Subcutaneous QHS Maurilio Lovely D, DO   16 Units at 10/10/22 2113   lamoTRIgine (LAMICTAL) tablet 200 mg  200 mg Oral Daily Erick Blinks, MD   200 mg at 10/11/22 0800   neomycin-bacitracin-polymyxin (NEOSPORIN) ointment  Topical PRN Erick Blinks, MD   Given at 10/10/22 1721   ondansetron (ZOFRAN) tablet 4 mg  4 mg Oral Q6H PRN Sherryll Burger, Pratik D, DO       Or   ondansetron [facility]) injection 4 mg  4 mg Intravenous Q6H PRN Sherryll Burger, Pratik D, DO       risperiDONE (RISPERDAL) tablet 1 mg  1 mg Oral QHS Shah, Pratik D, DO       risperiDONE (RISPERDAL) tablet 2 mg  2 mg Oral Q8H PRN Sherea Liptak, Jesusita Oka, FNP       And   ziprasidone (GEODON) injection 20 mg  20 mg Intramuscular PRN Olly Shiner, Jesusita Oka, FNP        Musculoskeletal: Strength & Muscle Tone: within normal limits Gait & Station: normal Patient leans: N/A  Psychiatric Specialty Exam:  Presentation  General Appearance:  Appropriate for Environment; Casual; Fairly Groomed  Eye Contact: Good  Speech: Clear and Coherent; Pressured  Speech Volume: Increased (May be normal for patient)  Handedness: Right  Mood and  Affect  Mood: Euthymic  Affect: Appropriate  Thought Process  Thought Processes: Coherent; Goal Directed  Descriptions of Associations:Intact  Orientation:Full (Time, Place and Person)  Thought Content:Logical  History of Schizophrenia/Schizoaffective disorder:No  Duration of Psychotic Symptoms:Greater than six months  Hallucinations:Hallucinations: None  Ideas of Reference:None  Suicidal Thoughts:Suicidal Thoughts: No  Homicidal Thoughts:Homicidal Thoughts: No  Sensorium  Memory: Immediate Good; Recent Good  Judgment: Fair  Insight: Fair  Art therapist  Concentration: Good  Attention Span: Good  Recall: Fair  Fund of Knowledge: Fair  Language: Good  Psychomotor Activity  Psychomotor Activity: Psychomotor Activity: Normal  Assets  Assets: Communication Skills; Physical Health; Resilience; Housing  Sleep  Sleep: Sleep: Good Number of Hours of Sleep: 8  Physical Exam: Physical Exam Vitals and nursing note reviewed.    Review of Systems  Constitutional: Negative.  Negative for chills and fever.  HENT: Negative.  Negative for hearing loss and tinnitus.   Eyes: Negative.  Negative for blurred vision and double vision.  Respiratory: Negative.  Negative for cough, sputum production, shortness of breath and wheezing.   Cardiovascular: Negative.  Negative for chest pain and palpitations.       Blood pressure 140/79, pulse 84.  Nursing staff to recheck vital signs.  Gastrointestinal: Negative.  Negative for heartburn, nausea and vomiting.  Genitourinary: Negative.  Negative for dysuria, frequency and urgency.  Musculoskeletal: Negative.  Negative for myalgias and neck pain.  Skin: Negative.  Negative for itching and rash.  Neurological: Negative.  Negative for dizziness and headaches.  Endo/Heme/Allergies: Negative.  Negative for environmental allergies and polydipsia. Does not bruise/bleed easily.                Reaction Severity Reaction Type Noted       Allergies    Trazodone And Nefazodone  Other (See Comments) Not Specified  12/27/2014 Hallucinations and nightmares      Psychiatric/Behavioral:  Positive for substance abuse.    Blood pressure (!) 140/79, pulse 84, temperature (!) 97 F (36.1 C), resp. rate 18, height 6' (1.829 m), weight 79.4 kg, SpO2 100 %. Body mass index is 23.73 kg/m.  Treatment Plan Summary: Daily contact with patient to assess and evaluate symptoms and progress in treatment and Medication management   Plan for psychotropic drugs: --To resume home medications upon D/C from the hospital. -- Meds include lamotrigine 200 mg/d, risperidone 1 mg QHS,  NOTE: Vyvanse 70 mg/day, was discontinued to due  mania.   Disposition: No evidence of imminent risk to self or others at present.   Patient does not meet criteria for psychiatric inpatient admission.  This service was provided via telemedicine using a 2-way, interactive audio and video technology.  Names of all persons participating in this telemedicine service and their role in this encounter. Name: Jackqulyn Livings Role: Patient  Name: Alan Mulder, NP Role: Provider  Name: Dr. Gasper Sells Role: Supervising physician  Name: Dr. Kerry Hough Role: [facility] physician      Taisia Fantini C. Bobbye Reinitz, FNP 10/11/2022
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
## 2022-10-14 ENCOUNTER — Encounter (HOSPITAL_COMMUNITY): Payer: Self-pay | Admitting: *Deleted

## 2022-10-14 ENCOUNTER — Emergency Department (HOSPITAL_COMMUNITY)
Admission: EM | Admit: 2022-10-14 | Discharge: 2022-10-14 | Disposition: A | Discharge status: Home / Self Care | Payer: No Typology Code available for payment source | Source: Home / Self Care | Attending: Emergency Medicine | Admitting: Emergency Medicine

## 2022-10-14 ENCOUNTER — Other Ambulatory Visit: Payer: Self-pay

## 2022-10-14 DIAGNOSIS — L02414 Cutaneous abscess of left upper limb: Secondary | ICD-10-CM | POA: Insufficient documentation

## 2022-10-14 DIAGNOSIS — Z794 Long term (current) use of insulin: Secondary | ICD-10-CM | POA: Insufficient documentation

## 2022-10-14 DIAGNOSIS — L03114 Cellulitis of left upper limb: Secondary | ICD-10-CM | POA: Diagnosis not present

## 2022-10-14 DIAGNOSIS — E119 Type 2 diabetes mellitus without complications: Secondary | ICD-10-CM | POA: Insufficient documentation

## 2022-10-14 MED ORDER — DOXYCYCLINE HYCLATE 100 MG PO CAPS: 100.0000 mg | ORAL_CAPSULE | Freq: Two times a day (BID) | ORAL | 0 refills | Status: DC

## 2022-10-14 NOTE — ED Notes (Signed)
Applied xeroform and gauze to left arm. Patient tolerated procedure

## 2022-10-14 NOTE — ED Provider Notes (Signed)
[facility] EMERGENCY DEPARTMENT Provider Note   CSN: [id number] Arrival date & time: 10/14/22  1837     History  Chief Complaint  Patient presents with   Abscess    Tyler Mann is a 41 y.o. male.   Abscess  This patient is a 41 year old male with a history of substance abuse, he also has diabetes, he was recently admitted to the hospital within the last week because of IV drug use, mild rhabdomyolysis,, noted to have a creatinine that had gone up to 1.5 but was resolved back to 0.9 by the time of discharge.  His creatine kinase had gone up to 1454 but resolved throughout his stay  The patient states his drug of choice is methamphetamine and he uses a needle to shoot it.  He reports that he usually goes to the antecubital fossa but somehow over the last few days has developed some swelling of the left forearm over the radial extensor aspect with some redness and a central area that looks like it is draining but nothing is coming out.  This is getting worse but it is not associated with fevers, he has no difficulty moving his hands.  No pain with flexion or extension of the fingers.  No numbness or weakness.    Home Medications Prior to Admission medications   Medication Sig Start Date End Date Taking? Authorizing Provider  doxycycline (VIBRAMYCIN) 100 MG capsule Take 1 capsule (100 mg total) by mouth 2 (two) times daily. 10/14/22  Yes Eber Hong, MD  cyclobenzaprine (FLEXERIL) 10 MG tablet Take 10 mg by mouth 3 (three) times daily as needed for muscle spasms.    [provider]  gabapentin (NEURONTIN) 600 MG tablet Take by mouth. 10/06/22   [provider]  ibuprofen (ADVIL) 800 MG tablet Take 1 tablet by mouth 3 (three) times daily as needed.    [provider]  insulin detemir (LEVEMIR) 100 UNIT/ML injection Inject 0.14 mLs (14 Units total) into the skin at bedtime. For diabetes management Patient taking differently: Inject 14-20 Units into the  skin at bedtime. For diabetes management 05/18/22 10/09/22  Pokhrel, Rebekah Chesterfield, MD  lamoTRIgine (LAMICTAL) 200 MG tablet Take 1 tablet by mouth daily.    [provider]  levocetirizine (XYZAL) 5 MG tablet Take 5 mg by mouth at bedtime. Patient not taking: Reported on 10/09/2022 02/19/22   [provider]  methocarbamol (ROBAXIN) 500 MG tablet Take 1 tablet (500 mg total) by mouth 2 (two) times daily as needed for muscle spasms. 09/18/22   Jacalyn Lefevre, MD  NOVOLOG 100 UNIT/ML injection Inject 5-20 Units into the skin 3 (three) times daily. 04/20/22   [provider]  pantoprazole (PROTONIX) 40 MG tablet Take 1 tablet (40 mg total) by mouth daily. 10/11/22   Erick Blinks, MD  risperiDONE (RISPERDAL) 1 MG tablet Take 1 tablet (1 mg total) by mouth at bedtime. 10/11/22 10/11/23  Erick Blinks, MD      Allergies    Trazodone and nefazodone    Review of Systems   Review of Systems  All other systems reviewed and are negative.   Physical Exam Updated Vital Signs BP 130/76 (BP Location: Right Arm)   Pulse 95   Temp 98.2 F (36.8 C) (Oral)   Resp 20   Ht 1.829 m (6')   Wt 80.3 kg Comment: Simultaneous filing. User may not have seen previous data.  SpO2 100%   BMI 24.01 kg/m  Physical Exam Vitals and nursing note  reviewed.  Constitutional:      General: He is not in acute distress.    Appearance: He is well-developed.  HENT:     Head: Normocephalic and atraumatic.     Mouth/Throat:     Pharynx: No oropharyngeal exudate.  Eyes:     General: No scleral icterus.       Right eye: No discharge.        Left eye: No discharge.     Conjunctiva/sclera: Conjunctivae normal.     Pupils: Pupils are equal, round, and reactive to light.  Neck:     Thyroid: No thyromegaly.     Vascular: No JVD.  Cardiovascular:     Rate and Rhythm: Normal rate and regular rhythm.     Heart sounds: Normal heart sounds. No murmur heard.    No friction rub. No gallop.  Pulmonary:      Effort: Pulmonary effort is normal. No respiratory distress.     Breath sounds: Normal breath sounds. No wheezing or rales.  Abdominal:     General: Bowel sounds are normal. There is no distension.     Palpations: Abdomen is soft. There is no mass.     Tenderness: There is no abdominal tenderness.  Musculoskeletal:        General: Swelling and tenderness present. Normal range of motion.     Cervical back: Normal range of motion and neck supple.     Right lower leg: No edema.     Left lower leg: No edema.     Comments: There is redness and warmth with some swelling to the left forearm in the mid forearm on the radial extensor surface of that arm.  There is induration of the skin, no obvious fluctuance  Lymphadenopathy:     Cervical: No cervical adenopathy.  Skin:    General: Skin is warm and dry.     Findings: No erythema or rash.     Comments: Absence of fluctuance but presence of induration and swelling of the left mid forearm  Neurological:     General: No focal deficit present.     Mental Status: He is alert.     Coordination: Coordination normal.  Psychiatric:        Behavior: Behavior normal.     ED Results / Procedures / Treatments   Labs (all labs ordered are listed, but only abnormal results are displayed) Labs Reviewed - No data to display  EKG None  Radiology No results found.  Procedures Ultrasound ED Soft Tissue  Date/Time: 10/14/2022 7:07 PM  Performed by: Eber Hong, MD Authorized by: Eber Hong, MD   Procedure details:    Indications: localization of abscess and evaluate for cellulitis     Transverse view:  Visualized   Longitudinal view:  Visualized   Images: archived     Limitations:  Body habitus, patient compliance and positioning Location:    Location: upper extremity     Side:  Left Findings:     abscess present    cellulitis present    no foreign body present Comments:       Marland KitchenMarland KitchenIncision and Drainage  Date/Time: 10/14/2022  7:46 PM  Performed by: Eber Hong, MD Authorized by: Eber Hong, MD   Consent:    Consent obtained:  Verbal   Consent given by:  Patient   Risks discussed:  Bleeding, damage to other organs, incomplete drainage, infection and pain   Alternatives discussed:  Delayed treatment Universal protocol:    Procedure explained and  questions answered to patient or proxy's satisfaction: yes     Immediately prior to procedure, a time out was called: yes     Patient identity confirmed:  Verbally with patient Location:    Type:  Abscess   Location:  Upper extremity   Upper extremity location:  Arm   Arm location:  L lower arm Pre-procedure details:    Skin preparation:  Betadine Sedation:    Sedation type:  None Anesthesia:    Anesthesia method:  Local infiltration   Local anesthetic:  Lidocaine 1% w/o epi Procedure type:    Complexity:  Complex Procedure details:    Ultrasound guidance: yes     Needle aspiration: no     Incision types:  Single straight   Incision depth:  Submucosal   Wound management:  Probed and deloculated, irrigated with saline and extensive cleaning   Drainage:  Purulent   Drainage amount:  Moderate   Wound treatment:  Wound left open   Packing materials:  1/2 in iodoform gauze Post-procedure details:    Procedure completion:  Tolerated well, no immediate complications Comments:            Medications Ordered in ED Medications - No data to display  ED Course/ Medical Decision Making/ A&P                           Medical Decision Making Risk Prescription drug management.   There is no lymphangitic swelling, there is a local area of what appears to be cellulitis infection in this patient was a diabetic and also an IV drug user.  He has no tachycardia no fevers no murmurs, I suspect that he has a local infection probably from shooting drugs though he denies using it in that area.  At this time he will need antibiotics but also an incision and  drainage based on the ultrasound which I performed showed that there was a fluid-filled cord  Successful incision moderate amount of pus, culture sent, patient agreeable to discharge on doxycycline, well-appearing        Final Clinical Impression(s) / ED Diagnoses Final diagnoses:  Abscess of left forearm    Rx / DC Orders ED Discharge Orders          Ordered    doxycycline (VIBRAMYCIN) 100 MG capsule  2 times daily        10/14/22 1945              Eber Hong, MD 10/14/22 1947

## 2022-10-14 NOTE — ED Triage Notes (Signed)
Pt with swelling and redness to left forearm since Thursday. Warm to touch per pt.  Denies any fevers at home.

## 2022-10-14 NOTE — Discharge Instructions (Signed)
Take the packing out in 3 days, keep this covered, take the doxycycline twice a day for 10 days  Doxycycline is an antibiotic which is taken twice a day, this treats bacterial infections that can cause staph infections, it treats sinus infections and some pneumonia.  In this case I would like for you to take the antibiotic exactly as prescribed until it is completed.  Please be aware that occasionally people will get a rash if they are in the sunlight for extended periods of time while taking this medicine.  Thank you for allowing [place] to treat you in the emergency department today.  After reviewing your examination and potential testing that was done it appears that you are safe to go home.  I would like for you to follow-up with your doctor within the next several days, have them obtain your results and follow-up with them to review all of these tests.  If you should develop severe or worsening symptoms return to the emergency department immediately

## 2022-10-16 ENCOUNTER — Other Ambulatory Visit (HOSPITAL_COMMUNITY): Payer: Self-pay | Admitting: *Deleted

## 2022-10-16 ENCOUNTER — Other Ambulatory Visit: Payer: Self-pay

## 2022-10-16 ENCOUNTER — Inpatient Hospital Stay (HOSPITAL_COMMUNITY)
Admission: EM | Admit: 2022-10-16 | Discharge: 2022-10-18 | DRG: 603 | Disposition: A | Discharge status: Home / Self Care | Payer: No Typology Code available for payment source | Attending: Family Medicine | Admitting: Family Medicine

## 2022-10-16 ENCOUNTER — Inpatient Hospital Stay (HOSPITAL_COMMUNITY): Payer: No Typology Code available for payment source

## 2022-10-16 ENCOUNTER — Encounter (HOSPITAL_COMMUNITY): Payer: Self-pay | Admitting: Emergency Medicine

## 2022-10-16 ENCOUNTER — Emergency Department (HOSPITAL_COMMUNITY): Payer: No Typology Code available for payment source

## 2022-10-16 DIAGNOSIS — L03114 Cellulitis of left upper limb: Principal | ICD-10-CM | POA: Diagnosis present

## 2022-10-16 DIAGNOSIS — F1910 Other psychoactive substance abuse, uncomplicated: Secondary | ICD-10-CM | POA: Diagnosis not present

## 2022-10-16 DIAGNOSIS — F17210 Nicotine dependence, cigarettes, uncomplicated: Secondary | ICD-10-CM | POA: Diagnosis present

## 2022-10-16 DIAGNOSIS — Z885 Allergy status to narcotic agent status: Secondary | ICD-10-CM | POA: Diagnosis not present

## 2022-10-16 DIAGNOSIS — E878 Other disorders of electrolyte and fluid balance, not elsewhere classified: Secondary | ICD-10-CM | POA: Diagnosis present

## 2022-10-16 DIAGNOSIS — E1021 Type 1 diabetes mellitus with diabetic nephropathy: Secondary | ICD-10-CM | POA: Diagnosis present

## 2022-10-16 DIAGNOSIS — M549 Dorsalgia, unspecified: Secondary | ICD-10-CM | POA: Diagnosis present

## 2022-10-16 DIAGNOSIS — X58XXXA Exposure to other specified factors, initial encounter: Secondary | ICD-10-CM | POA: Diagnosis present

## 2022-10-16 DIAGNOSIS — E871 Hypo-osmolality and hyponatremia: Secondary | ICD-10-CM | POA: Diagnosis present

## 2022-10-16 DIAGNOSIS — K219 Gastro-esophageal reflux disease without esophagitis: Secondary | ICD-10-CM | POA: Diagnosis present

## 2022-10-16 DIAGNOSIS — Z720 Tobacco use: Secondary | ICD-10-CM | POA: Diagnosis present

## 2022-10-16 DIAGNOSIS — I38 Endocarditis, valve unspecified: Secondary | ICD-10-CM

## 2022-10-16 DIAGNOSIS — Z79899 Other long term (current) drug therapy: Secondary | ICD-10-CM

## 2022-10-16 DIAGNOSIS — F319 Bipolar disorder, unspecified: Secondary | ICD-10-CM | POA: Diagnosis present

## 2022-10-16 DIAGNOSIS — D72829 Elevated white blood cell count, unspecified: Secondary | ICD-10-CM | POA: Diagnosis not present

## 2022-10-16 DIAGNOSIS — S51812A Laceration without foreign body of left forearm, initial encounter: Secondary | ICD-10-CM | POA: Diagnosis present

## 2022-10-16 DIAGNOSIS — S51811A Laceration without foreign body of right forearm, initial encounter: Secondary | ICD-10-CM | POA: Diagnosis present

## 2022-10-16 DIAGNOSIS — G8929 Other chronic pain: Secondary | ICD-10-CM | POA: Diagnosis present

## 2022-10-16 DIAGNOSIS — B9562 Methicillin resistant Staphylococcus aureus infection as the cause of diseases classified elsewhere: Secondary | ICD-10-CM | POA: Diagnosis present

## 2022-10-16 DIAGNOSIS — E1065 Type 1 diabetes mellitus with hyperglycemia: Secondary | ICD-10-CM | POA: Diagnosis present

## 2022-10-16 DIAGNOSIS — Z794 Long term (current) use of insulin: Secondary | ICD-10-CM | POA: Diagnosis not present

## 2022-10-16 DIAGNOSIS — R7989 Other specified abnormal findings of blood chemistry: Secondary | ICD-10-CM | POA: Diagnosis not present

## 2022-10-16 DIAGNOSIS — L02414 Cutaneous abscess of left upper limb: Secondary | ICD-10-CM | POA: Diagnosis not present

## 2022-10-16 DIAGNOSIS — D649 Anemia, unspecified: Secondary | ICD-10-CM | POA: Diagnosis present

## 2022-10-16 LAB — COMPREHENSIVE METABOLIC PANEL
ALT: 21 U/L (ref 0–44)
AST: 11 U/L — ABNORMAL LOW (ref 15–41)
Albumin: 3.6 g/dL (ref 3.5–5.0)
Alkaline Phosphatase: 126 U/L (ref 38–126)
Anion gap: 12 (ref 5–15)
BUN: 36 mg/dL — ABNORMAL HIGH (ref 6–20)
CO2: 22 mmol/L (ref 22–32)
Calcium: 8.9 mg/dL (ref 8.9–10.3)
Chloride: 95 mmol/L — ABNORMAL LOW (ref 98–111)
Creatinine, Ser: 1.18 mg/dL (ref 0.61–1.24)
GFR, Estimated: >60 mL/min (ref >60)
Glucose, Bld: 660 mg/dL (ref 70–99)
Potassium: 4.5 mmol/L (ref 3.5–5.1)
Sodium: 129 mmol/L — ABNORMAL LOW (ref 135–145)
Total Bilirubin: 0.5 mg/dL (ref 0.3–1.2)
Total Protein: 8.2 g/dL — ABNORMAL HIGH (ref 6.5–8.1)

## 2022-10-16 LAB — GLUCOSE, CAPILLARY
Glucose-Capillary: 416 mg/dL — ABNORMAL HIGH (ref 70–99)
Glucose-Capillary: 334 mg/dL — ABNORMAL HIGH (ref 70–99)
Glucose-Capillary: 168 mg/dL — ABNORMAL HIGH (ref 70–99)
Glucose-Capillary: 266 mg/dL — ABNORMAL HIGH (ref 70–99)

## 2022-10-16 LAB — CBC WITH DIFFERENTIAL/PLATELET
Abs Immature Granulocytes: 0.05 10*3/uL (ref 0.00–0.07)
Basophils Absolute: 0.1 10*3/uL (ref 0.0–0.1)
Basophils Relative: 1 %
Eosinophils Absolute: 0.7 10*3/uL — ABNORMAL HIGH (ref 0.0–0.5)
Eosinophils Relative: 5 %
HCT: 29.7 % — ABNORMAL LOW (ref 39.0–52.0)
Hemoglobin: 9.9 g/dL — ABNORMAL LOW (ref 13.0–17.0)
Immature Granulocytes: 0 %
Lymphocytes Relative: 15 %
Lymphs Abs: 1.9 10*3/uL (ref 0.7–4.0)
MCH: 28.8 pg (ref 26.0–34.0)
MCHC: 33.3 g/dL (ref 30.0–36.0)
MCV: 86.3 fL (ref 80.0–100.0)
Monocytes Absolute: 0.9 10*3/uL (ref 0.1–1.0)
Monocytes Relative: 7 %
Neutro Abs: 9.2 10*3/uL — ABNORMAL HIGH (ref 1.7–7.7)
Neutrophils Relative %: 72 %
Platelets: 543 10*3/uL — ABNORMAL HIGH (ref 150–400)
RBC: 3.44 MIL/uL — ABNORMAL LOW (ref 4.22–5.81)
RDW: 13.4 % (ref 11.5–15.5)
WBC: 12.8 10*3/uL — ABNORMAL HIGH (ref 4.0–10.5)
nRBC: 0 % (ref 0.0–0.2)

## 2022-10-16 LAB — URINALYSIS, ROUTINE W REFLEX MICROSCOPIC
Bacteria, UA: NONE SEEN
Bilirubin Urine: NEGATIVE
Glucose, UA: >=500 mg/dL — AB
Hgb urine dipstick: NEGATIVE
Ketones, ur: 5 mg/dL — AB
Leukocytes,Ua: NEGATIVE
Nitrite: NEGATIVE
Protein, ur: NEGATIVE mg/dL
Specific Gravity, Urine: 1.022 (ref 1.005–1.030)
pH: 7 (ref 5.0–8.0)

## 2022-10-16 LAB — ECHOCARDIOGRAM COMPLETE
Area-P 1/2: 3.27 cm2
Height: 72 in
S' Lateral: 3 cm
Weight: 2891.2 oz

## 2022-10-16 LAB — C-REACTIVE PROTEIN: CRP: 4.6 mg/dL — ABNORMAL HIGH (ref <1.0)

## 2022-10-16 LAB — SEDIMENTATION RATE: Sed Rate: 105 mm/hr — ABNORMAL HIGH (ref 0–16)

## 2022-10-16 LAB — CBG MONITORING, ED
Glucose-Capillary: 363 mg/dL — ABNORMAL HIGH (ref 70–99)
Glucose-Capillary: 279 mg/dL — ABNORMAL HIGH (ref 70–99)

## 2022-10-16 LAB — LACTIC ACID, PLASMA
Lactic Acid, Venous: 1.1 mmol/L (ref 0.5–1.9)
Lactic Acid, Venous: 1.6 mmol/L (ref 0.5–1.9)

## 2022-10-16 LAB — PROTIME-INR
INR: 0.9 (ref 0.8–1.2)
Prothrombin Time: 11.9 seconds (ref 11.4–15.2)

## 2022-10-16 LAB — APTT: aPTT: 32 seconds (ref 24–36)

## 2022-10-16 LAB — GLUCOSE, RANDOM: Glucose, Bld: 388 mg/dL — ABNORMAL HIGH (ref 70–99)

## 2022-10-16 MED ORDER — INSULIN ASPART 100 UNIT/ML IJ SOLN
5.0000 [IU] | Freq: Three times a day (TID) | INTRAMUSCULAR | Status: DC
  Administered 2022-10-16 (×2): 5 [IU] via SUBCUTANEOUS

## 2022-10-16 MED ORDER — INSULIN ASPART 100 UNIT/ML IJ SOLN: 6.0000 [IU] | Freq: Three times a day (TID) | INTRAMUSCULAR | Status: DC

## 2022-10-16 MED ORDER — LACTATED RINGERS IV SOLN: INTRAVENOUS | Status: DC

## 2022-10-16 MED ORDER — SODIUM CHLORIDE 0.9 % IV SOLN
2.0000 g | INTRAVENOUS | Status: DC
  Administered 2022-10-16 – 2022-10-17 (×2): 2 g via INTRAVENOUS
  Filled 2022-10-16 (×2): qty 20

## 2022-10-16 MED ORDER — INSULIN ASPART 100 UNIT/ML IJ SOLN
0.0000 [IU] | Freq: Every day | INTRAMUSCULAR | Status: DC
  Administered 2022-10-16: 3 [IU] via SUBCUTANEOUS
  Administered 2022-10-17: 2 [IU] via SUBCUTANEOUS

## 2022-10-16 MED ORDER — ACETAMINOPHEN 650 MG RE SUPP: 650.0000 mg | Freq: Four times a day (QID) | RECTAL | Status: DC | PRN

## 2022-10-16 MED ORDER — DEXTROSE IN LACTATED RINGERS 5 % IV SOLN: INTRAVENOUS | Status: DC

## 2022-10-16 MED ORDER — ENOXAPARIN SODIUM 40 MG/0.4ML IJ SOSY
40.0000 mg | PREFILLED_SYRINGE | INTRAMUSCULAR | Status: DC
  Filled 2022-10-16 (×3): qty 0.4

## 2022-10-16 MED ORDER — ACETAMINOPHEN 325 MG PO TABS
650.0000 mg | ORAL_TABLET | Freq: Four times a day (QID) | ORAL | Status: DC | PRN
  Administered 2022-10-16: 650 mg via ORAL
  Filled 2022-10-16: qty 2

## 2022-10-16 MED ORDER — INSULIN GLARGINE-YFGN 100 UNIT/ML ~~LOC~~ SOLN
10.0000 [IU] | Freq: Every day | SUBCUTANEOUS | Status: DC
  Filled 2022-10-16: qty 0.1

## 2022-10-16 MED ORDER — INSULIN ASPART 100 UNIT/ML IJ SOLN
20.0000 [IU] | Freq: Once | INTRAMUSCULAR | Status: AC
  Administered 2022-10-16: 20 [IU] via SUBCUTANEOUS

## 2022-10-16 MED ORDER — INSULIN ASPART 100 UNIT/ML IJ SOLN
0.0000 [IU] | Freq: Three times a day (TID) | INTRAMUSCULAR | Status: DC
  Administered 2022-10-16: 3 [IU] via SUBCUTANEOUS
  Administered 2022-10-16: 11 [IU] via SUBCUTANEOUS
  Administered 2022-10-17: 2 [IU] via SUBCUTANEOUS
  Administered 2022-10-17: 5 [IU] via SUBCUTANEOUS

## 2022-10-16 MED ORDER — VANCOMYCIN HCL IN DEXTROSE 1-5 GM/200ML-% IV SOLN: 1000.0000 mg | Freq: Once | INTRAVENOUS | Status: DC

## 2022-10-16 MED ORDER — GABAPENTIN 300 MG PO CAPS
600.0000 mg | ORAL_CAPSULE | Freq: Two times a day (BID) | ORAL | Status: DC
  Administered 2022-10-16 – 2022-10-18 (×5): 600 mg via ORAL
  Filled 2022-10-16 (×7): qty 2

## 2022-10-16 MED ORDER — VANCOMYCIN HCL 1750 MG/350ML IV SOLN
1750.0000 mg | Freq: Once | INTRAVENOUS | Status: AC
  Administered 2022-10-16: 1750 mg via INTRAVENOUS
  Filled 2022-10-16: qty 350

## 2022-10-16 MED ORDER — LAMOTRIGINE 100 MG PO TABS
200.0000 mg | ORAL_TABLET | Freq: Every day | ORAL | Status: DC
  Administered 2022-10-16 – 2022-10-18 (×3): 200 mg via ORAL
  Filled 2022-10-16 (×3): qty 2

## 2022-10-16 MED ORDER — RISPERIDONE 1 MG PO TABS
1.0000 mg | ORAL_TABLET | Freq: Every day | ORAL | Status: DC
  Administered 2022-10-16 – 2022-10-17 (×2): 1 mg via ORAL
  Filled 2022-10-16 (×2): qty 1

## 2022-10-16 MED ORDER — LACTATED RINGERS IV BOLUS
1000.0000 mL | INTRAVENOUS | Status: AC
  Administered 2022-10-16 (×2): 1000 mL via INTRAVENOUS

## 2022-10-16 MED ORDER — INSULIN REGULAR(HUMAN) IN NACL 100-0.9 UT/100ML-% IV SOLN
INTRAVENOUS | Status: DC
  Filled 2022-10-16: qty 100

## 2022-10-16 MED ORDER — PANTOPRAZOLE SODIUM 40 MG PO TBEC
40.0000 mg | DELAYED_RELEASE_TABLET | Freq: Every day | ORAL | Status: DC
  Administered 2022-10-16 – 2022-10-18 (×3): 40 mg via ORAL
  Filled 2022-10-16 (×3): qty 1

## 2022-10-16 MED ORDER — IOHEXOL 300 MG/ML  SOLN
75.0000 mL | Freq: Once | INTRAMUSCULAR | Status: AC | PRN
  Administered 2022-10-16: 75 mL via INTRAVENOUS

## 2022-10-16 MED ORDER — INSULIN DETEMIR 100 UNIT/ML ~~LOC~~ SOLN
12.0000 [IU] | Freq: Two times a day (BID) | SUBCUTANEOUS | Status: DC
  Administered 2022-10-16 (×2): 12 [IU] via SUBCUTANEOUS
  Filled 2022-10-16 (×5): qty 0.12

## 2022-10-16 MED ORDER — ONDANSETRON HCL 4 MG PO TABS: 4.0000 mg | ORAL_TABLET | Freq: Four times a day (QID) | ORAL | Status: DC | PRN

## 2022-10-16 MED ORDER — INSULIN DETEMIR 100 UNIT/ML ~~LOC~~ SOLN
15.0000 [IU] | Freq: Two times a day (BID) | SUBCUTANEOUS | Status: DC
  Filled 2022-10-16 (×3): qty 0.15

## 2022-10-16 MED ORDER — VANCOMYCIN HCL 1250 MG/250ML IV SOLN
1250.0000 mg | Freq: Two times a day (BID) | INTRAVENOUS | Status: DC
  Administered 2022-10-16 – 2022-10-18 (×4): 1250 mg via INTRAVENOUS
  Filled 2022-10-16 (×4): qty 250

## 2022-10-16 MED ORDER — DEXTROSE 50 % IV SOLN: 0.0000 mL | INTRAVENOUS | Status: DC | PRN

## 2022-10-16 MED ORDER — ONDANSETRON HCL 4 MG/2ML IJ SOLN: 4.0000 mg | Freq: Four times a day (QID) | INTRAMUSCULAR | Status: DC | PRN

## 2022-10-16 NOTE — ED Notes (Signed)
Offered to place dressing on wound to left arm, patient declines at this time. States that it feels better being left open to air. Wound is draining, patient has towel around arm.

## 2022-10-16 NOTE — Progress Notes (Signed)
Pt arrived to room #335 via WC from ED. Pt ambulatory from chair to bed, steady on feet. A&O, denies c/o pain at present. Left forearm arm wound with packing intact and dried drainage, pt requests no covering dressing as it makes area more uncomfortable. Has towel covering area. IVF infusing without difficulty. Oriented to room and safety procedures, states understanding. Mother at bedside.

## 2022-10-16 NOTE — Consult Note (Signed)
WOC Nurse Consult Note: Reason for Consult: open wound left forearm in the presence of DM, IVDU, polysubtance abuse, mental health issues. Area with recent I&D 10/14/22 in ED. Returned for worsening of the wound. Noted patient has refused dressings per bedside nursing Wound type: infectious or trauma/IVDU Pressure Injury POA: NA Measurement:see nursing flow sheets (1cm x 1cm) Wound bed: no images to review, will request Drainage (amount, consistency, odor) noted bedside nursing reports drainage, no description  Periwound:edema  Dressing procedure/placement/frequency: Continue 1/4" iodoform packing (appears 1/2" used in ED) ordered smaller packing strip. Change daily if patient allows.   Re consult if needed, will not follow at this time. Thanks  Lidie Glade [employer], RN,CWOCN, CNS, CWON-AP [phone])

## 2022-10-16 NOTE — Progress Notes (Signed)
Patient seen and examined; admitted after midnight secondary to left forearm abscess with superimposed cellulitis; patient with underlying history of type 1 diabetes (uncontrolled with hyperglycemia) and also prior history of IV drug use.  Patient reported no recent usage and no injecting in the affected area.  He was seen on 10/14/22 in the ED for left forearm abscess, status post I&D and discharged home on doxycycline.  Patient reports increased pain and drainage and decided to come back to the hospital for further evaluation and management.  Blood sugar on arrival 660; WBCs 12.8K; no fever.  Please refer to H&P written by Dr. Adefeso on 10/16/2022 for further info/details on admission.    Plan: -Continue current broad-spectrum antibiotic -Check ESR and CRP -IV fluid resuscitation, supportive care and analgesics will be provided -Follow WBCs in a.m. -Aggressive control outpatient CBGs will be provided in the presence of acute infection. -Case has been discussed with orthopedic surgeon; they will follow him along. -follow culture results.    MD [phone]  

## 2022-10-16 NOTE — Progress Notes (Signed)
*  PRELIMINARY RESULTS* Echocardiogram 2D Echocardiogram has been performed.  Stacey Drain 10/16/2022, 9:17 AM

## 2022-10-16 NOTE — Progress Notes (Signed)
MD Dallas Schimke in to evaluate pt and wound. MD removed packing, irrigated wound with 1000 ml normal saline. MD Dallas Schimke advises no further packing needed, but to irrigate wound thoroughly with normal saline 3 to 4 times a day, ensuring wound stays open. Pt aware of no further packing planned as well, states understanding.

## 2022-10-16 NOTE — Progress Notes (Signed)
Pharmacy Antibiotic Note  Tyler Mann is a 41 y.o. male admitted on 10/16/2022 with cellulitis.  Pharmacy has been consulted for vancomycin dosing.  Plan: Vancomycin 1750 mg IV x 1 dose. Vancomycin 1250 mg IV every 12 hours. Monitor labs, c/s, and vanco level as indicated.  Height: 6' (182.9 cm) Weight: 82 kg (180 lb 11.2 oz) IBW/kg (Calculated) : 77.6  Temp (24hrs), Avg:97.9 F (36.6 C), Min:97.7 F (36.5 C), Max:98.2 F (36.8 C)  Recent Labs  Lab 10/09/22 1244 10/10/22 0257 10/11/22 0329 10/16/22 0137 10/16/22 0225  WBC 12.2* 9.6 9.8 12.8*  --   CREATININE 1.52* 1.05 0.90 1.18  --   LATICACIDVEN  --   --   --  1.1 1.6    Estimated Creatinine Clearance: 88.6 mL/min (by C-G formula based on SCr of 1.18 mg/dL).    Allergies  Allergen Reactions   Trazodone And Nefazodone Other (See Comments)    Hallucinations and nightmares    Antimicrobials this admission: Vanco 11/28 >> CTX 11/28 >>  Microbiology results: 11/28 BCx: pending  Thank you for allowing pharmacy to be a part of this patient's care.  Judeth Cornfield, PharmD Clinical Pharmacist 10/16/2022 11:23 AM

## 2022-10-16 NOTE — Inpatient Diabetes Management (Signed)
Inpatient Diabetes Program Recommendations  AACE/ADA: New Consensus Statement on Inpatient Glycemic Control   Target Ranges:  Prepandial:   less than 140 mg/dL      Peak postprandial:   less than 180 mg/dL (1-2 hours)      Critically ill patients:  140 - 180 mg/dL    Latest Reference Range & Units 10/16/22 03:44 10/16/22 06:29 10/16/22 08:10  Glucose-Capillary 70 - 99 mg/dL 149 (H) 702 (H) 637 (H)    Latest Reference Range & Units 10/16/22 01:37  CO2 22 - 32 mmol/L 22  Glucose 70 - 99 mg/dL 858 (HH)  Anion gap 5 - 15  12   Review of Glycemic Control  Diabetes history: DM1 Outpatient Diabetes medications: Levemir 14-20 units QHS, Novolog 5-20 units TID with meals Current orders for Inpatient glycemic control: Semglee 10 units QHS, Novolog 0-15 units TID with meals, Novolog 0-5 units QHS  Inpatient Diabetes Program Recommendations:    Insulin: Please consider changing frequency of Semglee to 10 units daily (to start at 10:00 am today), decrease Novolog correction to Novolog 0-9 units TID with meals, and order Novolog 2 units TID with meals for meal coverage if patient eats at least 50% of meals.   Thanks, Orlando Penner, RN, MSN, CDCES Diabetes Coordinator Inpatient Diabetes Program [phone] (Team Pager from 8am to 5pm)

## 2022-10-16 NOTE — Discharge Instructions (Addendum)
1)Keep wound clean and dry----  2)Follow up with Dr. [facility] [address] [place], [place] [address], [phone] 3)Follow up with Benita Stabile, MD -primary care physician for ongoing management of his diabetes 4) complete abstinence from tobacco (smoking cessation) advised

## 2022-10-16 NOTE — H&P (Signed)
History and Physical    Patient: Tyler FruitJohn E Calk OZH:[id number] DOB: 04/12/1981 DOA: 10/16/2022 DOS: the patient was seen and examined on 10/16/2022 PCP: Benita StabileHall, Muhammad Z, MD  Patient coming from: Home  Chief Complaint:  Chief Complaint  Patient presents with   Arm Pain   HPI: Tyler Mann is a 41 y.o. male with medical history significant of T1DM, diabetic nephropathy, opiate dependence, tobacco abuse, depression, neuropathy, polysubstance abuse who presents to the emergency department due to 1 week of increasing and worsening swelling of face left forearm. Patient was admitted from 11/21 to 11/23 due to acute toxic encephalopathy in the setting of bipolar disorder, acute kidney injury.  Patient states that prior to being discharged from the hospital, he noted pain in his left forearm and this progressed to an abscess after discharge, this further progressed to swelling, redness of the skin around the wound and the forearm was very warm to touch.  He denies chest pain, shortness of breath, fever, chills, nausea, vomiting.  ED Course:  In the emergency department, respiratory rate was 23/min and BP was 141/73, other vital signs were within normal range.  Workup in the ED showed leukocytosis and normocytic anemia, BMP showed hyponatremia, hypochloremia and blood glucose of 660.  Lactic acid 1.1 > 1.6. CT forearm of left upper extremity showed: 1. 1.3 x 1.3 x 1.9 cm abscess cavity in the dorsolateral aspect of the mid forearm, with dense packing material in the cavity. 2. Moderate superficial edema in the dorsolateral aspect of the mid forearm, extending down to the brachioradialis muscle with loss of definition between the fat and the muscle. Underlying myositis or pyomyositis would be difficult to exclude but no discrete abscess is seen in the muscle itself. 3. No deep soft tissue fluid collections or free air. Absence of findings of a necrotizing infection on CT however, should not delay  surgical exploration if there is sufficient clinical concern. 4. Vascular calcifications. Chest x-ray showed no active disease. Patient was treated with IV vancomycin.  IV hydration was provided.  Hospitalist was asked to admit patient for further evaluation and management.  Review of Systems: Review of systems as noted in the HPI. All other systems reviewed and are negative.   Past Medical History:  Diagnosis Date   ADD (attention deficit disorder)    Bipolar disorder (HCC)    Chronic back pain    Diabetes mellitus    type 1   Narcotic addiction (HCC)    Hx IV heroin abuse   Neuropathy    Past Surgical History:  Procedure Laterality Date   INCISION AND DRAINAGE     MOUTH SURGERY      Social History:  reports that he has been smoking cigarettes. He has a 8.50 pack-year smoking history. He uses smokeless tobacco. He reports current alcohol use. He reports that he does not currently use drugs after having used the following drugs: Methamphetamines and Heroin.   Allergies  Allergen Reactions   Trazodone And Nefazodone Other (See Comments)    Hallucinations and nightmares    History reviewed. No pertinent family history.   Prior to Admission medications   Medication Sig Start Date End Date Taking? Authorizing Provider  cyclobenzaprine (FLEXERIL) 10 MG tablet Take 10 mg by mouth 3 (three) times daily as needed for muscle spasms.    [provider]  doxycycline (VIBRAMYCIN) 100 MG capsule Take 1 capsule (100 mg total) by mouth 2 (two) times daily. 10/14/22   Eber HongMiller, Brian, MD  gabapentin (NEURONTIN) 600 MG tablet Take by mouth. 10/06/22   [provider]  ibuprofen (ADVIL) 800 MG tablet Take 1 tablet by mouth 3 (three) times daily as needed.    [provider]  insulin detemir (LEVEMIR) 100 UNIT/ML injection Inject 0.14 mLs (14 Units total) into the skin at bedtime. For diabetes management Patient taking differently: Inject 14-20 Units into the skin at  bedtime. For diabetes management 05/18/22 10/09/22  Pokhrel, Rebekah Chesterfield, MD  lamoTRIgine (LAMICTAL) 200 MG tablet Take 1 tablet by mouth daily.    [provider]  levocetirizine (XYZAL) 5 MG tablet Take 5 mg by mouth at bedtime. Patient not taking: Reported on 10/09/2022 02/19/22   [provider]  methocarbamol (ROBAXIN) 500 MG tablet Take 1 tablet (500 mg total) by mouth 2 (two) times daily as needed for muscle spasms. 09/18/22   Jacalyn Lefevre, MD  NOVOLOG 100 UNIT/ML injection Inject 5-20 Units into the skin 3 (three) times daily. 04/20/22   [provider]  pantoprazole (PROTONIX) 40 MG tablet Take 1 tablet (40 mg total) by mouth daily. 10/11/22   Erick Blinks, MD  risperiDONE (RISPERDAL) 1 MG tablet Take 1 tablet (1 mg total) by mouth at bedtime. 10/11/22 10/11/23  Erick Blinks, MD    Physical Exam: BP (!) 157/83   Pulse 88   Temp 97.7 F (36.5 C) (Oral)   Resp 16   Ht 6' (1.829 m)   Wt 80.3 kg   SpO2 96%   BMI 24.01 kg/m   General: 41 y.o. year-old male well developed well nourished in no acute distress.  Alert and oriented x3. HEENT: NCAT, EOMI Neck: Supple, trachea medial Cardiovascular: Regular rate and rhythm with no rubs or gallops.  No thyromegaly or JVD noted.  No lower extremity edema. 2/4 pulses in all 4 extremities. Respiratory: Clear to auscultation with no wheezes or rales. Good inspiratory effort. Abdomen: Soft, nontender nondistended with normal bowel sounds x4 quadrants. Muskuloskeletal: Swelling and tenderness of left forearm noted.   Neuro: CN II-XII intact, strength 5/5 x 4, sensation, reflexes intact Skin: Left forearm abscess with packing in place noted.  Erythema surrounding the wound was noted.  Site of erythema warm to touch and was indurated. Psychiatry: Judgement and insight appear normal. Mood is appropriate for condition and setting          Labs on Admission:  Basic Metabolic Panel: Recent Labs  Lab 10/09/22 1244  10/10/22 0257 10/11/22 0329 10/16/22 0137  NA 138 137 140 129*  K 3.9 4.2 4.1 4.5  CL 105 107 111 95*  CO2 24 24 25 22   GLUCOSE 202* 236* 128* 660*  BUN 21* 15 9 36*  CREATININE 1.52* 1.05 0.90 1.18  CALCIUM 8.9 8.6* 8.4* 8.9  MG 2.1 2.2  --   --    Liver Function Tests: Recent Labs  Lab 10/09/22 1244 10/16/22 0137  AST 51* 11*  ALT 46* 21  ALKPHOS 93 126  BILITOT 0.6 0.5  PROT 7.6 8.2*  ALBUMIN 3.8 3.6   No results for input(s): "LIPASE", "AMYLASE" in the last 168 hours. No results for input(s): "AMMONIA" in the last 168 hours. CBC: Recent Labs  Lab 10/09/22 1244 10/10/22 0257 10/11/22 0329 10/16/22 0137  WBC 12.2* 9.6 9.8 12.8*  NEUTROABS 9.2*  --   --  9.2*  HGB 9.3* 9.0* 9.2* 9.9*  HCT 29.0* 28.5* 29.5* 29.7*  MCV 89.5 91.1 90.8 86.3  PLT 384 391 402* 543*   Cardiac Enzymes: Recent  Labs  Lab 10/09/22 1241 10/11/22 0329  CKTOTAL 1,454* 413*    BNP (last 3 results) No results for input(s): "BNP" in the last 8760 hours.  ProBNP (last 3 results) No results for input(s): "PROBNP" in the last 8760 hours.  CBG: Recent Labs  Lab 10/10/22 2146 10/11/22 0706 10/11/22 1119 10/16/22 0344 10/16/22 0629  GLUCAP 261* 82 375* 363* 279*    Radiological Exams on Admission: CT FOREARM LEFT W CONTRAST  Result Date: 10/16/2022 CLINICAL DATA:  Left forearm abscess, check for deep soft tissue infection, seen last night in the ED for the same abnormality and underwent abscess drainage at that time. EXAM: CT OF THE UPPER LEFT EXTREMITY WITH CONTRAST TECHNIQUE: Multidetector CT imaging of the upper left extremity was performed according to the standard protocol following intravenous contrast administration. RADIATION DOSE REDUCTION: This exam was performed according to the departmental dose-optimization program which includes automated exposure control, adjustment of the mA and/or kV according to patient size and/or use of iterative reconstruction technique. CONTRAST:   22mL OMNIPAQUE IOHEXOL 300 MG/ML  SOLN COMPARISON:  None Available. FINDINGS: Bones/Joint/Cartilage There is normal bone mineralization without evidence of fracture, dislocation or primary pathologic bone lesion. Arthritic changes are not seen. No joint effusions are seen. Ligaments Suboptimally assessed by CT. Muscles and Tendons There is moderate superficial edema in the dorsolateral aspect of the mid forearm, extending down to the brachioradialis muscle with loss of definition between the fat and the muscle. Underlying myositis or pyomyositis would be difficult to exclude but no discrete abscess is seen in the muscle itself. Other imaged muscle groups are unremarkable, as visualized. No intramuscular or intermuscular soft tissue gas or focal fluid collections are seen. Area tendons are not well seen with this technique but intact as far as visualized. Soft tissues Skin thickening and underlying edema are noted in the dorsolateral aspect of the mid forearm. There is dense packing material within a small cavity underlying the skin surface in this area, the cavity estimated 1.3 x 1.3 cm AP and transverse and 1.9 cm craniocaudal. This is consistent with a packed abscess cavity. There is scattered nonlocalizing fluid along fat muscle interfaces underlying the edema but no organized fluid collection. Vascular structures demonstrate enhancement. There are calcifications in the radial and ulnar arteries extending into the wrist. IMPRESSION: 1. 1.3 x 1.3 x 1.9 cm abscess cavity in the dorsolateral aspect of the mid forearm, with dense packing material in the cavity. 2. Moderate superficial edema in the dorsolateral aspect of the mid forearm, extending down to the brachioradialis muscle with loss of definition between the fat and the muscle. Underlying myositis or pyomyositis would be difficult to exclude but no discrete abscess is seen in the muscle itself. 3. No deep soft tissue fluid collections or free air. Absence of  findings of a necrotizing infection on CT however, should not delay surgical exploration if there is sufficient clinical concern. 4. Vascular calcifications. Electronically Signed   By: Almira Bar M.D.   On: 10/16/2022 03:39   DG Chest Port 1 View  Result Date: 10/16/2022 CLINICAL DATA:  Questionable for sepsis. EXAM: PORTABLE CHEST 1 VIEW COMPARISON:  Mar 21, 2019 FINDINGS: The heart size and mediastinal contours are within normal limits. Low lung volumes are noted. Both lungs are clear. The visualized skeletal structures are unremarkable. IMPRESSION: No active disease. Electronically Signed   By: Aram Candela M.D.   On: 10/16/2022 01:51    EKG: I independently viewed the EKG done and my  findings are as followed: Normal sinus rhythm at a rate of 79 bpm  Assessment/Plan Present on Admission:  Cellulitis of left upper extremity  Tobacco abuse  Polysubstance abuse (HCC)  Leukocytosis  Principal Problem:   Cellulitis of left upper extremity Active Problems:   Tobacco abuse   Leukocytosis   Polysubstance abuse (HCC)   Abscess of left forearm   Uncontrolled type 1 diabetes mellitus with hyperglycemia, with long-term current use of insulin (HCC)   Pseudohyponatremia   GERD (gastroesophageal reflux disease)   Left forearm abscess with superimposed cellulitis of left upper extremity Patient presented to the emergency department yesterday, I & D was done, he was discharged with doxycycline which he just started to take, but returned to the ED due to progressive worsening of the wound. Patient was started on IV vancomycin, IV hydration was provided, we shall continue with same at this time Continue wound care Consider surgical consult for worsening abscess Due to patient's history of IV drug abuse, it would be reasonable to do a TTE to ensure no endocarditis.  Pseudohyponatremia Na 129, corrected sodium level based on CBG 660 is 138 Continue to monitor sodium levels with morning  labs  Leukocytosis WBC 12.8, this is possibly reactive Continue to monitor WBC with morning labs  Type 1 diabetes mellitus with hyperglycemia CBG 660, apparently patient took 15 units of insulin prior to arrival to the ED and his blood glucose dropped to 363. Continue Semglee 10 units and adjust dose accordingly Continue ISS and hypoglycemia protocol  History of polysubstance abuse and tobacco abuse Patient denies use of any substance abuse at this time He was counseled on cessation of tobacco abuse  GERD Continue Protonix   DVT prophylaxis: Lovenox  Code Status: Full code  Family Communication: Mom at bedside (all questions answered to satisfaction)  Consults: None  Severity of Illness: The appropriate patient status for this patient is INPATIENT. Inpatient status is judged to be reasonable and necessary in order to provide the required intensity of service to ensure the patient's safety. The patient's presenting symptoms, physical exam findings, and initial radiographic and laboratory data in the context of their chronic comorbidities is felt to place them at high risk for further clinical deterioration. Furthermore, it is not anticipated that the patient will be medically stable for discharge from the hospital within 2 midnights of admission.   * I certify that at the point of admission it is my clinical judgment that the patient will require inpatient hospital care spanning beyond 2 midnights from the point of admission due to high intensity of service, high risk for further deterioration and high frequency of surveillance required.*  Author: Frankey Shown, DO 10/16/2022 7:55 AM  For on call review [email or web address].

## 2022-10-16 NOTE — Progress Notes (Signed)
Left arm abscess I&D site flushed with 1000 ml normal saline & cotton tipped swab used to gently probe wound to ensure it remains open. No pus expressed from wound at this time, only pale serosanguinous drainage noted. Pt tolerated well. Area of marked redness and swelling on left arm has noticeable decrease since admission this am. Pt has no c/o pain.  Pt's blood sugars are responding well to insulin admin. Pt eating and drinking without n/v. Ambulating in room without diff or assistance.

## 2022-10-16 NOTE — ED Provider Notes (Signed)
[facility] EMERGENCY DEPARTMENT Provider Note   CSN: [id number] Arrival date & time: 10/16/22  0101     History  Chief Complaint  Patient presents with   Arm Pain    Tyler Mann is a 40 y.o. male.  Patient presents with complaints of increased pain and swelling of his left arm.  He was seen in the ED yesterday and had incision and drainage of an abscess of the left forearm.  Patient discharged on doxycycline.  Reports diffusely increased swelling of the entire arm with increased purulent drainage from the wound area.       Home Medications Prior to Admission medications   Medication Sig Start Date End Date Taking? Authorizing Provider  cyclobenzaprine (FLEXERIL) 10 MG tablet Take 10 mg by mouth 3 (three) times daily as needed for muscle spasms.    [provider]  doxycycline (VIBRAMYCIN) 100 MG capsule Take 1 capsule (100 mg total) by mouth 2 (two) times daily. 10/14/22   Eber Hong, MD  gabapentin (NEURONTIN) 600 MG tablet Take by mouth. 10/06/22   [provider]  ibuprofen (ADVIL) 800 MG tablet Take 1 tablet by mouth 3 (three) times daily as needed.    [provider]  insulin detemir (LEVEMIR) 100 UNIT/ML injection Inject 0.14 mLs (14 Units total) into the skin at bedtime. For diabetes management Patient taking differently: Inject 14-20 Units into the skin at bedtime. For diabetes management 05/18/22 10/09/22  Pokhrel, Rebekah Chesterfield, MD  lamoTRIgine (LAMICTAL) 200 MG tablet Take 1 tablet by mouth daily.    [provider]  levocetirizine (XYZAL) 5 MG tablet Take 5 mg by mouth at bedtime. Patient not taking: Reported on 10/09/2022 02/19/22   [provider]  methocarbamol (ROBAXIN) 500 MG tablet Take 1 tablet (500 mg total) by mouth 2 (two) times daily as needed for muscle spasms. 09/18/22   Jacalyn Lefevre, MD  NOVOLOG 100 UNIT/ML injection Inject 5-20 Units into the skin 3 (three) times daily. 04/20/22   [provider]   pantoprazole (PROTONIX) 40 MG tablet Take 1 tablet (40 mg total) by mouth daily. 10/11/22   Erick Blinks, MD  risperiDONE (RISPERDAL) 1 MG tablet Take 1 tablet (1 mg total) by mouth at bedtime. 10/11/22 10/11/23  Erick Blinks, MD      Allergies    Trazodone and nefazodone    Review of Systems   Review of Systems  Physical Exam Updated Vital Signs BP (!) 141/73   Pulse 80   Temp 97.7 F (36.5 C) (Oral)   Resp (!) 23   Ht 6' (1.829 m)   Wt 80.3 kg   SpO2 100%   BMI 24.01 kg/m  Physical Exam Vitals and nursing note reviewed.  Constitutional:      General: He is not in acute distress.    Appearance: He is well-developed.  HENT:     Head: Normocephalic and atraumatic.     Mouth/Throat:     Mouth: Mucous membranes are moist.  Eyes:     General: Vision grossly intact. Gaze aligned appropriately.     Extraocular Movements: Extraocular movements intact.     Conjunctiva/sclera: Conjunctivae normal.  Cardiovascular:     Rate and Rhythm: Normal rate and regular rhythm.     Pulses: Normal pulses.     Heart sounds: Normal heart sounds, S1 normal and S2 normal. No murmur heard.    No friction rub. No gallop.  Pulmonary:     Effort: Pulmonary effort is normal. No respiratory distress.  Breath sounds: Normal breath sounds.  Abdominal:     Palpations: Abdomen is soft.     Tenderness: There is no abdominal tenderness. There is no guarding or rebound.     Hernia: No hernia is present.  Musculoskeletal:        General: No swelling.     Left forearm: Swelling and tenderness present.     Cervical back: Full passive range of motion without pain, normal range of motion and neck supple. No pain with movement, spinous process tenderness or muscular tenderness. Normal range of motion.     Right lower leg: No edema.     Left lower leg: No edema.  Skin:    General: Skin is warm and dry.     Capillary Refill: Capillary refill takes less than 2 seconds.     Findings: Erythema and  wound (Packing in place, surrounding erythema, induration and swelling) present. No ecchymosis or lesion.  Neurological:     Mental Status: He is alert and oriented to person, place, and time.     GCS: GCS eye subscore is 4. GCS verbal subscore is 5. GCS motor subscore is 6.     Cranial Nerves: Cranial nerves 2-12 are intact.     Sensory: Sensation is intact.     Motor: Motor function is intact. No weakness or abnormal muscle tone.     Coordination: Coordination is intact.  Psychiatric:        Mood and Affect: Mood normal.        Speech: Speech normal.        Behavior: Behavior normal.     ED Results / Procedures / Treatments   Labs (all labs ordered are listed, but only abnormal results are displayed) Labs Reviewed  COMPREHENSIVE METABOLIC PANEL - Abnormal; Notable for the following components:      Result Value   Sodium 129 (*)    Chloride 95 (*)    Glucose, Bld 660 (*)    BUN 36 (*)    Total Protein 8.2 (*)    AST 11 (*)    All other components within normal limits  CBC WITH DIFFERENTIAL/PLATELET - Abnormal; Notable for the following components:   WBC 12.8 (*)    RBC 3.44 (*)    Hemoglobin 9.9 (*)    HCT 29.7 (*)    Platelets 543 (*)    Neutro Abs 9.2 (*)    Eosinophils Absolute 0.7 (*)    All other components within normal limits  URINALYSIS, ROUTINE W REFLEX MICROSCOPIC - Abnormal; Notable for the following components:   Color, Urine STRAW (*)    Glucose, UA >=500 (*)    Ketones, ur 5 (*)    All other components within normal limits  CULTURE, BLOOD (ROUTINE X 2)  CULTURE, BLOOD (ROUTINE X 2)  LACTIC ACID, PLASMA  LACTIC ACID, PLASMA  PROTIME-INR  APTT  CBG MONITORING, ED    EKG EKG Interpretation  Date/Time:  Tuesday October 16 2022 01:47:59 EST Ventricular Rate:  79 PR Interval:  122 QRS Duration: 91 QT Interval:  384 QTC Calculation: 441 R Axis:   75 Text Interpretation: Sinus rhythm Left ventricular hypertrophy No significant change since last  tracing Confirmed by Gilda Crease [phone]) on 10/16/2022 3:01:33 AM  Radiology DG Chest Port 1 View  Result Date: 10/16/2022 CLINICAL DATA:  Questionable for sepsis. EXAM: PORTABLE CHEST 1 VIEW COMPARISON:  Mar 21, 2019 FINDINGS: The heart size and mediastinal contours are within normal limits. Low lung volumes  are noted. Both lungs are clear. The visualized skeletal structures are unremarkable. IMPRESSION: No active disease. Electronically Signed   By: Aram Candela M.D.   On: 10/16/2022 01:51    Procedures Procedures    Medications Ordered in ED Medications  insulin regular, human (MYXREDLIN) 100 units/ 100 mL infusion (has no administration in time range)  lactated ringers infusion (has no administration in time range)  dextrose 5 % in lactated ringers infusion (0 mLs Intravenous Hold 10/16/22 0334)  dextrose 50 % solution 0-50 mL (has no administration in time range)  lactated ringers bolus 1,000 mL (has no administration in time range)  vancomycin (VANCOREADY) IVPB 1750 mg/350 mL (has no administration in time range)  iohexol (OMNIPAQUE) 300 MG/ML solution 75 mL (75 mLs Intravenous Contrast Given 10/16/22 0232)    ED Course/ Medical Decision Making/ A&P                           Medical Decision Making Amount and/or Complexity of Data Reviewed External Data Reviewed: labs and notes. Labs: ordered. Decision-making details documented in ED Course. Radiology: ordered and independent interpretation performed. Decision-making details documented in ED Course. ECG/medicine tests: ordered and independent interpretation performed. Decision-making details documented in ED Course.  Risk Prescription drug management.   Presents with increased redness, swelling, induration and drainage from incision and drainage site of left forearm.  Patient is a type I diabetic.  He is also a known IV drug user but reports that he has not injected in that area.  He does report that he was  recently hospitalized and the pain started before discharge.  It is unknown if he had venipuncture performed in the site during his hospital stay.  Patient did have incision and drainage yesterday.  Culture pending, Gram stain with PMNs and gram-positive cocci in clusters.  CT scan performed.  No further abscess noted.  Patient with significant hyperglycemia, no evidence of DKA.  This is likely inhibiting healing process.  We will consider this a failure of outpatient therapy, admit patient for further management.        Final Clinical Impression(s) / ED Diagnoses Final diagnoses:  Cellulitis of left upper extremity    Rx / DC Orders ED Discharge Orders     None         Gilda Crease, MD 10/16/22 [phone]

## 2022-10-16 NOTE — ED Triage Notes (Signed)
Pt here for c/o L arm infection d/t abscess. Was seen here last night for same and abscess was lanced here in ED. Pt started "squeezing pus" out of wound. Returns for evaluation as a result.

## 2022-10-16 NOTE — TOC Initial Note (Signed)
Transition of Care [facility]) - Initial/Assessment Note    Patient Details  Name: Tyler Mann MRN: [id number] Date of Birth: 1981/05/05  Transition of Care [facility]) CM/SW Contact:    Villa Herb, LCSWA Phone Number: 10/16/2022, 12:38 PM  Clinical Narrative:                 Pt high risk for readmission. Pt from home. Substance use resources added to AVS at last admission. Will add resources to AVS again at this time. TOC to follow.   Expected Discharge Plan: Home/Self Care Barriers to Discharge: Continued Medical Work up   Patient Goals and CMS Choice Patient states their goals for this hospitalization and ongoing recovery are:: get better CMS Medicare.gov Compare Post Acute Care list provided to:: Patient Choice offered to / list presented to : Patient  Expected Discharge Plan and Services Expected Discharge Plan: Home/Self Care In-house Referral: Clinical Social Work Discharge Planning Services: CM Consult                                          Prior Living Arrangements/Services   Lives with:: Self Patient language and need for interpreter reviewed:: Yes Do you feel safe going back to the place where you live?: Yes      Need for Family Participation in Patient Care: Yes (Comment) Care giver support system in place?: Yes (comment)   Criminal Activity/Legal Involvement Pertinent to Current Situation/Hospitalization: No - Comment as needed  Activities of Daily Living Home Assistive Devices/Equipment: CBG Meter ADL Screening (condition at time of admission) Patient's cognitive ability adequate to safely complete daily activities?: Yes Is the patient deaf or have difficulty hearing?: No Does the patient have difficulty seeing, even when wearing glasses/contacts?: No Does the patient have difficulty concentrating, remembering, or making decisions?: No Patient able to express need for assistance with ADLs?: Yes Does the patient have difficulty dressing or  bathing?: No Independently performs ADLs?: Yes (appropriate for developmental age) Does the patient have difficulty walking or climbing stairs?: No Weakness of Legs: None Weakness of Arms/Hands: None  Permission Sought/Granted                  Emotional Assessment Appearance:: Appears stated age       Alcohol / Substance Use: Not Applicable Psych Involvement: No (comment)  Admission diagnosis:  Cellulitis of left upper extremity [L03.114] Patient Active Problem List   Diagnosis Date Noted   Cellulitis of left upper extremity 10/16/2022   Abscess of left forearm 10/16/2022   Uncontrolled type 1 diabetes mellitus with hyperglycemia, with long-term current use of insulin (HCC) 10/16/2022   Pseudohyponatremia 10/16/2022   GERD (gastroesophageal reflux disease) 10/16/2022   AKI (acute kidney injury) (HCC) 10/09/2022   Opioid dependence with intoxication with complication (HCC)    Medication overdose    Major depressive disorder, recurrent, severe without psychotic features (HCC)    MDD (major depressive disorder), recurrent episode, severe (HCC) 12/30/2014   Drug overdose, intentional (HCC) 12/26/2014   Hypoglycemia 12/26/2014   Overdose of benzodiazepine    Overdose of insulin    Substance induced mood disorder (HCC) 12/08/2014   Hyperglycemia without ketosis    Diabetes type 1, uncontrolled    Suicidal ideation 12/05/2014    Class: Acute   Hyperglycemia 12/03/2014   Polysubstance abuse (HCC) 12/03/2014   Hypokalemia 11/03/2014   Leukocytosis 11/02/2014   Hyperkalemia 11/02/2014  Chronic back pain 11/02/2014   Hematemesis 11/02/2014   Generalized anxiety disorder 11/02/2014   DKA (diabetic ketoacidoses) 11/19/2013   DKA, type 1 (HCC) 11/22/2012   Opiate dependence (HCC) 11/22/2012   Tobacco abuse 11/22/2012   Diabetic nephropathy (HCC) 11/22/2012   Diabetic ketoacidosis (HCC) 09/20/2012   Diabetes mellitus type 1 (HCC) 09/20/2012   Chronic pain 09/20/2012    Hyperlipidemia 09/20/2012   PCP:  Benita Stabile, MD Pharmacy:   [facility] [phone] - [place], [place] - [address] [place] [address] Phone: [phone] Fax: [phone]  [id number] - [place], [place] - [address]  [place] [address] Phone: [phone] Fax: [phone]     Social Determinants of Health (SDOH) Interventions    Readmission Risk Interventions    10/16/2022   12:37 PM  Readmission Risk Prevention Plan  Transportation Screening Complete  HRI or Home Care Consult Complete  Social Work Consult for Recovery Care Planning/Counseling Complete  Palliative Care Screening Not Applicable  Medication Review Oceanographer) Complete

## 2022-10-16 NOTE — Consult Note (Signed)
ORTHOPAEDIC CONSULTATION  REQUESTING PHYSICIAN: Bernadette Hoit, DO  ASSESSMENT AND PLAN: 41 y.o. male with the following: Left dorsal forearm abscess  Orthopedics recommends admission to a medical service and we will provide consultation and follow along.  I&D completed in the emergency department expressed purulence.  Packing was removed at the bedside, and we are able to express additional purulence.  Continue with daily soaks to prevent further accumulation of infection.  Continue with IV antibiotics.  Follow the cultures.  Patient is not ill-appearing, and there is no need for urgent surgery.  He does not need to be NPO.  I will continue to follow.  - Weight Bearing Status/Activity: Weightbearing as tolerated  - Additional recommended labs/tests: ESR and CRP so we can trend the values  -VTE Prophylaxis: As needed  - Pain control: As needed  - Follow-up plan: To be determined  -Procedures: Nothing at this time, but we will consider a bedside procedure as needed.  Chief Complaint: Left forearm swelling  HPI: MARKEL KURTENBACH is a 41 y.o. male who presented the emergency department for evaluation of the left forearm pain and swelling.  He states he started to notice some redness on the dorsal aspect of his forearm, approximately 1 week ago.  2 days prior to admission, he presented to emergency department.  The provider in the emergency department completed a bedside I&D.  They were able to express a lot of purulence.  The wound was packed.  He was given antibiotics.  However, the patient felt as though the swelling was accumulating almost immediately after discharge from the ED.  As result, return to the ED less than 48 hours after the initial I&D.  He has been admitted.  He is receiving antibiotics.  Since the antibiotics started, some of the redness has improved.  No fevers or chills.  He is not sure what has caused the initial swelling.  He does have a history of IV drug abuse, but  has not injected in that area.  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  Past Medical History:  Diagnosis Date   ADD (attention deficit disorder)    Bipolar disorder ([place])    Chronic back pain    Diabetes mellitus    type 1   Narcotic addiction ([place])    Hx IV heroin abuse   Neuropathy    Past Surgical History:  Procedure Laterality Date   INCISION AND DRAINAGE     MOUTH SURGERY     Social History   Socioeconomic History   Marital status: Legally Separated    Spouse name: Not on file   Number of children: Not on file   Years of education: Not on file   Highest education level: Not on file  Occupational History   Not on file  Tobacco Use   Smoking status: Every Day    Packs/day: 0.50    Years: 17.00    Total pack years: 8.50    Types: Cigarettes   Smokeless tobacco: Current  Vaping Use   Vaping Use: Never used  Substance and Sexual Activity   Alcohol use: Yes    Comment: occasionally    Drug use: Not Currently    Types: Methamphetamines, Heroin    Comment: recovering from heroin-    Sexual activity: Yes  Other Topics Concern   Not on file  Social History Narrative   Not on file   Social Determinants of  Health   Financial Resource Strain: Not on file  Food Insecurity: No Food Insecurity (10/16/2022)   Hunger Vital Sign    Worried About Running Out of Food in the Last Year: Never true    Ran Out of Food in the Last Year: Never true  Transportation Needs: No Transportation Needs (10/16/2022)   PRAPARE - Hydrologist (Medical): No    Lack of Transportation (Non-Medical): No  Physical Activity: Not on file  Stress: Not on file  Social Connections: Not on file   History reviewed. No pertinent family history. Allergies  Allergen Reactions   Trazodone And Nefazodone Other (See Comments)    Hallucinations and nightmares   Prior to Admission medications   Medication  Sig Start Date End Date Taking? Authorizing Provider  cyclobenzaprine (FLEXERIL) 10 MG tablet Take 10 mg by mouth 2 (two) times daily.   Yes [provider]  doxycycline (VIBRAMYCIN) 100 MG capsule Take 1 capsule (100 mg total) by mouth 2 (two) times daily. 10/14/22  Yes Noemi Chapel, MD  gabapentin (NEURONTIN) 600 MG tablet Take 600 mg by mouth 2 (two) times daily. 10/06/22  Yes [provider]  ibuprofen (ADVIL) 800 MG tablet Take 1 tablet by mouth 3 (three) times daily as needed.   Yes [provider]  lamoTRIgine (LAMICTAL) 200 MG tablet Take 1 tablet by mouth daily.   Yes [provider]  NOVOLOG 100 UNIT/ML injection Inject 5-20 Units into the skin 3 (three) times daily. 04/20/22  Yes [provider]  pantoprazole (PROTONIX) 40 MG tablet Take 1 tablet (40 mg total) by mouth daily. 10/11/22  Yes Kathie Dike, MD  risperiDONE (RISPERDAL) 1 MG tablet Take 1 tablet (1 mg total) by mouth at bedtime. 10/11/22 10/11/23 Yes Kathie Dike, MD  insulin detemir (LEVEMIR) 100 UNIT/ML injection Inject 0.14 mLs (14 Units total) into the skin at bedtime. For diabetes management Patient taking differently: Inject 14-20 Units into the skin at bedtime. For diabetes management 05/18/22 10/09/22  Flora Lipps, MD   ECHOCARDIOGRAM COMPLETE  Result Date: 10/16/2022    ECHOCARDIOGRAM REPORT   Patient Name:   MATTIE NOVOSEL [facility] Date of Exam: 10/16/2022 Medical Rec #:  [id number]       Height:       72.0 in Accession #:    [id number]      Weight:       180.7 lb Date of Birth:  09-Sep-1981      BSA:          2.041 m Patient Age:    50 years        BP:           158/86 mmHg Patient Gender: M               HR:           82 bpm. Exam Location:  [facility] Procedure: 2D Echo, Cardiac Doppler and Color Doppler Indications:    Endocarditis I38  History:        Patient has prior history of Echocardiogram examinations, most                 recent 06/20/2010. Risk Factors:Diabetes,  Current Smoker and                 Dyslipidemia. This admit for Abscess of left forearm,                 Polysubstance abuse ([place]), Bipolar disorder (  [place]) (From Hx).  Sonographer:    Alvino Chapel RCS Referring Phys: [id number] OLADAPO ADEFESO IMPRESSIONS  1. Left ventricular ejection fraction, by estimation, is 60 to 65%. The left ventricle has normal function. The left ventricle has no regional wall motion abnormalities. Left ventricular diastolic parameters were normal.  2. Right ventricular systolic function is normal. The right ventricular size is mildly enlarged. There is normal pulmonary artery systolic pressure.  3. Right atrial size was mildly dilated.  4. The mitral valve is grossly normal. Trivial mitral valve regurgitation. No evidence of mitral stenosis.  5. The aortic valve is grossly normal. Aortic valve regurgitation is not visualized. No aortic stenosis is present.  6. The inferior vena cava is normal in size with greater than 50% respiratory variability, suggesting right atrial pressure of 3 mmHg. Conclusion(s)/Recommendation(s): No evidence of valvular vegetations on this transthoracic echocardiogram. Consider a transesophageal echocardiogram to exclude infective endocarditis if clinically indicated. FINDINGS  Left Ventricle: Left ventricular ejection fraction, by estimation, is 60 to 65%. The left ventricle has normal function. The left ventricle has no regional wall motion abnormalities. The left ventricular internal cavity size was normal in size. There is  no left ventricular hypertrophy. Left ventricular diastolic parameters were normal. Right Ventricle: The right ventricular size is mildly enlarged. No increase in right ventricular wall thickness. Right ventricular systolic function is normal. There is normal pulmonary artery systolic pressure. The tricuspid regurgitant velocity is 2.86  m/s, and with an assumed right atrial pressure of 3 mmHg, the estimated right ventricular systolic pressure  is 44.3 mmHg. Left Atrium: Left atrial size was normal in size. Right Atrium: Right atrial size was mildly dilated. Pericardium: There is no evidence of pericardial effusion. Mitral Valve: The mitral valve is grossly normal. Trivial mitral valve regurgitation. No evidence of mitral valve stenosis. Tricuspid Valve: The tricuspid valve is grossly normal. Tricuspid valve regurgitation is not demonstrated. No evidence of tricuspid stenosis. Aortic Valve: The aortic valve is grossly normal. Aortic valve regurgitation is not visualized. No aortic stenosis is present. Pulmonic Valve: The pulmonic valve was grossly normal. Pulmonic valve regurgitation is trivial. No evidence of pulmonic stenosis. Aorta: The aortic root is normal in size and structure. Venous: The inferior vena cava is normal in size with greater than 50% respiratory variability, suggesting right atrial pressure of 3 mmHg. IAS/Shunts: No atrial level shunt detected by color flow Doppler.  LEFT VENTRICLE PLAX 2D LVIDd:         4.70 cm   Diastology LVIDs:         3.00 cm   LV e' medial:    10.00 cm/s LV PW:         1.10 cm   LV E/e' medial:  11.5 LV IVS:        1.00 cm   LV e' lateral:   12.20 cm/s LVOT diam:     1.80 cm   LV E/e' lateral: 9.4 LV SV:         75 LV SV Index:   37 LVOT Area:     2.54 cm  RIGHT VENTRICLE RV S prime:     16.50 cm/s TAPSE (M-mode): 2.1 cm LEFT ATRIUM             Index        RIGHT ATRIUM           Index LA diam:        3.80 cm 1.86 cm/m   RA Area:  19.60 cm LA Vol (A2C):   64.4 ml 31.56 ml/m  RA Volume:   61.00 ml  29.89 ml/m LA Vol (A4C):   53.2 ml 26.07 ml/m LA Biplane Vol: 59.6 ml 29.21 ml/m  AORTIC VALVE LVOT Vmax:   123.00 cm/s LVOT Vmean:  86.800 cm/s LVOT VTI:    0.295 m  AORTA Ao Root diam: 3.00 cm MITRAL VALVE                TRICUSPID VALVE MV Area (PHT): 3.27 cm     TR Peak grad:   32.7 mmHg MV Decel Time: 232 msec     TR Vmax:        286.00 cm/s MV E velocity: 115.00 cm/s MV A velocity: 91.70 cm/s   SHUNTS MV  E/A ratio:  1.25         Systemic VTI:  0.30 m                             Systemic Diam: 1.80 cm Vishnu Priya Mallipeddi Electronically signed by Lorelee Cover Mallipeddi Signature Date/Time: 10/16/2022/11:09:36 AM    Final    CT FOREARM LEFT W CONTRAST  Result Date: 10/16/2022 CLINICAL DATA:  Left forearm abscess, check for deep soft tissue infection, seen last night in the ED for the same abnormality and underwent abscess drainage at that time. EXAM: CT OF THE UPPER LEFT EXTREMITY WITH CONTRAST TECHNIQUE: Multidetector CT imaging of the upper left extremity was performed according to the standard protocol following intravenous contrast administration. RADIATION DOSE REDUCTION: This exam was performed according to the departmental dose-optimization program which includes automated exposure control, adjustment of the mA and/or kV according to patient size and/or use of iterative reconstruction technique. CONTRAST:  27m OMNIPAQUE IOHEXOL 300 MG/ML  SOLN COMPARISON:  None Available. FINDINGS: Bones/Joint/Cartilage There is normal bone mineralization without evidence of fracture, dislocation or primary pathologic bone lesion. Arthritic changes are not seen. No joint effusions are seen. Ligaments Suboptimally assessed by CT. Muscles and Tendons There is moderate superficial edema in the dorsolateral aspect of the mid forearm, extending down to the brachioradialis muscle with loss of definition between the fat and the muscle. Underlying myositis or pyomyositis would be difficult to exclude but no discrete abscess is seen in the muscle itself. Other imaged muscle groups are unremarkable, as visualized. No intramuscular or intermuscular soft tissue gas or focal fluid collections are seen. Area tendons are not well seen with this technique but intact as far as visualized. Soft tissues Skin thickening and underlying edema are noted in the dorsolateral aspect of the mid forearm. There is dense packing material within a  small cavity underlying the skin surface in this area, the cavity estimated 1.3 x 1.3 cm AP and transverse and 1.9 cm craniocaudal. This is consistent with a packed abscess cavity. There is scattered nonlocalizing fluid along fat muscle interfaces underlying the edema but no organized fluid collection. Vascular structures demonstrate enhancement. There are calcifications in the radial and ulnar arteries extending into the wrist. IMPRESSION: 1. 1.3 x 1.3 x 1.9 cm abscess cavity in the dorsolateral aspect of the mid forearm, with dense packing material in the cavity. 2. Moderate superficial edema in the dorsolateral aspect of the mid forearm, extending down to the brachioradialis muscle with loss of definition between the fat and the muscle. Underlying myositis or pyomyositis would be difficult to exclude but no discrete abscess is seen in the muscle itself. 3.  No deep soft tissue fluid collections or free air. Absence of findings of a necrotizing infection on CT however, should not delay surgical exploration if there is sufficient clinical concern. 4. Vascular calcifications. Electronically Signed   By: Telford Nab M.D.   On: 10/16/2022 03:39   DG Chest Port 1 View  Result Date: 10/16/2022 CLINICAL DATA:  Questionable for sepsis. EXAM: PORTABLE CHEST 1 VIEW COMPARISON:  Mar 21, 2019 FINDINGS: The heart size and mediastinal contours are within normal limits. Low lung volumes are noted. Both lungs are clear. The visualized skeletal structures are unremarkable. IMPRESSION: No active disease. Electronically Signed   By: Virgina Norfolk M.D.   On: 10/16/2022 01:51    Family History Reviewed and non-contributory, no pertinent history of problems with bleeding or anesthesia    Review of Systems No fevers or chills No numbness or tingling No chest pain No shortness of breath No bowel or bladder dysfunction No GI distress No headaches    OBJECTIVE  Vitals:Patient Vitals for the past 8 hrs:  BP  Temp Temp src Pulse Resp SpO2  10/16/22 1600 (!) 143/77 98.2 F (36.8 C) Oral 80 20 100 %  10/16/22 1246 (!) 145/77 98 F (36.7 C) Oral 81 19 98 %   General: Alert, no acute distress Cardiovascular: Warm extremities noted Respiratory: No cyanosis, no use of accessory musculature GI: No organomegaly, abdomen is soft and non-tender Skin: No lesions in the area of chief complaint other than those listed below in MSK exam.  Neurologic: Sensation intact distally save for the below mentioned MSK exam Psychiatric: Patient is competent for consent with normal mood and affect Lymphatic: No swelling obvious and reported other than the area involved in the exam below Extremities  \ LUE: Evaluation of the left upper extremity demonstrates an area of fluctuance.  This is circumscribed.  There is redness.  This area is raised and firm.  There is a small, approximately 1 cm laceration directly over this swollen area.  Leaking was removed, and some additional purulence was expressed.  Proceeded to irrigate with 1 L of normal saline, and attempted to break up some loculations.  He tolerated palpation, but did have some tenderness.  Full function of the left hand.  Fingers are warm and well-perfused.  Sensation is intact throughout the left hand.  He is able to flex and extend the elbow without difficulty.    Test Results Imaging  CT left forearm  IMPRESSION: 1. 1.3 x 1.3 x 1.9 cm abscess cavity in the dorsolateral aspect of the mid forearm, with dense packing material in the cavity. 2. Moderate superficial edema in the dorsolateral aspect of the mid forearm, extending down to the brachioradialis muscle with loss of definition between the fat and the muscle. Underlying myositis or pyomyositis would be difficult to exclude but no discrete abscess is seen in the muscle itself. 3. No deep soft tissue fluid collections or free air. Absence of findings of a necrotizing infection on CT however, should not  delay surgical exploration if there is sufficient clinical concern. 4. Vascular calcifications.  Labs cbc Recent Labs    10/16/22 0137  WBC 12.8*  HGB 9.9*  HCT 29.7*  PLT 543*    Labs inflam Recent Labs    10/16/22 0831  CRP 4.6*    Labs coag Recent Labs    10/16/22 0137  INR 0.9    Recent Labs    10/16/22 0137 10/16/22 0829  NA 129*  --   K  4.5  --   CL 95*  --   CO2 22  --   GLUCOSE 660* 388*  BUN 36*  --   CREATININE 1.18  --   CALCIUM 8.9  --

## 2022-10-17 DIAGNOSIS — L03114 Cellulitis of left upper limb: Secondary | ICD-10-CM | POA: Diagnosis not present

## 2022-10-17 LAB — COMPREHENSIVE METABOLIC PANEL
ALT: 15 U/L (ref 0–44)
AST: 12 U/L — ABNORMAL LOW (ref 15–41)
Albumin: 2.8 g/dL — ABNORMAL LOW (ref 3.5–5.0)
Alkaline Phosphatase: 86 U/L (ref 38–126)
Anion gap: 6 (ref 5–15)
BUN: 16 mg/dL (ref 6–20)
CO2: 24 mmol/L (ref 22–32)
Calcium: 8.9 mg/dL (ref 8.9–10.3)
Chloride: 108 mmol/L (ref 98–111)
Creatinine, Ser: 0.67 mg/dL (ref 0.61–1.24)
GFR, Estimated: >60 mL/min (ref >60)
Glucose, Bld: 60 mg/dL — ABNORMAL LOW (ref 70–99)
Potassium: 3.5 mmol/L (ref 3.5–5.1)
Sodium: 138 mmol/L (ref 135–145)
Total Bilirubin: 0.3 mg/dL (ref 0.3–1.2)
Total Protein: 7.2 g/dL (ref 6.5–8.1)

## 2022-10-17 LAB — AEROBIC CULTURE W GRAM STAIN (SUPERFICIAL SPECIMEN)

## 2022-10-17 LAB — GLUCOSE, CAPILLARY
Glucose-Capillary: 57 mg/dL — ABNORMAL LOW (ref 70–99)
Glucose-Capillary: 130 mg/dL — ABNORMAL HIGH (ref 70–99)
Glucose-Capillary: 401 mg/dL — ABNORMAL HIGH (ref 70–99)
Glucose-Capillary: 244 mg/dL — ABNORMAL HIGH (ref 70–99)
Glucose-Capillary: 74 mg/dL (ref 70–99)
Glucose-Capillary: 212 mg/dL — ABNORMAL HIGH (ref 70–99)

## 2022-10-17 LAB — CBC
HCT: 31.6 % — ABNORMAL LOW (ref 39.0–52.0)
Hemoglobin: 10.3 g/dL — ABNORMAL LOW (ref 13.0–17.0)
MCH: 28.2 pg (ref 26.0–34.0)
MCHC: 32.6 g/dL (ref 30.0–36.0)
MCV: 86.6 fL (ref 80.0–100.0)
Platelets: 550 10*3/uL — ABNORMAL HIGH (ref 150–400)
RBC: 3.65 MIL/uL — ABNORMAL LOW (ref 4.22–5.81)
RDW: 13.5 % (ref 11.5–15.5)
WBC: 9 10*3/uL (ref 4.0–10.5)
nRBC: 0 % (ref 0.0–0.2)

## 2022-10-17 LAB — PHOSPHORUS: Phosphorus: 3.7 mg/dL (ref 2.5–4.6)

## 2022-10-17 LAB — MAGNESIUM: Magnesium: 1.8 mg/dL (ref 1.7–2.4)

## 2022-10-17 MED ORDER — INSULIN ASPART 100 UNIT/ML IJ SOLN
4.0000 [IU] | Freq: Three times a day (TID) | INTRAMUSCULAR | Status: DC
  Administered 2022-10-17 (×2): 4 [IU] via SUBCUTANEOUS

## 2022-10-17 MED ORDER — INSULIN DETEMIR 100 UNIT/ML ~~LOC~~ SOLN
10.0000 [IU] | Freq: Two times a day (BID) | SUBCUTANEOUS | Status: DC
  Administered 2022-10-17 – 2022-10-18 (×3): 10 [IU] via SUBCUTANEOUS
  Filled 2022-10-17 (×5): qty 0.1

## 2022-10-17 MED ORDER — INSULIN ASPART 100 UNIT/ML IJ SOLN
20.0000 [IU] | Freq: Once | INTRAMUSCULAR | Status: AC
  Administered 2022-10-17: 20 [IU] via SUBCUTANEOUS

## 2022-10-17 NOTE — Progress Notes (Signed)
Advised by NT that pt's current blood sugar is 57 mg/dl. Pt alert and oriented, no s/s hypoglycemia. Pt given 2 cups juice to drink and advised that we will recheck his blood sugar in 15 minutes. Pt states understanding.

## 2022-10-17 NOTE — Inpatient Diabetes Management (Signed)
Inpatient Diabetes Program Recommendations  AACE/ADA: New Consensus Statement on Inpatient Glycemic Control   Target Ranges:  Prepandial:   less than 140 mg/dL      Peak postprandial:   less than 180 mg/dL (1-2 hours)      Critically ill patients:  140 - 180 mg/dL    Latest Reference Range & Units 10/17/22 07:22 10/17/22 07:51  Glucose-Capillary 70 - 99 mg/dL 57 (L) 643 (H)    Latest Reference Range & Units 10/16/22 08:10 10/16/22 11:37 10/16/22 17:00 10/16/22 20:44  Glucose-Capillary 70 - 99 mg/dL 329 (H) 518 (H) 841 (H) 266 (H)   Review of Glycemic Control  Diabetes history: DM1 Outpatient Diabetes medications: Levemir 14-20 units QHS, Novolog 5-20 units TID with meals Current orders for Inpatient glycemic control: Levemir 10 units BID, Novolog 0-15 units TID with meals, Novolog 0-5 units QHS, Novolog 5 units TID with meals   Inpatient Diabetes Program Recommendations:    Insulin: Fasting glucose 57 mg/dl today.  Please consider decreasing Levemir to 8 units BID.  Thanks, Orlando Penner, RN, MSN, CDCES Diabetes Coordinator Inpatient Diabetes Program [phone] (Team Pager from 8am to 5pm)

## 2022-10-17 NOTE — Progress Notes (Signed)
MD Courage notified of elevated CBG of 401 mg/dl. He advises to need to draw glucose verification and order received for novolog coverage of blood sugar.

## 2022-10-17 NOTE — Progress Notes (Signed)
PROGRESS NOTE     Tyler Mann, is a 41 y.o. male, DOB - 05-09-1981, RXV:[id number]  Admit date - 10/16/2022   Admitting Physician Barton Dubois, MD  Outpatient Primary MD for the patient is Celene Squibb, MD  LOS - 1  Chief Complaint  Patient presents with   Arm Pain        Brief Narrative:  41 y.o. male with medical history significant of T1DM, diabetic nephropathy, opiate dependence, tobacco abuse, depression, neuropathy, polysubstance abuse admitted with Lt arm cellulitis/abscess on 10/16/22    -Assessment and Plan: 1)MRSA Cellulitis and Abscess of Lt Forearm  -Failed outpatient treatment after being previously seen in the ED for I&D -Stop IV Rocephin -Continue IV vancomycin -Elevated CRP and ESR noted -Possible discharge home in 24 hours or so if continues to improve -WBC improving -Blood cultures NGTD  -Aerobic wound culture with MRSA -TTE without endocarditis preserved EF noted no significant valvular normalities -No need for TEE given negative blood cultures.   2)DM1- A1c 10.4 reflecting uncontrolled diabetes with hyperglycemia PTA -erratic blood sugars with hypo and hyperglycemia -Diabetic educator input appreciated -Insulin regimen adjusted   3)History of polysubstance abuse and tobacco abuse Patient denies use of any substance abuse at this time He was counseled on cessation of tobacco abuse   4)GERD Continue Protonix  Disposition/Need for in-Hospital Stay- patient unable to be discharged at this time due to left forearm cellulitis failed outpatient antibiotics requiring IV vancomycin  Status is: Inpatient   Disposition: The patient is from: Home              Anticipated d/c is to: Home              Anticipated d/c date is: 1 day              Patient currently is not medically stable to d/c. Barriers: Not Clinically Stable-   Code Status :  -  Code Status: Full Code   Family Communication:    NA (patient is alert, awake and coherent)   DVT  Prophylaxis  :   - SCDs   enoxaparin (LOVENOX) injection 40 mg Start: 10/16/22 1000 SCDs Start: 10/16/22 0815   Lab Results  Component Value Date   PLT 550 (H) 10/17/2022    Inpatient Medications  Scheduled Meds:  enoxaparin (LOVENOX) injection  40 mg Subcutaneous Q24H   gabapentin  600 mg Oral BID   insulin aspart  0-15 Units Subcutaneous TID WC   insulin aspart  0-5 Units Subcutaneous QHS   insulin aspart  4 Units Subcutaneous TID WC   insulin detemir  10 Units Subcutaneous BID   lamoTRIgine  200 mg Oral Daily   pantoprazole  40 mg Oral Daily   risperiDONE  1 mg Oral QHS   Continuous Infusions:  dextrose 5% lactated ringers 125 mL/hr at 10/17/22 0910   lactated ringers Stopped (10/17/22 0908)   vancomycin 1,250 mg (10/17/22 0428)   PRN Meds:.acetaminophen **OR** acetaminophen, dextrose, ondansetron **OR** ondansetron (ZOFRAN) IV   Anti-infectives (From admission, onward)    Start     Dose/Rate Route Frequency Ordered Stop   10/16/22 1700  vancomycin (VANCOREADY) IVPB 1250 mg/250 mL        1,250 mg 166.7 mL/hr over 90 Minutes Intravenous Every 12 hours 10/16/22 1122     10/16/22 1030  cefTRIAXone (ROCEPHIN) 2 g in sodium chloride 0.9 % 100 mL IVPB  Status:  Discontinued        2 g 200  mL/hr over 30 Minutes Intravenous Every 24 hours 10/16/22 0952 10/17/22 1157   10/16/22 0730  vancomycin (VANCOCIN) IVPB 1000 mg/200 mL premix  Status:  Discontinued        1,000 mg 200 mL/hr over 60 Minutes Intravenous  Once 10/16/22 0729 10/16/22 0737   10/16/22 0345  vancomycin (VANCOREADY) IVPB 1750 mg/350 mL        1,750 mg 175 mL/hr over 120 Minutes Intravenous  Once 10/16/22 0335 10/16/22 0557         Subjective: Tyler Mann today has no fevers, no emesis,  No chest pain,   - Left arm discomfort swelling and erythema as per patient is Not worse   Objective: Vitals:   10/16/22 1600 10/16/22 2042 10/17/22 0403 10/17/22 1522  BP: (!) 143/77 (!) 149/84 139/86 (!) 141/86   Pulse: 80 85 77 87  Resp: _0 Temp: 98.2 F (36.8 C) 98.6 F (37 C) 97.9 F (36.6 C) 97.8 F (36.6 C)  TempSrc: Oral  Oral Oral  SpO2: 100% 100% 98% 99%  Weight:      Height:        Intake/Output Summary (Last 24 hours) at 10/17/2022 1728 Last data filed at 10/17/2022 1300 Gross per 24 hour  Intake 3070 ml  Output 2 ml  Net 3068 ml   Filed Weights   10/16/22 0109 10/16/22 0807  Weight: 80.3 kg 82 kg    Physical Exam  Gen:- Awake Alert,  in no apparent distress  HEENT:- [place].AT, No sclera icterus Neck-Supple Neck,No JVD,.  Lungs-  CTAB , fair symmetrical air movement CV- S1, S2 normal, regular  Abd-  +ve B.Sounds, Abd Soft, No tenderness,    Extremity/Skin:- No  edema, pedal pulses present  Psych-affect is appropriate, oriented x3 Neuro-no new focal deficits, no tremors MSK-left forearm open wound with area of induration, erythema swelling and tenderness and drainage--- as per patient this is not worse  Data Reviewed: I have personally reviewed following labs and imaging studies  CBC: Recent Labs  Lab 10/11/22 0329 10/16/22 0137 10/17/22 0542  WBC 9.8 12.8* 9.0  NEUTROABS  --  9.2*  --   HGB 9.2* 9.9* 10.3*  HCT 29.5* 29.7* 31.6*  MCV 90.8 86.3 86.6  PLT 402* 543* 597*   Basic Metabolic Panel: Recent Labs  Lab 10/11/22 0329 10/16/22 0137 10/16/22 0829 10/17/22 0542  NA 140 129*  --  138  K 4.1 4.5  --  3.5  CL 111 95*  --  108  CO2 25 22  --  24  GLUCOSE 128* 660* 388* 60*  BUN 9 36*  --  16  CREATININE 0.90 1.18  --  0.67  CALCIUM 8.4* 8.9  --  8.9  MG  --   --   --  1.8  PHOS  --   --   --  3.7   GFR: Estimated Creatinine Clearance: 130.7 mL/min (by C-G formula based on SCr of 0.67 mg/dL). Liver Function Tests: Recent Labs  Lab 10/16/22 0137 10/17/22 0542  AST 11* 12*  ALT 21 15  ALKPHOS 126 86  BILITOT 0.5 0.3  PROT 8.2* 7.2  ALBUMIN 3.6 2.8*   Cardiac Enzymes: Recent Labs  Lab 10/11/22 0329  CKTOTAL 413*   Recent  Results (from the past 240 hour(s))  Aerobic Culture w Gram Stain (superficial specimen)     Status: None   Collection Time: 10/14/22  8:15 PM   Specimen: Abscess  Result Value Ref Range  Status   Specimen Description   Final    ABSCESS Performed at [facility], [address], [place], [place] [address]    Special Requests   Final    NONE Performed at [facility], [address] [place], [place] [address]    Gram Stain   Final    MODERATE WBC PRESENT, PREDOMINANTLY PMN FEW GRAM POSITIVE COCCI IN CLUSTERS Performed at [facility], [place] [address] [place], [place] [address]    Culture   Final    ABUNDANT METHICILLIN RESISTANT STAPHYLOCOCCUS AUREUS   Report Status 10/17/2022 FINAL  Final   Organism ID, Bacteria METHICILLIN RESISTANT STAPHYLOCOCCUS AUREUS  Final      Susceptibility   Methicillin resistant staphylococcus aureus - MIC*    CIPROFLOXACIN >=8 RESISTANT Resistant     ERYTHROMYCIN >=8 RESISTANT Resistant     GENTAMICIN <=0.5 SENSITIVE Sensitive     OXACILLIN >=4 RESISTANT Resistant     TETRACYCLINE <=1 SENSITIVE Sensitive     VANCOMYCIN <=0.5 SENSITIVE Sensitive     TRIMETH/SULFA >=320 RESISTANT Resistant     CLINDAMYCIN <=0.25 SENSITIVE Sensitive     RIFAMPIN <=0.5 SENSITIVE Sensitive     Inducible Clindamycin NEGATIVE Sensitive     * ABUNDANT METHICILLIN RESISTANT STAPHYLOCOCCUS AUREUS  Blood Culture (routine x 2)     Status: None (Preliminary result)   Collection Time: 10/16/22  1:37 AM   Specimen: BLOOD RIGHT FOREARM  Result Value Ref Range Status   Specimen Description BLOOD RIGHT FOREARM  Final   Special Requests   Final    BOTTLES DRAWN AEROBIC AND ANAEROBIC Blood Culture adequate volume   Culture   Final    NO GROWTH 1 DAY Performed at [facility], [address], [place], [place] [address]    Report Status PENDING  Incomplete  Blood Culture (routine x 2)     Status: None (Preliminary result)   Collection Time: 10/16/22  2:25 AM   Specimen: BLOOD   Result Value Ref Range Status   Specimen Description BLOOD RIGHT ANTECUBITAL  Final   Special Requests   Final    BOTTLES DRAWN AEROBIC AND ANAEROBIC Blood Culture results may not be optimal due to an excessive volume of blood received in culture bottles   Culture   Final    NO GROWTH 1 DAY Performed at [facility], [address] [place],  [address]    Report Status PENDING  Incomplete    Radiology Studies: ECHOCARDIOGRAM COMPLETE  Result Date: 10/16/2022    ECHOCARDIOGRAM REPORT   Patient Name:   Tyler Mann [facility] Date of Exam: 10/16/2022 Medical Rec #:  [id number]       Height:       72.0 in Accession #:    [id number]      Weight:       180.7 lb Date of Birth:  03-03-1981      BSA:          2.041 m Patient Age:    56 years        BP:           158/86 mmHg Patient Gender: M               HR:           82 bpm. Exam Location:  [facility] Procedure: 2D Echo, Cardiac Doppler and Color Doppler Indications:    Endocarditis I38  History:        Patient has prior history of  Echocardiogram examinations, most                 recent 06/20/2010. Risk Factors:Diabetes, Current Smoker and                 Dyslipidemia. This admit for Abscess of left forearm,                 Polysubstance abuse ([facility]), Bipolar disorder ([place]) (From Hx).  Sonographer:    Alvino Chapel RCS Referring Phys: [id number] OLADAPO ADEFESO IMPRESSIONS  1. Left ventricular ejection fraction, by estimation, is 60 to 65%. The left ventricle has normal function. The left ventricle has no regional wall motion abnormalities. Left ventricular diastolic parameters were normal.  2. Right ventricular systolic function is normal. The right ventricular size is mildly enlarged. There is normal pulmonary artery systolic pressure.  3. Right atrial size was mildly dilated.  4. The mitral valve is grossly normal. Trivial mitral valve regurgitation. No evidence of mitral stenosis.  5. The aortic valve is grossly normal. Aortic valve regurgitation is not  visualized. No aortic stenosis is present.  6. The inferior vena cava is normal in size with greater than 50% respiratory variability, suggesting right atrial pressure of 3 mmHg. Conclusion(s)/Recommendation(s): No evidence of valvular vegetations on this transthoracic echocardiogram. Consider a transesophageal echocardiogram to exclude infective endocarditis if clinically indicated. FINDINGS  Left Ventricle: Left ventricular ejection fraction, by estimation, is 60 to 65%. The left ventricle has normal function. The left ventricle has no regional wall motion abnormalities. The left ventricular internal cavity size was normal in size. There is  no left ventricular hypertrophy. Left ventricular diastolic parameters were normal. Right Ventricle: The right ventricular size is mildly enlarged. No increase in right ventricular wall thickness. Right ventricular systolic function is normal. There is normal pulmonary artery systolic pressure. The tricuspid regurgitant velocity is 2.86  m/s, and with an assumed right atrial pressure of 3 mmHg, the estimated right ventricular systolic pressure is 59.5 mmHg. Left Atrium: Left atrial size was normal in size. Right Atrium: Right atrial size was mildly dilated. Pericardium: There is no evidence of pericardial effusion. Mitral Valve: The mitral valve is grossly normal. Trivial mitral valve regurgitation. No evidence of mitral valve stenosis. Tricuspid Valve: The tricuspid valve is grossly normal. Tricuspid valve regurgitation is not demonstrated. No evidence of tricuspid stenosis. Aortic Valve: The aortic valve is grossly normal. Aortic valve regurgitation is not visualized. No aortic stenosis is present. Pulmonic Valve: The pulmonic valve was grossly normal. Pulmonic valve regurgitation is trivial. No evidence of pulmonic stenosis. Aorta: The aortic root is normal in size and structure. Venous: The inferior vena cava is normal in size with greater than 50% respiratory variability,  suggesting right atrial pressure of 3 mmHg. IAS/Shunts: No atrial level shunt detected by color flow Doppler.  LEFT VENTRICLE PLAX 2D LVIDd:         4.70 cm   Diastology LVIDs:         3.00 cm   LV e' medial:    10.00 cm/s LV PW:         1.10 cm   LV E/e' medial:  11.5 LV IVS:        1.00 cm   LV e' lateral:   12.20 cm/s LVOT diam:     1.80 cm   LV E/e' lateral: 9.4 LV SV:         75 LV SV Index:   37 LVOT Area:     2.54 cm  RIGHT VENTRICLE RV S prime:     16.50 cm/s TAPSE (M-mode): 2.1 cm LEFT ATRIUM             Index        RIGHT ATRIUM           Index LA diam:        3.80 cm 1.86 cm/m   RA Area:     19.60 cm LA Vol (A2C):   64.4 ml 31.56 ml/m  RA Volume:   61.00 ml  29.89 ml/m LA Vol (A4C):   53.2 ml 26.07 ml/m LA Biplane Vol: 59.6 ml 29.21 ml/m  AORTIC VALVE LVOT Vmax:   123.00 cm/s LVOT Vmean:  86.800 cm/s LVOT VTI:    0.295 m  AORTA Ao Root diam: 3.00 cm MITRAL VALVE                TRICUSPID VALVE MV Area (PHT): 3.27 cm     TR Peak grad:   32.7 mmHg MV Decel Time: 232 msec     TR Vmax:        286.00 cm/s MV E velocity: 115.00 cm/s MV A velocity: 91.70 cm/s   SHUNTS MV E/A ratio:  1.25         Systemic VTI:  0.30 m                             Systemic Diam: 1.80 cm Vishnu Priya Mallipeddi Electronically signed by Lorelee Cover Mallipeddi Signature Date/Time: 10/16/2022/11:09:36 AM    Final    CT FOREARM LEFT W CONTRAST  Result Date: 10/16/2022 CLINICAL DATA:  Left forearm abscess, check for deep soft tissue infection, seen last night in the ED for the same abnormality and underwent abscess drainage at that time. EXAM: CT OF THE UPPER LEFT EXTREMITY WITH CONTRAST TECHNIQUE: Multidetector CT imaging of the upper left extremity was performed according to the standard protocol following intravenous contrast administration. RADIATION DOSE REDUCTION: This exam was performed according to the departmental dose-optimization program which includes automated exposure control, adjustment of the mA and/or kV  according to patient size and/or use of iterative reconstruction technique. CONTRAST:  44m OMNIPAQUE IOHEXOL 300 MG/ML  SOLN COMPARISON:  None Available. FINDINGS: Bones/Joint/Cartilage There is normal bone mineralization without evidence of fracture, dislocation or primary pathologic bone lesion. Arthritic changes are not seen. No joint effusions are seen. Ligaments Suboptimally assessed by CT. Muscles and Tendons There is moderate superficial edema in the dorsolateral aspect of the mid forearm, extending down to the brachioradialis muscle with loss of definition between the fat and the muscle. Underlying myositis or pyomyositis would be difficult to exclude but no discrete abscess is seen in the muscle itself. Other imaged muscle groups are unremarkable, as visualized. No intramuscular or intermuscular soft tissue gas or focal fluid collections are seen. Area tendons are not well seen with this technique but intact as far as visualized. Soft tissues Skin thickening and underlying edema are noted in the dorsolateral aspect of the mid forearm. There is dense packing material within a small cavity underlying the skin surface in this area, the cavity estimated 1.3 x 1.3 cm AP and transverse and 1.9 cm craniocaudal. This is consistent with a packed abscess cavity. There is scattered nonlocalizing fluid along fat muscle interfaces underlying the edema but no organized fluid collection. Vascular structures demonstrate enhancement. There are calcifications in the radial and ulnar arteries extending into the wrist. IMPRESSION: 1. 1.3  x 1.3 x 1.9 cm abscess cavity in the dorsolateral aspect of the mid forearm, with dense packing material in the cavity. 2. Moderate superficial edema in the dorsolateral aspect of the mid forearm, extending down to the brachioradialis muscle with loss of definition between the fat and the muscle. Underlying myositis or pyomyositis would be difficult to exclude but no discrete abscess is seen  in the muscle itself. 3. No deep soft tissue fluid collections or free air. Absence of findings of a necrotizing infection on CT however, should not delay surgical exploration if there is sufficient clinical concern. 4. Vascular calcifications. Electronically Signed   By: Telford Nab M.D.   On: 10/16/2022 03:39   DG Chest Port 1 View  Result Date: 10/16/2022 CLINICAL DATA:  Questionable for sepsis. EXAM: PORTABLE CHEST 1 VIEW COMPARISON:  Mar 21, 2019 FINDINGS: The heart size and mediastinal contours are within normal limits. Low lung volumes are noted. Both lungs are clear. The visualized skeletal structures are unremarkable. IMPRESSION: No active disease. Electronically Signed   By: Virgina Norfolk M.D.   On: 10/16/2022 01:51    Scheduled Meds:  enoxaparin (LOVENOX) injection  40 mg Subcutaneous Q24H   gabapentin  600 mg Oral BID   insulin aspart  0-15 Units Subcutaneous TID WC   insulin aspart  0-5 Units Subcutaneous QHS   insulin aspart  4 Units Subcutaneous TID WC   insulin detemir  10 Units Subcutaneous BID   lamoTRIgine  200 mg Oral Daily   pantoprazole  40 mg Oral Daily   risperiDONE  1 mg Oral QHS   Continuous Infusions:  dextrose 5% lactated ringers 125 mL/hr at 10/17/22 0910   lactated ringers Stopped (10/17/22 0908)   vancomycin 1,250 mg (10/17/22 0428)     LOS: 1 day   Roxan Hockey M.D on 10/17/2022 at 5:28 PM  Go to [email or web address] - for contact info  [facility] - Office  [phone]  If 7PM-7AM, please contact night-coverage [email or web address] 10/17/2022, 5:28 PM

## 2022-10-17 NOTE — Progress Notes (Signed)
Wound irrigation completed x2 this shift. AM wound irrigation yielded only serous drainage. Pt has had heat pack applied to left arm since [facility] in this afternoon. Wound irrigation completed at this time yielded some thick pus, no bleeding noted. Pt tolerated procedure well both times. Peri-wound area continues to improve as evidenced by less redness, less warmth. K-pad applied per order at this time.

## 2022-10-17 NOTE — Progress Notes (Signed)
Repeat blood sugar 130 mg/dl, pt eating crackers and awaiting breakfast. Denies any c/o.

## 2022-10-18 LAB — CBC
HCT: 31.2 % — ABNORMAL LOW (ref 39.0–52.0)
Hemoglobin: 9.9 g/dL — ABNORMAL LOW (ref 13.0–17.0)
MCH: 27.7 pg (ref 26.0–34.0)
MCHC: 31.7 g/dL (ref 30.0–36.0)
MCV: 87.2 fL (ref 80.0–100.0)
Platelets: 562 10*3/uL — ABNORMAL HIGH (ref 150–400)
RBC: 3.58 MIL/uL — ABNORMAL LOW (ref 4.22–5.81)
RDW: 13.5 % (ref 11.5–15.5)
WBC: 9.9 10*3/uL (ref 4.0–10.5)
nRBC: 0 % (ref 0.0–0.2)

## 2022-10-18 LAB — BASIC METABOLIC PANEL
Anion gap: 6 (ref 5–15)
BUN: 15 mg/dL (ref 6–20)
CO2: 24 mmol/L (ref 22–32)
Calcium: 8.8 mg/dL — ABNORMAL LOW (ref 8.9–10.3)
Chloride: 109 mmol/L (ref 98–111)
Creatinine, Ser: 0.8 mg/dL (ref 0.61–1.24)
GFR, Estimated: >60 mL/min (ref >60)
Glucose, Bld: 63 mg/dL — ABNORMAL LOW (ref 70–99)
Potassium: 3.5 mmol/L (ref 3.5–5.1)
Sodium: 139 mmol/L (ref 135–145)

## 2022-10-18 LAB — GLUCOSE, CAPILLARY
Glucose-Capillary: 63 mg/dL — ABNORMAL LOW (ref 70–99)
Glucose-Capillary: 88 mg/dL (ref 70–99)

## 2022-10-18 MED ORDER — CEPHALEXIN 500 MG PO CAPS: 500.0000 mg | ORAL_CAPSULE | Freq: Three times a day (TID) | ORAL | 0 refills | Status: AC

## 2022-10-18 MED ORDER — ACETAMINOPHEN 325 MG PO TABS: 650.0000 mg | ORAL_TABLET | Freq: Four times a day (QID) | ORAL | 0 refills | Status: DC | PRN

## 2022-10-18 MED ORDER — DOXYCYCLINE HYCLATE 100 MG PO CAPS: 100.0000 mg | ORAL_CAPSULE | Freq: Two times a day (BID) | ORAL | 0 refills | Status: AC

## 2022-10-18 NOTE — Discharge Summary (Signed)
Tyler Mann, is a 41 y.o. male  DOB Aug 30, 1981  MRN [id number].  Admission date:  10/16/2022  Admitting Physician  Barton Dubois, MD  Discharge Date:  10/18/2022   Primary MD  Celene Squibb, MD  Recommendations for primary care physician for things to follow:   1)Keep wound clean and dry----  2)Follow up with Dr. [facility] [address], [place], [place] [address], [phone] 3)Follow up with Celene Squibb, MD -primary care physician for ongoing management of his diabetes 4) complete abstinence from tobacco (smoking cessation) advised  Admission Diagnosis  Cellulitis of left upper extremity [L03.114]   Discharge Diagnosis  Cellulitis of left upper extremity [L03.114]    Principal Problem:   Cellulitis of left upper extremity Active Problems:   Tobacco abuse   Leukocytosis   Polysubstance abuse ([place])   Abscess of left forearm   Uncontrolled type 1 diabetes mellitus with hyperglycemia, with long-term current use of insulin (HCC)   Pseudohyponatremia   GERD (gastroesophageal reflux disease)      Past Medical History:  Diagnosis Date   ADD (attention deficit disorder)    Bipolar disorder ([place])    Chronic back pain    Diabetes mellitus    type 1   Narcotic addiction ([place])    Hx IV heroin abuse   Neuropathy     Past Surgical History:  Procedure Laterality Date   INCISION AND DRAINAGE     MOUTH SURGERY         HPI  from the history and physical done on the day of admission:    HPI: Tyler Mann is a 41 y.o. male with medical history significant of T1DM, diabetic nephropathy, opiate dependence, tobacco abuse, depression, neuropathy, polysubstance abuse who presents to the emergency department due to 1 week of increasing and worsening swelling of face left forearm. Patient was admitted from 11/21 to 11/23 due to acute toxic encephalopathy in  the setting of bipolar disorder, acute kidney injury.  Patient states that prior to being discharged from the hospital, he noted pain in his left forearm and this progressed to an abscess after discharge, this further progressed to swelling, redness of the skin around the wound and the forearm was very warm to touch.  He denies chest pain, shortness of breath, fever, chills, nausea, vomiting.   ED Course:  In the emergency department, respiratory rate was 23/min and BP was 141/73, other vital signs were within normal range.  Workup in the ED showed leukocytosis and normocytic anemia, BMP showed hyponatremia, hypochloremia and blood glucose of 660.  Lactic acid 1.1 > 1.6. CT forearm of left upper extremity showed: 1. 1.3 x 1.3 x 1.9 cm abscess cavity in the dorsolateral aspect of the mid forearm, with dense packing material in the cavity. 2. Moderate superficial edema in the dorsolateral aspect of the mid forearm, extending down to the brachioradialis muscle with loss of definition between the fat and the muscle. Underlying myositis or pyomyositis would be difficult to exclude but  no discrete abscess is seen in the muscle itself. 3. No deep soft tissue fluid collections or free air. Absence of findings of a necrotizing infection on CT however, should not delay surgical exploration if there is sufficient clinical concern. 4. Vascular calcifications. Chest x-ray showed no active disease. Patient was treated with IV vancomycin.  IV hydration was provided.  Hospitalist was asked to admit patient for further evaluation and management.   Review of Systems: Review of systems as noted in the HPI. All other systems reviewed and are negative.    Hospital Course:   Brief Narrative:  41 y.o. male with medical history significant of T1DM, diabetic nephropathy, opiate dependence, tobacco abuse, depression, neuropathy, polysubstance abuse admitted with Lt arm cellulitis/abscess on 10/16/22     -Assessment and  Plan: 1)MRSA Cellulitis and Abscess of Lt Forearm  -Failed outpatient treatment after being previously seen in the ED for I&D -Initially treated with IV Rocephin, transitioned to IV vancomycin -Elevated CRP and ESR noted -WBC improving -Blood cultures NGTD  -Aerobic wound culture with MRSA -TTE without endocarditis preserved EF noted no significant valvular normalities -No need for TEE given negative blood cultures. -Okay to discharge on Keflex and doxycycline   2)DM1- A1c 10.4 reflecting uncontrolled diabetes with hyperglycemia PTA -erratic blood sugars with hypo and hyperglycemia -Diabetic educator input appreciated -Insulin regimen adjusted -Outpatient follow-up with PCP advised   3)History of polysubstance abuse including methamphetamine and tobacco abuse Patient denies current use of any substance abuse at this time He was counseled on cessation of tobacco abuse   4)GERD Continue Protonix    Disposition: The patient is from: Home              Anticipated d/c is to: Home   Discharge Condition: stable Follow UP   Follow-up Information     Mordecai Rasmussen, MD. Call in 1 week(s).   Specialties: Orthopedic Surgery, Sports Medicine Contact information: [place]. [address] [place] [address] [phone]                 Consults obtained - ortho  Diet and Activity recommendation:  As advised  Discharge Instructions    Discharge Instructions     Call MD for:  difficulty breathing, headache or visual disturbances   Complete by: As directed    Call MD for:  persistant dizziness or light-headedness   Complete by: As directed    Call MD for:  persistant nausea and vomiting   Complete by: As directed    Call MD for:  severe uncontrolled pain   Complete by: As directed    Call MD for:  temperature >100.4   Complete by: As directed    Diet - low sodium heart healthy   Complete by: As directed    Diet Carb Modified   Complete by: As directed    Discharge  instructions   Complete by: As directed    1)Keep wound clean and dry----  2)Follow up with [facility] [address] [place], [place] [address], [phone] 3)Follow up with Celene Squibb, MD -primary care physician for ongoing management of his diabetes 4) complete abstinence from tobacco (smoking cessation) advised   Discharge wound care:   Complete by: As directed    As advised   Increase activity slowly   Complete by: As directed          Discharge Medications     Allergies as of 10/18/2022       Reactions  Trazodone And Nefazodone Other (See Comments)   Hallucinations and nightmares        Medication List     STOP taking these medications    ibuprofen 800 MG tablet Commonly known as: ADVIL       TAKE these medications    acetaminophen 325 MG tablet Commonly known as: TYLENOL Take 2 tablets (650 mg total) by mouth every 6 (six) hours as needed for mild pain, fever or headache (or Fever >/= 101).   cephALEXin 500 MG capsule Commonly known as: Keflex Take 1 capsule (500 mg total) by mouth 3 (three) times daily for 5 days.   cyclobenzaprine 10 MG tablet Commonly known as: FLEXERIL Take 10 mg by mouth 2 (two) times daily.   doxycycline 100 MG capsule Commonly known as: VIBRAMYCIN Take 1 capsule (100 mg total) by mouth 2 (two) times daily for 10 days.   gabapentin 600 MG tablet Commonly known as: NEURONTIN Take 600 mg by mouth 2 (two) times daily.   insulin detemir 100 UNIT/ML injection Commonly known as: LEVEMIR Inject 0.14 mLs (14 Units total) into the skin at bedtime. For diabetes management What changed: how much to take   lamoTRIgine 200 MG tablet Commonly known as: LAMICTAL Take 1 tablet by mouth daily.   NovoLOG 100 UNIT/ML injection Generic drug: insulin aspart Inject 5-20 Units into the skin 3 (three) times daily.   pantoprazole 40 MG tablet Commonly known as: PROTONIX Take 1 tablet (40 mg  total) by mouth daily.   risperiDONE 1 MG tablet Commonly known as: RisperDAL Take 1 tablet (1 mg total) by mouth at bedtime.               Discharge Care Instructions  (From admission, onward)           Start     Ordered   10/18/22 0000  Discharge wound care:       Comments: As advised   10/18/22 1027            Major procedures and Radiology Reports - PLEASE review detailed and final reports for all details, in brief -   ECHOCARDIOGRAM COMPLETE  Result Date: 10/16/2022    ECHOCARDIOGRAM REPORT   Patient Name:   EVERTTE SONES [facility] Date of Exam: 10/16/2022 Medical Rec #:  [id number]       Height:       72.0 in Accession #:    [id number]      Weight:       180.7 lb Date of Birth:  [birth year]      BSA:          2.041 m Patient Age:    69 years        BP:           158/86 mmHg Patient Gender: M               HR:           82 bpm. Exam Location:  [facility] Procedure: 2D Echo, Cardiac Doppler and Color Doppler Indications:    Endocarditis I38  History:        Patient has prior history of Echocardiogram examinations, most                 recent 06/20/2010. Risk Factors:Diabetes, Current Smoker and                 Dyslipidemia. This admit for Abscess of left forearm,  Polysubstance abuse ([place]), Bipolar disorder ([place]) (From Hx).  Sonographer:    Alvino Chapel RCS Referring Phys: [id number] OLADAPO ADEFESO IMPRESSIONS  1. Left ventricular ejection fraction, by estimation, is 60 to 65%. The left ventricle has normal function. The left ventricle has no regional wall motion abnormalities. Left ventricular diastolic parameters were normal.  2. Right ventricular systolic function is normal. The right ventricular size is mildly enlarged. There is normal pulmonary artery systolic pressure.  3. Right atrial size was mildly dilated.  4. The mitral valve is grossly normal. Trivial mitral valve regurgitation. No evidence of mitral stenosis.  5. The aortic valve is grossly normal. Aortic  valve regurgitation is not visualized. No aortic stenosis is present.  6. The inferior vena cava is normal in size with greater than 50% respiratory variability, suggesting right atrial pressure of 3 mmHg. Conclusion(s)/Recommendation(s): No evidence of valvular vegetations on this transthoracic echocardiogram. Consider a transesophageal echocardiogram to exclude infective endocarditis if clinically indicated. FINDINGS  Left Ventricle: Left ventricular ejection fraction, by estimation, is 60 to 65%. The left ventricle has normal function. The left ventricle has no regional wall motion abnormalities. The left ventricular internal cavity size was normal in size. There is  no left ventricular hypertrophy. Left ventricular diastolic parameters were normal. Right Ventricle: The right ventricular size is mildly enlarged. No increase in right ventricular wall thickness. Right ventricular systolic function is normal. There is normal pulmonary artery systolic pressure. The tricuspid regurgitant velocity is 2.86  m/s, and with an assumed right atrial pressure of 3 mmHg, the estimated right ventricular systolic pressure is 22.2 mmHg. Left Atrium: Left atrial size was normal in size. Right Atrium: Right atrial size was mildly dilated. Pericardium: There is no evidence of pericardial effusion. Mitral Valve: The mitral valve is grossly normal. Trivial mitral valve regurgitation. No evidence of mitral valve stenosis. Tricuspid Valve: The tricuspid valve is grossly normal. Tricuspid valve regurgitation is not demonstrated. No evidence of tricuspid stenosis. Aortic Valve: The aortic valve is grossly normal. Aortic valve regurgitation is not visualized. No aortic stenosis is present. Pulmonic Valve: The pulmonic valve was grossly normal. Pulmonic valve regurgitation is trivial. No evidence of pulmonic stenosis. Aorta: The aortic root is normal in size and structure. Venous: The inferior vena cava is normal in size with greater than  50% respiratory variability, suggesting right atrial pressure of 3 mmHg. IAS/Shunts: No atrial level shunt detected by color flow Doppler.  LEFT VENTRICLE PLAX 2D LVIDd:         4.70 cm   Diastology LVIDs:         3.00 cm   LV e' medial:    10.00 cm/s LV PW:         1.10 cm   LV E/e' medial:  11.5 LV IVS:        1.00 cm   LV e' lateral:   12.20 cm/s LVOT diam:     1.80 cm   LV E/e' lateral: 9.4 LV SV:         75 LV SV Index:   37 LVOT Area:     2.54 cm  RIGHT VENTRICLE RV S prime:     16.50 cm/s TAPSE (M-mode): 2.1 cm LEFT ATRIUM             Index        RIGHT ATRIUM           Index LA diam:        3.80 cm 1.86 cm/m  RA Area:     19.60 cm LA Vol (A2C):   64.4 ml 31.56 ml/m  RA Volume:   61.00 ml  29.89 ml/m LA Vol (A4C):   53.2 ml 26.07 ml/m LA Biplane Vol: 59.6 ml 29.21 ml/m  AORTIC VALVE LVOT Vmax:   123.00 cm/s LVOT Vmean:  86.800 cm/s LVOT VTI:    0.295 m  AORTA Ao Root diam: 3.00 cm MITRAL VALVE                TRICUSPID VALVE MV Area (PHT): 3.27 cm     TR Peak grad:   32.7 mmHg MV Decel Time: 232 msec     TR Vmax:        286.00 cm/s MV E velocity: 115.00 cm/s MV A velocity: 91.70 cm/s   SHUNTS MV E/A ratio:  1.25         Systemic VTI:  0.30 m                             Systemic Diam: 1.80 cm Vishnu Priya Mallipeddi Electronically signed by Lorelee Cover Mallipeddi Signature Date/Time: 10/16/2022/11:09:36 AM    Final    CT FOREARM LEFT W CONTRAST  Result Date: 10/16/2022 CLINICAL DATA:  Left forearm abscess, check for deep soft tissue infection, seen last night in the ED for the same abnormality and underwent abscess drainage at that time. EXAM: CT OF THE UPPER LEFT EXTREMITY WITH CONTRAST TECHNIQUE: Multidetector CT imaging of the upper left extremity was performed according to the standard protocol following intravenous contrast administration. RADIATION DOSE REDUCTION: This exam was performed according to the departmental dose-optimization program which includes automated exposure control,  adjustment of the mA and/or kV according to patient size and/or use of iterative reconstruction technique. CONTRAST:  95m OMNIPAQUE IOHEXOL 300 MG/ML  SOLN COMPARISON:  None Available. FINDINGS: Bones/Joint/Cartilage There is normal bone mineralization without evidence of fracture, dislocation or primary pathologic bone lesion. Arthritic changes are not seen. No joint effusions are seen. Ligaments Suboptimally assessed by CT. Muscles and Tendons There is moderate superficial edema in the dorsolateral aspect of the mid forearm, extending down to the brachioradialis muscle with loss of definition between the fat and the muscle. Underlying myositis or pyomyositis would be difficult to exclude but no discrete abscess is seen in the muscle itself. Other imaged muscle groups are unremarkable, as visualized. No intramuscular or intermuscular soft tissue gas or focal fluid collections are seen. Area tendons are not well seen with this technique but intact as far as visualized. Soft tissues Skin thickening and underlying edema are noted in the dorsolateral aspect of the mid forearm. There is dense packing material within a small cavity underlying the skin surface in this area, the cavity estimated 1.3 x 1.3 cm AP and transverse and 1.9 cm craniocaudal. This is consistent with a packed abscess cavity. There is scattered nonlocalizing fluid along fat muscle interfaces underlying the edema but no organized fluid collection. Vascular structures demonstrate enhancement. There are calcifications in the radial and ulnar arteries extending into the wrist. IMPRESSION: 1. 1.3 x 1.3 x 1.9 cm abscess cavity in the dorsolateral aspect of the mid forearm, with dense packing material in the cavity. 2. Moderate superficial edema in the dorsolateral aspect of the mid forearm, extending down to the brachioradialis muscle with loss of definition between the fat and the muscle. Underlying myositis or pyomyositis would be difficult to exclude  but no discrete abscess is  seen in the muscle itself. 3. No deep soft tissue fluid collections or free air. Absence of findings of a necrotizing infection on CT however, should not delay surgical exploration if there is sufficient clinical concern. 4. Vascular calcifications. Electronically Signed   By: Telford Nab M.D.   On: 10/16/2022 03:39   DG Chest Port 1 View  Result Date: 10/16/2022 CLINICAL DATA:  Questionable for sepsis. EXAM: PORTABLE CHEST 1 VIEW COMPARISON:  Mar 21, 2019 FINDINGS: The heart size and mediastinal contours are within normal limits. Low lung volumes are noted. Both lungs are clear. The visualized skeletal structures are unremarkable. IMPRESSION: No active disease. Electronically Signed   By: Virgina Norfolk M.D.   On: 10/16/2022 01:51    Micro Results   Recent Results (from the past 240 hour(s))  Aerobic Culture w Gram Stain (superficial specimen)     Status: None   Collection Time: 10/14/22  8:15 PM   Specimen: Abscess  Result Value Ref Range Status   Specimen Description   Final    ABSCESS Performed at [facility], [address], [place], [place] [address]    Special Requests   Final    NONE Performed at [facility], [address], [place], [place] [address]    Gram Stain   Final    MODERATE WBC PRESENT, PREDOMINANTLY PMN FEW GRAM POSITIVE COCCI IN CLUSTERS Performed at [facility], [place] [address], [place], [place] [address]    Culture   Final    ABUNDANT METHICILLIN RESISTANT STAPHYLOCOCCUS AUREUS   Report Status 10/17/2022 FINAL  Final   Organism ID, Bacteria METHICILLIN RESISTANT STAPHYLOCOCCUS AUREUS  Final      Susceptibility   Methicillin resistant staphylococcus aureus - MIC*    CIPROFLOXACIN >=8 RESISTANT Resistant     ERYTHROMYCIN >=8 RESISTANT Resistant     GENTAMICIN <=0.5 SENSITIVE Sensitive     OXACILLIN >=4 RESISTANT Resistant     TETRACYCLINE <=1 SENSITIVE Sensitive     VANCOMYCIN <=0.5 SENSITIVE Sensitive     TRIMETH/SULFA  >=320 RESISTANT Resistant     CLINDAMYCIN <=0.25 SENSITIVE Sensitive     RIFAMPIN <=0.5 SENSITIVE Sensitive     Inducible Clindamycin NEGATIVE Sensitive     * ABUNDANT METHICILLIN RESISTANT STAPHYLOCOCCUS AUREUS  Blood Culture (routine x 2)     Status: None (Preliminary result)   Collection Time: 10/16/22  1:37 AM   Specimen: BLOOD RIGHT FOREARM  Result Value Ref Range Status   Specimen Description BLOOD RIGHT FOREARM  Final   Special Requests   Final    BOTTLES DRAWN AEROBIC AND ANAEROBIC Blood Culture adequate volume   Culture   Final    NO GROWTH 1 DAY Performed at [facility], [address] [place], [place] [address]    Report Status PENDING  Incomplete  Blood Culture (routine x 2)     Status: None (Preliminary result)   Collection Time: 10/16/22  2:25 AM   Specimen: BLOOD  Result Value Ref Range Status   Specimen Description BLOOD RIGHT ANTECUBITAL  Final   Special Requests   Final    BOTTLES DRAWN AEROBIC AND ANAEROBIC Blood Culture results may not be optimal due to an excessive volume of blood received in culture bottles   Culture   Final    NO GROWTH 1 DAY Performed at [facility], [address], [place],  [address]    Report Status PENDING  Incomplete    Today   Subjective    Melvenia Needles  today has no new complaints  No fever  Or chills   No Nausea, Vomiting or Diarrhea          Patient has been seen and examined prior to discharge   Objective   Blood pressure (!) 141/81, pulse 87, temperature 97.7 F (36.5 C), resp. rate 20, height 6' (1.829 m), weight 82 kg, SpO2 99 %.   Intake/Output Summary (Last 24 hours) at 10/18/2022 1028 Last data filed at 10/18/2022 0300 Gross per 24 hour  Intake 1220 ml  Output 4 ml  Net 1216 ml    Exam Gen:- Awake Alert,  in no apparent distress  HEENT:- [place].AT, No sclera icterus Neck-Supple Neck,No JVD,.  Lungs-  CTAB , fair symmetrical air movement CV- S1, S2 normal, regular  Abd-  +ve B.Sounds, Abd Soft,  No tenderness,    Extremity/Skin:- No  edema, pedal pulses present  Psych-affect is appropriate, oriented x3 Neuro-no new focal deficits, no tremors MSK-left forearm open wound with much improved area of induration, erythema , overall much improved swelling -no significant drainage at this time no significant tenderness at this time   Data Review   CBC w Diff:  Lab Results  Component Value Date   WBC 9.9 10/18/2022   HGB 9.9 (L) 10/18/2022   HCT 31.2 (L) 10/18/2022   PLT 562 (H) 10/18/2022   LYMPHOPCT 15 10/16/2022   MONOPCT 7 10/16/2022   EOSPCT 5 10/16/2022   BASOPCT 1 10/16/2022    CMP:  Lab Results  Component Value Date   NA 139 10/18/2022   K 3.5 10/18/2022   CL 109 10/18/2022   CO2 24 10/18/2022   BUN 15 10/18/2022   CREATININE 0.80 10/18/2022   PROT 7.2 10/17/2022   ALBUMIN 2.8 (L) 10/17/2022   BILITOT 0.3 10/17/2022   ALKPHOS 86 10/17/2022   AST 12 (L) 10/17/2022   ALT 15 10/17/2022  .  Total Discharge time is about 33 minutes  Roxan Hockey M.D on 10/18/2022 at 10:28 AM  Go to [email or web address] -  for contact info  [facility] - Office  [phone]

## 2022-10-18 NOTE — Progress Notes (Signed)
Nsg Discharge Note  Admit Date:  10/16/2022 Discharge date: 10/18/2022   Tyler Mann to be D/C'd Home per MD order.  AVS completed.  Copy for chart, and copy for patient signed, and dated. Patient/caregiver able to verbalize understanding.  Discharge Medication: Allergies as of 10/18/2022       Reactions   Trazodone And Nefazodone Other (See Comments)   Hallucinations and nightmares        Medication List     STOP taking these medications    ibuprofen 800 MG tablet Commonly known as: ADVIL       TAKE these medications    acetaminophen 325 MG tablet Commonly known as: TYLENOL Take 2 tablets (650 mg total) by mouth every 6 (six) hours as needed for mild pain, fever or headache (or Fever >/= 101).   cephALEXin 500 MG capsule Commonly known as: Keflex Take 1 capsule (500 mg total) by mouth 3 (three) times daily for 5 days.   cyclobenzaprine 10 MG tablet Commonly known as: FLEXERIL Take 10 mg by mouth 2 (two) times daily.   doxycycline 100 MG capsule Commonly known as: VIBRAMYCIN Take 1 capsule (100 mg total) by mouth 2 (two) times daily for 10 days.   gabapentin 600 MG tablet Commonly known as: NEURONTIN Take 600 mg by mouth 2 (two) times daily.   insulin detemir 100 UNIT/ML injection Commonly known as: LEVEMIR Inject 0.14 mLs (14 Units total) into the skin at bedtime. For diabetes management What changed: how much to take   lamoTRIgine 200 MG tablet Commonly known as: LAMICTAL Take 1 tablet by mouth daily.   NovoLOG 100 UNIT/ML injection Generic drug: insulin aspart Inject 5-20 Units into the skin 3 (three) times daily.   pantoprazole 40 MG tablet Commonly known as: PROTONIX Take 1 tablet (40 mg total) by mouth daily.   risperiDONE 1 MG tablet Commonly known as: RisperDAL Take 1 tablet (1 mg total) by mouth at bedtime.               Discharge Care Instructions  (From admission, onward)           Start     Ordered   10/18/22 0000   Discharge wound care:       Comments: As advised   10/18/22 1027            Discharge Assessment: Vitals:   10/17/22 2025 10/18/22 0408  BP: (!) 148/75 (!) 141/81  Pulse: 85 87  Resp: 20 20  Temp: 98.2 F (36.8 C) 97.7 F (36.5 C)  SpO2: 100% 99%   Skin clean, dry and intact without evidence of skin break down, no evidence of skin tears noted. IV catheter discontinued intact. Site without signs and symptoms of complications - no redness or edema noted at insertion site, patient denies c/o pain - only slight tenderness at site.  Dressing with slight pressure applied.  D/c Instructions-Education: Discharge instructions given to patient/family with verbalized understanding. D/c education completed with patient/family including follow up instructions, medication list, d/c activities limitations if indicated, with other d/c instructions as indicated by MD - patient able to verbalize understanding, all questions fully answered. Patient instructed to return to ED, call 911, or call MD for any changes in condition.  Patient escorted via WC, and D/C home via private auto.  Demetrio Lapping, LPN [id number]/[id number] 40:81 AM

## 2022-10-18 NOTE — Progress Notes (Signed)
Irrigation performed to left forearm Q4h this shift. Patient tolerated well. No drainage noted. Patient denies any pain or discomfort. Kpad applied per order.

## 2022-10-21 LAB — CULTURE, BLOOD (ROUTINE X 2)
Culture: NO GROWTH
Culture: NO GROWTH
Special Requests: ADEQUATE

## 2022-12-06 ENCOUNTER — Ambulatory Visit (HOSPITAL_COMMUNITY): Payer: No Typology Code available for payment source | Attending: Physical Therapy | Admitting: Physical Therapy

## 2022-12-20 ENCOUNTER — Other Ambulatory Visit: Payer: Self-pay

## 2022-12-20 ENCOUNTER — Ambulatory Visit (HOSPITAL_COMMUNITY): Payer: 59 | Attending: Family Medicine

## 2022-12-20 DIAGNOSIS — M79604 Pain in right leg: Secondary | ICD-10-CM | POA: Insufficient documentation

## 2022-12-20 DIAGNOSIS — M5459 Other low back pain: Secondary | ICD-10-CM | POA: Insufficient documentation

## 2022-12-20 DIAGNOSIS — M79605 Pain in left leg: Secondary | ICD-10-CM | POA: Insufficient documentation

## 2022-12-20 NOTE — Therapy (Signed)
OUTPATIENT PHYSICAL THERAPY THORACOLUMBAR EVALUATION   Patient Name: Tyler Mann MRN: [id number] DOB:[birth year], 42 y.o., male Today's Date: 12/20/2022  END OF SESSION:  PT End of Session - 12/20/22 1220     Visit Number 1    Number of Visits 8    Date for PT Re-Evaluation 01/17/23    PT Start Time 6468    PT Stop Time 1115    PT Time Calculation (min) 30 min    Activity Tolerance Patient tolerated treatment well    Behavior During Therapy Restless   Patient appears to be restless. Undecided in the beginning if he should end the session early or not because he has to go to work.            Past Medical History:  Diagnosis Date   ADD (attention deficit disorder)    Bipolar disorder ([employer])    Chronic back pain    Diabetes mellitus    type 1   Narcotic addiction ([place])    Hx IV heroin abuse   Neuropathy    Past Surgical History:  Procedure Laterality Date   INCISION AND DRAINAGE     MOUTH SURGERY     Patient Active Problem List   Diagnosis Date Noted   Cellulitis of left upper extremity 10/16/2022   Abscess of left forearm 10/16/2022   Uncontrolled type 1 diabetes mellitus with hyperglycemia, with long-term current use of insulin ([place]) 10/16/2022   Pseudohyponatremia 10/16/2022   GERD (gastroesophageal reflux disease) 10/16/2022   AKI (acute kidney injury) ([place]) 10/09/2022   Opioid dependence with intoxication with complication ([place])    Medication overdose    Major depressive disorder, recurrent, severe without psychotic features ([place])    MDD (major depressive disorder), recurrent episode, severe ([place]) 12/30/2014   Drug overdose, intentional ([place]) 12/26/2014   Hypoglycemia 12/26/2014   Overdose of benzodiazepine    Overdose of insulin    Substance induced mood disorder ([place]) 12/08/2014   Hyperglycemia without ketosis    Diabetes type 1, uncontrolled    Suicidal ideation 12/05/2014    Class: Acute   Hyperglycemia 12/03/2014   Polysubstance abuse ([place])  12/03/2014   Hypokalemia 11/03/2014   Leukocytosis 11/02/2014   Hyperkalemia 11/02/2014   Chronic back pain 11/02/2014   Hematemesis 11/02/2014   Generalized anxiety disorder 11/02/2014   DKA (diabetic ketoacidoses) 11/19/2013   DKA, type 1 ([place]) 11/22/2012   Opiate dependence ([place]) 11/22/2012   Tobacco abuse 11/22/2012   Diabetic nephropathy ([place]) 11/22/2012   Diabetic ketoacidosis ([place]) 09/20/2012   Diabetes mellitus type 1 ([place]) 09/20/2012   Chronic pain 09/20/2012   Hyperlipidemia 09/20/2012    PCP: Celene Squibb  REFERRING PROVIDER: Ezequiel Essex, FNP  REFERRING DIAG: R25.2 (ICD-10-CM) - Cramp and spasm  Rationale for Evaluation and Treatment: Rehabilitation  THERAPY DIAG:  Other low back pain  Pain in both lower extremities  ONSET DATE: 4-5 months ago  SUBJECTIVE:  SUBJECTIVE STATEMENT: Patient is 15 minutes late for today's evaluation. Denies pain at the moment. Complaints of spasms on the low back and the Les on occasion. Condition started around 4-5 months ago without apparent reason. However, patient states that it could be because he slept in the couch. Patient even went to the ER at one point because his back was so bad. Patient then went to his PCP and was given Flexeril which is helping. No imaging done on the back. Patient was also referred to outpatient PT evaluation and management.  PERTINENT HISTORY:  Diabetes, ADD, Bipolar  PAIN:  Are you having pain? Yes: NPRS scale: 2/10 Pain location: lower back, B LE Pain description: tightness Aggravating factors: "move the wrong way" Relieving factors: "when I start moving"  PRECAUTIONS: None  WEIGHT BEARING RESTRICTIONS: No  FALLS:  Has patient fallen in last 6 months? No  OCCUPATION: Contractor  PLOF:  Independent  PATIENT GOALS: "for the back to feel better"  NEXT MD VISIT: no appointment set this time  OBJECTIVE:   DIAGNOSTIC FINDINGS:  None performed pertaining to the patient's problem  PATIENT SURVEYS:  Modified Oswestry 14%   SCREENING FOR RED FLAGS: Bowel or bladder incontinence: No Spinal tumors: No Cauda equina syndrome: No  COGNITION: Overall cognitive status: Within functional limits for tasks assessed     SENSATION: Not tested  MUSCLE LENGTH: Moderate tightness on B hamstrings, piriformis, and hip flexors  LUMBAR ROM:   AROM eval  Flexion 100%  Extension 50%  Right lateral flexion   Left lateral flexion   Right rotation 75%  Left rotation 75%   (Blank rows = not tested) Repeated flex/ext did not affect the pain LOWER EXTREMITY ROM:     Active  Right eval Left eval  Hip flexion [facility]  Hip extension    Hip abduction [facility]  Hip adduction    Hip internal rotation    Hip external rotation    Knee flexion [facility]  Knee extension [facility]  Ankle dorsiflexion [facility] WFL  Ankle plantarflexion [facility] WFL  Ankle inversion    Ankle eversion     (Blank rows = not tested)  LOWER EXTREMITY MMT:    MMT Right eval Left eval  Hip flexion 4+ 4+  Hip extension 4 4  Hip abduction 4 4  Hip adduction    Hip internal rotation    Hip external rotation    Knee flexion 5 5  Knee extension 5 5  Ankle dorsiflexion 5 5  Ankle plantarflexion 5 5  Ankle inversion    Ankle eversion     (Blank rows = not tested) Trunk flex MMT = 3/5  LUMBAR SPECIAL TESTS:  Straight leg raise test: Negative and Thomas test: Negative  GAIT: Distance walked: 50 Assistive device utilized: None Level of assistance: Complete Independence Comments: no gait deviations observed  TODAY'S TREATMENT:  DATE:  12/20/22 Evaluation and patient education (written  HEP) Supine: Single knee-to-chest x 15" x 5 on each Seated: Hamstring stretch x 30" x 3 Piriformis stretch x 30" x 3   PATIENT EDUCATION:  Education details: Educated on the pathoanatomy of low back pain. Educated on the goals and course of rehab. Written HEP provided and reviewed  Person educated: Patient Education method: Explanation, Demonstration, and Handouts Education comprehension: verbalized understanding and returned demonstration  HOME EXERCISE PROGRAM: Access Code: [email or web address] URL: [email or web address] Date: 12/20/2022 Prepared by: Rexene Alberts  Exercises - Hooklying Single Knee to Chest Stretch  - 1-2 x daily - 5-7 x weekly - 5 reps - 15 hold - Seated Hamstring Stretch  - 1-2 x daily - 5-7 x weekly - 3 reps - 30 hold - Seated Piriformis Stretch  - 1-2 x daily - 5-7 x weekly - 3 reps - 30 hold  ASSESSMENT:  CLINICAL IMPRESSION: Patient is a 42 y.o. male who was seen today for physical therapy evaluation and treatment for cramps and spasms on the low back. Patient was diagnosed with cramps and spasms by referring provider further defined by difficulty with bending and ADLs, such as toileting due to pain, weakness, and decreased soft tissue extensibility. Skilled PT is required to address the impairments and functional limitations listed below. Tolerated all activities without worsening of symptoms. Required slight amount of cueing to ensure correct execution of activity.  OBJECTIVE IMPAIRMENTS: decreased ROM, decreased strength, impaired flexibility, and pain.   ACTIVITY LIMITATIONS: toileting  PARTICIPATION LIMITATIONS:  work  PERSONAL FACTORS: Behavior pattern are also affecting patient's functional outcome.   REHAB POTENTIAL: Good  CLINICAL DECISION MAKING: Stable/uncomplicated  EVALUATION COMPLEXITY: Low   GOALS: Goals reviewed with patient? Yes  SHORT TERM GOALS: Target date: 01/03/23  Pt will demonstrate indep in HEP to facilitate  carry-over of skilled services and improve functional outcomes Goal status: INITIAL  2.  Pt will demonstrate increase in lumbar ext ROM by 25%  to facilitate ease in ADLs Baseline: 50% Goal status: INITIAL  LONG TERM GOALS: Target date: 01/17/23  Pt will increase modified ODI by at least 12.8% in order to demonstrate clinically significant improvement in ADLs Baseline: 14% Goal status: INITIAL  2.  Pt will report decrease in pain to 1/10 to facilitate ease in ADLs  Baseline: 2/10 Goal status: INITIAL  PLAN:  PT FREQUENCY: 1-2x/week  PT DURATION: 4 weeks  PLANNED INTERVENTIONS: Therapeutic exercises, Therapeutic activity, Neuromuscular re-education, Patient/Family education, Self Care, Joint mobilization, Cryotherapy, Moist heat, and Manual therapy.  PLAN FOR NEXT SESSION: May continue withy improving LE flexibility, core and hip strength  Chrissie Noa L. Jaxten Brosh, PT, DPT, OCS Board-Certified Clinical Specialist in [place] # ([place]): [id number] T 12/20/2022, 12:32 PM

## 2022-12-24 ENCOUNTER — Encounter (HOSPITAL_COMMUNITY): Payer: 59 | Admitting: Physical Therapy

## 2023-01-02 ENCOUNTER — Encounter (HOSPITAL_COMMUNITY): Payer: 59

## 2023-01-02 ENCOUNTER — Telehealth (HOSPITAL_COMMUNITY): Payer: Self-pay

## 2023-01-02 NOTE — Telephone Encounter (Signed)
Called patient today to follow up with his appointment but patient did not pick up. Left a voice message reminding him of the no show policy his future appointments.

## 2023-01-09 ENCOUNTER — Encounter (HOSPITAL_COMMUNITY): Payer: 59 | Admitting: Physical Therapy

## 2023-01-11 ENCOUNTER — Telehealth (HOSPITAL_COMMUNITY): Payer: Self-pay

## 2023-01-11 ENCOUNTER — Encounter (HOSPITAL_COMMUNITY): Payer: 59

## 2023-01-11 NOTE — Telephone Encounter (Signed)
Called patient today to follow-up with his appointment as today is his 2nd no show. PT attempted to leave a message but patient's mailbox is full.

## 2023-01-15 ENCOUNTER — Encounter (HOSPITAL_COMMUNITY): Payer: 59

## 2023-01-15 ENCOUNTER — Telehealth (HOSPITAL_COMMUNITY): Payer: Self-pay

## 2023-01-15 ENCOUNTER — Encounter (HOSPITAL_COMMUNITY): Payer: Self-pay

## 2023-01-15 NOTE — Therapy (Signed)
PHYSICAL THERAPY DISCHARGE SUMMARY  Visits from Start of Care: 1  Current functional level related to goals / functional outcomes: NA   Remaining deficits: NA   Education / Equipment: NA   Patient agrees to discharge. Patient goals were not met. Patient is being discharged due to not returning since the last visit.   

## 2023-01-15 NOTE — Telephone Encounter (Signed)
Called patient today to follow up with his appointment as today is his 3rd no show. Patient's voice mailbox is full so PT cannot leave a message. Patient will be D/C from PT per facility's policy.

## 2023-01-16 DIAGNOSIS — F909 Attention-deficit hyperactivity disorder, unspecified type: Secondary | ICD-10-CM | POA: Diagnosis not present

## 2023-01-16 DIAGNOSIS — F3181 Bipolar II disorder: Secondary | ICD-10-CM | POA: Diagnosis not present

## 2023-01-21 DIAGNOSIS — E782 Mixed hyperlipidemia: Secondary | ICD-10-CM | POA: Diagnosis not present

## 2023-01-21 DIAGNOSIS — E109 Type 1 diabetes mellitus without complications: Secondary | ICD-10-CM | POA: Diagnosis not present

## 2023-01-21 DIAGNOSIS — N182 Chronic kidney disease, stage 2 (mild): Secondary | ICD-10-CM | POA: Diagnosis not present

## 2023-02-08 ENCOUNTER — Encounter (HOSPITAL_COMMUNITY): Payer: Self-pay

## 2023-02-08 NOTE — Therapy (Signed)
PHYSICAL THERAPY DISCHARGE SUMMARY  Visits from Start of Care: 1  Current functional level related to goals / functional outcomes: Not assessed as patient did not return for his future appointments   Remaining deficits: Not assessed as patient did not return for his future appointments   Education / Equipment: N/A   Patient agrees to discharge. Patient goals were Not assessed as patient did not return for his future appointments. Patient is being discharged due to not returning since the last visit.  Harvie Heck. Shep Porter, PT, DPT, OCS Board-Certified Clinical Specialist in [place] # ([place]): [phone] T

## 2023-02-27 DIAGNOSIS — M62830 Muscle spasm of back: Secondary | ICD-10-CM | POA: Diagnosis not present

## 2023-02-27 DIAGNOSIS — F319 Bipolar disorder, unspecified: Secondary | ICD-10-CM | POA: Diagnosis not present

## 2023-02-27 DIAGNOSIS — G8929 Other chronic pain: Secondary | ICD-10-CM | POA: Diagnosis not present

## 2023-02-27 DIAGNOSIS — J309 Allergic rhinitis, unspecified: Secondary | ICD-10-CM | POA: Diagnosis not present

## 2023-02-27 DIAGNOSIS — D649 Anemia, unspecified: Secondary | ICD-10-CM | POA: Diagnosis not present

## 2023-02-27 DIAGNOSIS — R1310 Dysphagia, unspecified: Secondary | ICD-10-CM | POA: Diagnosis not present

## 2023-02-27 DIAGNOSIS — Z794 Long term (current) use of insulin: Secondary | ICD-10-CM | POA: Diagnosis not present

## 2023-02-27 DIAGNOSIS — M545 Low back pain, unspecified: Secondary | ICD-10-CM | POA: Diagnosis not present

## 2023-02-27 DIAGNOSIS — E1040 Type 1 diabetes mellitus with diabetic neuropathy, unspecified: Secondary | ICD-10-CM | POA: Diagnosis not present

## 2023-02-27 DIAGNOSIS — R7989 Other specified abnormal findings of blood chemistry: Secondary | ICD-10-CM | POA: Diagnosis not present

## 2023-02-27 DIAGNOSIS — F17200 Nicotine dependence, unspecified, uncomplicated: Secondary | ICD-10-CM | POA: Diagnosis not present

## 2023-02-27 DIAGNOSIS — R809 Proteinuria, unspecified: Secondary | ICD-10-CM | POA: Diagnosis not present

## 2023-05-14 DIAGNOSIS — E103293 Type 1 diabetes mellitus with mild nonproliferative diabetic retinopathy without macular edema, bilateral: Secondary | ICD-10-CM | POA: Diagnosis not present

## 2023-05-15 DIAGNOSIS — F909 Attention-deficit hyperactivity disorder, unspecified type: Secondary | ICD-10-CM | POA: Diagnosis not present

## 2023-05-15 DIAGNOSIS — F3181 Bipolar II disorder: Secondary | ICD-10-CM | POA: Diagnosis not present

## 2023-06-04 DIAGNOSIS — F319 Bipolar disorder, unspecified: Secondary | ICD-10-CM | POA: Diagnosis not present

## 2023-06-04 DIAGNOSIS — Z794 Long term (current) use of insulin: Secondary | ICD-10-CM | POA: Diagnosis not present

## 2023-06-10 DIAGNOSIS — E1065 Type 1 diabetes mellitus with hyperglycemia: Secondary | ICD-10-CM | POA: Diagnosis not present

## 2023-06-10 DIAGNOSIS — R809 Proteinuria, unspecified: Secondary | ICD-10-CM | POA: Diagnosis not present

## 2023-06-10 DIAGNOSIS — Z794 Long term (current) use of insulin: Secondary | ICD-10-CM | POA: Diagnosis not present

## 2023-06-10 DIAGNOSIS — R1310 Dysphagia, unspecified: Secondary | ICD-10-CM | POA: Diagnosis not present

## 2023-06-10 DIAGNOSIS — D649 Anemia, unspecified: Secondary | ICD-10-CM | POA: Diagnosis not present

## 2023-06-10 DIAGNOSIS — M62830 Muscle spasm of back: Secondary | ICD-10-CM | POA: Diagnosis not present

## 2023-06-10 DIAGNOSIS — R7989 Other specified abnormal findings of blood chemistry: Secondary | ICD-10-CM | POA: Diagnosis not present

## 2023-06-10 DIAGNOSIS — F319 Bipolar disorder, unspecified: Secondary | ICD-10-CM | POA: Diagnosis not present

## 2023-06-10 DIAGNOSIS — F17200 Nicotine dependence, unspecified, uncomplicated: Secondary | ICD-10-CM | POA: Diagnosis not present

## 2023-06-10 DIAGNOSIS — M545 Low back pain, unspecified: Secondary | ICD-10-CM | POA: Diagnosis not present

## 2023-09-13 DIAGNOSIS — F319 Bipolar disorder, unspecified: Secondary | ICD-10-CM | POA: Diagnosis not present

## 2023-09-13 DIAGNOSIS — Z794 Long term (current) use of insulin: Secondary | ICD-10-CM | POA: Diagnosis not present

## 2023-10-29 ENCOUNTER — Telehealth (HOSPITAL_COMMUNITY): Payer: Self-pay | Admitting: Pharmacy Technician

## 2023-10-29 ENCOUNTER — Other Ambulatory Visit (HOSPITAL_COMMUNITY): Payer: Self-pay

## 2023-10-29 ENCOUNTER — Encounter (HOSPITAL_COMMUNITY): Payer: Self-pay

## 2023-10-29 ENCOUNTER — Emergency Department (HOSPITAL_COMMUNITY): Payer: 59

## 2023-10-29 ENCOUNTER — Emergency Department (HOSPITAL_COMMUNITY)
Admission: EM | Admit: 2023-10-29 | Discharge: 2023-10-29 | Disposition: A | Discharge status: Home / Self Care | Payer: 59 | Attending: Emergency Medicine | Admitting: Emergency Medicine

## 2023-10-29 DIAGNOSIS — Z794 Long term (current) use of insulin: Secondary | ICD-10-CM | POA: Diagnosis not present

## 2023-10-29 DIAGNOSIS — E162 Hypoglycemia, unspecified: Secondary | ICD-10-CM

## 2023-10-29 DIAGNOSIS — E11649 Type 2 diabetes mellitus with hypoglycemia without coma: Secondary | ICD-10-CM | POA: Diagnosis not present

## 2023-10-29 DIAGNOSIS — D72829 Elevated white blood cell count, unspecified: Secondary | ICD-10-CM | POA: Diagnosis not present

## 2023-10-29 DIAGNOSIS — R4182 Altered mental status, unspecified: Secondary | ICD-10-CM | POA: Diagnosis present

## 2023-10-29 LAB — CBG MONITORING, ED
Glucose-Capillary: 132 mg/dL — ABNORMAL HIGH (ref 70–99)
Glucose-Capillary: 138 mg/dL — ABNORMAL HIGH (ref 70–99)
Glucose-Capillary: 167 mg/dL — ABNORMAL HIGH (ref 70–99)
Glucose-Capillary: 276 mg/dL — ABNORMAL HIGH (ref 70–99)

## 2023-10-29 LAB — CBC WITH DIFFERENTIAL/PLATELET
Abs Immature Granulocytes: 0.02 10*3/uL (ref 0.00–0.07)
Basophils Absolute: 0.1 10*3/uL (ref 0.0–0.1)
Basophils Relative: 1 %
Eosinophils Absolute: 0.1 10*3/uL (ref 0.0–0.5)
Eosinophils Relative: 1 %
HCT: 36.3 % — ABNORMAL LOW (ref 39.0–52.0)
Hemoglobin: 11.5 g/dL — ABNORMAL LOW (ref 13.0–17.0)
Immature Granulocytes: 0 %
Lymphocytes Relative: 11 %
Lymphs Abs: 1.1 10*3/uL (ref 0.7–4.0)
MCH: 27.1 pg (ref 26.0–34.0)
MCHC: 31.7 g/dL (ref 30.0–36.0)
MCV: 85.4 fL (ref 80.0–100.0)
Monocytes Absolute: 0.4 10*3/uL (ref 0.1–1.0)
Monocytes Relative: 4 %
Neutro Abs: 8.8 10*3/uL — ABNORMAL HIGH (ref 1.7–7.7)
Neutrophils Relative %: 83 %
Platelets: 375 10*3/uL (ref 150–400)
RBC: 4.25 MIL/uL (ref 4.22–5.81)
RDW: 14.2 % (ref 11.5–15.5)
WBC: 10.6 10*3/uL — ABNORMAL HIGH (ref 4.0–10.5)
nRBC: 0 % (ref 0.0–0.2)

## 2023-10-29 LAB — COMPREHENSIVE METABOLIC PANEL
ALT: 24 U/L (ref 0–44)
AST: 30 U/L (ref 15–41)
Albumin: 3.7 g/dL (ref 3.5–5.0)
Alkaline Phosphatase: 73 U/L (ref 38–126)
Anion gap: 11 (ref 5–15)
BUN: 14 mg/dL (ref 6–20)
CO2: 23 mmol/L (ref 22–32)
Calcium: 9.3 mg/dL (ref 8.9–10.3)
Chloride: 105 mmol/L (ref 98–111)
Creatinine, Ser: 0.79 mg/dL (ref 0.61–1.24)
GFR, Estimated: >60 mL/min (ref >60)
Glucose, Bld: 62 mg/dL — ABNORMAL LOW (ref 70–99)
Potassium: 3.1 mmol/L — ABNORMAL LOW (ref 3.5–5.1)
Sodium: 139 mmol/L (ref 135–145)
Total Bilirubin: 0.3 mg/dL (ref <1.2)
Total Protein: 7.8 g/dL (ref 6.5–8.1)

## 2023-10-29 LAB — RAPID URINE DRUG SCREEN, HOSP PERFORMED
Amphetamines: POSITIVE — AB
Barbiturates: NOT DETECTED
Benzodiazepines: NOT DETECTED
Cocaine: NOT DETECTED
Opiates: NOT DETECTED
Tetrahydrocannabinol: NOT DETECTED

## 2023-10-29 LAB — URINALYSIS, ROUTINE W REFLEX MICROSCOPIC
Bacteria, UA: NONE SEEN
Bilirubin Urine: NEGATIVE
Glucose, UA: >=500 mg/dL — AB
Ketones, ur: 5 mg/dL — AB
Leukocytes,Ua: NEGATIVE
Nitrite: NEGATIVE
Protein, ur: 100 mg/dL — AB
Specific Gravity, Urine: 1.007 (ref 1.005–1.030)
pH: 7 (ref 5.0–8.0)

## 2023-10-29 LAB — BLOOD GAS, VENOUS
Acid-base deficit: 1.7 mmol/L (ref 0.0–2.0)
Bicarbonate: 24.3 mmol/L (ref 20.0–28.0)
Drawn by: 4237
O2 Saturation: 88.2 %
Patient temperature: 36.3
pCO2, Ven: 44 mm[Hg] (ref 44–60)
pH, Ven: 7.35 (ref 7.25–7.43)
pO2, Ven: 54 mm[Hg] — ABNORMAL HIGH (ref 32–45)

## 2023-10-29 LAB — ETHANOL: Alcohol, Ethyl (B): <10 mg/dL (ref <10)

## 2023-10-29 MED ORDER — POTASSIUM CHLORIDE 20 MEQ PO PACK
40.0000 meq | PACK | Freq: Once | ORAL | Status: DC
  Filled 2023-10-29: qty 2

## 2023-10-29 MED ORDER — INSULIN ASPART 100 UNIT/ML IJ SOLN
5.0000 [IU] | Freq: Once | INTRAMUSCULAR | Status: DC
  Filled 2023-10-29: qty 1

## 2023-10-29 NOTE — ED Triage Notes (Signed)
Per EMS, Pt found tachypneic, foaming at the mouth, and had a CBG reading "low."  Hx of DM, bipolar, and drug abuse.  Pt given D50  en route.  Pt noted to have a Dexcom, but site appears to not have been changed in a while.    Pt reports taking insulin this morning.

## 2023-10-29 NOTE — Discharge Instructions (Signed)
Please go straight to your physician to have your insulin pump checked.   Please return to the ED for abnormally high or abnormally low blood sugars, or if you suspect your insulin pump is not working correctly again.

## 2023-10-29 NOTE — ED Provider Notes (Signed)
Tyler Mann Provider Note   CSN: [id number] Arrival date & time: 10/29/23  4098     History  Chief Complaint  Patient presents with   Altered Mental Status   Hypoglycemia    Tyler Mann is a 42 y.o. male with PMH as listed below who presents with AMS.  Reportedly found to have glucose that read "low" with EMS. Was unresponsive at that time. Was given glucagon and woke up but is GCS 13-14, uncooperative and irritable. Denies drug use but has h/o IVDU. Denies EtOH. When asked if he has any pain anywhere or if he took too much insulin today, patient repeatedly states, "ugh, stop asking dumb questions" and doesn't answer. Has dexcom on abdomen that appears to not have been changed in a long time. Repeat glucose 130 mg/dL.   Past Medical History:  Diagnosis Date   ADD (attention deficit disorder)    Bipolar disorder (HCC)    Chronic back pain    Diabetes mellitus    type 1   Narcotic addiction (H[facility])    Hx IV heroin abuse   Neuropathy        Home Medications Prior to Admission medications   Medication Sig Start Date End Date Taking? Authorizing Provider  acetaminophen (TYLENOL) 325 MG tablet Take 2 tablets (650 mg total) by mouth every 6 (six) hours as needed for mild pain, fever or headache (or Fever >/= 101). 10/18/22   Shon Hale, MD  cyclobenzaprine (FLEXERIL) 10 MG tablet Take 10 mg by mouth 2 (two) times daily.    [provider]  gabapentin (NEURONTIN) 600 MG tablet Take 600 mg by mouth 2 (two) times daily. 10/06/22   [provider]  insulin detemir (LEVEMIR) 100 UNIT/ML injection Inject 0.14 mLs (14 Units total) into the skin at bedtime. For diabetes management Patient taking differently: Inject 14-20 Units into the skin at bedtime. For diabetes management 05/18/22 10/09/22  Pokhrel, Rebekah [place], MD  lamoTRIgine (LAMICTAL) 200 MG tablet Take 1 tablet by mouth daily.    [provider]   Lisdexamfetamine Dimesylate 50 MG CHEW TAKE 1 TABLET BY MOUTH EVERY DAY IN THE MORNING    [provider]  NOVOLOG 100 UNIT/ML injection Inject 5-20 Units into the skin 3 (three) times daily. 04/20/22   [provider]  pantoprazole (PROTONIX) 40 MG tablet Take 1 tablet (40 mg total) by mouth daily. 10/11/22   Erick Blinks, MD  risperiDONE (RISPERDAL) 1 MG tablet Take 1 tablet (1 mg total) by mouth at bedtime. 10/11/22 10/11/23  Erick Blinks, MD      Allergies    Trazodone and nefazodone    Review of Systems   Review of Systems A 10 point review of systems was performed and is negative unless otherwise reported in HPI.  Physical Exam Updated Vital Signs BP (!) 161/93   Pulse 99   Temp (!) 97.4 F (36.3 C) (Oral) Comment: Pt would not fully close his mouth  Resp (!) 21   Ht 6' (1.829 m)   Wt 81.6 kg   SpO2 98%   BMI 24.41 kg/m  Physical Exam General: Normal appearing male, lying in bed.  HEENT: PERRLA midrange pupils, Sclera anicteric, MMM, trachea midline. NCAT.  Cardiology: RRR, no murmurs/rubs/gallops. BL radial and DP pulses equal bilaterally.  Resp: Normal respiratory rate and effort. CTAB, no wheezes, rhonchi, crackles.  Abd: Soft, non-tender, non-distended. No rebound tenderness or guarding. Dexcom on abdomen with no insulin pump noted.  GU: Deferred. MSK: No peripheral edema or signs of trauma. Extremities without deformity or TTP. No cyanosis or clubbing. Skin: warm, dry.  Neuro: Somnolent, intermittently responds to questions, GCS 13-14, irritable; CNs II-XII grossly intact. MAEs. Sensation grossly intact.  Psych: Angry affect   ED Results / Procedures / Treatments   Labs (all labs ordered are listed, but only abnormal results are displayed) Labs Reviewed  CBC WITH DIFFERENTIAL/PLATELET - Abnormal; Notable for the following components:      Result Value   WBC 10.6 (*)    Hemoglobin 11.5 (*)    HCT 36.3 (*)    Neutro Abs 8.8 (*)    All  other components within normal limits  COMPREHENSIVE METABOLIC PANEL - Abnormal; Notable for the following components:   Potassium 3.1 (*)    Glucose, Bld 62 (*)    All other components within normal limits  URINALYSIS, ROUTINE W REFLEX MICROSCOPIC - Abnormal; Notable for the following components:   Color, Urine STRAW (*)    Glucose, UA >=500 (*)    Hgb urine dipstick SMALL (*)    Ketones, ur 5 (*)    Protein, ur 100 (*)    All other components within normal limits  RAPID URINE DRUG SCREEN, HOSP PERFORMED - Abnormal; Notable for the following components:   Amphetamines POSITIVE (*)    All other components within normal limits  BLOOD GAS, VENOUS - Abnormal; Notable for the following components:   pO2, Ven 54 (*)    All other components within normal limits  CBG MONITORING, ED - Abnormal; Notable for the following components:   Glucose-Capillary 132 (*)    All other components within normal limits  CBG MONITORING, ED - Abnormal; Notable for the following components:   Glucose-Capillary 138 (*)    All other components within normal limits  CBG MONITORING, ED - Abnormal; Notable for the following components:   Glucose-Capillary 167 (*)    All other components within normal limits  CBG MONITORING, ED - Abnormal; Notable for the following components:   Glucose-Capillary 276 (*)    All other components within normal limits  ETHANOL  CBG MONITORING, ED    EKG EKG Interpretation Date/Time:  Tuesday October 29 2023 09:21:53 EST Ventricular Rate:  91 PR Interval:  131 QRS Duration:  91 QT Interval:  394 QTC Calculation: 485 R Axis:   75  Text Interpretation: Ectopic atrial rhythm ST elev, probable normal early repol pattern Borderline prolonged QT interval Confirmed by Vivi Barrack [phone]) on 10/29/2023 11:09:38 AM  Radiology DG Chest Portable 1 View  Result Date: 10/29/2023 CLINICAL DATA:  Altered mental status.?  Fall. EXAM: PORTABLE CHEST 1 VIEW COMPARISON:  10/16/2022.  FINDINGS: Low lung volume. Bilateral lung fields are clear. Bilateral costophrenic angles are clear. Normal cardio-mediastinal silhouette. No acute osseous abnormalities. No acute displaced rib fracture seen. Old healed right lateral sixth rib fracture noted. The soft tissues are within normal limits. IMPRESSION: *No active disease. Electronically Signed   By: Jules Schick M.D.   On: 10/29/2023 11:23   CT Head Wo Contrast  Result Date: 10/29/2023 CLINICAL DATA:  Mental status change of unknown cause. Tachypnea. Hypoglycemia. EXAM: CT HEAD WITHOUT CONTRAST TECHNIQUE: Contiguous axial images were obtained from the base of the skull through the vertex without intravenous contrast. RADIATION DOSE REDUCTION: This exam was performed according to the departmental dose-optimization program which includes automated exposure control, adjustment of the mA and/or kV according to patient size and/or use of iterative reconstruction technique. COMPARISON:  None Available. FINDINGS: Brain: The brain shows a normal appearance without evidence of malformation, atrophy, old or acute small or large vessel infarction, mass lesion, hemorrhage, hydrocephalus or extra-axial collection. Vascular: No hyperdense vessel. No evidence of atherosclerotic calcification. Skull: Normal.  No traumatic finding.  No focal bone lesion. Sinuses/Orbits: Sinuses are clear. Orbits appear normal. Mastoids are clear. Other: None significant IMPRESSION: Normal head CT. Electronically Signed   By: Paulina Fusi M.D.   On: 10/29/2023 10:27    Procedures Procedures    Medications Ordered in ED Medications  potassium chloride (KLOR-CON) packet 40 mEq (40 mEq Oral Patient Refused/Not Given 10/29/23 1122)  insulin aspart (novoLOG) injection 5 Units (5 Units Subcutaneous Not Given 10/29/23 1526)    ED Course/ Medical Decision Making/ A&P                          Medical Decision Making Amount and/or Complexity of Data Reviewed Labs: ordered.  Decision-making details documented in ED Course. Radiology: ordered. Decision-making details documented in ED Course.  Risk Prescription drug management.    This patient presents to the ED for concern of AMS/hypoglycemia, this involves an extensive number of treatment options, and is a complaint that carries with it a high risk of complications and morbidity.  I considered the following differential and admission for this acute, potentially life threatening condition.   MDM:    Ddx of acute altered mental status or encephalopathy considered but not limited to: -Likely hypoglycemia is causing AMS, as initially read low. Will monitor closely. Has h/o T1DM and unknown how much insulin he took. Doesn't have insulin pump. His glucose is now within normal range but he is still altered. Consider possible post-ictal state, possibly had hypoglycemic seizure as well.  -Intracranial abnormalities such as ICH, hydrocephalus, head trauma - NCAT on exam. Unclear if patient fell or had any head trauma, though none on exam. Girlfriend states she found him on floor. He does have h/o drug use but will get CTH to r/o ICH.  -Infection such as UTI, PNA - afebrile, will get CXR/urine and reassess. Girlfriend at bedside states he had previously been in his NSOH. -Toxic ingestion such as EtOH, illicit drug use. No recent changes to his meds. -OD on insulin possible -Electrolyte abnormalities or hyper/hypoglycemia -Hypercarbia or hypoxia -Arrhythmia - EKG is unremarkable   Clinical Course as of 10/29/23 1529  Tue Oct 29, 2023  1109 CT Head Wo Contrast Normal head CT [HN]  1109 Potassium(!): 3.1 Mild hypokalemia, will replete [HN]  1110 Glucose-Capillary(!): 138 [HN]  1306 Glucose-Capillary(!): 167 [HN]  1310 Amphetamines(!): POSITIVE +amphetamines, has rx for lisdexamfetamine [HN]  1351 Patient is A&Ox4 now, still irritable. Does not want to answer questions even such as how do you feel. He refused potassium  supplementation. States he feels that his insulin pump isn't working correctly because yesterday he couldn't get his sugar up no matter what he hate. When asked if he has injectable insulin at home he states no. He states he would rather go to his doctor this afternoon to see if they can fix his insulin pump. Attempting to call the clinic. I will prescribe injectable insulin in the meantime until they can get their pump fixed. I advised the patient not to use the insulin pump if he is concerned it may be giving him too much insulin. Put in a page to on-site diabetes coordinator as well for assistance. [HN]  1400 D/w Lauren diabetes coordinator  off-site who agrees that SQ is best plan. She will look into his chart and make recommendations. [HN]  1522 Glucose-Capillary(!): 276 Patient's girlfriend told nurse that they can go to his doctor's office this afternoon to get his insulin pump checked and be seen. She was able to get ahold of the clinic. Ordered 5U novolog for prior to discharge d/t hyperglycemia and went to discuss with patient, but he states that he already took 12U of his own SQ novolog. He had earlier told me he didn't have any needles or any way to inject SQ insulin, but then his girlfriend just told me that he found one and did inject. He states he is monitoring his sugar on his dexcom and he is okay, he states he just wants to go to the doctor to get his insulin pump checked. Patient states he already knows himself and his sugars and will just go to the doctor now. DC w/ discharge instructions/return precautions. All questions answered to patient's satisfaction.   [HN]    Clinical Course User Index [HN] Loetta Rough, MD    Labs: I Ordered, and personally interpreted labs.  The pertinent results include:  those listed above  Imaging Studies ordered: I ordered imaging studies including CTH, CXR I independently visualized and interpreted imaging. I agree with the radiologist  interpretation  Additional history obtained from EMS, chart review, Girlfriend at bedside.   Cardiac Monitoring: The patient was maintained on a cardiac monitor.  I personally viewed and interpreted the cardiac monitored which showed an underlying rhythm of: NSR  Reevaluation: After the interventions noted above, I reevaluated the patient and found that they have :improved  Social Determinants of Health: Lives independently  Disposition:  DC  Co morbidities that complicate the patient evaluation  Past Medical History:  Diagnosis Date   ADD (attention deficit disorder)    Bipolar disorder (HCC)    Chronic back pain    Diabetes mellitus    type 1   Narcotic addiction (HCC)    Hx IV heroin abuse   Neuropathy      Medicines Meds ordered this encounter  Medications   potassium chloride (KLOR-CON) packet 40 mEq   insulin aspart (novoLOG) injection 5 Units    I have reviewed the patients home medicines and have made adjustments as needed  Problem List / ED Course: Problem List Items Addressed This Visit       Endocrine   Hypoglycemia - Primary   Other Visit Diagnoses     Altered mental status, unspecified altered mental status type                       This note was created using dictation software, which may contain spelling or grammatical errors.    Loetta Rough, MD 10/29/23 [phone]

## 2023-10-29 NOTE — Telephone Encounter (Signed)
Patient Product/process development scientist completed.    The patient is insured through  [employer] . Patient has [employer], may use a copay card, and/or apply for patient assistance if available.    Ran test claim for Lantus Pen and the current 30 day co-pay is $20.00.   This test claim was processed through [facility]- copay amounts may vary at other pharmacies due to pharmacy/plan contracts, or as the patient moves through the different stages of their insurance plan.     Roland Earl, CPHT Pharmacy Technician III Certified Patient Advocate [facility] Patient Advocate Team Direct Number: [phone]  Fax: [phone]

## 2023-10-29 NOTE — Inpatient Diabetes Management (Signed)
Inpatient Diabetes Program Recommendations  AACE/ADA: New Consensus Statement on Inpatient Glycemic Control (2015)  Target Ranges:  Prepandial:   less than 140 mg/dL      Peak postprandial:   less than 180 mg/dL (1-2 hours)      Critically ill patients:  140 - 180 mg/dL   Lab Results  Component Value Date   GLUCAP 276 (H) 10/29/2023   HGBA1C 10.4 (H) 05/15/2022    Review of Glycemic Control  Latest Reference Range & Units 10/29/23 10:46 10/29/23 12:13 10/29/23 14:26  Glucose-Capillary 70 - 99 mg/dL 119 (H) 147 (H) 829 (H)  (H): Data is abnormally high  Diabetes history: DM1(does not make insulin.  Needs correction, basal and meal coverage)   Outpatient Diabetes medications:  Omnipod/Dexcom (has not been able to purchase in the past because of insurance issues)   PCP-Zack Margo Aye (last visit was 08/16/22) Diabetes history: DM1 Outpatient Diabetes medications: Levemir 14-20 units QHS, Novolog 5-20 units TID with meals Current orders for Inpatient glycemic control: none   Inpatient Diabetes Program Recommendations:    Noted hypoglycemia this AM due to insulin pump. DM coordinator familiar with patient.  Per MD, pump has been removed.   Glucose trends now increasing.  Consider: Semglee 16 units QD (0.2 units x 79.4 kg) Novolog 5 units x 1 now  At discharge: Lantus 16 units QD Novolog 0-9 units TID and 0-5 units QHS Novolog 3 units TID with meals if consumes at least 50%  Secure chat sent to MD.  Thanks, Lujean Rave, MSN, RNC-OB Diabetes Coordinator [phone] (8a-5p)

## 2023-10-29 NOTE — ED Notes (Signed)
Pt verbalized understanding of discharge instructions. Opportunity for questions provided.  

## 2023-10-29 NOTE — ED Notes (Signed)
Pt given Diet Cola to drink w/ EDP permission.  Pt noted to be eating a Little Debbie cake when this Clinical research associate entered the room.

## 2023-10-29 NOTE — ED Notes (Signed)
Patient transported to CT 

## 2023-10-29 NOTE — ED Notes (Signed)
Pt continues to refuse to cooperate for oral or axillary temp.

## 2023-10-29 NOTE — ED Notes (Signed)
Urinal placed at bedside.  Pt currently sleeping.

## 2023-10-29 NOTE — ED Notes (Signed)
Attempted to have Pt take potassium again and he refused.  EDP made aware.

## 2024-05-14 LAB — HM DIABETES EYE EXAM

## 2024-05-25 ENCOUNTER — Encounter: Payer: Self-pay | Admitting: Student in an Organized Health Care Education/Training Program

## 2024-05-25 ENCOUNTER — Ambulatory Visit: Admitting: Student in an Organized Health Care Education/Training Program

## 2024-05-25 VITALS — BP 165/93 | HR 85 | Ht 70.8 in | Wt 193.0 lb

## 2024-05-25 DIAGNOSIS — F319 Bipolar disorder, unspecified: Secondary | ICD-10-CM | POA: Diagnosis not present

## 2024-05-25 DIAGNOSIS — E108 Type 1 diabetes mellitus with unspecified complications: Secondary | ICD-10-CM

## 2024-05-25 DIAGNOSIS — G5600 Carpal tunnel syndrome, unspecified upper limb: Secondary | ICD-10-CM | POA: Insufficient documentation

## 2024-05-25 DIAGNOSIS — G5601 Carpal tunnel syndrome, right upper limb: Secondary | ICD-10-CM

## 2024-05-25 DIAGNOSIS — E103299 Type 1 diabetes mellitus with mild nonproliferative diabetic retinopathy without macular edema, unspecified eye: Secondary | ICD-10-CM

## 2024-05-25 DIAGNOSIS — F909 Attention-deficit hyperactivity disorder, unspecified type: Secondary | ICD-10-CM | POA: Diagnosis not present

## 2024-05-25 DIAGNOSIS — F988 Other specified behavioral and emotional disorders with onset usually occurring in childhood and adolescence: Secondary | ICD-10-CM | POA: Insufficient documentation

## 2024-05-25 LAB — POCT GLYCOSYLATED HEMOGLOBIN (HGB A1C): Hemoglobin A1C: 11.8 % — AB (ref 4.0–5.6)

## 2024-05-25 MED ORDER — LAMOTRIGINE 25 MG PO TABS: ORAL_TABLET | ORAL | 0 refills | Status: DC

## 2024-05-25 MED ORDER — DEXCOM G7 SENSOR MISC: 1 refills | Status: DC

## 2024-05-25 MED ORDER — DEXCOM G7 RECEIVER DEVI: 1.0000 | 0 refills | Status: DC

## 2024-05-25 MED ORDER — NOVOLOG 100 UNIT/ML IJ SOLN: INTRAMUSCULAR | 1 refills | Status: DC

## 2024-05-25 NOTE — Progress Notes (Signed)
 New Patient Office Visit  Subjective    Patient ID: Tyler Mann, male    DOB: 20-Sep-1981  Age: 43 y.o. MRN: [id number]  CC:  Chief Complaint  Patient presents with   Establish Care    HPI  Discussed the use of AI scribe software for clinical note transcription with the patient, who gave verbal consent to proceed.  History of Present Illness Tyler Mann is a 43 year old male with type 1 diabetes and bipolar disorder who presents for assistance with medication management and insurance issues.  He is experiencing difficulties obtaining prior authorization for his Dexcom sensors, which are crucial for his blood sugar management. He has been managing type 1 diabetes since age 54 and currently uses an Omnipod insulin  pump with NovoLog  insulin . His father, who has dementia, changed his insurance without knowledge of his medications, complicating access to necessary supplies. He is technically homeless but is staying at his father's house temporarily.  He uses the Omnipod insulin  pump without long-acting insulin  and reports improved blood sugar control with Dexcom sensors. However, he has not had access to these sensors this year due to insurance issues. He uses NovoLog  insulin  in his Omnipod, typically filling it every two to three days, depending on his blood sugar levels. He has one pen of insulin  left and is concerned about running out.  He has a history of bipolar disorder and ADHD, previously managed with lamotrigine  and Vyvanse , respectively. He has not been on any medication for his bipolar disorder recently, which he describes as 'bad, bad,' affecting his ability to get out of bed and causing significant distress. Lamotrigine  at 200 mg worked well for him in the past. He has not been able to see a psychiatrist due to insurance and financial constraints.  He mentions a history of carpal tunnel surgery on his left arm and expresses interest in having the right arm evaluated  for similar issues. He experiences occasional burning sensations in his feet and has been prescribed gabapentin  in the past, but does not regularly take it as it was not helpful.      Outpatient Encounter Medications as of 05/25/2024  Medication Sig   Continuous Glucose Receiver (DEXCOM G7 RECEIVER) DEVI 1 Device by Does not apply route continuous.   Continuous Glucose Sensor (DEXCOM G7 SENSOR) MISC Insert a sensor on your arm every 10 days   Insulin  Disposable Pump (OMNIPOD 5 DEXG7G6 INTRO GEN 5) KIT SMARTSIG:SUB-Q Every 3 Days   lamoTRIgine  (LAMICTAL ) 25 MG tablet Take 1 tablet (25 mg total) by mouth daily for 14 days, THEN 2 tablets (50 mg total) daily for 14 days.   [DISCONTINUED] acetaminophen  (TYLENOL ) 325 MG tablet Take 2 tablets (650 mg total) by mouth every 6 (six) hours as needed for mild pain, fever or headache (or Fever >/= 101).   [DISCONTINUED] cyclobenzaprine  (FLEXERIL ) 10 MG tablet Take 10 mg by mouth 2 (two) times daily.   [DISCONTINUED] levocetirizine (XYZAL) 5 MG tablet Take 5 mg by mouth at bedtime.   [DISCONTINUED] NOVOLOG  100 UNIT/ML injection Inject 5-20 Units into the skin 3 (three) times daily.   NOVOLOG  100 UNIT/ML injection Infuse 200mg  of novolog  (2mL) into the Omnipod device every 3 days   [DISCONTINUED] gabapentin  (NEURONTIN ) 600 MG tablet Take 600 mg by mouth 2 (two) times daily. (Patient not taking: Reported on 05/25/2024)   [DISCONTINUED] insulin  detemir (LEVEMIR ) 100 UNIT/ML injection Inject 0.14 mLs (14 Units total) into the skin at bedtime. For diabetes management (  Patient taking differently: Inject 14-20 Units into the skin at bedtime. For diabetes management)   [DISCONTINUED] lamoTRIgine  (LAMICTAL ) 200 MG tablet Take 1 tablet by mouth daily. (Patient not taking: Reported on 05/25/2024)   [DISCONTINUED] Lisdexamfetamine Dimesylate  50 MG CHEW TAKE 1 TABLET BY MOUTH EVERY DAY IN THE MORNING (Patient not taking: Reported on 05/25/2024)   [DISCONTINUED] pantoprazole   (PROTONIX ) 40 MG tablet Take 1 tablet (40 mg total) by mouth daily. (Patient not taking: Reported on 05/25/2024)   [DISCONTINUED] risperiDONE  (RISPERDAL ) 1 MG tablet Take 1 tablet (1 mg total) by mouth at bedtime. (Patient not taking: Reported on 05/25/2024)   No facility-administered encounter medications on file as of 05/25/2024.    Past Medical History:  Diagnosis Date   ADD (attention deficit disorder)    Bipolar disorder (HCC)    Chronic back pain    Diabetes mellitus    type 1   Narcotic addiction (HCC)    Hx IV heroin abuse   Neuropathy     Past Surgical History:  Procedure Laterality Date   INCISION AND DRAINAGE     MOUTH SURGERY      History reviewed. No pertinent family history.      Objective    BP (!) 165/93   Pulse 85   Ht 5' 10.8 (1.798 m)   Wt 193 lb (87.5 kg)   BMI 27.07 kg/m   Physical Exam  Physical Exam HEENT: Ears normal bilaterally. Throat normal. NECK: Thyroid normal. No lymphadenopathy. CHEST: Lungs clear to auscultation, no wheezing. CARDIOVASCULAR: Heart sounds normal, no murmur, regular rhythm. ABDOMEN: Abdomen normal. EXTREMITIES: Joints normal, no leg swelling. SKIN: Skin normal, few moles noted. Psych: Anxious appearing mood.  Angry appearing at times.  Frustrated, pressured speech, linear thoughts, organized speech and understandable. Neuro: Alert, conversational, full strength upper and lower extremities, normal gait and balance      Assessment & Plan:    Problem List Items Addressed This Visit       High   Diabetes mellitus type 1 (HCC) - Primary (Chronic)   Type 1 diabetes managed with Omnipod and NovoLog . Glycemic control improved with Dexcom, currently inaccessible. Mild neuropathy in feet. Insurance issues with Dexcom and NovoLog . - Prescribe NovoLog  insulin  vials and syringes. - Initiate prior authorization for Dexcom sensors. - Request pharmacist assistance to set up Omnipod 5 system. - Advise continued use of current  Omnipod system until Dexcom is available. - Consider [place] pharmacy for better coordination of supplies.      Relevant Medications   NOVOLOG  100 UNIT/ML injection   Continuous Glucose Receiver (DEXCOM G7 RECEIVER) DEVI   Continuous Glucose Sensor (DEXCOM G7 SENSOR) MISC   Bipolar disease, chronic (HCC) (Chronic)   Bipolar disorder unmanaged due to lack of medication. Previously controlled on lamotrigine  and Vyvanse . Significant mood instability reported. - Prescribe lamotrigine , starting at a lower dose due to lapse in treatment. - Coordinate with a psychiatrist for long-term management. - Discuss potential insurance coverage for psychiatric services.      Relevant Medications   lamoTRIgine  (LAMICTAL ) 25 MG tablet   Other Relevant Orders   Ambulatory referral to Psychiatry     Medium    ADD (attention deficit disorder) (Chronic)   ADHD previously managed with Vyvanse , currently unavailable due to shortage and insurance issues. Symptoms difficult to manage without medication. - Discuss with psychiatrist regarding resumption of Vyvanse  or alternative ADHD management.        Unprioritized   Carpal tunnel syndrome   Carpal tunnel  syndrome with previous left arm surgery. Right arm surgery recommended but not performed. Patient interested in pursuing surgery for right arm. - Evaluate right arm for carpal tunnel syndrome and consider referral for surgical consultation.      Relevant Medications   lamoTRIgine  (LAMICTAL ) 25 MG tablet   Other Visit Diagnoses       Type 1 diabetes mellitus with complications (HCC)       Relevant Medications   NOVOLOG  100 UNIT/ML injection   Other Relevant Orders   POCT glycosylated hemoglobin (Hb A1C) (Completed)       Return in about 4 weeks (around 06/22/2024) for diabetes management.   [name] Debby Specking, MD

## 2024-05-25 NOTE — Patient Instructions (Addendum)
  VISIT SUMMARY: You came in today to discuss medication management and insurance issues related to your type 1 diabetes and bipolar disorder. We also reviewed your ADHD and carpal tunnel syndrome concerns.  YOUR PLAN: -TYPE 1 DIABETES MELLITUS: Type 1 diabetes is a condition where your body does not produce insulin , requiring you to manage your blood sugar levels with insulin  therapy. We will prescribe NovoLog  insulin  vials and syringes, initiate prior authorization for Dexcom sensors, and request pharmacist assistance to set up the Omnipod 5 system. Continue using your current Omnipod system until Dexcom is available. Consider using [place] pharmacy for better coordination of supplies.  -BIPOLAR DISORDER: Bipolar disorder is a mental health condition that causes extreme mood swings. We will prescribe lamotrigine , starting at a lower dose due to your lapse in treatment, and coordinate with a psychiatrist for long-term management. We will also discuss potential insurance coverage for psychiatric services.  -ATTENTION-DEFICIT/HYPERACTIVITY DISORDER (ADHD): ADHD is a condition characterized by symptoms of inattention, hyperactivity, and impulsivity. We will discuss with a psychiatrist regarding the resumption of Vyvanse  or alternative ADHD management.  -CARPAL TUNNEL SYNDROME: Carpal tunnel syndrome is a condition that causes pain, numbness, and tingling in the hand and arm. We will evaluate your right arm for carpal tunnel syndrome and consider a referral for surgical consultation.  INSTRUCTIONS: We will follow up with you regarding the prior authorization for Dexcom sensors and coordinate with a psychiatrist for your bipolar disorder and ADHD management. Please continue using your current Omnipod system and NovoLog  insulin  as prescribed. Consider using [place] pharmacy for better coordination of your diabetes supplies.

## 2024-05-25 NOTE — Assessment & Plan Note (Signed)
 ADHD previously managed with Vyvanse , currently unavailable due to shortage and insurance issues. Symptoms difficult to manage without medication. - Discuss with psychiatrist regarding resumption of Vyvanse  or alternative ADHD management.

## 2024-05-25 NOTE — Assessment & Plan Note (Signed)
 Bipolar disorder unmanaged due to lack of medication. Previously controlled on lamotrigine  and Vyvanse . Significant mood instability reported. - Prescribe lamotrigine , starting at a lower dose due to lapse in treatment. - Coordinate with a psychiatrist for long-term management. - Discuss potential insurance coverage for psychiatric services.

## 2024-05-25 NOTE — Assessment & Plan Note (Signed)
 Carpal tunnel syndrome with previous left arm surgery. Right arm surgery recommended but not performed. Patient interested in pursuing surgery for right arm. - Evaluate right arm for carpal tunnel syndrome and consider referral for surgical consultation.

## 2024-05-25 NOTE — Assessment & Plan Note (Signed)
 Type 1 diabetes managed with Omnipod and NovoLog . Glycemic control improved with Dexcom, currently inaccessible. Mild neuropathy in feet. Insurance issues with Dexcom and NovoLog . - Prescribe NovoLog  insulin  vials and syringes. - Initiate prior authorization for Dexcom sensors. - Request pharmacist assistance to set up Omnipod 5 system. - Advise continued use of current Omnipod system until Dexcom is available. - Consider [place] pharmacy for better coordination of supplies.

## 2024-05-26 ENCOUNTER — Telehealth: Payer: Self-pay | Admitting: Pharmacist

## 2024-05-26 ENCOUNTER — Other Ambulatory Visit (HOSPITAL_COMMUNITY): Payer: Self-pay

## 2024-05-26 ENCOUNTER — Telehealth: Payer: Self-pay

## 2024-05-26 DIAGNOSIS — E103299 Type 1 diabetes mellitus with mild nonproliferative diabetic retinopathy without macular edema, unspecified eye: Secondary | ICD-10-CM

## 2024-05-26 NOTE — Telephone Encounter (Signed)
-----   Message from [employer] sent at 05/25/2024  4:12 PM EDT ----- Hi Tyler Mann,   This person has type 1 diabetes.  He has an OmniPod 5 starter box, but would like help in the set up.  Previously used OmniPod 4.  I am also prescribing him a Dexcom device, and asking the Rx team to help with prior Auth.   [name]

## 2024-05-26 NOTE — Telephone Encounter (Signed)
 Called patient and was able to speak to him briefly. We are waiting on prior authorization for the Dex Com G7 and then can try to start his Omni Pod 5 and DexCom.  Patient could not speak longer (was talking to police) . Will try to call him back in 1 to 2 days to discuss further.

## 2024-05-26 NOTE — Telephone Encounter (Signed)
 Pharmacy Patient Advocate Encounter  Received notification from [facility] that Prior Authorization for Dexcom G7 Sensor has been APPROVED from 05/26/24 to 11/26/24   PA #/Case ID/Reference #: [id number]

## 2024-05-26 NOTE — Telephone Encounter (Signed)
 Pharmacy Patient Advocate Encounter  Received notification from [facility] that Prior Authorization for [facility] G7 Receiver device has been APPROVED from 05/26/24 to 11/26/24   PA #/Case ID/Reference #: [id number]

## 2024-05-26 NOTE — Telephone Encounter (Signed)
 Called patient to relay dexacom g7 was approved. Patient states pharmacy is stating the insulin  prescriptions does not look like it is written correctly. I will pharmacy a call to try and get more information.

## 2024-05-26 NOTE — Telephone Encounter (Signed)
 Pharmacy Patient Advocate Encounter   Received notification from Physician's Office that prior authorization for Dexcom G7 Sensor is required/requested.   Insurance verification completed.   The patient is insured through [employer] .   Per test claim: PA required; PA submitted to above mentioned insurance via CoverMyMeds Key/confirmation #/EOC [id number] Status is pending

## 2024-05-26 NOTE — Telephone Encounter (Signed)
 Pharmacy Patient Advocate Encounter   Received notification from Physician's Office that prior authorization for Dexcom G7 Receiver device is required/requested.   Insurance verification completed.   The patient is insured through [employer] .   Per test claim: PA required; PA submitted to above mentioned insurance via CoverMyMeds Key/confirmation #/EOC [id number] Status is pending

## 2024-05-28 NOTE — Telephone Encounter (Signed)
 Spoke with patient to let him know DexCom was approved. Set up appointment for education on DexCom and Omni Pod for tomorrow 05/29/2024 - will meet patient at [facility] [place] office.

## 2024-05-29 ENCOUNTER — Other Ambulatory Visit: Payer: Self-pay | Admitting: Pharmacist

## 2024-05-29 DIAGNOSIS — E103299 Type 1 diabetes mellitus with mild nonproliferative diabetic retinopathy without macular edema, unspecified eye: Secondary | ICD-10-CM

## 2024-05-29 MED ORDER — NOVOLOG 100 UNIT/ML IJ SOLN: INTRAMUSCULAR | Status: DC

## 2024-05-29 NOTE — Telephone Encounter (Signed)
 I spoke with the patient by phone.  There is no issue with the insulin , pharmacy was able to dispense it.  Great to hear that he had the Dexcom approved, picked it up from pharmacy yesterday.  He is meeting with Madelin Ray later today to set up the Dexcom with his OmniPod.

## 2024-05-29 NOTE — Addendum Note (Signed)
 Addended by: JERRELL SOLIAN T on: 05/29/2024 07:56 AM   Modules accepted: Orders

## 2024-05-29 NOTE — Progress Notes (Signed)
 05/29/2024 Name: GARLAND HINCAPIE MRN: [id number] DOB: 1981-07-15  Chief Complaint  Patient presents with   Diabetes    SIRIS HOOS is a 43 y.o. year old male who was referred for medication management by their primary care provider, Jerrell [name] Ned, MD. They presented for a face to face visit today.   They were referred to the pharmacist by their PCP for assistance in managing diabetes    Subjective:  Care Team: Primary Care Provider: Jerrell [name] Ned, MD ; Next Scheduled Visit: 06/22/2024  Medication Access/Adherence  Current Pharmacy:  [facility] [phone] - [place], [place] - [address] AT [facility] [address] [place] [address] Phone: [phone] Fax: [phone]   Patient reports affordability concerns with their medications: No  Patient reports access/transportation concerns to their pharmacy: No  Patient reports adherence concerns with their medications:  Yes  Has not started Dex Com 7 yet and not sure how to use DexCom and his OmniPod together   Diabetes: Type 1 diabetic - diagnosed when he was 43 years old. He saw endocrinologist when he was first diagnosed but has been over 10 years since his last visit.  He is a new patient to the [facility]. He was started on OmniPod insulid delivery system by his previous PCP.   Current medications:  Novolog  insulin  - use OmniPod pump  Medications tried in the past: Basal / bolus insulin  regimen  Current glucose readings: Has not had DexCom sensors recently.   I was able to help patient reset his account and see settings for his insulin  pump / Omni Pod:  Max Basal Rate = 3.0 u / hour Baal Rate = 1.05 / hr (same rate all day) Insulin  to CHO ration - not set Insulin  sensitivity ratio / correction - not set Duration of insulin  action - 4 hours Goal blood glucose = 140   Patient reports hypoglycemic s/sx including shakiness,  sweating- most likely to occur between 2am and 6am or when he is working outside.  Patient denies hyperglycemic symptoms including no polyuria, polydipsia, polyphagia, nocturia, neuropathy, blurred vision.  Current medication access support: none   Objective:  Lab Results  Component Value Date   HGBA1C 11.8 (A) 05/25/2024    Lab Results  Component Value Date   CREATININE 0.79 10/29/2023   BUN 14 10/29/2023   NA 139 10/29/2023   K 3.1 (L) 10/29/2023   CL 105 10/29/2023   CO2 23 10/29/2023    Lab Results  Component Value Date   TRIG 240 (H) 12/30/2014    Medications Reviewed Today     Reviewed by Carla Milling, RPH-CPP (Pharmacist) on 05/29/24 at 1329  Med List Status: <None>   Medication Order Taking? Sig Documenting Provider Last Dose Status Informant  Continuous Glucose Receiver (DEXCOM G7 RECEIVER) DEVI [id number]  1 Device by Does not apply route continuous.  Patient not taking: Reported on 05/29/2024   Jerrell [name] Ned, MD  Active   Continuous Glucose Sensor (DEXCOM G7 [place]) [place] [id number]  Insert a sensor on your arm every 10 days  Patient not taking: Reported on 05/29/2024   Jerrell [name] Ned, MD  Active   Insulin  Disposable Pump (OMNIPOD 5 DEXG7G6 INTRO GEN 5) KIT [id number] Yes SMARTSIG:SUB-Q Every 3 Days [provider]  Active   lamoTRIgine  (LAMICTAL ) 25 MG tablet [id number] Yes Take 1 tablet (25 mg total) by mouth daily for 14 days, THEN  2 tablets (50 mg total) daily for 14 days. Jerrell [name] Ned, MD  Active   NOVOLOG  100 UNIT/ML injection [id number] Yes Infuse 200 units of novolog  (2mL) into the Omnipod device every 3 days Jerrell [name] Ned, MD  Active               Assessment/Plan:   Diabetes: - Currently uncontrolled - Reviewed long term cardiovascular and renal outcomes of uncontrolled blood sugar - Reviewed goal A1c, goal fasting, and goal 2 hour post prandial glucose - Assisted with resetting credentials for OnmiPod  site. Then retrieved OmniPod setting - Set up Omnipod with the following setting:   Basal Rate - 0.95 units / hour from 12am to 6am (lowered basal rate since patient has frequent hypoglycemic events during this time.     - 1.05 units / hour from 6am to 12am (same as previous)  Insulin  : CHO ratio = 1: 15  Sensitivity Factor (how much 1 unit of insulin  will lower blood glucose) = 50   Duration of insulin  - 4 hours (same a previous)  - Assisted patient in setting up communication between [facility] G7 sensor and his Omni Pod pump.  - Connected with patient on DexCom Clarity site.     Follow Up Plan: 5 to 7 days  Madelin Ray, PharmD Clinical Pharmacist [facility]  Population Health [phone]

## 2024-06-05 ENCOUNTER — Telehealth: Payer: Self-pay | Admitting: Pharmacist

## 2024-06-05 ENCOUNTER — Other Ambulatory Visit: Payer: Self-pay | Admitting: Pharmacist

## 2024-06-05 NOTE — Telephone Encounter (Signed)
 Attempt was made to contact patient by phone today for follow up by Clinical Pharmacist regarding diabetes and insulin  adjustments.  Unable to reach patient. LM on VM with my contact number [phone] or [facility] office [phone]

## 2024-06-12 NOTE — Telephone Encounter (Signed)
 Patient called back and we discussed his OmniPod pump and Dex Com G7 sensor.  Patient had changed G7 sensor and was having trouble getting it to syn with his pump. He reached out to his pump trainer with Omnipod / Insulet and she was able to connect the pump and sensor and get him back in automated mode.  His blood glucose was high 7/23 before automated mode was restarted. Today blood glucose looks better. Will give it a few more day in automated mode and then check back with patient.

## 2024-06-18 ENCOUNTER — Observation Stay (HOSPITAL_COMMUNITY)
Admission: EM | Admit: 2024-06-18 | Discharge: 2024-06-19 | Disposition: A | Discharge status: Home / Self Care | Attending: Emergency Medicine | Admitting: Emergency Medicine

## 2024-06-18 ENCOUNTER — Other Ambulatory Visit: Payer: Self-pay

## 2024-06-18 ENCOUNTER — Encounter (HOSPITAL_COMMUNITY): Payer: Self-pay | Admitting: Emergency Medicine

## 2024-06-18 DIAGNOSIS — F909 Attention-deficit hyperactivity disorder, unspecified type: Secondary | ICD-10-CM | POA: Diagnosis not present

## 2024-06-18 DIAGNOSIS — N179 Acute kidney failure, unspecified: Principal | ICD-10-CM | POA: Diagnosis present

## 2024-06-18 DIAGNOSIS — F900 Attention-deficit hyperactivity disorder, predominantly inattentive type: Secondary | ICD-10-CM | POA: Diagnosis not present

## 2024-06-18 DIAGNOSIS — E109 Type 1 diabetes mellitus without complications: Secondary | ICD-10-CM | POA: Insufficient documentation

## 2024-06-18 DIAGNOSIS — E103299 Type 1 diabetes mellitus with mild nonproliferative diabetic retinopathy without macular edema, unspecified eye: Secondary | ICD-10-CM

## 2024-06-18 DIAGNOSIS — F319 Bipolar disorder, unspecified: Secondary | ICD-10-CM | POA: Insufficient documentation

## 2024-06-18 DIAGNOSIS — F1190 Opioid use, unspecified, uncomplicated: Secondary | ICD-10-CM | POA: Diagnosis not present

## 2024-06-18 DIAGNOSIS — F988 Other specified behavioral and emotional disorders with onset usually occurring in childhood and adolescence: Secondary | ICD-10-CM | POA: Diagnosis present

## 2024-06-18 DIAGNOSIS — T675XXA Heat exhaustion, unspecified, initial encounter: Secondary | ICD-10-CM | POA: Diagnosis present

## 2024-06-18 DIAGNOSIS — F15920 Other stimulant use, unspecified with intoxication, uncomplicated: Secondary | ICD-10-CM | POA: Diagnosis not present

## 2024-06-18 LAB — CBG MONITORING, ED
Glucose-Capillary: 76 mg/dL (ref 70–99)
Glucose-Capillary: 61 mg/dL — ABNORMAL LOW (ref 70–99)
Glucose-Capillary: 204 mg/dL — ABNORMAL HIGH (ref 70–99)

## 2024-06-18 LAB — CBC WITH DIFFERENTIAL/PLATELET
Abs Immature Granulocytes: 0.1 K/uL — ABNORMAL HIGH (ref 0.00–0.07)
Basophils Absolute: 0.1 K/uL (ref 0.0–0.1)
Basophils Relative: 1 %
Eosinophils Absolute: 0.1 K/uL (ref 0.0–0.5)
Eosinophils Relative: 0 %
HCT: 37.9 % — ABNORMAL LOW (ref 39.0–52.0)
Hemoglobin: 12.4 g/dL — ABNORMAL LOW (ref 13.0–17.0)
Immature Granulocytes: 1 %
Lymphocytes Relative: 14 %
Lymphs Abs: 2.3 K/uL (ref 0.7–4.0)
MCH: 28.6 pg (ref 26.0–34.0)
MCHC: 32.7 g/dL (ref 30.0–36.0)
MCV: 87.3 fL (ref 80.0–100.0)
Monocytes Absolute: 1.3 K/uL — ABNORMAL HIGH (ref 0.1–1.0)
Monocytes Relative: 8 %
Neutro Abs: 12.7 K/uL — ABNORMAL HIGH (ref 1.7–7.7)
Neutrophils Relative %: 76 %
Platelets: 393 K/uL (ref 150–400)
RBC: 4.34 MIL/uL (ref 4.22–5.81)
RDW: 15.1 % (ref 11.5–15.5)
WBC: 16.6 K/uL — ABNORMAL HIGH (ref 4.0–10.5)
nRBC: 0 % (ref 0.0–0.2)

## 2024-06-18 LAB — COMPREHENSIVE METABOLIC PANEL WITH GFR
ALT: 31 U/L (ref 0–44)
AST: 33 U/L (ref 15–41)
Albumin: 4.9 g/dL (ref 3.5–5.0)
Alkaline Phosphatase: 122 U/L (ref 38–126)
Anion gap: 17 — ABNORMAL HIGH (ref 5–15)
BUN: 40 mg/dL — ABNORMAL HIGH (ref 6–20)
CO2: 19 mmol/L — ABNORMAL LOW (ref 22–32)
Calcium: 10.4 mg/dL — ABNORMAL HIGH (ref 8.9–10.3)
Chloride: 103 mmol/L (ref 98–111)
Creatinine, Ser: 3.58 mg/dL — ABNORMAL HIGH (ref 0.61–1.24)
GFR, Estimated: 21 mL/min — ABNORMAL LOW (ref >60)
Glucose, Bld: 57 mg/dL — ABNORMAL LOW (ref 70–99)
Potassium: 3.4 mmol/L — ABNORMAL LOW (ref 3.5–5.1)
Sodium: 139 mmol/L (ref 135–145)
Total Bilirubin: 1.2 mg/dL (ref 0.0–1.2)
Total Protein: 9 g/dL — ABNORMAL HIGH (ref 6.5–8.1)

## 2024-06-18 LAB — CK: Total CK: 989 U/L — ABNORMAL HIGH (ref 49–397)

## 2024-06-18 MED ORDER — ACETAMINOPHEN 325 MG PO TABS: 650.0000 mg | ORAL_TABLET | Freq: Four times a day (QID) | ORAL | Status: DC | PRN

## 2024-06-18 MED ORDER — SODIUM CHLORIDE 0.9 % IV BOLUS
1000.0000 mL | Freq: Once | INTRAVENOUS | Status: AC
  Administered 2024-06-18: 1000 mL via INTRAVENOUS

## 2024-06-18 MED ORDER — POTASSIUM CHLORIDE CRYS ER 20 MEQ PO TBCR
40.0000 meq | EXTENDED_RELEASE_TABLET | Freq: Once | ORAL | Status: AC
  Administered 2024-06-18: 40 meq via ORAL
  Filled 2024-06-18: qty 2

## 2024-06-18 MED ORDER — ENOXAPARIN SODIUM 30 MG/0.3ML IJ SOSY: 30.0000 mg | PREFILLED_SYRINGE | INTRAMUSCULAR | Status: DC

## 2024-06-18 MED ORDER — LAMOTRIGINE 25 MG PO TABS
25.0000 mg | ORAL_TABLET | Freq: Every day | ORAL | Status: DC
  Administered 2024-06-18: 25 mg via ORAL
  Filled 2024-06-18: qty 1

## 2024-06-18 MED ORDER — ACETAMINOPHEN 650 MG RE SUPP: 650.0000 mg | Freq: Four times a day (QID) | RECTAL | Status: DC | PRN

## 2024-06-18 MED ORDER — ONDANSETRON HCL 4 MG PO TABS: 4.0000 mg | ORAL_TABLET | Freq: Four times a day (QID) | ORAL | Status: DC | PRN

## 2024-06-18 MED ORDER — ONDANSETRON HCL 4 MG/2ML IJ SOLN: 4.0000 mg | Freq: Four times a day (QID) | INTRAMUSCULAR | Status: DC | PRN

## 2024-06-18 MED ORDER — SODIUM CHLORIDE 0.9 % IV SOLN: INTRAVENOUS | Status: DC

## 2024-06-18 MED ORDER — INSULIN PUMP
Freq: Three times a day (TID) | SUBCUTANEOUS | Status: DC
  Filled 2024-06-18: qty 1

## 2024-06-18 NOTE — ED Notes (Signed)
 Pt able to hold down crackers and drink

## 2024-06-18 NOTE — ED Notes (Signed)
 Pt can eat and drink.

## 2024-06-18 NOTE — Progress Notes (Signed)
 PHARMACIST - PHYSICIAN COMMUNICATION  CONCERNING:  Enoxaparin  (Lovenox ) for DVT Prophylaxis    RECOMMENDATION: Patient was prescribed enoxaprin 40mg  q24 hours for VTE prophylaxis.   Filed Weights   06/18/24 1458 06/18/24 2020  Weight: 87 kg (191 lb 12.8 oz) 81.1 kg (178 lb 12.7 oz)    Body mass index is 25.65 kg/m.  Estimated Creatinine Clearance: 27.2 mL/min (A) (by C-G formula based on SCr of 3.58 mg/dL (H)).   Based on [facility] policy patient is candidate for enoxaparin  30mg  every 24 hours based on CrCl <27ml/min   DESCRIPTION: Pharmacy has adjusted enoxaparin  dose per [facility] policy.  Patient is now receiving enoxaparin  30 mg every 24 hours    Adriana JONETTA Bolster, PharmD Clinical Pharmacist  06/18/2024 8:24 PM

## 2024-06-18 NOTE — Assessment & Plan Note (Signed)
 Follow up as outpatient.

## 2024-06-18 NOTE — ED Notes (Signed)
OJ given to pt  

## 2024-06-18 NOTE — ED Notes (Signed)
 Sandwhich and chips given to pt.

## 2024-06-18 NOTE — Assessment & Plan Note (Signed)
 Will resume insulin  pump

## 2024-06-18 NOTE — ED Notes (Signed)
 Pt up ambulating to use the phone to call family.

## 2024-06-18 NOTE — ED Provider Notes (Signed)
 [place] EMERGENCY DEPARTMENT AT [facility] Provider Note   CSN: [id number] Arrival date & time: 06/18/24  1448     Patient presents with: Heat Exposure   Tyler Mann is a 43 y.o. 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  He does note that he is a type I diabetic, has not insulin  pump for the same however urgent when he became weak the insulin  pump was inadvertently removed.  EMS treated a sugar of 60 with oral glucose and 500 mL of normal saline.  Further medical history is noted of bipolar disorder along with generalized anxiety and ADHD.  Endorses profound sweating while he was outside, then began to have dizziness, nearly syncopized prior to sitting down, having EMS call for further assessment.   HPI     Prior to Admission medications   Medication Sig Start Date End Date Taking? Authorizing Provider  Continuous Glucose Receiver (DEXCOM G7 RECEIVER) DEVI 1 Device by Does not apply route continuous. Patient not taking: Reported on 05/29/2024 05/25/24   Jerrell [name] Ned, MD  Continuous Glucose Sensor (DEXCOM G7 SENSOR) MISC Insert a sensor on your arm every 10 days Patient not taking: Reported on 05/29/2024 05/25/24   Jerrell [name] Ned, MD  Insulin  Disposable Pump (OMNIPOD 5 DEXG7G6 INTRO GEN 5) KIT SMARTSIG:SUB-Q Every 3 Days 03/03/24   [provider]  lamoTRIgine  (LAMICTAL ) 25 MG tablet Take 1 tablet (25 mg total) by mouth daily for 14 days, THEN 2 tablets (50 mg total) daily for 14 days. 05/25/24 06/22/24  Jerrell [name] Ned, MD  NOVOLOG  100 UNIT/ML injection Infuse 200 units of novolog  (2mL) into the Omnipod device every 3 days 05/29/24   Jerrell [name] Ned, MD    Allergies: Trazodone  and nefazodone    Review of Systems  Neurological:  Positive for weakness.  All other systems reviewed and are negative.   Updated Vital Signs BP  136/84 (BP Location: Left Arm)   Pulse 98   Temp 98.2 F (36.8 C)   Resp 16   Ht 5' 10 (1.778 m)   Wt 87 kg   SpO2 99%   BMI 27.52 kg/m   Physical Exam Vitals and nursing note reviewed.  Constitutional:      General: He is not in acute distress.    Appearance: Normal appearance.  HENT:     Head: Normocephalic and atraumatic.     Mouth/Throat:     Mouth: Mucous membranes are moist.     Pharynx: Oropharynx is clear.  Eyes:     Extraocular Movements: Extraocular movements intact.     Conjunctiva/sclera: Conjunctivae normal.     Pupils: Pupils are equal, round, and reactive to light.  Cardiovascular:     Rate and Rhythm: Normal rate and regular rhythm.     Pulses: Normal pulses.     Heart sounds: Normal heart sounds. No murmur heard.    No friction rub. No gallop.  Pulmonary:     Effort: Pulmonary effort is normal.     Breath sounds: Normal breath sounds.  Abdominal:     General: Abdomen is flat. Bowel sounds are normal.     Palpations: Abdomen is soft.  Musculoskeletal:        General: Normal range of motion.     Cervical back: Normal range of motion and neck supple.     Right lower leg: No edema.  Left lower leg: No edema.  Skin:    General: Skin is warm and dry.     Capillary Refill: Capillary refill takes less than 2 seconds.  Neurological:     General: No focal deficit present.     Mental Status: He is alert. Mental status is at baseline.  Psychiatric:        Mood and Affect: Mood normal.     (all labs ordered are listed, but only abnormal results are displayed) Labs Reviewed  CBC WITH DIFFERENTIAL/PLATELET - Abnormal; Notable for the following components:      Result Value   WBC 16.6 (*)    Hemoglobin 12.4 (*)    HCT 37.9 (*)    Neutro Abs 12.7 (*)    Monocytes Absolute 1.3 (*)    Abs Immature Granulocytes 0.10 (*)    All other components within normal limits  COMPREHENSIVE METABOLIC PANEL WITH GFR - Abnormal; Notable for the following components:    Potassium 3.4 (*)    CO2 19 (*)    Glucose, Bld 57 (*)    BUN 40 (*)    Creatinine, Ser 3.58 (*)    Calcium  10.4 (*)    Total Protein 9.0 (*)    GFR, Estimated 21 (*)    Anion gap 17 (*)    All other components within normal limits  CK - Abnormal; Notable for the following components:   Total CK 989 (*)    All other components within normal limits  CBG MONITORING, ED - Abnormal; Notable for the following components:   Glucose-Capillary 61 (*)    All other components within normal limits  CBG MONITORING, ED - Abnormal; Notable for the following components:   Glucose-Capillary 204 (*)    All other components within normal limits  CBG MONITORING, ED    EKG: None  Radiology: No results found.   Procedures   Medications Ordered in the ED  sodium chloride  0.9 % bolus 1,000 mL (0 mLs Intravenous Stopped 06/18/24 1641)    Clinical Course as of 06/18/24 1837  Thu Jun 18, 2024  1738 Creatinine(!): 3.58 Noted, continuous IV fluids to be given. [JG]    Clinical Course User Index [JG] Tyler Dorn BROCKS, PA                                 Medical Decision Making Amount and/or Complexity of Data Reviewed Labs: ordered. Decision-making details documented in ED Course.  Risk Decision regarding hospitalization.   Medical Decision Making:   [name] is a 43 y.o. male who presented to the ED today with generalized weakness and near syncope secondary to heat exposure detailed above.    Patient's presentation is complicated by their history of type 1 diabetes.  Complete initial physical exam performed, notably the patient  was alert and oriented in no apparent distress.  No notable exam findings..    Reviewed and confirmed nursing documentation for past medical history, family history, social history.    Initial Assessment:   With the patient's presentation of weakness, most likely diagnosis is heat exhaustion.  Further considered possibility of hypoglycemia,  vasovagal syncope, acute neurovascular insult.   Initial Plan:  Secondary to heat exposure and possibility for rhabdomyolysis, CK ordered Screening labs including CBC and Metabolic panel to evaluate for infectious or metabolic etiology of disease.  Objective evaluation as below reviewed   Initial Study Results:   Laboratory  All  laboratory results reviewed without evidence of clinically relevant pathology.   Exceptions include:  Weakness and fall creatinine is 3.58 with BUN of 40 and corresponding GFR 21, leukocytosis of 16.2, CK of 989   Reassessment and Plan:   After assessment of this patient, and of the objective review of the data, findings are consistent with substantial AKI secondary to fluid volume deficit.  Plan at this time is to continue IV hydration and admit for continued monitoring of his renal function as well as continued IV fluids.       Final diagnoses:  AKI (acute kidney injury) (HCC)  Heat exhaustion, initial encounter    ED Discharge Orders     None          Tyler Mann, [place] 06/18/24 AUGUSTIN    Suzette Pac, MD 06/19/24 1515

## 2024-06-18 NOTE — Assessment & Plan Note (Signed)
 Pre renal renal failure with hypokalemia  Plan to continue IV fluids with isotonic saline at 100 ml per hr Will add Kcl x1 dose.  Follow up renal function and electrolytes.  Further workup with renal US .  Monitor urine output.

## 2024-06-18 NOTE — ED Notes (Signed)
 Pt sitting up and talking with staff.

## 2024-06-18 NOTE — H&P (Signed)
 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 Patient has been working outdoors under extreme heat throughout the morning and into the afternoon. Around 1 pm he noted severe diaphoresis, lower extremity cramps and dizziness. No chest pain or dyspnea. He called his mother and asked her to call EMS. Because of extreme diaphoresis his insulin  pump fell off.   At the time of my examination patient has received about 1000 ml of normal saline IV and his symptoms have been improving, but not completely back to his baseline.    Review of Systems: As mentioned in the history of present illness. All other systems reviewed and are negative. Past Medical History:  Diagnosis Date   ADD (attention deficit disorder)    Bipolar disorder (HCC)    Chronic back pain    Diabetes mellitus    type 1   Narcotic addiction (HCC)    Hx IV heroin abuse   Neuropathy    Past Surgical History:  Procedure Laterality Date   INCISION AND DRAINAGE     MOUTH SURGERY     Social History:  reports that he has been smoking cigarettes. He has a 8.5 pack-year smoking history. He uses smokeless tobacco. He reports current alcohol use. He reports that he does not currently use drugs after having used the following drugs: Methamphetamines and Heroin.  Allergies  Allergen Reactions   Trazodone  And Nefazodone Other (See Comments)    Hallucinations and nightmares    History reviewed. No pertinent family history.  Prior to Admission medications   Medication Sig Start Date End Date Taking? Authorizing  Provider  lamoTRIgine  (LAMICTAL ) 25 MG tablet Take 1 tablet (25 mg total) by mouth daily for 14 days, THEN 2 tablets (50 mg total) daily for 14 days. Patient taking differently: Take 1 tablet (25 mg total) by mouth daily. 05/25/24 06/22/24 Yes Jerrell [name] Ned, MD  NOVOLOG  100 UNIT/ML injection Infuse 200 units of novolog  (2mL) into the Omnipod device every 3 days 05/29/24  Yes Jerrell [name] Ned, MD  Continuous Glucose Receiver (DEXCOM G7 RECEIVER) DEVI 1 Device by Does not apply route continuous. 05/25/24   Jerrell [name] Ned, MD  Continuous Glucose Sensor (DEXCOM G7 SENSOR) MISC Insert a sensor on your arm every 10 days 05/25/24   Jerrell [name] Ned, MD  Insulin  Disposable Pump (OMNIPOD 5 DEXG7G6 INTRO GEN 5) KIT SMARTSIG:SUB-Q Every 3 Days 03/03/24   [provider]    Physical Exam: Vitals:   06/18/24 1458 06/18/24 1501 06/18/24 2007 06/18/24 2020  BP:  136/84 (!) 165/93 (!) 179/97  Pulse:  98 76 100  Resp:  16 16 16   Temp:  98.2 F (36.8 C) 97.7 F (36.5 C) 97.7 F (36.5 C)  TempSrc:   Oral Oral  SpO2:  99% 99% 100%  Weight: 87 kg   81.1 kg  Height: 5' 10 (1.778 m)      BP (!) 179/97 (BP Location: Left Arm)   Pulse 100   Temp 97.7 F (36.5 C) (Oral)   Resp 16   Ht 5' 10 (1.778 m)  Wt 81.1 kg   SpO2 100%   BMI 25.65 kg/m   Neurology awake and alert ENT with no pallor or icterus Cardiovascular with S1 and S2 present and regular with no gallops, rubs or murmurs Respiratory with no rales or wheezing, no rhonchi Abdomen with no distention, non tender  No lower extremity edema No rashes.   Data Reviewed:   Na 139, K 3.4 Cl 103 bicarbonate 19, glucose 57, bun 40 cr 3.58  AST 33 ALT 31  Wbc 16.6 hgb 12.4 plt 393    Assessment and Plan: AKI (acute kidney injury) (HCC) Pre renal renal failure with hypokalemia  Plan to continue IV fluids with isotonic saline at 100 ml per hr Will add Kcl x1 dose.  Follow up renal function and electrolytes.   Further workup with renal US .  Monitor urine output.   Diabetes mellitus type 1 (HCC) Will resume insulin  pump   Bipolar disease, chronic (HCC) Continue lamotrigine .   ADD (attention deficit disorder) Follow up as outpatient.     Advance Care Planning:   Code Status: Full Code   Consults: none  Family Communication: no family at the bedside   Severity of Illness: The appropriate patient status for this patient is OBSERVATION. Observation status is judged to be reasonable and necessary in order to provide the required intensity of service to ensure the patient's safety. The patient's presenting symptoms, physical exam findings, and initial radiographic and laboratory data in the context of their medical condition is felt to place them at decreased risk for further clinical deterioration. Furthermore, it is anticipated that the patient will be medically stable for discharge from the hospital within 2 midnights of admission.   Author: Elidia Toribio Furnace, MD 06/18/2024 9:31 PM  For on call review [email or web address].

## 2024-06-18 NOTE — Assessment & Plan Note (Signed)
Continue lamotrigine 

## 2024-06-18 NOTE — ED Triage Notes (Signed)
 Pt bib [facility]cems. Pt was out in the heat x3 hours and felt like he was having a heat stroke. Pts cbg w/ ems was 60. Pt given 1 packet oral glucose & a 500ml bag of NS.

## 2024-06-19 DIAGNOSIS — E103299 Type 1 diabetes mellitus with mild nonproliferative diabetic retinopathy without macular edema, unspecified eye: Secondary | ICD-10-CM | POA: Diagnosis not present

## 2024-06-19 DIAGNOSIS — N179 Acute kidney failure, unspecified: Secondary | ICD-10-CM | POA: Diagnosis not present

## 2024-06-19 LAB — CBC
HCT: 34.5 % — ABNORMAL LOW (ref 39.0–52.0)
Hemoglobin: 10.1 g/dL — ABNORMAL LOW (ref 13.0–17.0)
MCH: 25.7 pg — ABNORMAL LOW (ref 26.0–34.0)
MCHC: 29.3 g/dL — ABNORMAL LOW (ref 30.0–36.0)
MCV: 87.8 fL (ref 80.0–100.0)
Platelets: 348 K/uL (ref 150–400)
RBC: 3.93 MIL/uL — ABNORMAL LOW (ref 4.22–5.81)
RDW: 14.9 % (ref 11.5–15.5)
WBC: 12.7 K/uL — ABNORMAL HIGH (ref 4.0–10.5)
nRBC: 0 % (ref 0.0–0.2)

## 2024-06-19 LAB — BASIC METABOLIC PANEL WITH GFR
Anion gap: 12 (ref 5–15)
BUN: 35 mg/dL — ABNORMAL HIGH (ref 6–20)
CO2: 20 mmol/L — ABNORMAL LOW (ref 22–32)
Calcium: 9.2 mg/dL (ref 8.9–10.3)
Chloride: 106 mmol/L (ref 98–111)
Creatinine, Ser: 1.85 mg/dL — ABNORMAL HIGH (ref 0.61–1.24)
GFR, Estimated: 45 mL/min — ABNORMAL LOW (ref >60)
Glucose, Bld: 152 mg/dL — ABNORMAL HIGH (ref 70–99)
Potassium: 4.1 mmol/L (ref 3.5–5.1)
Sodium: 138 mmol/L (ref 135–145)

## 2024-06-19 LAB — MAGNESIUM: Magnesium: 2.2 mg/dL (ref 1.7–2.4)

## 2024-06-19 LAB — CK: Total CK: 1017 U/L — ABNORMAL HIGH (ref 49–397)

## 2024-06-19 LAB — HIV ANTIBODY (ROUTINE TESTING W REFLEX): HIV Screen 4th Generation wRfx: NONREACTIVE

## 2024-06-19 LAB — GLUCOSE, CAPILLARY
Glucose-Capillary: 163 mg/dL — ABNORMAL HIGH (ref 70–99)
Glucose-Capillary: 149 mg/dL — ABNORMAL HIGH (ref 70–99)
Glucose-Capillary: 132 mg/dL — ABNORMAL HIGH (ref 70–99)

## 2024-06-19 MED ORDER — ENOXAPARIN SODIUM 40 MG/0.4ML IJ SOSY: 40.0000 mg | PREFILLED_SYRINGE | INTRAMUSCULAR | Status: DC

## 2024-06-19 NOTE — Plan of Care (Signed)

## 2024-06-19 NOTE — Progress Notes (Signed)
   06/19/24 0947  TOC Brief Assessment  Insurance and Status Reviewed  Patient has primary care physician Yes  Home environment has been reviewed From home  Prior level of function: Independent  Prior/Current Home Services No current home services  Social Drivers of Health Review SDOH reviewed interventions complete (smoking cessation added to AVS)  Readmission risk has been reviewed Yes  Transition of care needs no transition of care needs at this time   Transition of Care Department [facility]) has reviewed patient and no TOC needs have been identified at this time. We will continue to monitor patient advancement through interdisciplinary progression rounds. If new patient transition needs arise, please place a TOC consult.

## 2024-06-19 NOTE — Inpatient Diabetes Management (Signed)
 Inpatient Diabetes Program Recommendations  AACE/ADA: New Consensus Statement on Inpatient Glycemic Control  Target Ranges:  Prepandial:   less than 140 mg/dL      Peak postprandial:   less than 180 mg/dL (1-2 hours)      Critically ill patients:  140 - 180 mg/dL    Latest Reference Range & Units 06/18/24 14:54 06/18/24 16:51 06/18/24 17:39 06/19/24 02:29 06/19/24 03:22 06/19/24 07:41  Glucose-Capillary 70 - 99 mg/dL 76 61 (L) 795 (H) 836 (H) 149 (H) 132 (H)   Review of Glycemic Control  Diabetes history: DM1 Outpatient Diabetes medications: OmniPod insulin  pump (basal 12A 0.95 units/hr, 6A 1.05 units/hr; I:CR 1:15 grams, I:SF 1:50 mg/dl) with Novolog  and Dexcom G7 CGM Current orders for Inpatient glycemic control: Insulin  Pump AC&HS and 2am  NOTE: Patient with DM1 presented to hospital heat exposure and admitted with AKI. Per H&P on 7/31, patient's insulin  pump came off due to extreme diaphoresis and initially hypoglycemic; patient was ordered to resume insulin  pump and most current CBG 132 mg/dl this morning. Per chart, patient uses OmniPod insulin  pump with Novolog  insulin  and Dexcom G7. Noted patient seen T. Eckard, RPh on 05/29/24 and per note the following should be insulin  pump settings:  Basal 12A    0.95 units/hr 6A      1.05 units/hr Total Basal: 24.6 units  Insulin  to Carb Ratio: 1:15 grams Insulin  Sensitivity 1:50 mg/dl  Spoke to patient over the phone regarding DM and insulin  pump. Patient confirms that he put a new OmniPod insulin  pump pod back on yesterday around 4pm and he uses Dexcom G7 and auto mode on insulin  pump. Patient states that he thinks the above pump settings are correct. He reports his glucose has been doing better with the insulin  pump using auto mode. Patient states he has everything he needs at home for DM control and that he has an appointment with his PCP on Monday 06/22/24. Patient has no questions or concerns at this time.  Thanks, Earnie Gainer, RN, MSN,  CDCES Diabetes Coordinator Inpatient Diabetes Program [phone] (Team Pager from 8am to 5pm)

## 2024-06-19 NOTE — Discharge Summary (Signed)
 Physician Discharge Summary   Patient: Tyler Mann MRN: [id number] DOB: Jul 10, 1981  Admit date:     06/18/2024  Discharge date: 06/19/24  Discharge Physician: Reyes VEAR Gaw   PCP: Jerrell [name] Ned, MD   Recommendations at discharge:   Patient adamant about leaving today.  Recommended to follow up with PCP for serum creatinine and CK recheck.   Recommended to replace electrolytes over the weekend.    Discharge Diagnoses: Active Problems:   AKI (acute kidney injury) (HCC)   Diabetes mellitus type 1 (HCC)   Bipolar disease, chronic (HCC)   ADD (attention deficit disorder)  Resolved Problems:   * No resolved hospital problems. 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 Patient had been working outdoors under extreme heat throughout the morning and into the afternoon. Around 1 pm he noted severe diaphoresis, lower extremity cramps and dizziness. No chest pain or dyspnea. He called his mother and asked her to call EMS. Because of extreme diaphoresis his insulin  pump fell off. In ER found to have AKI with serum creatinine 3.58.  Also with CK 900s.    Admitted for the same, started on fluid boluses and NS IVF overnight.  On day of discharge, I went to see patient and he was fully dressed in his room, had unhooked his IV, and was pacing the room.  Adamant he was leaving the hospital at that time. Discussed keeping him in house as his CK had risen slightly and although he creatinine had improved, it was not yet normal, however he would not hear of this and said he was leaving.  Because of this, the fact he had full capacity on my exam and could elucidate the issues with going home early and how he would ameliorate these risks with hydration and staying indoors, and the fact he looked so well clinically, he was discharged home with recommendations for  close follow up with PCP on Monday 8/4, appointment already scheduled.    Assessment and Plan: AKI (acute kidney injury) (HCC) Pre renal renal failure with hypokalemia - Creatinine improved to 1.85 on day of discharge, downtrending.   - Not to baseline.  Plan was to keep in house on IVF, but patient had unhooked his IVF and was making ready to leave the hospital - recommended to continue hydrating with oral electrolytes over the weekend and follow up with his PCP on Monday  Elevated CK: - on review of medical records, patient has never had a normal CK except for 1, going back to 2011.   - mostly in 1000s since 2023.  Not on a statin.  Has history of stimulant overuse in past. Last UDS Dec 2024 + amphetamines, UDS not obtained this admission.   - recommend further workup by outpt PCP   Diabetes mellitus type 1 (HCC) Will resume insulin  pump    Bipolar disease, chronic (HCC) Continue lamotrigine .    ADD (attention deficit disorder) Follow up as outpatient.  Not currently prescribed any stimulants        Consultants: none Procedures performed: none  Disposition: Home Diet recommendation:  Discharge Diet Orders (From admission, onward)     Start     Ordered   06/19/24 0000  Diet - low sodium heart healthy        06/19/24 1023           Regular diet DISCHARGE MEDICATION:  Allergies as of 06/19/2024       Reactions   Trazodone  And Nefazodone Other (See Comments)   Hallucinations and nightmares        Medication List     TAKE these medications    Dexcom G7 Receiver Devi 1 Device by Does not apply route continuous.   Dexcom G7 Sensor Misc Insert a sensor on your arm every 10 days   lamoTRIgine  25 MG tablet Commonly known as: LaMICtal  Take 1 tablet (25 mg total) by mouth daily for 14 days, THEN 2 tablets (50 mg total) daily for 14 days. Start taking on: May 25, 2024 What changed: See the new instructions.   NovoLOG  100 UNIT/ML injection Generic drug:  insulin  aspart Infuse 200 units of novolog  (2mL) into the Omnipod device every 3 days   Omnipod 5 DexG7G6 Intro Gen 5 Kit SMARTSIG:SUB-Q Every 3 Days        Discharge Exam: Filed Weights   06/18/24 1458 06/18/24 2020  Weight: 87 kg 81.1 kg   Gen:  Pt up and fully dressed, pacing around room HEENT:  PERRL Heart:  RRR Lungs:  Clear Abd:  S/ND/NT Ext:  No LE edema Neuro:  A and O x 4, no focal deficits Psych:  Clear and linear thought process, speech confluent, mood and affect congruent.    Condition at discharge: good  The results of significant diagnostics from this hospitalization (including imaging, microbiology, ancillary and laboratory) are listed below for reference.   Imaging Studies: No results found.  Microbiology: Results for orders placed or performed during the hospital encounter of 10/16/22  Blood Culture (routine x 2)     Status: None   Collection Time: 10/16/22  1:37 AM   Specimen: BLOOD RIGHT FOREARM  Result Value Ref Range Status   Specimen Description BLOOD RIGHT FOREARM  Final   Special Requests   Final    BOTTLES DRAWN AEROBIC AND ANAEROBIC Blood Culture adequate volume   Culture   Final    NO GROWTH 5 DAYS Performed at [facility], [address] [place], [place] [address]    Report Status 10/21/2022 FINAL  Final  Blood Culture (routine x 2)     Status: None   Collection Time: 10/16/22  2:25 AM   Specimen: BLOOD  Result Value Ref Range Status   Specimen Description BLOOD RIGHT ANTECUBITAL  Final   Special Requests   Final    BOTTLES DRAWN AEROBIC AND ANAEROBIC Blood Culture results may not be optimal due to an excessive volume of blood received in culture bottles   Culture   Final    NO GROWTH 5 DAYS Performed at [facility], [address], [place], [place] [address]    Report Status 10/21/2022 FINAL  Final    Labs: CBC: Recent Labs  Lab 06/18/24 1547 06/19/24 0512  WBC 16.6* 12.7*  NEUTROABS 12.7*  --   HGB 12.4* 10.1*  HCT  37.9* 34.5*  MCV 87.3 87.8  PLT 393 348   Basic Metabolic Panel: Recent Labs  Lab 06/18/24 1547 06/19/24 0512  NA 139 138  K 3.4* 4.1  CL 103 106  CO2 19* 20*  GLUCOSE 57* 152*  BUN 40* 35*  CREATININE 3.58* 1.85*  CALCIUM  10.4* 9.2  MG  --  2.2   Liver Function Tests: Recent Labs  Lab 06/18/24 1547  AST 33  ALT 31  ALKPHOS 122  BILITOT 1.2  PROT 9.0*  ALBUMIN 4.9   CBG: Recent Labs  Lab 06/18/24  1651 06/18/24 1739 06/19/24 0229 06/19/24 0322 06/19/24 0741  GLUCAP 61* 204* 163* 149* 132*    Discharge time spent: less than 30 minutes.  Signed: Reyes VEAR Gaw, MD [facility] 06/19/2024

## 2024-06-22 ENCOUNTER — Ambulatory Visit: Admitting: Student in an Organized Health Care Education/Training Program

## 2024-08-18 ENCOUNTER — Encounter: Payer: Self-pay | Admitting: Student in an Organized Health Care Education/Training Program

## 2024-08-18 ENCOUNTER — Ambulatory Visit: Admitting: Student in an Organized Health Care Education/Training Program

## 2024-08-18 VITALS — BP 160/98 | HR 87 | Wt 184.0 lb

## 2024-08-18 DIAGNOSIS — E1021 Type 1 diabetes mellitus with diabetic nephropathy: Secondary | ICD-10-CM | POA: Diagnosis not present

## 2024-08-18 DIAGNOSIS — E103299 Type 1 diabetes mellitus with mild nonproliferative diabetic retinopathy without macular edema, unspecified eye: Secondary | ICD-10-CM

## 2024-08-18 DIAGNOSIS — F319 Bipolar disorder, unspecified: Secondary | ICD-10-CM

## 2024-08-18 DIAGNOSIS — Z13220 Encounter for screening for lipoid disorders: Secondary | ICD-10-CM | POA: Diagnosis not present

## 2024-08-18 LAB — BASIC METABOLIC PANEL WITH GFR
BUN: 29 mg/dL — ABNORMAL HIGH (ref 6–23)
CO2: 25 meq/L (ref 19–32)
Calcium: 10 mg/dL (ref 8.4–10.5)
Chloride: 99 meq/L (ref 96–112)
Creatinine, Ser: 1.28 mg/dL (ref 0.40–1.50)
GFR: 67.9 mL/min (ref >60.00)
Glucose, Bld: 279 mg/dL — ABNORMAL HIGH (ref 70–99)
Potassium: 4.8 meq/L (ref 3.5–5.1)
Sodium: 137 meq/L (ref 135–145)

## 2024-08-18 LAB — LIPID PANEL
Cholesterol: 338 mg/dL — ABNORMAL HIGH (ref 0–200)
HDL: 70.9 mg/dL (ref >39.00)
LDL Cholesterol: 205 mg/dL — ABNORMAL HIGH (ref 0–99)
NonHDL: 266.97
Total CHOL/HDL Ratio: 5
Triglycerides: 310 mg/dL — ABNORMAL HIGH (ref 0.0–149.0)
VLDL: 62 mg/dL — ABNORMAL HIGH (ref 0.0–40.0)

## 2024-08-18 LAB — MICROALBUMIN / CREATININE URINE RATIO
Creatinine,U: 58.3 mg/dL
Microalb Creat Ratio: 927.9 mg/g — ABNORMAL HIGH (ref 0.0–30.0)
Microalb, Ur: 54.1 mg/dL — ABNORMAL HIGH (ref 0.0–1.9)

## 2024-08-18 MED ORDER — LAMOTRIGINE 200 MG PO TABS: ORAL_TABLET | ORAL | 5 refills | Status: AC

## 2024-08-18 MED ORDER — DEXCOM G7 SENSOR MISC: 5 refills | Status: DC

## 2024-08-18 MED ORDER — INSULIN ASPART 100 UNIT/ML IJ SOLN: INTRAMUSCULAR | 5 refills | Status: DC

## 2024-08-18 MED ORDER — OMNIPOD 5 DEXG7G6 PODS GEN 5 MISC: 1.0000 | 3 refills | Status: AC

## 2024-08-18 NOTE — Progress Notes (Signed)
 Established Patient Office Visit  Subjective   Patient ID: Tyler Mann, male    DOB: May 20, 1981  Age: 43 y.o. MRN: [id number]  Chief Complaint  Patient presents with   Medical Management of Chronic Issues    Follow up from 7/7    HPI  Discussed the use of AI scribe software for clinical note transcription with the patient, who gave verbal consent to proceed.  History of Present Illness Tyler Mann is a 43 year old male with bipolar disorder and diabetes who presents with worsening mental health and diabetes management issues.  He feels mentally unstable, partly due to the grief of losing his daughter a year ago. He has little interest in activities, prefers staying at home, and avoids public interaction. He acknowledges feeling depressed but is uncertain if it is solely due to his bipolar disorder. He has been taking Lamictal , increasing his dose to 200 mg daily over the past three months, which he feels has been better for his mental health, though he is unsure if it is helping. No current use of illicit drugs and he has not been taking any other medications for his bipolar disorder.  His diabetes management has been suboptimal. He has not been using his Omnipod due to a lack of pods and has been administering insulin  via needle injections. He uses between 30 and 60 units of Novolog  daily, often requiring double or triple the amount when using needles compared to the pump. He has not felt well, attributing it to high blood sugar levels, and mentions that his blood sugar has likely been above 200 mg/dL for the past two months. He prefers using the Omnipod and Dexcom for better glucose control.  He has a family history of multiple sclerosis, as his mother has the condition. His father is not in good health due to age-related issues. He describes a challenging family dynamic, where issues are often overlooked rather than addressed, contributing to his stress.  He has a  history of hospitalization in August for a kidney issue and reports that dehydration is a recurring problem for him during hot weather due to insufficient water intake. He has a history of eczema, similar to his father's condition, and has experienced mosquito and bee bites recently.     Objective:     BP (!) 160/98   Pulse 87   Wt 184 lb (83.5 kg)   SpO2 98%   BMI 26.40 kg/m   Physical Exam  Gen: Well-appearing man Psych: Moderately pressured and tangential speech, mildly anxious appearing, not depressed appearing, a little disheveled and animated today Neck: Normal thyroid, no nodules or adenopathy Heart: Regular, no murmur Lungs: Unlabored, clear throughout Ext: Warm, no edema, normal joints Skin: Multiple bites and excoriations on his lower extremities, no underlying rash    Assessment & Plan:    Problem List Items Addressed This Visit       High   Diabetes mellitus type 1 (HCC) (Chronic)   Glycemic control is inadequate with an A1c of 11%. Insurance mandates a switch to Insulin  Aspart. Prescribe Omnipod 5 pods, one every three days, for a month (15 pods total). Infuse 200 units of Insulin  Aspart into the Omnipod every three days. Order blood work to assess kidney function.      Relevant Medications   Insulin  Disposable Pump (OMNIPOD 5 DEXG7G6 PODS GEN 5) MISC   Continuous Glucose Sensor (DEXCOM G7 SENSOR) MISC   insulin  aspart (NOVOLOG ) 100 UNIT/ML injection  Bipolar disease, chronic (HCC) - Primary (Chronic)   He reports depressive symptoms and lack of interest, having increased his Lamotrigine  dose independently. He has not seen psychiatry.  Will continue lamotrigine  200 mg orally daily. Provided psychiatry's contact number and recommend reaching out for further evaluation and management.  I think his control is not good right now of his bipolar disease.  Not ongoing suicidal ideation.  No substance use.  But likely could benefit from adjustment of antipsychotic and  mood stabilizing medications.      Relevant Medications   lamoTRIgine  (LAMICTAL ) 200 MG tablet     Low   Diabetic nephropathy (HCC) (Chronic)   He was hospitalized in August for acute kidney injury due to dehydration. Order blood work to evaluate kidney function.  Check urine microalbumin today.  At risk for developing CKD in the future given his poor diabetes control and frequent AKI insults.      Relevant Medications   insulin  aspart (NOVOLOG ) 100 UNIT/ML injection   Other Relevant Orders   Basic metabolic panel with GFR   Microalbumin / creatinine urine ratio   Other Visit Diagnoses       Screening for lipid disorders       Relevant Orders   Lipid panel       Return in about 3 months (around 11/17/2024).    [name] Debby Specking, MD

## 2024-08-18 NOTE — Patient Instructions (Signed)
  VISIT SUMMARY: You are a 43 year old male with bipolar disorder and diabetes who came in today due to worsening mental health and diabetes management issues. You have been feeling mentally unstable, partly due to the grief of losing your daughter a year ago, and have little interest in activities. Your diabetes management has been suboptimal, and you have not been using your Omnipod due to a lack of pods, resulting in high blood sugar levels. You also have a history of hospitalization for a kidney issue and report recurring dehydration during hot weather.  YOUR PLAN: -TYPE 1 DIABETES MELLITUS WITH POOR GLYCEMIC CONTROL: Type 1 diabetes is a condition where your body does not produce insulin , leading to high blood sugar levels. Your glycemic control is inadequate with an A1c of 11%. You will switch to Insulin  Aspart as mandated by your insurance. You are prescribed Omnipod 5 pods, one every three days, for a month (15 pods total). You should infuse 200 units of Insulin  Aspart into the Omnipod every three days. Blood work will be ordered to assess your kidney function.  -ACUTE KIDNEY INJURY: Acute kidney injury is a sudden episode of kidney failure or damage. You were hospitalized in August for acute kidney injury due to dehydration. Blood work will be ordered to evaluate your kidney function.  -BIPOLAR DISORDER WITH DEPRESSIVE SYMPTOMS: Bipolar disorder is a mental health condition characterized by extreme mood swings. You report depressive symptoms and lack of interest, having increased your Lamotrigine  dose independently. You are prescribed Lamotrigine  200 mg orally daily. You should reach out to psychiatry for further evaluation and management. Their contact number will be provided.  -GENERAL HEALTH MAINTENANCE: You report quitting smoking and suspect eczema, similar to your family history. You are encouraged to stay adequately hydrated, especially in hot weather, to prevent dehydration and potential  kidney issues.  INSTRUCTIONS: Please follow up with psychiatry for further evaluation and management of your bipolar disorder. Blood work will be ordered to assess your kidney function. Ensure you are using the Omnipod as prescribed and infusing 200 units of Insulin  Aspart every three days. Stay hydrated, especially in hot weather, to prevent dehydration.

## 2024-08-18 NOTE — Assessment & Plan Note (Signed)
 He reports depressive symptoms and lack of interest, having increased his Lamotrigine  dose independently. He has not seen psychiatry.  Will continue lamotrigine  200 mg orally daily. Provided psychiatry's contact number and recommend reaching out for further evaluation and management.  I think his control is not good right now of his bipolar disease.  Not ongoing suicidal ideation.  No substance use.  But likely could benefit from adjustment of antipsychotic and mood stabilizing medications.

## 2024-08-18 NOTE — Assessment & Plan Note (Signed)
 Glycemic control is inadequate with an A1c of 11%. Insurance mandates a switch to Insulin  Aspart. Prescribe Omnipod 5 pods, one every three days, for a month (15 pods total). Infuse 200 units of Insulin  Aspart into the Omnipod every three days. Order blood work to assess kidney function.

## 2024-08-18 NOTE — Assessment & Plan Note (Signed)
 He was hospitalized in August for acute kidney injury due to dehydration. Order blood work to evaluate kidney function.  Check urine microalbumin today.  At risk for developing CKD in the future given his poor diabetes control and frequent AKI insults.

## 2024-08-19 ENCOUNTER — Ambulatory Visit: Payer: Self-pay | Admitting: Student in an Organized Health Care Education/Training Program

## 2024-08-19 MED ORDER — ROSUVASTATIN CALCIUM 20 MG PO TABS: 20.0000 mg | ORAL_TABLET | Freq: Every day | ORAL | 3 refills | Status: AC

## 2024-09-03 ENCOUNTER — Ambulatory Visit: Payer: Self-pay | Admitting: Student in an Organized Health Care Education/Training Program

## 2024-09-03 NOTE — Telephone Encounter (Signed)
 The pt told agent to call back after 12 PM as he is going into the courtroom. This RN will put in call back folder.    Copied from CRM [id number]. Topic: Clinical - Red Word Triage >> Sep 03, 2024  8:51 AM Tyler Mann wrote: Red Word that prompted transfer to Nurse Triage: Knees down swollen to triple the size they normally are. Pain every once in a while. (Both legs)

## 2024-09-03 NOTE — Telephone Encounter (Signed)
 FYI - visit with you tomorrow

## 2024-09-03 NOTE — Telephone Encounter (Signed)
 Patient called back-reports bilateral leg swelling to moderate in nature. Patient states his legs became swollen about two days ago after weed eating a yard. Patient endorses mild to no pain pain to his legs, no CP or SOB. Patient scheduled for an acute visit tomorrow with PCP at 4 PM. Verbalized understanding  FYI Only or Action Required?: FYI only for provider.  Patient was last seen in primary care on 08/18/2024 by Jerrell [name] Ned, MD.  Called Nurse Triage reporting Leg Swelling.  Symptoms began 2 days ago.  Interventions attempted: Rest, hydration, or home remedies.  Symptoms are: unchanged.  Triage Disposition: See Physician Within 24 Hours  Patient/caregiver understands and will follow disposition?: Yes  Reason for Disposition  [1] MODERATE leg swelling (e.g., swelling extends up to knees) AND [2] new-onset or getting worse  Answer Assessment - Initial Assessment Questions 1. ONSET: When did the swelling start? (e.g., minutes, hours, days)     Started two days ago 2. LOCATION: What part of the leg is swollen?  Are both legs swollen or just one leg?     From the knees down 3. SEVERITY: How bad is the swelling? (e.g., localized; mild, moderate, severe)     moderate 4. REDNESS: Is there redness or signs of infection?     no 5. PAIN: Is the swelling painful to touch? If Yes, ask: How painful is it?   (Scale 1-10; mild, moderate or severe)     Mild pain 6. FEVER: Do you have a fever? If Yes, ask: What is it, how was it measured, and when did it start?      no 7. CAUSE: What do you think is causing the leg swelling?     Unsure-patient states he was weed eating an area of a yard 8. MEDICAL HISTORY: Do you have a history of blood clots (e.g., DVT), cancer, heart failure, kidney disease, or liver failure?     no 9. RECURRENT SYMPTOM: Have you had leg swelling before? If Yes, ask: When was the last time? What happened that time?     no 10. OTHER  SYMPTOMS: Do you have any other symptoms? (e.g., chest pain, difficulty breathing)       no  Protocols used: Leg Swelling and Edema-A-AH

## 2024-09-04 ENCOUNTER — Encounter: Payer: Self-pay | Admitting: Student in an Organized Health Care Education/Training Program

## 2024-09-04 ENCOUNTER — Ambulatory Visit: Admitting: Student in an Organized Health Care Education/Training Program

## 2024-09-04 VITALS — BP 163/66 | HR 96 | Wt 188.0 lb

## 2024-09-04 DIAGNOSIS — R600 Localized edema: Secondary | ICD-10-CM | POA: Insufficient documentation

## 2024-09-04 DIAGNOSIS — E103299 Type 1 diabetes mellitus with mild nonproliferative diabetic retinopathy without macular edema, unspecified eye: Secondary | ICD-10-CM

## 2024-09-04 NOTE — Patient Instructions (Signed)
  VISIT SUMMARY: Today, we discussed your recent symptoms of swollen feet and loose toenails, as well as your diabetes management and cholesterol levels. We reviewed your current medications and made some recommendations for further tests and adjustments to your treatment plan.  YOUR PLAN: -LOWER EXTREMITY EDEMA: Lower extremity edema means swelling in the lower legs. This can be due to various reasons, including potential kidney issues related to diabetes. We will conduct blood work and a urine test to check your kidney function. In the meantime, you may find relief using compression socks if they are comfortable for you.  -TYPE 1 DIABETES MELLITUS WITH DIABETIC NEPHROPATHY: Type 1 diabetes is a condition where your body does not produce insulin , and diabetic nephropathy is a kidney disease caused by diabetes. We will review your Dexcom data to understand your blood sugar trends and discuss adjusting your insulin  doses to better manage your blood sugar levels.  -DYSLIPIDEMIA: Dyslipidemia means having high levels of cholesterol in your blood. You have started taking medication to lower your cholesterol. Please continue taking your medication as prescribed, and we will monitor your cholesterol levels during your follow-up visits.  INSTRUCTIONS: Please complete the blood work and urine test as soon as possible. Continue wearing compression socks if they are comfortable. We will review your Dexcom data and discuss insulin  adjustments at your next visit. Continue taking your cholesterol medication as prescribed, and we will monitor your lipid levels in follow-up appointments.

## 2024-09-04 NOTE — Assessment & Plan Note (Signed)
 New problem of lower extremity edema in both legs over the last 3 days.  Given his history of uncontrolled diabetes, intermittent AKI's, I think he is at risk for progressive diabetic nephropathy and possibly nephrotic syndrome.  Will check renal function today along with urine protein to creatinine ratio.  Check albumin level.  No history of heart disease.  No JVD on exam.  Does have fine crackles but good functional status.  If the renal exam is normal, we will look for evidence of heart failure.  Check BMP today.  May need echo in the future.  He is functionally doing well so I am going to hold off on starting Lasix for now.  He is only 4 pounds up from when I last saw him a few weeks ago.  I recommended supportive care with compression stockings while we work this up.  If renal function is acceptable, can start a loop diuretic.

## 2024-09-04 NOTE — Progress Notes (Signed)
 Acute Office Visit  Subjective:     Patient ID: Tyler Mann, male    DOB: 06/25/1981, 43 y.o.   MRN: [id number]  Chief Complaint  Patient presents with   Leg Pain    reports bilateral leg swelling to moderate in nature. Patient states his legs became swollen about two days ago after weed eating a yard. Patient endorses mild to no pain pain to his legs, no CP or SOB.  Toe nails starting to try and come off patient states on right foot. Red spots on both legs     HPI  Discussed the use of AI scribe software for clinical note transcription with the patient, who gave verbal consent to proceed.  History of Present Illness Tyler Mann is a 43 year old male with diabetes who presents with swollen feet and loose toenails.  He has experienced swollen feet for the past three to four days, with the left foot initially more affected than the right. At one point, his ankles were as large as his knees. The swelling has improved since yesterday. No previous episodes of leg swelling are noted.  All toenails are either loose or detaching, with liquid leaking from underneath the big toe. He has a history of problematic toenails but nothing to this extent.  He recently weeded his neighbor's yard and took precautions by washing with dish detergent and water due to his sensitivity to poison ivy. He does not have any blisters or significant itching, though he experiences occasional mild itching.  He experiences some shortness of breath, which he attributes to his weight, but does not find it out of the ordinary.  He manages his diabetes with a Dexcom and Omnipod. He reports frequent low blood sugar events, especially at night, and believes his basal insulin  rate might be too high. He experiences significant drops in blood sugar after meals despite taking only two units of insulin  for meals like macaroni and [place] fries. His blood sugar can drop significantly, especially when he is asleep,  and he relies on alerts from his Dexcom to manage these episodes.  He recently started taking cholesterol medication after being informed that his cholesterol was high. He consumes a significant amount of whole milk and butter, which he acknowledges may contribute to his high cholesterol levels.  His weight has fluctuated between 170 and 190 pounds, with a recent increase to 188 pounds from 184 pounds two weeks ago. He notes that his weight typically drops in the summer months.      Objective:    BP (!) 163/66   Pulse 96   Wt 188 lb (85.3 kg)   BMI 26.98 kg/m   Physical Exam  Gen: Well-appearing man Psych: Moderately pressured speech today, thoughts are still organized, lots of hand movements, animated but calm and cooperative Heart: Regular, 2 out of 6 early systolic murmur best heard at the right upper sternal border Lungs: unlabored, mild fine crackles heard at the bilateral bases Ext: 1+ pitting edema in both lower extremities, small amount of weeping from his right great toe, no erythema, no signs of skin or soft tissue infection, normal DP pulses bilaterally, no skin breakdown.      Assessment & Plan:   Problem List Items Addressed This Visit       High   Diabetes mellitus type 1 (HCC) (Chronic)   Chronic and stable.  Uncontrolled.  I reviewed his Dexcom monitor which is showing improvements.  55% of the time he  is in target range, about 30% of time he is high.   He is on OmniPod right now.  Managing it okay but pretty low insight into the process.  I still think the OmniPod with automated doses is the safest way for him to manage his insulin .  Will have him continue working with our pharmacists, follow-up with me in December for recheck of his A1c.  I have also referred him to [facility] for chronic care management.        Unprioritized   Bilateral leg edema - Primary   New problem of lower extremity edema in both legs over the last 3 days.  Given his history of uncontrolled  diabetes, intermittent AKI's, I think he is at risk for progressive diabetic nephropathy and possibly nephrotic syndrome.  Will check renal function today along with urine protein to creatinine ratio.  Check albumin level.  No history of heart disease.  No JVD on exam.  Does have fine crackles but good functional status.  If the renal exam is normal, we will look for evidence of heart failure.  Check BMP today.  May need echo in the future.  He is functionally doing well so I am going to hold off on starting Lasix for now.  He is only 4 pounds up from when I last saw him a few weeks ago.  I recommended supportive care with compression stockings while we work this up.  If renal function is acceptable, can start a loop diuretic.      Relevant Orders   Protein / creatinine ratio, urine   Comprehensive metabolic panel with GFR   Brain natriuretic peptide     [name] Debby Specking, MD

## 2024-09-04 NOTE — Assessment & Plan Note (Signed)
 Chronic and stable.  Uncontrolled.  I reviewed his Dexcom monitor which is showing improvements.  55% of the time he is in target range, about 30% of time he is high.   He is on OmniPod right now.  Managing it okay but pretty low insight into the process.  I still think the OmniPod with automated doses is the safest way for him to manage his insulin .  Will have him continue working with our pharmacists, follow-up with me in December for recheck of his A1c.  I have also referred him to [facility] for chronic care management.

## 2024-09-07 LAB — COMPREHENSIVE METABOLIC PANEL WITH GFR
AG Ratio: 1.1 (calc) (ref 1.0–2.5)
ALT: 51 U/L — ABNORMAL HIGH (ref 9–46)
AST: 39 U/L (ref 10–40)
Albumin: 3.9 g/dL (ref 3.6–5.1)
Alkaline phosphatase (APISO): 170 U/L — ABNORMAL HIGH (ref 36–130)
BUN/Creatinine Ratio: 14 (calc) (ref 6–22)
BUN: 18 mg/dL (ref 7–25)
CO2: 24 mmol/L (ref 20–32)
Calcium: 9.4 mg/dL (ref 8.6–10.3)
Chloride: 105 mmol/L (ref 98–110)
Creat: 1.3 mg/dL — ABNORMAL HIGH (ref 0.60–1.29)
Globulin: 3.4 g/dL (ref 1.9–3.7)
Glucose, Bld: 176 mg/dL — ABNORMAL HIGH (ref 65–99)
Potassium: 4.2 mmol/L (ref 3.5–5.3)
Sodium: 138 mmol/L (ref 135–146)
Total Bilirubin: 0.4 mg/dL (ref 0.2–1.2)
Total Protein: 7.3 g/dL (ref 6.1–8.1)
eGFR: 69 mL/min/1.73m2 (ref >=60)

## 2024-09-07 LAB — EXTRA LAV TOP TUBE

## 2024-09-07 LAB — PROTEIN / CREATININE RATIO, URINE
Creatinine, Urine: 177 mg/dL (ref 20–320)
Protein/Creat Ratio: 1119 mg/g{creat} — ABNORMAL HIGH (ref 25–148)
Protein/Creatinine Ratio: 1.119 mg/mg{creat} — ABNORMAL HIGH (ref 0.025–0.148)
Total Protein, Urine: 198 mg/dL — ABNORMAL HIGH (ref 5–25)

## 2024-09-07 LAB — BRAIN NATRIURETIC PEPTIDE

## 2024-09-08 ENCOUNTER — Ambulatory Visit: Payer: Self-pay | Admitting: Student in an Organized Health Care Education/Training Program

## 2024-09-08 MED ORDER — LOSARTAN POTASSIUM 50 MG PO TABS: 50.0000 mg | ORAL_TABLET | Freq: Every day | ORAL | 2 refills | Status: DC

## 2024-09-08 MED ORDER — FUROSEMIDE 40 MG PO TABS: 40.0000 mg | ORAL_TABLET | Freq: Every day | ORAL | 2 refills | Status: AC

## 2024-09-29 ENCOUNTER — Encounter: Payer: Self-pay | Admitting: Student in an Organized Health Care Education/Training Program

## 2024-09-29 ENCOUNTER — Ambulatory Visit: Admitting: Student in an Organized Health Care Education/Training Program

## 2024-11-18 ENCOUNTER — Ambulatory Visit: Admitting: Student in an Organized Health Care Education/Training Program

## 2024-12-08 ENCOUNTER — Other Ambulatory Visit: Payer: Self-pay | Admitting: Student in an Organized Health Care Education/Training Program

## 2024-12-11 ENCOUNTER — Encounter: Payer: Self-pay | Admitting: Student in an Organized Health Care Education/Training Program

## 2024-12-11 ENCOUNTER — Other Ambulatory Visit (HOSPITAL_COMMUNITY): Payer: Self-pay

## 2024-12-11 ENCOUNTER — Ambulatory Visit: Admitting: Student in an Organized Health Care Education/Training Program

## 2024-12-11 VITALS — BP 152/78 | HR 84 | Temp 98.1°F | Ht 70.0 in | Wt 188.0 lb

## 2024-12-11 DIAGNOSIS — F319 Bipolar disorder, unspecified: Secondary | ICD-10-CM | POA: Diagnosis not present

## 2024-12-11 DIAGNOSIS — R600 Localized edema: Secondary | ICD-10-CM | POA: Diagnosis not present

## 2024-12-11 DIAGNOSIS — E103299 Type 1 diabetes mellitus with mild nonproliferative diabetic retinopathy without macular edema, unspecified eye: Secondary | ICD-10-CM | POA: Diagnosis not present

## 2024-12-11 DIAGNOSIS — I152 Hypertension secondary to endocrine disorders: Secondary | ICD-10-CM

## 2024-12-11 MED ORDER — INSULIN ASPART 100 UNIT/ML IJ SOLN: INTRAMUSCULAR | 3 refills | Status: AC

## 2024-12-11 MED ORDER — DEXCOM G7 RECEIVER DEVI: 1.0000 | 0 refills | Status: DC

## 2024-12-11 MED ORDER — DEXCOM G7 SENSOR MISC: 5 refills | Status: AC

## 2024-12-11 NOTE — Assessment & Plan Note (Signed)
 New issue today of hypertension.  Currently using losartan .  Under a lot of stress.  Having some issues with consistency with taking medications.  Will monitor this, if elevated at next visit as well will talk about increasing the dose of losartan .

## 2024-12-11 NOTE — Assessment & Plan Note (Signed)
 Chronic and stable.  Urine protein to creatinine ratio was subnephrotic.  I think this is still contributing to the lower extremity edema.  It is much improved now that he is on Lasix .  No skin breakdown.  We are still having a lot of trouble controlling his diabetes so he is at risk of progression to nephrotic range proteinuria.

## 2024-12-11 NOTE — Assessment & Plan Note (Signed)
 Chronic and poorly controlled. A1c at 11.1 percent.  Insulin  regimen adjustments were discussed to improve control and prevent complications. He will continue the current insulin  regimen with Omnipod, increase mealtime bolusing, and ensure timely communication with Omnipod about meals. A referral to an endocrinologist is made for further management options. Dexcom sensors are ordered with prior authorization completed. A follow-up is scheduled in three months to reassess blood glucose control.

## 2024-12-11 NOTE — Patient Instructions (Signed)
" °  VISIT SUMMARY: During your visit, we discussed the management of your blood sugar levels and mental health concerns. We reviewed your insulin  regimen, addressed your recent A1c level, and discussed your challenges with diet and stress. We also talked about your mental health and upcoming psychiatric evaluation. Additionally, we addressed your request for Dexcom sensors and the numbness in your right hand.  YOUR PLAN: -TYPE 1 DIABETES MELLITUS WITH MILD NONPROLIFERATIVE DIABETIC RETINOPATHY: Type 1 diabetes is a condition where your body does not produce insulin , leading to high blood sugar levels. Mild nonproliferative diabetic retinopathy is an early stage of diabetic eye disease. We discussed adjusting your insulin  regimen to improve blood sugar control and prevent complications. You will continue using the Omnipod insulin  pump, increase mealtime bolusing, and ensure timely communication with Omnipod about meals. We have ordered Dexcom sensors with prior authorization completed. A referral to an endocrinologist has been made for further management options. Please follow up in three months to reassess your blood glucose control.  -BIPOLAR DISORDER: Bipolar disorder is a mental health condition characterized by extreme mood swings. You reported significant stress and emotional distress. You have a psychiatric evaluation scheduled and will attend an appointment with a psychiatric nurse practitioner on May 21st.  INSTRUCTIONS: Please follow up in three months to reassess your blood glucose control. Attend your psychiatric evaluation appointment with the psychiatric nurse practitioner on May 21st.    Contains text generated by [employer].   "

## 2024-12-11 NOTE — Assessment & Plan Note (Signed)
 He reports significant stress and emotional distress. A psychiatric evaluation is scheduled, and he will attend an appointment with a psychiatric nurse practitioner on May 21st.  He is continuing lamotrigine .  No suicidal thoughts, no features of psychosis.

## 2024-12-11 NOTE — Progress Notes (Signed)
 "  Established Patient Office Visit  Patient ID: Tyler Mann, male    DOB: 1981/04/24  Age: 44 y.o. MRN: [id number] PCP: Jerrell [name] Ned, MD  Chief Complaint  Patient presents with   Follow-up    Patient states nothing     Subjective:     HPI  Discussed the use of AI scribe software for clinical note transcription with the patient, who gave verbal consent to proceed.  History of Present Illness Tyler Mann is a 44 year old male with diabetes who presents for management of blood sugar levels and mental health concerns.  He experiences frequent hyperglycemia, particularly during periods of stress. He uses Novolog  with meals, adjusting the dose based on food intake, and is currently using the Omnipod insulin  pump, estimating about 175 units every three days. He often forgets to input meal information into the Omnipod, leading to reactive bolusing and occasional hypoglycemic episodes, which he attributes to insufficient food intake. His recent A1c level was 11.1. He finds it challenging to manage his diet due to financial constraints, stating 'it's hard to eat a five dollar meal with no carbs.'  He is currently taking Lamictal , losartan , and cholesterol medication. He has requested a refill for his Dexcom sensors, which require prior authorization due to insurance issues.  He discusses mental health concerns, feeling overwhelmed and expressing a desire to 'lock myself in the closet and throw away my phone.' He has an upcoming appointment with a psychiatric nurse practitioner, Larita Ruder, on May 21st.  He reports numbness in his right hand, which he attributes to carpal tunnel syndrome, noting it worsens in cold weather.    Objective:     BP (!) 152/78   Pulse 84   Temp 98.1 F (36.7 C) (Oral)   Ht 5' 10 (1.778 m)   Wt 188 lb (85.3 kg)   SpO2 98%   BMI 26.98 kg/m   Physical Exam  Gen: Well-appearing man Neck: Normal thyroid, no nodules or  adenopathy Heart: Regular, no murmur Lungs: Unlabored, clear throughout Ext: Warm, trace pitting edema in both lower extremities, much improved from last visit     Assessment & Plan:   Problem List Items Addressed This Visit       High   Diabetes mellitus type 1 (HCC) (Chronic)   Chronic and poorly controlled. A1c at 11.1 percent.  Insulin  regimen adjustments were discussed to improve control and prevent complications. He will continue the current insulin  regimen with Omnipod, increase mealtime bolusing, and ensure timely communication with Omnipod about meals. A referral to an endocrinologist is made for further management options. Dexcom sensors are ordered with prior authorization completed. A follow-up is scheduled in three months to reassess blood glucose control.      Relevant Medications   Continuous Glucose Sensor (DEXCOM G7 SENSOR) MISC   insulin  aspart (NOVOLOG ) 100 UNIT/ML injection   Other Relevant Orders   Ambulatory referral to Endocrinology   Bipolar disease, chronic (HCC) - Primary (Chronic)   He reports significant stress and emotional distress. A psychiatric evaluation is scheduled, and he will attend an appointment with a psychiatric nurse practitioner on May 21st.  He is continuing lamotrigine .  No suicidal thoughts, no features of psychosis.        Medium    Hypertension associated with diabetes (HCC) (Chronic)   New issue today of hypertension.  Currently using losartan .  Under a lot of stress.  Having some issues with consistency with taking medications.  Will  monitor this, if elevated at next visit as well will talk about increasing the dose of losartan .      Relevant Medications   insulin  aspart (NOVOLOG ) 100 UNIT/ML injection     Low   Bilateral leg edema (Chronic)   Chronic and stable.  Urine protein to creatinine ratio was subnephrotic.  I think this is still contributing to the lower extremity edema.  It is much improved now that he is on Lasix .  No  skin breakdown.  We are still having a lot of trouble controlling his diabetes so he is at risk of progression to nephrotic range proteinuria.        Return in about 3 months (around 03/11/2025).    [name] Debby Specking, MD [facility]   "

## 2024-12-14 ENCOUNTER — Telehealth: Payer: Self-pay

## 2024-12-14 ENCOUNTER — Other Ambulatory Visit (HOSPITAL_COMMUNITY): Payer: Self-pay

## 2024-12-14 NOTE — Telephone Encounter (Signed)
 Pharmacy Patient Advocate Encounter   Received notification from Physician's Office that prior authorization for Dexcom G7 Sensor is required/requested.   Insurance verification completed.   The patient is insured through [facility] MEDICAID.   Per test claim: The current 30 day co-pay is, $0.00.  No PA needed at this time. This test claim was processed through [facility]- copay amounts may vary at other pharmacies due to pharmacy/plan contracts, or as the patient moves through the different stages of their insurance plan.

## 2024-12-22 ENCOUNTER — Telehealth: Payer: Self-pay

## 2024-12-22 NOTE — Telephone Encounter (Signed)
 Okay to send in syringes under your name?    Copied from CRM #[id number]. Topic: Clinical - Medication Refill >> Dec 21, 2024  4:51 PM Alfonso ORN wrote: Medication: BD INSULIN  SYRINGE ULTRAFINE 31G X 5/16 1 ML MISC  Has the patient contacted their pharmacy? Yes   This is the patient's preferred pharmacy:  [id number] - [place], [place] - [address] [place] [address] Phone: [phone] Fax: [phone]  Is this the correct pharmacy for this prescription? Yes If no, delete pharmacy and type the correct one.   Has the prescription been filled recently? Yes  Is the patient out of the medication? Yes  Has the patient been seen for an appointment in the last year OR does the patient have an upcoming appointment? Yes  Can we respond through MyChart? no

## 2024-12-22 NOTE — Telephone Encounter (Signed)
 Update on PA?

## 2024-12-22 NOTE — Telephone Encounter (Signed)
 Yes.  Thank you.

## 2024-12-23 ENCOUNTER — Telehealth: Payer: Self-pay

## 2024-12-23 ENCOUNTER — Other Ambulatory Visit (HOSPITAL_COMMUNITY): Payer: Self-pay

## 2024-12-23 DIAGNOSIS — G9332 Myalgic encephalomyelitis/chronic fatigue syndrome: Secondary | ICD-10-CM | POA: Insufficient documentation

## 2024-12-23 MED ORDER — "BD INSULIN SYRINGE ULTRAFINE 31G X 5/16"" 1 ML MISC": 2 refills | Status: AC

## 2024-12-23 NOTE — Telephone Encounter (Signed)
 Called patient and he said he doesn't need a PA because one was already approved. Patient stated [facility] is not giving him his full rx. I tried to call pharmacy and there was no answer due to meal break

## 2024-12-23 NOTE — Addendum Note (Signed)
 Addended by: Mechelle Pates K on: 12/23/2024 08:29 AM   Modules accepted: Orders

## 2024-12-23 NOTE — Telephone Encounter (Signed)
 Pharmacy Patient Advocate Encounter   Received notification from Pt Calls Messages that prior authorization for Dexcom G7 15 Day Sensor is required/requested.   Insurance verification completed.   The patient is insured through [facility] MEDICAID.   Per test claim: PA required; PA submitted to above mentioned insurance via Latent Key/confirmation #/EOC [id number] Status is pending

## 2024-12-23 NOTE — Telephone Encounter (Signed)
 Called pharmacy and they are saying PA expired 11/26/24. A new one is needed for patient to get medication  Called patient to let him know. Lvmtrc

## 2024-12-24 ENCOUNTER — Telehealth: Payer: Self-pay

## 2024-12-24 ENCOUNTER — Other Ambulatory Visit (HOSPITAL_COMMUNITY): Payer: Self-pay

## 2024-12-24 NOTE — Telephone Encounter (Signed)
 Pharmacy Patient Advocate Encounter  Received notification from [facility] MEDICAID that Prior Authorization for Dexcom G7 15 Day Sensor  has been APPROVED from 12/23/24 to 12/23/25. Ran test claim, Copay is $0.00. This test claim was processed through [facility]- copay amounts may vary at other pharmacies due to pharmacy/plan contracts, or as the patient moves through the different stages of their insurance plan.   PA #/Case ID/Reference #:[id number]

## 2024-12-24 NOTE — Telephone Encounter (Signed)
 I tried to call patients pharmacy to see if they got PA approval. Pharmacy was closed. Will try again later

## 2024-12-24 NOTE — Telephone Encounter (Signed)
 Copied from CRM #[id number]. Topic: Clinical - Medication Prior Auth >> Dec 21, 2024  4:54 PM Alfonso ORN wrote: Reason for CRM: pt was told he needs PA for  Continuous Glucose Sensor (DEXCOM G7 SENSOR) MISC  again as he was unable to pick it up in time due to the weather. >> Dec 24, 2024  2:55 PM Sophia H wrote: Returned missed call from clinic - advised per chart note  Called patients pharmacy to see if they got PA approval. They did. They do not have the Dexcom G7 sensors in stock. They will order them and they should be there tomorrow. I called patient to let him know. Lvmtrc. If patient calls back, please relay message.     Patient verbalized understanding. Patient was questioning why a prior authorization was needed to begin with.

## 2024-12-24 NOTE — Telephone Encounter (Signed)
 Called patients pharmacy to see if they got PA approval. They did. They do not have the Dexcom G7 sensors in stock. They will order them and they should be there tomorrow. I called patient to let him know. Lvmtrc. If patient calls back, please relay message.

## 2025-03-09 ENCOUNTER — Ambulatory Visit: Admitting: Student in an Organized Health Care Education/Training Program

## 2025-04-08 ENCOUNTER — Ambulatory Visit (HOSPITAL_COMMUNITY): Admitting: Registered Nurse
